# Patient Record
Sex: Female | Born: 1990 | Race: White | Hispanic: No | Marital: Married | Smoking: Former smoker
Health system: Southern US, Community
[De-identification: ages and names within clinical notes are randomized; demographics above are authoritative.]

## PROBLEM LIST (undated history)

## (undated) ENCOUNTER — Inpatient Hospital Stay (HOSPITAL_COMMUNITY): Payer: Self-pay

## (undated) ENCOUNTER — Inpatient Hospital Stay (HOSPITAL_COMMUNITY): Payer: BLUE CROSS/BLUE SHIELD

## (undated) DIAGNOSIS — E282 Polycystic ovarian syndrome: Secondary | ICD-10-CM

## (undated) DIAGNOSIS — Z9289 Personal history of other medical treatment: Secondary | ICD-10-CM

## (undated) DIAGNOSIS — L309 Dermatitis, unspecified: Secondary | ICD-10-CM

## (undated) DIAGNOSIS — D649 Anemia, unspecified: Secondary | ICD-10-CM

## (undated) DIAGNOSIS — K224 Dyskinesia of esophagus: Secondary | ICD-10-CM

## (undated) DIAGNOSIS — R140 Abdominal distension (gaseous): Secondary | ICD-10-CM

## (undated) DIAGNOSIS — K219 Gastro-esophageal reflux disease without esophagitis: Secondary | ICD-10-CM

## (undated) DIAGNOSIS — O1490 Unspecified pre-eclampsia, unspecified trimester: Secondary | ICD-10-CM

## (undated) DIAGNOSIS — J45909 Unspecified asthma, uncomplicated: Secondary | ICD-10-CM

## (undated) DIAGNOSIS — J302 Other seasonal allergic rhinitis: Secondary | ICD-10-CM

## (undated) HISTORY — DX: Gastro-esophageal reflux disease without esophagitis: K21.9

## (undated) HISTORY — PX: WISDOM TOOTH EXTRACTION: SHX21

## (undated) HISTORY — DX: Unspecified pre-eclampsia, unspecified trimester: O14.90

## (undated) HISTORY — DX: Other seasonal allergic rhinitis: J30.2

## (undated) HISTORY — DX: Personal history of other medical treatment: Z92.89

## (undated) HISTORY — PX: TYMPANOSTOMY TUBE PLACEMENT: SHX32

## (undated) HISTORY — DX: Dermatitis, unspecified: L30.9

## (undated) HISTORY — DX: Anemia, unspecified: D64.9

## (undated) HISTORY — DX: Dyskinesia of esophagus: K22.4

## (undated) HISTORY — DX: Abdominal distension (gaseous): R14.0

## (undated) HISTORY — DX: Unspecified asthma, uncomplicated: J45.909

---
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
## 2010-01-23 ENCOUNTER — Encounter: Admission: RE | Admit: 2010-01-23 | Discharge: 2010-01-23 | Payer: Self-pay | Admitting: General Surgery

## 2011-01-13 ENCOUNTER — Telehealth: Payer: Self-pay | Admitting: Family Medicine

## 2011-02-02 NOTE — Telephone Encounter (Signed)
error 

## 2011-02-11 ENCOUNTER — Encounter (INDEPENDENT_AMBULATORY_CARE_PROVIDER_SITE_OTHER): Payer: Self-pay

## 2011-04-13 ENCOUNTER — Ambulatory Visit (INDEPENDENT_AMBULATORY_CARE_PROVIDER_SITE_OTHER): Payer: Self-pay | Admitting: Internal Medicine

## 2011-07-01 ENCOUNTER — Encounter (INDEPENDENT_AMBULATORY_CARE_PROVIDER_SITE_OTHER): Payer: Self-pay | Admitting: Internal Medicine

## 2011-07-01 ENCOUNTER — Ambulatory Visit (INDEPENDENT_AMBULATORY_CARE_PROVIDER_SITE_OTHER): Payer: Medicaid Other | Admitting: Internal Medicine

## 2011-07-01 DIAGNOSIS — R1013 Epigastric pain: Secondary | ICD-10-CM

## 2011-07-01 DIAGNOSIS — J45909 Unspecified asthma, uncomplicated: Secondary | ICD-10-CM | POA: Insufficient documentation

## 2011-07-01 DIAGNOSIS — K219 Gastro-esophageal reflux disease without esophagitis: Secondary | ICD-10-CM | POA: Insufficient documentation

## 2011-07-01 DIAGNOSIS — J40 Bronchitis, not specified as acute or chronic: Secondary | ICD-10-CM

## 2011-07-01 NOTE — Progress Notes (Signed)
Subjective:     Patient ID: Mckenzie Cooley, female   DOB: December 18, 1990, 20 y.o.   MRN: [id number]  HPIShe c/o epigastric pain since December. She thinks it is her hernia. She tells me she hurt spontaneously.  Pain is not related to food. Appetite is good.  Weight loss of 6 pounds. No other pain. She has a BM about 1 a day. No melena or bright rectal bleeding. Her last EGD was in  11/2009 which reveal normal EGD.   Review of Systems see hpi     No current outpatient prescriptions on file.    Objective:   Physical Exam Filed Vitals:   07/01/11 1113  Height: 5' (1.524 m)  Weight: 221 lb 6.4 oz (100.426 kg)   Past Medical History  Diagnosis Date  . Acid reflux   . Bloating   . Weight loss, abnormal   . Asthma    Past Surgical History  Procedure Date  . Upper gastrointestinal endoscopy 12/05/2009  . Upper gastrointestinal endoscopy 02/07/2009  . Cholecystectomy    History   Social History  . Marital Status: Single    Spouse Name: N/A    Number of Children: N/A  . Years of Education: N/A   Occupational History  . Not on file.   Social History Main Topics  . Smoking status: Current Everyday Smoker  . Smokeless tobacco: Not on file   Comment: 1 pack a day  . Alcohol Use: No  . Drug Use: No  . Sexually Active: Not on file   Other Topics Concern  . Not on file   Social History Narrative  . No narrative on file   Family Status  Relation Status Death Age  . Mother Alive     good health  . Father Alive     good health  . Sister Alive     good health  . Brother Alive     good heatlh   Allergies  Allergen Reactions  . Percocet (Oxycodone-Acetaminophen)     Alert and oriented. Skin warm and dry. Oral mucosa is moist.   . Sclera anicteric, conjunctivae is pink. Thyroid not enlarged. No cervical lymphadenopathy. Lungs clear. Heart regular rate and rhythm.  Abdomen is soft. Bowel sounds are positive. No hepatomegaly. No abdominal masses felt. No tenderness.  No edema  to lower extremities. Patient is alert and oriented.      Assessment:    Epigastric pain not related to food intake.       Plan:   Weight loss. Prilosec OTC

## 2011-07-01 NOTE — Patient Instructions (Signed)
Weight loss. Prilosec OTC as needed. OV prn.

## 2012-10-26 ENCOUNTER — Emergency Department (HOSPITAL_COMMUNITY)
Admission: EM | Admit: 2012-10-26 | Discharge: 2012-10-26 | Disposition: A | Discharge status: Home / Self Care | Payer: Self-pay | Attending: Emergency Medicine | Admitting: Emergency Medicine

## 2012-10-26 ENCOUNTER — Emergency Department (HOSPITAL_COMMUNITY): Payer: Self-pay

## 2012-10-26 ENCOUNTER — Encounter (HOSPITAL_COMMUNITY): Payer: Self-pay | Admitting: *Deleted

## 2012-10-26 DIAGNOSIS — J45909 Unspecified asthma, uncomplicated: Secondary | ICD-10-CM | POA: Insufficient documentation

## 2012-10-26 DIAGNOSIS — F17200 Nicotine dependence, unspecified, uncomplicated: Secondary | ICD-10-CM | POA: Insufficient documentation

## 2012-10-26 DIAGNOSIS — M545 Low back pain, unspecified: Secondary | ICD-10-CM | POA: Insufficient documentation

## 2012-10-26 DIAGNOSIS — N12 Tubulo-interstitial nephritis, not specified as acute or chronic: Secondary | ICD-10-CM | POA: Insufficient documentation

## 2012-10-26 DIAGNOSIS — Z8742 Personal history of other diseases of the female genital tract: Secondary | ICD-10-CM | POA: Insufficient documentation

## 2012-10-26 DIAGNOSIS — N949 Unspecified condition associated with female genital organs and menstrual cycle: Secondary | ICD-10-CM | POA: Insufficient documentation

## 2012-10-26 DIAGNOSIS — Z8719 Personal history of other diseases of the digestive system: Secondary | ICD-10-CM | POA: Insufficient documentation

## 2012-10-26 DIAGNOSIS — Z3202 Encounter for pregnancy test, result negative: Secondary | ICD-10-CM | POA: Insufficient documentation

## 2012-10-26 DIAGNOSIS — R300 Dysuria: Secondary | ICD-10-CM | POA: Insufficient documentation

## 2012-10-26 DIAGNOSIS — R110 Nausea: Secondary | ICD-10-CM | POA: Insufficient documentation

## 2012-10-26 HISTORY — DX: Polycystic ovarian syndrome: E28.2

## 2012-10-26 LAB — URINALYSIS, ROUTINE W REFLEX MICROSCOPIC
Bilirubin Urine: NEGATIVE
Glucose, UA: NEGATIVE mg/dL
Ketones, ur: NEGATIVE mg/dL
pH: 7 (ref 5.0–8.0)

## 2012-10-26 LAB — URINE MICROSCOPIC-ADD ON

## 2012-10-26 LAB — PREGNANCY, URINE: Preg Test, Ur: NEGATIVE

## 2012-10-26 LAB — WET PREP, GENITAL: Trich, Wet Prep: NONE SEEN

## 2012-10-26 MED ORDER — ONDANSETRON 4 MG PO TBDP
4.0000 mg | ORAL_TABLET | Freq: Once | ORAL | Status: AC
  Administered 2012-10-26: 4 mg via ORAL
  Filled 2012-10-26: qty 1

## 2012-10-26 MED ORDER — HYDROCODONE-ACETAMINOPHEN 5-325 MG PO TABS: 2.0000 | ORAL_TABLET | ORAL | Status: DC | PRN

## 2012-10-26 MED ORDER — HYDROCODONE-ACETAMINOPHEN 5-325 MG PO TABS
2.0000 | ORAL_TABLET | Freq: Once | ORAL | Status: AC
  Administered 2012-10-26: 2 via ORAL
  Filled 2012-10-26: qty 2

## 2012-10-26 MED ORDER — CEFTRIAXONE SODIUM 1 G IJ SOLR
1.0000 g | Freq: Once | INTRAMUSCULAR | Status: AC
  Administered 2012-10-26: 1 g via INTRAMUSCULAR
  Filled 2012-10-26: qty 10

## 2012-10-26 MED ORDER — CEPHALEXIN 500 MG PO CAPS: 500.0000 mg | ORAL_CAPSULE | Freq: Four times a day (QID) | ORAL | Status: DC

## 2012-10-26 MED ORDER — ONDANSETRON HCL 4 MG PO TABS: 4.0000 mg | ORAL_TABLET | Freq: Four times a day (QID) | ORAL | Status: DC

## 2012-10-26 NOTE — ED Provider Notes (Signed)
History     CSN: [id number]  Arrival date & time 10/26/12  0100   First MD Initiated Contact with Patient 10/26/12 0126      Chief Complaint  Patient presents with  . Flank Pain    (Consider location/radiation/quality/duration/timing/severity/associated sxs/prior treatment) HPI Comments: Patient presents with left flank pain has been constant since yesterday. It radiates to the left side of her abdomen she is diffusely. Denies any nausea or vomiting. Denies any fever. Admits to some vaginal discharge but no bleeding. States last normal period was in March. Denies any history of kidney stones. No change in bowel habits. Good by mouth intake and urine output. Pain is worse with standing and better with lying down.  The history is provided by the patient.    Past Medical History  Diagnosis Date  . Acid reflux   . Bloating   . Weight loss, abnormal   . Asthma   . PCOS (polycystic ovarian syndrome)     Past Surgical History  Procedure Laterality Date  . Upper gastrointestinal endoscopy  12/05/2009  . Upper gastrointestinal endoscopy  02/07/2009  . Cholecystectomy      History reviewed. No pertinent family history.  History  Substance Use Topics  . Smoking status: Current Every Day Smoker -- 1.00 packs/day    Types: Cigarettes  . Smokeless tobacco: Not on file     Comment: 1 pack a day  . Alcohol Use: No    OB History   Grav Para Term Preterm Abortions TAB SAB Ect Mult Living                  Review of Systems  Constitutional: Negative for fever, activity change and appetite change.  HENT: Negative for congestion and rhinorrhea.   Respiratory: Negative for cough, chest tightness and shortness of breath.   Cardiovascular: Negative for chest pain.  Gastrointestinal: Positive for nausea and abdominal pain. Negative for vomiting, diarrhea and constipation.  Genitourinary: Positive for dysuria, flank pain and vaginal discharge. Negative for hematuria and vaginal  bleeding.  Musculoskeletal: Positive for back pain.  Skin: Negative for wound.  Neurological: Negative for dizziness, weakness and headaches.  A complete 10 system review of systems was obtained and all systems are negative except as noted in the HPI and PMH.    Allergies  Percocet  Home Medications   Current Outpatient Rx  Name  Route  Sig  Dispense  Refill  . cephALEXin (KEFLEX) 500 MG capsule   Oral   Take 1 capsule (500 mg total) by mouth 4 (four) times daily.   40 capsule   0   . HYDROcodone-acetaminophen (NORCO/VICODIN) 5-325 MG per tablet   Oral   Take 2 tablets by mouth every 4 (four) hours as needed for pain.   10 tablet   0   . ondansetron (ZOFRAN) 4 MG tablet   Oral   Take 1 tablet (4 mg total) by mouth every 6 (six) hours.   12 tablet   0     BP 132/75  Pulse 83  Temp(Src) 98.1 F (36.7 C) (Oral)  Resp 20  Ht 5' (1.524 m)  Wt 210 lb (95.255 kg)  BMI 41.01 kg/m2  SpO2 99%  LMP 09/19/2012  Physical Exam  Constitutional: She is oriented to person, place, and time. She appears well-developed and well-nourished. No distress.  HENT:  Head: Normocephalic and atraumatic.  Mouth/Throat: Oropharynx is clear and moist. No oropharyngeal exudate.  Eyes: Conjunctivae and EOM are normal. Pupils are  equal, round, and reactive to light.  Neck: Normal range of motion. Neck supple.  Cardiovascular: Normal rate, regular rhythm and normal heart sounds.   No murmur heard. Pulmonary/Chest: Effort normal and breath sounds normal. No respiratory distress.  Abdominal: Soft. There is tenderness.  Mild left lower quadrant pain without guarding or rebound  Genitourinary: Vaginal discharge found.  Scant white vaginal discharge. No cervical motion tenderness, no lateralizing adnexal tenderness  Musculoskeletal: Normal range of motion. She exhibits tenderness. She exhibits no edema.  Left CVA tenderness  Neurological: She is alert and oriented to person, place, and time. No  cranial nerve deficit. Coordination normal.  Skin: Skin is warm.    ED Course  Procedures (including critical care time)  Labs Reviewed  URINALYSIS, ROUTINE W REFLEX MICROSCOPIC - Abnormal; Notable for the following:    Hgb urine dipstick MODERATE (*)    Protein, ur TRACE (*)    Nitrite POSITIVE (*)    Leukocytes, UA SMALL (*)    All other components within normal limits  URINE MICROSCOPIC-ADD ON - Abnormal; Notable for the following:    Squamous Epithelial / LPF MANY (*)    Bacteria, UA MANY (*)    All other components within normal limits  WET PREP, GENITAL  GC/CHLAMYDIA PROBE AMP  URINE CULTURE  PREGNANCY, URINE   Ct Abdomen Pelvis Wo Contrast  10/26/2012  *RADIOLOGY REPORT*  Clinical Data: 22 year old female with flank pain on the left. Negative urine pregnancy test.  CT ABDOMEN AND PELVIS WITHOUT CONTRAST  Technique:  Multidetector CT imaging of the abdomen and pelvis was performed following the standard protocol without intravenous contrast.  Comparison: [facility] CT abdomen and pelvis  04/24/2012.  Findings: Negative lung bases. No acute osseous abnormality identified.  In the left posterior flank soft tissues there is abnormal stranding and gas (series 2 image 51).  This is in the subcutaneous fat.  Query recent penetrating trauma or injection at this site. Other visualized superficial soft tissues appear within normal limits.  No pelvic free fluid.  Negative noncontrast uterus and adnexa. Decompressed distal colon.  Negative left colon.  Negative transverse and right colon.  Normal appendix.  No dilated small bowel.  Negative noncontrast stomach, duodenum, liver, spleen, pancreas and adrenal glands. Gallbladder is surgically absent.  Negative right kidney.  Negative course of the right ureter.  No left hydronephrosis, perinephric stranding or nephrolithiasis. Negative course of the left ureter.  The bladder is decompressed. Small left perimetrial phleboliths are stable.  No  abdominal free fluid.  IMPRESSION: 1.  Stranding and gas in the left flank subcutaneous soft tissues. Query recent penetrating trauma or injection. 2.  No urologic calculus or obstructive uropathy.  No acute or inflammatory findings identified within the abdomen or pelvis.   Original Report Authenticated By: Erskine Speed, M.D.      1. Pyelonephritis       MDM  Left flank pain for the past one day with urinary symptoms. Vitals stable. Abdomen soft without peritoneal signs.  Pelvic exam benign. Urinalysis positive for infection. Patient treated for pyelonephritis with IM Rocephin.  No evidence of kidney stone. Appendix normal.  Tolerating by mouth in ED without vomiting. We'll treat pyelonephritis with antibiotics, pain medications. Culture sent. Return precautions discussed.    Glynn Octave, MD 10/26/12 [phone]

## 2012-10-26 NOTE — ED Notes (Signed)
Pt reporting pain in left flank.  States that she thinks it may be a kidney stone.  Pt has no history of prior stones.  Reporting difficulty with urination, no relief from taking AZO for 2 days.

## 2012-10-27 LAB — GC/CHLAMYDIA PROBE AMP
CT Probe RNA: NEGATIVE
GC Probe RNA: NEGATIVE

## 2012-10-28 LAB — URINE CULTURE

## 2012-10-29 ENCOUNTER — Telehealth (HOSPITAL_COMMUNITY): Payer: Self-pay | Admitting: Emergency Medicine

## 2012-10-29 NOTE — ED Notes (Signed)
Post ED Visit - Positive Culture Follow-up  Culture report reviewed by antimicrobial stewardship pharmacist: []  Wes Dulaney, Pharm.D., BCPS []  [name], Pharm.D., BCPS [x]  [name], Pharm.D., BCPS []  [place], [place].D., BCPS, AAHIVP []  [name], Pharm.D., BCPS, AAHIV  Positive urine culture Treated with Keflex, organism sensitive to the same and no further patient follow-up is required at this time.  Mckenzie Cooley 10/29/2012, 5:23 PM

## 2013-03-15 ENCOUNTER — Emergency Department (HOSPITAL_COMMUNITY): Payer: Medicaid Other

## 2013-03-15 ENCOUNTER — Encounter (HOSPITAL_COMMUNITY): Payer: Self-pay | Admitting: Emergency Medicine

## 2013-03-15 ENCOUNTER — Emergency Department (HOSPITAL_COMMUNITY)
Admission: EM | Admit: 2013-03-15 | Discharge: 2013-03-15 | Disposition: A | Discharge status: Home / Self Care | Payer: Medicaid Other | Attending: Emergency Medicine | Admitting: Emergency Medicine

## 2013-03-15 DIAGNOSIS — J209 Acute bronchitis, unspecified: Secondary | ICD-10-CM

## 2013-03-15 DIAGNOSIS — J3489 Other specified disorders of nose and nasal sinuses: Secondary | ICD-10-CM | POA: Insufficient documentation

## 2013-03-15 DIAGNOSIS — Z79899 Other long term (current) drug therapy: Secondary | ICD-10-CM | POA: Insufficient documentation

## 2013-03-15 DIAGNOSIS — Z8639 Personal history of other endocrine, nutritional and metabolic disease: Secondary | ICD-10-CM | POA: Insufficient documentation

## 2013-03-15 DIAGNOSIS — F17200 Nicotine dependence, unspecified, uncomplicated: Secondary | ICD-10-CM | POA: Insufficient documentation

## 2013-03-15 DIAGNOSIS — IMO0002 Reserved for concepts with insufficient information to code with codable children: Secondary | ICD-10-CM | POA: Insufficient documentation

## 2013-03-15 DIAGNOSIS — Z862 Personal history of diseases of the blood and blood-forming organs and certain disorders involving the immune mechanism: Secondary | ICD-10-CM | POA: Insufficient documentation

## 2013-03-15 DIAGNOSIS — Z3202 Encounter for pregnancy test, result negative: Secondary | ICD-10-CM | POA: Insufficient documentation

## 2013-03-15 DIAGNOSIS — Z8719 Personal history of other diseases of the digestive system: Secondary | ICD-10-CM | POA: Insufficient documentation

## 2013-03-15 DIAGNOSIS — J45901 Unspecified asthma with (acute) exacerbation: Secondary | ICD-10-CM | POA: Insufficient documentation

## 2013-03-15 MED ORDER — ALBUTEROL SULFATE HFA 108 (90 BASE) MCG/ACT IN AERS: 1.0000 | INHALATION_SPRAY | Freq: Four times a day (QID) | RESPIRATORY_TRACT | Status: DC | PRN

## 2013-03-15 MED ORDER — LORATADINE-PSEUDOEPHEDRINE ER 10-240 MG PO TB24: 1.0000 | ORAL_TABLET | Freq: Every day | ORAL | Status: DC

## 2013-03-15 MED ORDER — ALBUTEROL SULFATE (5 MG/ML) 0.5% IN NEBU
2.5000 mg | INHALATION_SOLUTION | Freq: Once | RESPIRATORY_TRACT | Status: AC
  Administered 2013-03-15: 2.5 mg via RESPIRATORY_TRACT
  Filled 2013-03-15: qty 0.5

## 2013-03-15 MED ORDER — PREDNISONE 10 MG PO TABS: ORAL_TABLET | ORAL | Status: DC

## 2013-03-15 NOTE — ED Notes (Signed)
Pt c/o cough, onset 3 days ago. Productive cough with greenish, thick sputum. Pt reports onset of wheezing 2 days ago.

## 2013-03-18 NOTE — ED Provider Notes (Signed)
CSN: [id number]     Arrival date & time 03/15/13  1127 History   First MD Initiated Contact with Patient 03/15/13 1146     Chief Complaint  Patient presents with  . Cough   (Consider location/radiation/quality/duration/timing/severity/associated sxs/prior Treatment) HPI Comments: Mckenzie Cooley is a 22 y.o. Female presenting with a 3 day history of uri type symptoms which includes nasal congestion with clear rhinorrhea, cough with productive green sputum, with the development of wheezing 2 days ago.  Symptoms due to not include chest pain, weakness, nausea, vomiting or diarrhea.  The patient has run out of her albuterol mdi which she uses prn for her chronic intermittent asthma.  She denies fever but has taken ibuprofen for achiness, the last dose taken this morning.  The history is provided by the patient.    Past Medical History  Diagnosis Date  . Acid reflux   . Bloating   . Weight loss, abnormal   . Asthma   . PCOS (polycystic ovarian syndrome)    Past Surgical History  Procedure Laterality Date  . Upper gastrointestinal endoscopy  12/05/2009  . Upper gastrointestinal endoscopy  02/07/2009  . Cholecystectomy    . Wisdom tooth extraction     Family History  Problem Relation Age of Onset  . Renal Disease Other    History  Substance Use Topics  . Smoking status: Current Every Day Smoker -- 1.00 packs/day    Types: Cigarettes  . Smokeless tobacco: Not on file     Comment: 1 pack a day  . Alcohol Use: No   OB History   Grav Para Term Preterm Abortions TAB SAB Ect Mult Living                 Review of Systems  Constitutional: Negative for fever and chills.  HENT: Positive for congestion and rhinorrhea. Negative for sore throat, neck pain and sinus pressure.   Eyes: Negative.   Respiratory: Positive for cough, shortness of breath and wheezing. Negative for chest tightness.   Cardiovascular: Negative for chest pain.  Gastrointestinal: Negative for nausea and abdominal  pain.  Genitourinary: Negative.   Musculoskeletal: Negative for joint swelling and arthralgias.  Skin: Negative.  Negative for rash and wound.  Neurological: Negative for dizziness, weakness, light-headedness, numbness and headaches.  Psychiatric/Behavioral: Negative.     Allergies  Percocet  Home Medications   Current Outpatient Rx  Name  Route  Sig  Dispense  Refill  . ibuprofen (ADVIL,MOTRIN) 200 MG tablet   Oral   Take 600 mg by mouth every 6 (six) hours as needed for pain.         Marland Kitchen albuterol (PROVENTIL HFA;VENTOLIN HFA) 108 (90 BASE) MCG/ACT inhaler   Inhalation   Inhale 2 puffs into the lungs every 6 (six) hours as needed for wheezing.         Marland Kitchen albuterol (PROVENTIL HFA;VENTOLIN HFA) 108 (90 BASE) MCG/ACT inhaler   Inhalation   Inhale 1-2 puffs into the lungs every 6 (six) hours as needed for wheezing.   1 Inhaler   0   . loratadine-pseudoephedrine (CLARITIN-D 24 HOUR) 10-240 MG per 24 hr tablet   Oral   Take 1 tablet by mouth daily.   20 tablet   0   . predniSONE (DELTASONE) 10 MG tablet      6, 5, 4, 3, 2 then 1 tablet by mouth daily for 6 days total.   21 tablet   0    BP 143/79  Pulse  58  Temp(Src) 98 F (36.7 C) (Oral)  Resp 20  Ht 5' (1.524 m)  Wt 218 lb (98.884 kg)  BMI 42.58 kg/m2  SpO2 98%  LMP 03/10/2013 Physical Exam  Constitutional: She is oriented to person, place, and time. She appears well-developed and well-nourished.  HENT:  Head: Normocephalic and atraumatic.  Right Ear: Tympanic membrane and ear canal normal.  Left Ear: Tympanic membrane and ear canal normal.  Nose: Mucosal edema and rhinorrhea present.  Mouth/Throat: Uvula is midline, oropharynx is clear and moist and mucous membranes are normal. No oropharyngeal exudate, posterior oropharyngeal edema, posterior oropharyngeal erythema or tonsillar abscesses.  Eyes: Conjunctivae are normal.  Cardiovascular: Normal rate and normal heart sounds.   Pulmonary/Chest: Effort  normal. No respiratory distress. She has no decreased breath sounds. She has no wheezes. She has no rhonchi. She has no rales. She exhibits no tenderness.  Coarse breath sounds, no wheeze at present.  Abdominal: Soft. There is no tenderness.  Musculoskeletal: Normal range of motion.  Neurological: She is alert and oriented to person, place, and time.  Skin: Skin is warm and dry. No rash noted.  Psychiatric: She has a normal mood and affect.    ED Course  Procedures (including critical care time) Labs Review Labs Reviewed  POCT PREGNANCY, URINE   Imaging Review No results found.  MDM   1. Bronchitis with bronchospasm    Pt prescribed prednisone taper, claritin d for upper resp sx,  Given albuterol mdi for q 4 hours use prn wheeze or cough.  F/u with pcp or return if sx worsen.  The patient appears reasonably screened and/or stabilized for discharge and I doubt any other medical condition or other [facility] requiring further screening, evaluation, or treatment in the ED at this time prior to discharge.  Patients labs and/or radiological studies were viewed and considered during the medical decision making and disposition process.     Burgess Amor, PA-C 03/18/13 1217

## 2013-03-20 NOTE — ED Provider Notes (Signed)
Medical screening examination/treatment/procedure(s) were performed by non-physician practitioner and as supervising physician I was immediately available for consultation/collaboration.  Donnetta Hutching, MD 03/20/13 1650

## 2013-09-09 ENCOUNTER — Emergency Department (HOSPITAL_COMMUNITY)
Admission: EM | Admit: 2013-09-09 | Discharge: 2013-09-09 | Disposition: A | Discharge status: Home / Self Care | Payer: BC Managed Care – PPO | Attending: Emergency Medicine | Admitting: Emergency Medicine

## 2013-09-09 ENCOUNTER — Encounter (HOSPITAL_COMMUNITY): Payer: Self-pay | Admitting: Emergency Medicine

## 2013-09-09 DIAGNOSIS — F17200 Nicotine dependence, unspecified, uncomplicated: Secondary | ICD-10-CM | POA: Insufficient documentation

## 2013-09-09 DIAGNOSIS — Z8742 Personal history of other diseases of the female genital tract: Secondary | ICD-10-CM | POA: Insufficient documentation

## 2013-09-09 DIAGNOSIS — Z79899 Other long term (current) drug therapy: Secondary | ICD-10-CM | POA: Insufficient documentation

## 2013-09-09 DIAGNOSIS — R634 Abnormal weight loss: Secondary | ICD-10-CM | POA: Insufficient documentation

## 2013-09-09 DIAGNOSIS — K1120 Sialoadenitis, unspecified: Secondary | ICD-10-CM | POA: Insufficient documentation

## 2013-09-09 DIAGNOSIS — J45909 Unspecified asthma, uncomplicated: Secondary | ICD-10-CM | POA: Insufficient documentation

## 2013-09-09 DIAGNOSIS — IMO0002 Reserved for concepts with insufficient information to code with codable children: Secondary | ICD-10-CM | POA: Insufficient documentation

## 2013-09-09 MED ORDER — CEPHALEXIN 500 MG PO CAPS: 500.0000 mg | ORAL_CAPSULE | Freq: Four times a day (QID) | ORAL | Status: DC

## 2013-09-09 MED ORDER — HYDROCODONE-ACETAMINOPHEN 5-325 MG PO TABS: 1.0000 | ORAL_TABLET | ORAL | Status: DC | PRN

## 2013-09-09 MED ORDER — HYDROCODONE-ACETAMINOPHEN 5-325 MG PO TABS
1.0000 | ORAL_TABLET | Freq: Once | ORAL | Status: AC
Start: 2013-09-09 — End: 2013-09-09
  Administered 2013-09-09: 1 via ORAL
  Filled 2013-09-09: qty 1

## 2013-09-09 MED ORDER — CEPHALEXIN 500 MG PO CAPS
500.0000 mg | ORAL_CAPSULE | Freq: Once | ORAL | Status: AC
  Administered 2013-09-09: 500 mg via ORAL
  Filled 2013-09-09: qty 1

## 2013-09-09 NOTE — ED Provider Notes (Signed)
CSN: [id number]     Arrival date & time 09/09/13  0421 History   First MD Initiated Contact with Patient 09/09/13 [phone]     Chief Complaint  Patient presents with  . Facial Swelling      The history is provided by the patient.  pt presents with facial swelling.  The left side of her face began to swell about 30 minutes prior to arrival.  It is worsening.  Nothing improves the swelling.  It is worse with palpation.  She has never had this before  She had otherwise felt well until this started 30 minutes ago  No fever/vomiting.  No HA.  She does report pain with swallowing She denies dental pain   Past Medical History  Diagnosis Date  . Acid reflux   . Bloating   . Weight loss, abnormal   . Asthma   . PCOS (polycystic ovarian syndrome)    Past Surgical History  Procedure Laterality Date  . Upper gastrointestinal endoscopy  12/05/2009  . Upper gastrointestinal endoscopy  02/07/2009  . Cholecystectomy    . Wisdom tooth extraction     Family History  Problem Relation Age of Onset  . Renal Disease Other    History  Substance Use Topics  . Smoking status: Current Every Day Smoker -- 1.00 packs/day    Types: Cigarettes  . Smokeless tobacco: Not on file     Comment: 1 pack a day  . Alcohol Use: No   OB History   Grav Para Term Preterm Abortions TAB SAB Ect Mult Living                 Review of Systems  Constitutional: Negative for fever.  HENT: Positive for facial swelling. Negative for drooling.       Allergies  Percocet  Home Medications   Current Outpatient Rx  Name  Route  Sig  Dispense  Refill  . albuterol (PROVENTIL HFA;VENTOLIN HFA) 108 (90 BASE) MCG/ACT inhaler   Inhalation   Inhale 2 puffs into the lungs every 6 (six) hours as needed for wheezing.         Marland Kitchen albuterol (PROVENTIL HFA;VENTOLIN HFA) 108 (90 BASE) MCG/ACT inhaler   Inhalation   Inhale 1-2 puffs into the lungs every 6 (six) hours as needed for wheezing.   1 Inhaler   0   .  cephALEXin (KEFLEX) 500 MG capsule   Oral   Take 1 capsule (500 mg total) by mouth 4 (four) times daily.   40 capsule   0   . HYDROcodone-acetaminophen (NORCO/VICODIN) 5-325 MG per tablet   Oral   Take 1 tablet by mouth every 4 (four) hours as needed.   10 tablet   0   . ibuprofen (ADVIL,MOTRIN) 200 MG tablet   Oral   Take 600 mg by mouth every 6 (six) hours as needed for pain.         Marland Kitchen loratadine-pseudoephedrine (CLARITIN-D 24 HOUR) 10-240 MG per 24 hr tablet   Oral   Take 1 tablet by mouth daily.   20 tablet   0   . predniSONE (DELTASONE) 10 MG tablet      6, 5, 4, 3, 2 then 1 tablet by mouth daily for 6 days total.   21 tablet   0    BP 140/75  Pulse 86  Temp(Src) 98.2 F (36.8 C) (Oral)  Resp 17  Ht 5' (1.524 m)  Wt 235 lb (106.595 kg)  BMI 45.90 kg/m2  SpO2  98%  LMP 08/30/2013 Physical Exam CONSTITUTIONAL: Well developed/well nourished HEAD: Normocephalic/atraumatic EYES: EOMI/PERRL ENMT: Mucous membranes moist. Mild swelling noted in front of left ear.  There is no overlying erythema or fluctuance No trismus.  Uvula midline without edema/erythema/exudates.  Normal phonation. No stridor or drooling. Left TM intact.  There is no mastoid tenderness.  Ears symmetric bilaterally NECK: supple no meningeal signs. No anterior neck swelling noted CV: S1/S2 noted, no murmurs/rubs/gallops noted LUNGS: Lungs are clear to auscultation bilaterally, no apparent distress ABDOMEN: soft, nontender, no rebound or guarding NEURO: Pt is awake/alert, moves all extremitiesx4 No facial droop Tongue is midline No ptosis is noted She is ambulatory without ataxia EXTREMITIES: pulses normal, full ROM SKIN: warm, color normal PSYCH: no abnormalities of mood noted   ED Course  Procedures   Suspect uncomplicated parotitis She is well appearing Pain control, antibiotics We discussed strict return precautions Stable for d/c home  MDM   Final diagnoses:  Parotitis     Nursing notes including past medical history and social history reviewed and considered in documentation     Sharyon Cable, MD 09/09/13 [phone]

## 2013-09-09 NOTE — Discharge Instructions (Signed)
Parotitis °Parotitis is soreness and swelling (inflammation) of one or both parotid glands. The parotid glands produce saliva. They are located on each side of the face, below and in front of the earlobes. The saliva produced comes out of tiny openings (ducts) inside the cheeks. In most cases, parotitis goes away over time or with treatment. If your parotitis is caused by certain long-term (chronic) diseases, it may come back again.  °CAUSES  °Parotitis can be caused by: °· Viral infections. Mumps is one viral infection that can cause parotitis. °· Bacterial infections. °· Blockage of the salivary ducts due to a salivary stone. °· Narrowing of the salivary ducts. °· Swelling of the salivary ducts. °· Dehydration. °· Autoimmune conditions, such as sarcoidosis or Sjogren's syndrome. °· Air from activities such as scuba diving, glass blowing, or playing an instrument (rare). °· Human immunodeficiency virus (HIV) or acquired immunodeficiency syndrome (AIDS). °· Tuberculosis. °SYMPTOMS  °· The ears may appear to be pushed up and out from their normal position. °· Redness (erythema) of the skin over the parotid glands. °· Pain and tenderness over the parotid glands. °· Swelling in the parotid gland area. °· Yellowish-white fluid (pus) coming from the ducts inside the cheeks. °· Dry mouth. °· Bad taste in the mouth. °DIAGNOSIS  °Your caregiver may determine that you have parotitis based on your symptoms and a physical exam. A sample of fluid may also be taken from the parotid gland and tested to find the cause of your infection. X-rays or computed tomography (CT) scans may be taken if your caregiver thinks you might have a salivary stone blocking your salivary duct. °TREATMENT  °Treatment varies depending upon the cause of your parotitis. If your parotitis is caused by mumps, no treatment is needed. The condition will go away on its own after 7 to 10 days. In other cases, treatment may include: °· Antibiotics if your  infection was caused by bacteria. °· Pain medicines. °· Gland massage. °· Eating sour candy to increase your saliva production. °· Removal of salivary stones. Your caregiver may flush stones out with fluids or remove them with tweezers. °· Surgery to remove the parotid glands. °HOME CARE INSTRUCTIONS  °· If you were given antibiotics, take them as directed. Finish them even if you start to feel better. °· Put warm compresses on the sore area. °· Only take over-the-counter or prescription medicines for pain, discomfort, or fever as directed by your caregiver. °· Drink enough fluids to keep your urine clear or pale yellow. °SEEK IMMEDIATE MEDICAL CARE IF:  °· You have increasing pain or swelling that is not controlled with medicine. °· You have a fever. °MAKE SURE YOU: °· Understand these instructions. °· Will watch your condition. °· Will get help right away if you are not doing well or get worse. °Document Released: 11/28/2001 Document Revised: 08/31/2011 Document Reviewed: 05/04/2011 °[employer]® Patient Information ©2014 [employer]. ° °

## 2013-09-09 NOTE — ED Notes (Signed)
Stated that she noticed her face swelling on L.. Side. Stated it became "real noticeable" after about 20 mins. States it is painful and she can't eat or drink because of it.

## 2014-01-11 ENCOUNTER — Emergency Department (HOSPITAL_COMMUNITY)
Admission: EM | Admit: 2014-01-11 | Discharge: 2014-01-11 | Disposition: A | Discharge status: Home / Self Care | Payer: BC Managed Care – PPO | Attending: Emergency Medicine | Admitting: Emergency Medicine

## 2014-01-11 ENCOUNTER — Encounter (HOSPITAL_COMMUNITY): Payer: Self-pay | Admitting: Emergency Medicine

## 2014-01-11 DIAGNOSIS — Z87891 Personal history of nicotine dependence: Secondary | ICD-10-CM | POA: Insufficient documentation

## 2014-01-11 DIAGNOSIS — J45909 Unspecified asthma, uncomplicated: Secondary | ICD-10-CM | POA: Insufficient documentation

## 2014-01-11 DIAGNOSIS — Z792 Long term (current) use of antibiotics: Secondary | ICD-10-CM | POA: Insufficient documentation

## 2014-01-11 DIAGNOSIS — H60399 Other infective otitis externa, unspecified ear: Secondary | ICD-10-CM | POA: Insufficient documentation

## 2014-01-11 DIAGNOSIS — Z8639 Personal history of other endocrine, nutritional and metabolic disease: Secondary | ICD-10-CM | POA: Insufficient documentation

## 2014-01-11 DIAGNOSIS — H6091 Unspecified otitis externa, right ear: Secondary | ICD-10-CM

## 2014-01-11 DIAGNOSIS — IMO0002 Reserved for concepts with insufficient information to code with codable children: Secondary | ICD-10-CM | POA: Insufficient documentation

## 2014-01-11 DIAGNOSIS — Z862 Personal history of diseases of the blood and blood-forming organs and certain disorders involving the immune mechanism: Secondary | ICD-10-CM | POA: Insufficient documentation

## 2014-01-11 DIAGNOSIS — Z79899 Other long term (current) drug therapy: Secondary | ICD-10-CM | POA: Insufficient documentation

## 2014-01-11 MED ORDER — NEOMYCIN-POLYMYXIN-HC 1 % OT SOLN
4.0000 [drp] | Freq: Once | OTIC | Status: AC
  Administered 2014-01-11: 4 [drp] via OTIC
  Filled 2014-01-11: qty 10

## 2014-01-11 MED ORDER — HYDROCODONE-ACETAMINOPHEN 5-325 MG PO TABS: ORAL_TABLET | ORAL | Status: DC

## 2014-01-11 MED ORDER — AMOXICILLIN 500 MG PO CAPS: 500.0000 mg | ORAL_CAPSULE | Freq: Three times a day (TID) | ORAL | Status: DC

## 2014-01-11 MED ORDER — HYDROCODONE-ACETAMINOPHEN 5-325 MG PO TABS
1.0000 | ORAL_TABLET | Freq: Once | ORAL | Status: AC
  Administered 2014-01-11: 1 via ORAL
  Filled 2014-01-11: qty 1

## 2014-01-11 NOTE — ED Provider Notes (Signed)
CSN: [id number]     Arrival date & time 01/11/14  2238 History   First MD Initiated Contact with Patient 01/11/14 2259     Chief Complaint  Patient presents with  . Otalgia     (Consider location/radiation/quality/duration/timing/severity/associated sxs/prior Treatment)   Mckenzie Cooley is a 23 y.o. female who presents to the Emergency Department complaining of right ear pain for one day.  Pain began after swimming several days ago.  She describes the pain as sharp and stabbing.  Patient is a 23 y.o. female presenting with ear pain. The history is provided by the patient.  Otalgia Location:  Right Behind ear:  No abnormality Quality:  Sharp and shooting Severity:  Moderate Onset quality:  Gradual Duration:  1 day Timing:  Constant Progression:  Unchanged Chronicity:  New Context: water   Relieved by:  Nothing Worsened by:  Nothing tried Ineffective treatments: heat and ibuprofen. Associated symptoms: ear discharge and hearing loss   Associated symptoms: no congestion, no cough, no diarrhea, no fever, no headaches, no neck pain, no rash, no rhinorrhea, no sore throat, no tinnitus and no vomiting     Past Medical History  Diagnosis Date  . Acid reflux   . Bloating   . Weight loss, abnormal   . Asthma   . PCOS (polycystic ovarian syndrome)    Past Surgical History  Procedure Laterality Date  . Upper gastrointestinal endoscopy  12/05/2009  . Upper gastrointestinal endoscopy  02/07/2009  . Cholecystectomy    . Wisdom tooth extraction     Family History  Problem Relation Age of Onset  . Renal Disease Other    History  Substance Use Topics  . Smoking status: Former Smoker -- 1.00 packs/day    Types: Cigarettes  . Smokeless tobacco: Not on file     Comment: 1 pack a day  . Alcohol Use: No   OB History   Grav Para Term Preterm Abortions TAB SAB Ect Mult Living                 Review of Systems  Constitutional: Negative for fever, activity change and appetite  change.  HENT: Positive for ear discharge, ear pain and hearing loss. Negative for congestion, facial swelling, rhinorrhea, sore throat, tinnitus, trouble swallowing and voice change.   Eyes: Negative for visual disturbance.  Respiratory: Negative for cough.   Gastrointestinal: Negative for vomiting and diarrhea.  Musculoskeletal: Negative for neck pain.  Skin: Negative for rash.  Neurological: Negative for dizziness, syncope, facial asymmetry and headaches.  Hematological: Negative for adenopathy.  All other systems reviewed and are negative.     Allergies  Percocet  Home Medications   Prior to Admission medications   Medication Sig Start Date End Date Taking? Authorizing Provider  albuterol (PROVENTIL HFA;VENTOLIN HFA) 108 (90 BASE) MCG/ACT inhaler Inhale 2 puffs into the lungs every 6 (six) hours as needed for wheezing.    Historical Provider, MD  albuterol (PROVENTIL HFA;VENTOLIN HFA) 108 (90 BASE) MCG/ACT inhaler Inhale 1-2 puffs into the lungs every 6 (six) hours as needed for wheezing. 03/15/13   Evalee Jefferson, PA-C  amoxicillin (AMOXIL) 500 MG capsule Take 1 capsule (500 mg total) by mouth 3 (three) times daily. For 10 days 01/11/14   Legend Tumminello L. Elmyra Banwart, PA-C  cephALEXin (KEFLEX) 500 MG capsule Take 1 capsule (500 mg total) by mouth 4 (four) times daily. 09/09/13   Sharyon Cable, MD  HYDROcodone-acetaminophen (NORCO/VICODIN) 5-325 MG per tablet Take 1 tablet by mouth every  4 (four) hours as needed. 09/09/13   Sharyon Cable, MD  HYDROcodone-acetaminophen (NORCO/VICODIN) 5-325 MG per tablet Take one-two tabs po q 4-6 hrs prn pain 01/11/14   Markan Cazarez L. Cayenne Breault, PA-C  ibuprofen (ADVIL,MOTRIN) 200 MG tablet Take 600 mg by mouth every 6 (six) hours as needed for pain.    Historical Provider, MD  loratadine-pseudoephedrine (CLARITIN-D 24 HOUR) 10-240 MG per 24 hr tablet Take 1 tablet by mouth daily. 03/15/13   Evalee Jefferson, PA-C  predniSONE (DELTASONE) 10 MG tablet 6, 5, 4, 3, 2 then 1  tablet by mouth daily for 6 days total. 03/15/13   Evalee Jefferson, PA-C   BP 124/80  Pulse 73  Temp(Src) 98.3 F (36.8 C) (Oral)  Resp 18  Ht 5' (1.524 m)  Wt 236 lb (107.049 kg)  BMI 46.09 kg/m2  SpO2 100%  LMP 01/11/2014 Physical Exam  Nursing note and vitals reviewed. Constitutional: She is oriented to person, place, and time. She appears well-developed and well-nourished. No distress.  Patient appears uncomfortable.    HENT:  Head: Normocephalic and atraumatic.  Right Ear: There is drainage and tenderness. No swelling. No foreign bodies. No mastoid tenderness. No hemotympanum.  Left Ear: Tympanic membrane and ear canal normal.  Mouth/Throat: Uvula is midline, oropharynx is clear and moist and mucous membranes are normal.  Right TM is not well visualized, but appears slightly erythematous.  No obvious perforation  Neck: Normal range of motion. Neck supple.  Cardiovascular: Normal rate, regular rhythm, normal heart sounds and intact distal pulses.   No murmur heard. Pulmonary/Chest: Effort normal and breath sounds normal. No respiratory distress.  Musculoskeletal: Normal range of motion.  Lymphadenopathy:    She has no cervical adenopathy.  Neurological: She is alert and oriented to person, place, and time. She exhibits normal muscle tone. Coordination normal.  Skin: Skin is warm and dry.    ED Course  Procedures (including critical care time) Labs Review Labs Reviewed - No data to display  Imaging Review No results found.   EKG Interpretation None      MDM   Final diagnoses:  Otitis externa, right   Patient is well appearing.  VSS.  Cortisporin otic drops dispensed.  Pt agrees to close f/u with her PMD for recheck.  Rx for amoxil and #10 vicodin .  She appears stable for discharge.       Ashlynn Gunnels L. Vanessa Hanover, PA-C 01/11/14 2356

## 2014-01-11 NOTE — ED Notes (Signed)
Patient complaining of right ear pain since yesterday.

## 2014-01-11 NOTE — Discharge Instructions (Signed)

## 2014-01-11 NOTE — ED Notes (Signed)
Patient states right ear pain X2days. Patient states she has tried "everything" with no relief. Patient states pain going into her right jaw, neck, and teeth. Patient describes pain as a "throbbing" pain and sounds like "bubbles" in her ear. Patient states slight loss of hearing to right ear due to current ear pain.

## 2014-01-11 NOTE — ED Notes (Signed)
Patient verbalizes understanding of discharge instructions, medications, home care, and follow up care. Patient ambulatory out of department at this time escorted by family member.

## 2014-01-13 NOTE — ED Provider Notes (Signed)
Medical screening examination/treatment/procedure(s) were performed by non-physician practitioner and as supervising physician I was immediately available for consultation/collaboration.   EKG Interpretation None       Virgel Manifold, MD 01/13/14 [phone]

## 2014-01-17 ENCOUNTER — Encounter (HOSPITAL_COMMUNITY): Payer: Self-pay | Admitting: Emergency Medicine

## 2014-01-17 ENCOUNTER — Emergency Department (HOSPITAL_COMMUNITY)
Admission: EM | Admit: 2014-01-17 | Discharge: 2014-01-17 | Disposition: A | Discharge status: Home / Self Care | Payer: BC Managed Care – PPO | Attending: Emergency Medicine | Admitting: Emergency Medicine

## 2014-01-17 DIAGNOSIS — Z792 Long term (current) use of antibiotics: Secondary | ICD-10-CM | POA: Insufficient documentation

## 2014-01-17 DIAGNOSIS — L270 Generalized skin eruption due to drugs and medicaments taken internally: Secondary | ICD-10-CM

## 2014-01-17 DIAGNOSIS — R21 Rash and other nonspecific skin eruption: Secondary | ICD-10-CM | POA: Insufficient documentation

## 2014-01-17 DIAGNOSIS — Z8719 Personal history of other diseases of the digestive system: Secondary | ICD-10-CM | POA: Insufficient documentation

## 2014-01-17 DIAGNOSIS — H60399 Other infective otitis externa, unspecified ear: Secondary | ICD-10-CM | POA: Insufficient documentation

## 2014-01-17 DIAGNOSIS — Z8639 Personal history of other endocrine, nutritional and metabolic disease: Secondary | ICD-10-CM | POA: Insufficient documentation

## 2014-01-17 DIAGNOSIS — Z87891 Personal history of nicotine dependence: Secondary | ICD-10-CM | POA: Insufficient documentation

## 2014-01-17 DIAGNOSIS — IMO0002 Reserved for concepts with insufficient information to code with codable children: Secondary | ICD-10-CM | POA: Insufficient documentation

## 2014-01-17 DIAGNOSIS — T360X5A Adverse effect of penicillins, initial encounter: Secondary | ICD-10-CM | POA: Insufficient documentation

## 2014-01-17 DIAGNOSIS — H6091 Unspecified otitis externa, right ear: Secondary | ICD-10-CM

## 2014-01-17 DIAGNOSIS — Z862 Personal history of diseases of the blood and blood-forming organs and certain disorders involving the immune mechanism: Secondary | ICD-10-CM | POA: Insufficient documentation

## 2014-01-17 DIAGNOSIS — Z79899 Other long term (current) drug therapy: Secondary | ICD-10-CM | POA: Insufficient documentation

## 2014-01-17 MED ORDER — PREDNISONE 50 MG PO TABS: 50.0000 mg | ORAL_TABLET | Freq: Every day | ORAL | Status: DC

## 2014-01-17 MED ORDER — PREDNISONE 50 MG PO TABS
60.0000 mg | ORAL_TABLET | Freq: Once | ORAL | Status: AC
  Administered 2014-01-17: 60 mg via ORAL
  Filled 2014-01-17 (×2): qty 1

## 2014-01-17 MED ORDER — DIPHENHYDRAMINE HCL 25 MG PO CAPS
25.0000 mg | ORAL_CAPSULE | Freq: Once | ORAL | Status: AC
  Administered 2014-01-17: 25 mg via ORAL
  Filled 2014-01-17: qty 1

## 2014-01-17 NOTE — ED Provider Notes (Signed)
CSN: [id number]     Arrival date & time 01/17/14  0106 History   First MD Initiated Contact with Patient 01/17/14 0116     Chief Complaint  Patient presents with  . Allergic Reaction     (Consider location/radiation/quality/duration/timing/severity/associated sxs/prior Treatment) Patient is a 23 y.o. female presenting with allergic reaction. The history is provided by the patient.  Allergic Reaction She has been on amoxicillin for 3 days to treat an ear infection. She states her ear has been feeling better, but she started breaking out in red spots this morning and they are spreading over her body. This is associated with significant itching. She denies difficulty breathing or swallowing. She had also received a prescription for Cortisporin which she states that she has been using.  Past Medical History  Diagnosis Date  . Acid reflux   . Bloating   . Weight loss, abnormal   . Asthma   . PCOS (polycystic ovarian syndrome)    Past Surgical History  Procedure Laterality Date  . Upper gastrointestinal endoscopy  12/05/2009  . Upper gastrointestinal endoscopy  02/07/2009  . Cholecystectomy    . Wisdom tooth extraction     Family History  Problem Relation Age of Onset  . Renal Disease Other    History  Substance Use Topics  . Smoking status: Former Smoker -- 1.00 packs/day    Types: Cigarettes  . Smokeless tobacco: Not on file     Comment: 1 pack a day  . Alcohol Use: No   OB History   Grav Para Term Preterm Abortions TAB SAB Ect Mult Living                 Review of Systems  All other systems reviewed and are negative.     Allergies  Percocet  Home Medications   Prior to Admission medications   Medication Sig Start Date End Date Taking? Authorizing Provider  albuterol (PROVENTIL HFA;VENTOLIN HFA) 108 (90 BASE) MCG/ACT inhaler Inhale 2 puffs into the lungs every 6 (six) hours as needed for wheezing.    Historical Provider, MD  albuterol (PROVENTIL HFA;VENTOLIN  HFA) 108 (90 BASE) MCG/ACT inhaler Inhale 1-2 puffs into the lungs every 6 (six) hours as needed for wheezing. 03/15/13   Evalee Jefferson, PA-C  amoxicillin (AMOXIL) 500 MG capsule Take 1 capsule (500 mg total) by mouth 3 (three) times daily. For 10 days 01/11/14   Tammy L. Triplett, PA-C  cephALEXin (KEFLEX) 500 MG capsule Take 1 capsule (500 mg total) by mouth 4 (four) times daily. 09/09/13   Sharyon Cable, MD  HYDROcodone-acetaminophen (NORCO/VICODIN) 5-325 MG per tablet Take 1 tablet by mouth every 4 (four) hours as needed. 09/09/13   Sharyon Cable, MD  HYDROcodone-acetaminophen (NORCO/VICODIN) 5-325 MG per tablet Take one-two tabs po q 4-6 hrs prn pain 01/11/14   Tammy L. Triplett, PA-C  ibuprofen (ADVIL,MOTRIN) 200 MG tablet Take 600 mg by mouth every 6 (six) hours as needed for pain.    Historical Provider, MD  loratadine-pseudoephedrine (CLARITIN-D 24 HOUR) 10-240 MG per 24 hr tablet Take 1 tablet by mouth daily. 03/15/13   Evalee Jefferson, PA-C  predniSONE (DELTASONE) 10 MG tablet 6, 5, 4, 3, 2 then 1 tablet by mouth daily for 6 days total. 03/15/13   Evalee Jefferson, PA-C   BP 106/70  Pulse 71  Temp(Src) 98.5 F (36.9 C) (Oral)  Resp 16  Ht 5' (1.524 m)  Wt 238 lb (107.956 kg)  BMI 46.48 kg/m2  SpO2 100%  LMP 01/11/2014 Physical Exam  Nursing note and vitals reviewed.  23 year old female, resting comfortably and in no acute distress. Vital signs are normal. Oxygen saturation is 100%, which is normal. Head is normocephalic and atraumatic. PERRLA, EOMI. Oropharynx is clear. Left tympanic membrane is clear. There is edema of the right external auditory canal and pain is elicited when tension is applied .to the helix. The right tympanic membrane appears normal Neck is nontender and supple without adenopathy or JVD. Back is nontender and there is no CVA tenderness. Lungs are clear without rales, wheezes, or rhonchi. Chest is nontender. Heart has regular rate and rhythm without murmur. Abdomen is  soft, flat, nontender without masses or hepatosplenomegaly and peristalsis is normoactive. Extremities have no cyanosis or edema, full range of motion is present. Skin is warm and dry. There are scattered erythematous, raised lesions approximately 3-4 mm in diameter. There is no urticaria and no maculopapular rash. Neurologic: Mental status is normal, cranial nerves are intact, there are no motor or sensory deficits.  ED Course  Procedures (including critical care time)  MDM   Final diagnoses:  Penicillin-induced allergic rash  Otitis externa, right    Rash which most likely represents a penicillin allergy. She will be given a diphenhydramine and prednisone in the ED and advised to use Claritin or Zyrtec at home. Prescription will be given for prednisone and she is referred to ENT for followup. Old records are reviewed and she did have an ED visit 3 days ago for otitis externa with prescription given for amoxicillin and Cortisporin.     Delora Fuel, MD 52/77/82 [phone]

## 2014-01-17 NOTE — ED Notes (Signed)
Dr. Glick at bedside.  

## 2014-01-17 NOTE — ED Notes (Addendum)
Pt. Reports starting new medication, Amoxicillin on Friday. Pt. Reports itching starting this morning. Pt. Has generalized rash. Pt. Denies taking any medications to relieve symptoms. Pt. Denies difficulty breathing. Pt. Reports bleeding to right ear. Pt. Reports right ear infection. No acute distress noted.

## 2014-01-17 NOTE — Discharge Instructions (Signed)
Stop taking amoxicillin. Continue using the ear drops Cortisporin).  Take Claritin or Zyrtec once a day until the rash has cleared.   Allergies Allergies may happen from anything your body is sensitive to. This may be food, medicines, pollens, chemicals, and nearly anything around you in everyday life that produces allergens. An allergen is anything that causes an allergy producing substance. Heredity is often a factor in causing these problems. This means you may have some of the same allergies as your parents. Food allergies happen in all age groups. Food allergies are some of the most severe and life threatening. Some common food allergies are cow's milk, seafood, eggs, nuts, wheat, and soybeans. SYMPTOMS   Swelling around the mouth.  An itchy red rash or hives.  Vomiting or diarrhea.  Difficulty breathing. SEVERE ALLERGIC REACTIONS ARE LIFE-THREATENING. This reaction is called anaphylaxis. It can cause the mouth and throat to swell and cause difficulty with breathing and swallowing. In severe reactions only a trace amount of food (for example, peanut oil in a salad) may cause death within seconds. Seasonal allergies occur in all age groups. These are seasonal because they usually occur during the same season every year. They may be a reaction to molds, grass pollens, or tree pollens. Other causes of problems are house dust mite allergens, pet dander, and mold spores. The symptoms often consist of nasal congestion, a runny itchy nose associated with sneezing, and tearing itchy eyes. There is often an associated itching of the mouth and ears. The problems happen when you come in contact with pollens and other allergens. Allergens are the particles in the air that the body reacts to with an allergic reaction. This causes you to release allergic antibodies. Through a chain of events, these eventually cause you to release histamine into the blood stream. Although it is meant to be protective to the  body, it is this release that causes your discomfort. This is why you were given anti-histamines to feel better. If you are unable to pinpoint the offending allergen, it may be determined by skin or blood testing. Allergies cannot be cured but can be controlled with medicine. Hay fever is a collection of all or some of the seasonal allergy problems. It may often be treated with simple over-the-counter medicine such as diphenhydramine. Take medicine as directed. Do not drink alcohol or drive while taking this medicine. Check with your caregiver or package insert for child dosages. If these medicines are not effective, there are many new medicines your caregiver can prescribe. Stronger medicine such as nasal spray, eye drops, and corticosteroids may be used if the first things you try do not work well. Other treatments such as immunotherapy or desensitizing injections can be used if all else fails. Follow up with your caregiver if problems continue. These seasonal allergies are usually not life threatening. They are generally more of a nuisance that can often be handled using medicine. HOME CARE INSTRUCTIONS   If unsure what causes a reaction, keep a diary of foods eaten and symptoms that follow. Avoid foods that cause reactions.  If hives or rash are present:  Take medicine as directed.  You may use an over-the-counter antihistamine (diphenhydramine) for hives and itching as needed.  Apply cold compresses (cloths) to the skin or take baths in cool water. Avoid hot baths or showers. Heat will make a rash and itching worse.  If you are severely allergic:  Following a treatment for a severe reaction, hospitalization is often required for closer  follow-up.  Wear a medic-alert bracelet or necklace stating the allergy.  You and your family must learn how to give adrenaline or use an anaphylaxis kit.  If you have had a severe reaction, always carry your anaphylaxis kit or EpiPen with you. Use this  medicine as directed by your caregiver if a severe reaction is occurring. Failure to do so could have a fatal outcome. SEEK MEDICAL CARE IF:  You suspect a food allergy. Symptoms generally happen within 30 minutes of eating a food.  Your symptoms have not gone away within 2 days or are getting worse.  You develop new symptoms.  You want to retest yourself or your child with a food or drink you think causes an allergic reaction. Never do this if an anaphylactic reaction to that food or drink has happened before. Only do this under the care of a caregiver. SEEK IMMEDIATE MEDICAL CARE IF:   You have difficulty breathing, are wheezing, or have a tight feeling in your chest or throat.  You have a swollen mouth, or you have hives, swelling, or itching all over your body.  You have had a severe reaction that has responded to your anaphylaxis kit or an EpiPen. These reactions may return when the medicine has worn off. These reactions should be considered life threatening. MAKE SURE YOU:   Understand these instructions.  Will watch your condition.  Will get help right away if you are not doing well or get worse. Document Released: 09/01/2002 Document Revised: 10/03/2012 Document Reviewed: 02/06/2008 [facility] Patient Information 2015 [place], [place]. This information is not intended to replace advice given to you by your health care provider. Make sure you discuss any questions you have with your health care provider.  Prednisone tablets What is this medicine? PREDNISONE (PRED ni sone) is a corticosteroid. It is commonly used to treat inflammation of the skin, joints, lungs, and other organs. Common conditions treated include asthma, allergies, and arthritis. It is also used for other conditions, such as blood disorders and diseases of the adrenal glands. This medicine may be used for other purposes; ask your health care provider or pharmacist if you have questions. COMMON BRAND NAME(S):  Deltasone, Predone, Sterapred, Sterapred DS What should I tell my health care provider before I take this medicine? They need to know if you have any of these conditions: -Cushing's syndrome -diabetes -glaucoma -heart disease -high blood pressure -infection (especially a virus infection such as chickenpox, cold sores, or herpes) -kidney disease -liver disease -mental illness -myasthenia gravis -osteoporosis -seizures -stomach or intestine problems -thyroid disease -an unusual or allergic reaction to lactose, prednisone, other medicines, foods, dyes, or preservatives -pregnant or trying to get pregnant -breast-feeding How should I use this medicine? Take this medicine by mouth with a glass of water. Follow the directions on the prescription label. Take this medicine with food. If you are taking this medicine once a day, take it in the morning. Do not take more medicine than you are told to take. Do not suddenly stop taking your medicine because you may develop a severe reaction. Your doctor will tell you how much medicine to take. If your doctor wants you to stop the medicine, the dose may be slowly lowered over time to avoid any side effects. Talk to your pediatrician regarding the use of this medicine in children. Special care may be needed. Overdosage: If you think you have taken too much of this medicine contact a poison control center or emergency room at once. NOTE:  This medicine is only for you. Do not share this medicine with others. What if I miss a dose? If you miss a dose, take it as soon as you can. If it is almost time for your next dose, talk to your doctor or health care professional. You may need to miss a dose or take an extra dose. Do not take double or extra doses without advice. What may interact with this medicine? Do not take this medicine with any of the following medications: -metyrapone -mifepristone This medicine may also interact with the following  medications: -aminoglutethimide -amphotericin B -aspirin and aspirin-like medicines -barbiturates -certain medicines for diabetes, like glipizide or glyburide -cholestyramine -cholinesterase inhibitors -cyclosporine -digoxin -diuretics -ephedrine -female hormones, like estrogens and birth control pills -isoniazid -ketoconazole -NSAIDS, medicines for pain and inflammation, like ibuprofen or naproxen -phenytoin -rifampin -toxoids -vaccines -warfarin This list may not describe all possible interactions. Give your health care provider a list of all the medicines, herbs, non-prescription drugs, or dietary supplements you use. Also tell them if you smoke, drink alcohol, or use illegal drugs. Some items may interact with your medicine. What should I watch for while using this medicine? Visit your doctor or health care professional for regular checks on your progress. If you are taking this medicine over a prolonged period, carry an identification card with your name and address, the type and dose of your medicine, and your doctor's name and address. This medicine may increase your risk of getting an infection. Tell your doctor or health care professional if you are around anyone with measles or chickenpox, or if you develop sores or blisters that do not heal properly. If you are going to have surgery, tell your doctor or health care professional that you have taken this medicine within the last twelve months. Ask your doctor or health care professional about your diet. You may need to lower the amount of salt you eat. This medicine may affect blood sugar levels. If you have diabetes, check with your doctor or health care professional before you change your diet or the dose of your diabetic medicine. What side effects may I notice from receiving this medicine? Side effects that you should report to your doctor or health care professional as soon as possible: -allergic reactions like skin rash,  itching or hives, swelling of the face, lips, or tongue -changes in emotions or moods -changes in vision -depressed mood -eye pain -fever or chills, cough, sore throat, pain or difficulty passing urine -increased thirst -swelling of ankles, feet Side effects that usually do not require medical attention (report to your doctor or health care professional if they continue or are bothersome): -confusion, excitement, restlessness -headache -nausea, vomiting -skin problems, acne, thin and shiny skin -trouble sleeping -weight gain This list may not describe all possible side effects. Call your doctor for medical advice about side effects. You may report side effects to FDA at 1-800-FDA-1088. Where should I keep my medicine? Keep out of the reach of children. Store at room temperature between 15 and 30 degrees C (59 and 86 degrees F). Protect from light. Keep container tightly closed. Throw away any unused medicine after the expiration date. NOTE: This sheet is a summary. It may not cover all possible information. If you have questions about this medicine, talk to your doctor, pharmacist, or health care provider.  2015, [employer]. (2011-01-22 10:57:14)

## 2014-08-04 ENCOUNTER — Encounter (HOSPITAL_COMMUNITY): Payer: Self-pay | Admitting: *Deleted

## 2014-08-04 ENCOUNTER — Emergency Department (HOSPITAL_COMMUNITY)
Admission: EM | Admit: 2014-08-04 | Discharge: 2014-08-04 | Disposition: A | Discharge status: Home / Self Care | Payer: Medicaid Other | Attending: Emergency Medicine | Admitting: Emergency Medicine

## 2014-08-04 DIAGNOSIS — K5900 Constipation, unspecified: Secondary | ICD-10-CM | POA: Insufficient documentation

## 2014-08-04 DIAGNOSIS — Z720 Tobacco use: Secondary | ICD-10-CM | POA: Insufficient documentation

## 2014-08-04 DIAGNOSIS — Z880 Allergy status to penicillin: Secondary | ICD-10-CM | POA: Insufficient documentation

## 2014-08-04 DIAGNOSIS — R079 Chest pain, unspecified: Secondary | ICD-10-CM | POA: Insufficient documentation

## 2014-08-04 DIAGNOSIS — J45901 Unspecified asthma with (acute) exacerbation: Secondary | ICD-10-CM | POA: Insufficient documentation

## 2014-08-04 DIAGNOSIS — Z7952 Long term (current) use of systemic steroids: Secondary | ICD-10-CM | POA: Insufficient documentation

## 2014-08-04 DIAGNOSIS — R110 Nausea: Secondary | ICD-10-CM | POA: Insufficient documentation

## 2014-08-04 DIAGNOSIS — Z792 Long term (current) use of antibiotics: Secondary | ICD-10-CM | POA: Insufficient documentation

## 2014-08-04 DIAGNOSIS — R1013 Epigastric pain: Secondary | ICD-10-CM | POA: Insufficient documentation

## 2014-08-04 DIAGNOSIS — Z79899 Other long term (current) drug therapy: Secondary | ICD-10-CM | POA: Insufficient documentation

## 2014-08-04 DIAGNOSIS — Z8639 Personal history of other endocrine, nutritional and metabolic disease: Secondary | ICD-10-CM | POA: Insufficient documentation

## 2014-08-04 DIAGNOSIS — Z3202 Encounter for pregnancy test, result negative: Secondary | ICD-10-CM | POA: Insufficient documentation

## 2014-08-04 LAB — COMPREHENSIVE METABOLIC PANEL
ALT: 39 U/L — AB (ref 0–35)
ANION GAP: 5 (ref 5–15)
AST: 25 U/L (ref 0–37)
Albumin: 3.8 g/dL (ref 3.5–5.2)
Alkaline Phosphatase: 67 U/L (ref 39–117)
BUN: 14 mg/dL (ref 6–23)
CALCIUM: 9 mg/dL (ref 8.4–10.5)
CO2: 28 mmol/L (ref 19–32)
Chloride: 107 mmol/L (ref 96–112)
Creatinine, Ser: 0.71 mg/dL (ref 0.50–1.10)
GFR calc Af Amer: >90 mL/min (ref >90)
Glucose, Bld: 98 mg/dL (ref 70–99)
Potassium: 4.3 mmol/L (ref 3.5–5.1)
Sodium: 140 mmol/L (ref 135–145)
TOTAL PROTEIN: 7.3 g/dL (ref 6.0–8.3)
Total Bilirubin: 0.6 mg/dL (ref 0.3–1.2)

## 2014-08-04 LAB — CBC WITH DIFFERENTIAL/PLATELET
Basophils Absolute: 0 10*3/uL (ref 0.0–0.1)
Basophils Relative: 0 % (ref 0–1)
Eosinophils Absolute: 0.2 10*3/uL (ref 0.0–0.7)
Eosinophils Relative: 2 % (ref 0–5)
HCT: 41.9 % (ref 36.0–46.0)
HEMOGLOBIN: 13.9 g/dL (ref 12.0–15.0)
LYMPHS PCT: 21 % (ref 12–46)
Lymphs Abs: 2.6 10*3/uL (ref 0.7–4.0)
MCH: 28.8 pg (ref 26.0–34.0)
MCHC: 33.2 g/dL (ref 30.0–36.0)
MCV: 86.9 fL (ref 78.0–100.0)
Monocytes Absolute: 1.3 10*3/uL — ABNORMAL HIGH (ref 0.1–1.0)
Monocytes Relative: 10 % (ref 3–12)
Neutro Abs: 8.5 10*3/uL — ABNORMAL HIGH (ref 1.7–7.7)
Neutrophils Relative %: 67 % (ref 43–77)
Platelets: 301 10*3/uL (ref 150–400)
RBC: 4.82 MIL/uL (ref 3.87–5.11)
RDW: 12.7 % (ref 11.5–15.5)
WBC: 12.7 10*3/uL — AB (ref 4.0–10.5)

## 2014-08-04 LAB — URINALYSIS, ROUTINE W REFLEX MICROSCOPIC
BILIRUBIN URINE: NEGATIVE
GLUCOSE, UA: NEGATIVE mg/dL
Hgb urine dipstick: NEGATIVE
Ketones, ur: NEGATIVE mg/dL
Leukocytes, UA: NEGATIVE
Nitrite: NEGATIVE
PROTEIN: NEGATIVE mg/dL
Specific Gravity, Urine: 1.02 (ref 1.005–1.030)
Urobilinogen, UA: 0.2 mg/dL (ref 0.0–1.0)
pH: 6 (ref 5.0–8.0)

## 2014-08-04 LAB — LIPASE, BLOOD: Lipase: 35 U/L (ref 11–59)

## 2014-08-04 MED ORDER — SUCRALFATE 1 GM/10ML PO SUSP: 1.0000 g | Freq: Three times a day (TID) | ORAL | Status: DC

## 2014-08-04 MED ORDER — GI COCKTAIL ~~LOC~~
30.0000 mL | Freq: Once | ORAL | Status: AC
  Administered 2014-08-04: 30 mL via ORAL
  Filled 2014-08-04: qty 30

## 2014-08-04 MED ORDER — SODIUM CHLORIDE 0.9 % IV SOLN
Freq: Once | INTRAVENOUS | Status: AC
  Administered 2014-08-04: 13:00:00 via INTRAVENOUS

## 2014-08-04 MED ORDER — OMEPRAZOLE 20 MG PO CPDR: 20.0000 mg | DELAYED_RELEASE_CAPSULE | Freq: Every day | ORAL | Status: DC

## 2014-08-04 NOTE — ED Notes (Signed)
Pt states abdominal pain and nausea x 4 days. Denies vomiting or diarrhea. Last BM was yesterday "not a whole lot though", per pt. Pt states, "I t feels like my food isn't digesting." Pain to upper abdomen, worse when lying down.

## 2014-08-04 NOTE — ED Provider Notes (Signed)
CSN: [id number]     Arrival date & time 08/04/14  1157 History  This chart was scribed for Carmin Muskrat, MD by Peyton Bottoms, ED Scribe. This patient was seen in room APA08/APA08 and the patient's care was started at 12:40 PM.   Chief Complaint  Patient presents with  . Abdominal Pain   Patient is a 24 y.o. female presenting with abdominal pain. The history is provided by the patient. No language interpreter was used.  Abdominal Pain Associated symptoms: chest pain, constipation, nausea and shortness of breath   Associated symptoms: no dysuria, no fever and no vomiting    HPI Comments: Mckenzie Cooley is a 24 y.o. female with a PMHx of PCOS, asthma, cholecystectomy, and acid reflux, who presents to the Emergency Department complaining of intermittent upper abdominal pain that initially began 2 weeks ago with constant persistent pain for the past 4 days. She states that it feels like her food is not digesting. She reports associated nausea, shortness of breath, chest pain, decreased bowel movements and back pain. She states that she is not able to sleep at night due to nausea and increased pain. Patient also reports supine orthopnea. She states that she has lost 10 pounds due to decreased appetite. She states she does not feel like eating due to increased pain caused by food in her stomach. She denies associated fever. She states she has taken gas relief tablets, and a generic acid reflux medication similar to Zantac. Pt is a smoker.  Past Medical History  Diagnosis Date  . Acid reflux   . Bloating   . Weight loss, abnormal   . Asthma   . PCOS (polycystic ovarian syndrome)    Past Surgical History  Procedure Laterality Date  . Upper gastrointestinal endoscopy  12/05/2009  . Upper gastrointestinal endoscopy  02/07/2009  . Cholecystectomy    . Wisdom tooth extraction     Family History  Problem Relation Age of Onset  . Renal Disease Other    History  Substance Use Topics  .  Smoking status: Current Some Day Smoker -- 1.00 packs/day    Types: Cigarettes  . Smokeless tobacco: Not on file     Comment: 1 pack a day  . Alcohol Use: No   OB History    No data available     Review of Systems  Constitutional: Negative for fever.       Per HPI, otherwise negative  HENT:       Per HPI, otherwise negative  Respiratory: Positive for shortness of breath.        Per HPI, otherwise negative  Cardiovascular: Positive for chest pain.       Per HPI, otherwise negative  Gastrointestinal: Positive for nausea, abdominal pain and constipation. Negative for vomiting.  Endocrine:       Negative aside from HPI  Genitourinary: Negative for dysuria.       Neg aside from HPI   Musculoskeletal: Negative for back pain and neck pain.       Per HPI, otherwise negative  Skin: Negative.  Negative for wound.  Neurological: Negative for syncope, weakness, numbness and headaches.   Allergies  Percocet and Amoxicillin  Home Medications   Prior to Admission medications   Medication Sig Start Date End Date Taking? Authorizing Provider  albuterol (PROVENTIL HFA;VENTOLIN HFA) 108 (90 BASE) MCG/ACT inhaler Inhale 2 puffs into the lungs every 6 (six) hours as needed for wheezing.    Historical Provider, MD  albuterol (PROVENTIL HFA;VENTOLIN  HFA) 108 (90 BASE) MCG/ACT inhaler Inhale 1-2 puffs into the lungs every 6 (six) hours as needed for wheezing. 03/15/13   Evalee Jefferson, PA-C  amoxicillin (AMOXIL) 500 MG capsule Take 1 capsule (500 mg total) by mouth 3 (three) times daily. For 10 days 01/11/14   Tammy L. Triplett, PA-C  cephALEXin (KEFLEX) 500 MG capsule Take 1 capsule (500 mg total) by mouth 4 (four) times daily. 09/09/13   Sharyon Cable, MD  HYDROcodone-acetaminophen (NORCO/VICODIN) 5-325 MG per tablet Take 1 tablet by mouth every 4 (four) hours as needed. 09/09/13   Sharyon Cable, MD  HYDROcodone-acetaminophen (NORCO/VICODIN) 5-325 MG per tablet Take one-two tabs po q 4-6 hrs prn  pain 01/11/14   Tammy L. Triplett, PA-C  ibuprofen (ADVIL,MOTRIN) 200 MG tablet Take 600 mg by mouth every 6 (six) hours as needed for pain.    Historical Provider, MD  loratadine-pseudoephedrine (CLARITIN-D 24 HOUR) 10-240 MG per 24 hr tablet Take 1 tablet by mouth daily. 03/15/13   Evalee Jefferson, PA-C  predniSONE (DELTASONE) 10 MG tablet 6, 5, 4, 3, 2 then 1 tablet by mouth daily for 6 days total. 03/15/13   Evalee Jefferson, PA-C  predniSONE (DELTASONE) 50 MG tablet Take 1 tablet (50 mg total) by mouth daily. 2/83/66   Delora Fuel, MD   Triage Vitals: BP 122/84 mmHg  Pulse 73  Temp(Src) 98.4 F (36.9 C) (Oral)  Resp 16  Ht 5' (1.524 m)  Wt 222 lb (100.699 kg)  BMI 43.36 kg/m2  SpO2 100%  LMP 08/04/2014  Physical Exam  Constitutional: She is oriented to person, place, and time. She appears well-developed and well-nourished. No distress.  HENT:  Head: Normocephalic and atraumatic.  Eyes: Conjunctivae and EOM are normal.  Cardiovascular: Normal rate and regular rhythm.   Pulmonary/Chest: Effort normal and breath sounds normal. No stridor. No respiratory distress.  Abdominal: She exhibits no distension.  Musculoskeletal: She exhibits no edema.  Neurological: She is alert and oriented to person, place, and time. No cranial nerve deficit.  Skin: Skin is warm and dry.  Psychiatric: She has a normal mood and affect.  Nursing note and vitals reviewed.  ED Course  Procedures (including critical care time)  DIAGNOSTIC STUDIES: Oxygen Saturation is 100% on RA, normal by my interpretation.    COORDINATION OF CARE: 12:45 PM- Discussed plans to order diagnostic lab work. Will give patient IV fluids and GI cocktail. Pt advised of plan for treatment and pt agrees.  Labs Review Labs Reviewed  COMPREHENSIVE METABOLIC PANEL - Abnormal; Notable for the following:    ALT 39 (*)    All other components within normal limits  CBC WITH DIFFERENTIAL/PLATELET - Abnormal; Notable for the following:    WBC  12.7 (*)    Neutro Abs 8.5 (*)    Monocytes Absolute 1.3 (*)    All other components within normal limits  LIPASE, BLOOD  URINALYSIS, ROUTINE W REFLEX MICROSCOPIC  POC URINE PREG, ED  3:28 PM Patient states that she feels better, this is the best she has felt in some days.  MDM   I personally performed the services described in this documentation, which was scribed in my presence. The recorded information has been reviewed and is accurate.   Patient presents with epigastric pain, nausea. Patient has a soft, non-peritoneal abdomen, obstruction is unlikely as she is passing stool. With epigastric discomfort, nausea, by mouth intolerance, gastritis and pancreatitis were considerations. Patient improved substantially here with fluids, GI cocktail.  Labs were  reassuring.  Patient was discharged in stable condition after she improved, to follow-up with GI.   Carmin Muskrat, MD 08/04/14 [phone]

## 2014-08-04 NOTE — Discharge Instructions (Signed)
As discussed, it is important that you follow up with our gastroenterology physician for continued management of your condition.  If you develop any new, or concerning changes in your condition, please return to the emergency department immediately.

## 2014-08-06 LAB — POC URINE PREG, ED: Preg Test, Ur: NEGATIVE

## 2014-08-19 ENCOUNTER — Emergency Department (HOSPITAL_COMMUNITY): Payer: Medicaid Other

## 2014-08-19 ENCOUNTER — Emergency Department (HOSPITAL_COMMUNITY)
Admission: EM | Admit: 2014-08-19 | Discharge: 2014-08-19 | Disposition: A | Discharge status: Home / Self Care | Payer: Medicaid Other | Attending: Emergency Medicine | Admitting: Emergency Medicine

## 2014-08-19 ENCOUNTER — Encounter (HOSPITAL_COMMUNITY): Payer: Self-pay | Admitting: Emergency Medicine

## 2014-08-19 DIAGNOSIS — Z3202 Encounter for pregnancy test, result negative: Secondary | ICD-10-CM | POA: Insufficient documentation

## 2014-08-19 DIAGNOSIS — Z9089 Acquired absence of other organs: Secondary | ICD-10-CM | POA: Insufficient documentation

## 2014-08-19 DIAGNOSIS — Z9889 Other specified postprocedural states: Secondary | ICD-10-CM | POA: Insufficient documentation

## 2014-08-19 DIAGNOSIS — Z7952 Long term (current) use of systemic steroids: Secondary | ICD-10-CM | POA: Insufficient documentation

## 2014-08-19 DIAGNOSIS — Z79899 Other long term (current) drug therapy: Secondary | ICD-10-CM | POA: Insufficient documentation

## 2014-08-19 DIAGNOSIS — Z792 Long term (current) use of antibiotics: Secondary | ICD-10-CM | POA: Insufficient documentation

## 2014-08-19 DIAGNOSIS — R110 Nausea: Secondary | ICD-10-CM | POA: Insufficient documentation

## 2014-08-19 DIAGNOSIS — K219 Gastro-esophageal reflux disease without esophagitis: Secondary | ICD-10-CM | POA: Insufficient documentation

## 2014-08-19 DIAGNOSIS — R42 Dizziness and giddiness: Secondary | ICD-10-CM | POA: Insufficient documentation

## 2014-08-19 DIAGNOSIS — Z880 Allergy status to penicillin: Secondary | ICD-10-CM | POA: Insufficient documentation

## 2014-08-19 DIAGNOSIS — R1013 Epigastric pain: Secondary | ICD-10-CM | POA: Insufficient documentation

## 2014-08-19 DIAGNOSIS — Z720 Tobacco use: Secondary | ICD-10-CM | POA: Insufficient documentation

## 2014-08-19 DIAGNOSIS — J45909 Unspecified asthma, uncomplicated: Secondary | ICD-10-CM | POA: Insufficient documentation

## 2014-08-19 DIAGNOSIS — Z8639 Personal history of other endocrine, nutritional and metabolic disease: Secondary | ICD-10-CM | POA: Insufficient documentation

## 2014-08-19 DIAGNOSIS — M549 Dorsalgia, unspecified: Secondary | ICD-10-CM | POA: Insufficient documentation

## 2014-08-19 LAB — COMPREHENSIVE METABOLIC PANEL
ALK PHOS: 75 U/L (ref 39–117)
ALT: 51 U/L — ABNORMAL HIGH (ref 0–35)
ANION GAP: 5 (ref 5–15)
AST: 39 U/L — ABNORMAL HIGH (ref 0–37)
Albumin: 4.1 g/dL (ref 3.5–5.2)
BUN: 11 mg/dL (ref 6–23)
CHLORIDE: 107 mmol/L (ref 96–112)
CO2: 27 mmol/L (ref 19–32)
Calcium: 9 mg/dL (ref 8.4–10.5)
Creatinine, Ser: 0.75 mg/dL (ref 0.50–1.10)
GFR calc Af Amer: >90 mL/min (ref >90)
GFR calc non Af Amer: >90 mL/min (ref >90)
Glucose, Bld: 84 mg/dL (ref 70–99)
Potassium: 3.7 mmol/L (ref 3.5–5.1)
Sodium: 139 mmol/L (ref 135–145)
TOTAL PROTEIN: 7.6 g/dL (ref 6.0–8.3)
Total Bilirubin: 0.6 mg/dL (ref 0.3–1.2)

## 2014-08-19 LAB — CBC WITH DIFFERENTIAL/PLATELET
Basophils Absolute: 0 10*3/uL (ref 0.0–0.1)
Basophils Relative: 0 % (ref 0–1)
EOS ABS: 0.2 10*3/uL (ref 0.0–0.7)
Eosinophils Relative: 2 % (ref 0–5)
HCT: 45 % (ref 36.0–46.0)
Hemoglobin: 15.7 g/dL — ABNORMAL HIGH (ref 12.0–15.0)
LYMPHS ABS: 2.5 10*3/uL (ref 0.7–4.0)
Lymphocytes Relative: 23 % (ref 12–46)
MCH: 29.6 pg (ref 26.0–34.0)
MCHC: 34.9 g/dL (ref 30.0–36.0)
MCV: 84.7 fL (ref 78.0–100.0)
MONOS PCT: 9 % (ref 3–12)
Monocytes Absolute: 1 10*3/uL (ref 0.1–1.0)
NEUTROS ABS: 7.1 10*3/uL (ref 1.7–7.7)
NEUTROS PCT: 66 % (ref 43–77)
Platelets: 319 10*3/uL (ref 150–400)
RBC: 5.31 MIL/uL — ABNORMAL HIGH (ref 3.87–5.11)
RDW: 12.7 % (ref 11.5–15.5)
WBC: 10.8 10*3/uL — ABNORMAL HIGH (ref 4.0–10.5)

## 2014-08-19 LAB — URINALYSIS, ROUTINE W REFLEX MICROSCOPIC
GLUCOSE, UA: NEGATIVE mg/dL
Leukocytes, UA: NEGATIVE
Nitrite: NEGATIVE
Protein, ur: NEGATIVE mg/dL
Urobilinogen, UA: 0.2 mg/dL (ref 0.0–1.0)
pH: 5.5 (ref 5.0–8.0)

## 2014-08-19 LAB — URINE MICROSCOPIC-ADD ON

## 2014-08-19 LAB — PREGNANCY, URINE: Preg Test, Ur: NEGATIVE

## 2014-08-19 LAB — LIPASE, BLOOD: Lipase: 22 U/L (ref 11–59)

## 2014-08-19 MED ORDER — IOHEXOL 300 MG/ML  SOLN
25.0000 mL | Freq: Once | INTRAMUSCULAR | Status: AC | PRN
  Administered 2014-08-19: 25 mL via ORAL

## 2014-08-19 MED ORDER — PROMETHAZINE HCL 25 MG PO TABS: 25.0000 mg | ORAL_TABLET | Freq: Four times a day (QID) | ORAL | Status: DC | PRN

## 2014-08-19 MED ORDER — SODIUM CHLORIDE 0.9 % IV BOLUS (SEPSIS)
1000.0000 mL | Freq: Once | INTRAVENOUS | Status: AC
  Administered 2014-08-19: 1000 mL via INTRAVENOUS

## 2014-08-19 MED ORDER — ONDANSETRON HCL 4 MG/2ML IJ SOLN
4.0000 mg | Freq: Once | INTRAMUSCULAR | Status: AC
  Administered 2014-08-19: 4 mg via INTRAVENOUS
  Filled 2014-08-19: qty 2

## 2014-08-19 MED ORDER — FAMOTIDINE 20 MG PO TABS: 20.0000 mg | ORAL_TABLET | Freq: Two times a day (BID) | ORAL | Status: DC

## 2014-08-19 MED ORDER — HYDROMORPHONE HCL 1 MG/ML IJ SOLN
1.0000 mg | Freq: Once | INTRAMUSCULAR | Status: AC
  Administered 2014-08-19: 1 mg via INTRAVENOUS
  Filled 2014-08-19: qty 1

## 2014-08-19 MED ORDER — HYDROCODONE-ACETAMINOPHEN 5-325 MG PO TABS: 1.0000 | ORAL_TABLET | Freq: Four times a day (QID) | ORAL | Status: DC | PRN

## 2014-08-19 MED ORDER — PANTOPRAZOLE SODIUM 40 MG IV SOLR
40.0000 mg | Freq: Once | INTRAVENOUS | Status: AC
  Administered 2014-08-19: 40 mg via INTRAVENOUS
  Filled 2014-08-19: qty 40

## 2014-08-19 MED ORDER — SODIUM CHLORIDE 0.9 % IV SOLN: INTRAVENOUS | Status: DC

## 2014-08-19 MED ORDER — ONDANSETRON 4 MG PO TBDP: 4.0000 mg | ORAL_TABLET | Freq: Three times a day (TID) | ORAL | Status: DC | PRN

## 2014-08-19 MED ORDER — IOHEXOL 300 MG/ML  SOLN
100.0000 mL | Freq: Once | INTRAMUSCULAR | Status: AC | PRN
  Administered 2014-08-19: 100 mL via INTRAVENOUS

## 2014-08-19 MED ORDER — SODIUM CHLORIDE 0.9 % IJ SOLN
INTRAMUSCULAR | Status: AC
  Filled 2014-08-19: qty 45

## 2014-08-19 NOTE — ED Notes (Signed)
Pt seen here previously for abdominal pain. Pt now reports increased pain and abdominal bloating.

## 2014-08-19 NOTE — ED Notes (Signed)
MD at bedside. 

## 2014-08-19 NOTE — Discharge Instructions (Signed)
As we discussed the epigastric abdominal pain could represent a gastritis or ulcer disease. Take medicines as directed. Make an appointment to follow-up with GI medicine. Return for any new or worse symptoms.

## 2014-08-19 NOTE — ED Notes (Signed)
Discharge instructions given, pt demonstrated teach back and verbal understanding. No concerns voiced.  

## 2014-08-19 NOTE — ED Provider Notes (Signed)
CSN: [id number]     Arrival date & time 08/19/14  1523 History  This chart was scribed for Fredia Sorrow, MD by Hilda Lias, ED Scribe. This patient was seen in room APA08/APA08 and the patient's care was started at 5:04 PM.    Chief Complaint  Patient presents with  . Abdominal Pain     Patient is a 24 y.o. female presenting with abdominal pain. The history is provided by the patient. No language interpreter was used.  Abdominal Pain Pain location:  Epigastric Pain quality: burning   Pain radiates to:  Back Pain severity:  Moderate Timing:  Intermittent Progression:  Worsening Chronicity:  Recurrent Worsened by:  Eating Associated symptoms: nausea   Associated symptoms: no chest pain, no chills, no cough, no diarrhea, no dysuria, no fever, no shortness of breath and no vomiting      HPI Comments: Mckenzie Cooley is a 24 y.o. female who presents to the Emergency Department complaining of intermittent, worsening, burning epigastric abdominal pain with associated nausea that has been present for around one month. Pt was seen for same abdominal pain on 08/04/14 and did not receive a CT on abdomen, but was given Carafate. Pt states that Carafate helped with her symptoms for a few days, but towards the latter end of her prescription the same symptoms came back. Pt states that at the time of her last visit only certain foods made her have abdominal pain, whereas she now has the same pain with everything that she eats. Pt states that her appetite has changed because of this, and notes that she has not been hungry lately. Pt states that her pain is a burning sensation and comes on immediately after she eats anything. Pt states that her pain is a 7/10 in severity and radiates from the epigastric region to the periumbilical region of the abdomen and to the back. Pt denies vomiting and diarrhea.     Past Medical History  Diagnosis Date  . Acid reflux   . Bloating   . Weight loss, abnormal    . Asthma   . PCOS (polycystic ovarian syndrome)    Past Surgical History  Procedure Laterality Date  . Upper gastrointestinal endoscopy  12/05/2009  . Upper gastrointestinal endoscopy  02/07/2009  . Cholecystectomy    . Wisdom tooth extraction     Family History  Problem Relation Age of Onset  . Renal Disease Other    History  Substance Use Topics  . Smoking status: Current Some Day Smoker -- 1.00 packs/day    Types: Cigarettes  . Smokeless tobacco: Not on file     Comment: 1 pack a day  . Alcohol Use: No   OB History    No data available     Review of Systems  Constitutional: Negative for fever and chills.  HENT: Negative for congestion.   Eyes: Negative for visual disturbance.  Respiratory: Negative for cough and shortness of breath.   Cardiovascular: Negative for chest pain and leg swelling.  Gastrointestinal: Positive for nausea and abdominal pain. Negative for vomiting and diarrhea.  Genitourinary: Negative for dysuria.  Musculoskeletal: Positive for back pain.  Skin: Negative for rash.  Neurological: Positive for dizziness and light-headedness.  Hematological: Does not bruise/bleed easily.  Psychiatric/Behavioral: Negative for confusion.  All other systems reviewed and are negative.     Allergies  Percocet and Amoxicillin  Home Medications   Prior to Admission medications   Medication Sig Start Date End Date Taking? Authorizing Provider  ranitidine (ZANTAC) 150 MG tablet Take 150 mg by mouth daily as needed for heartburn.   Yes Historical Provider, MD  sucralfate (CARAFATE) 1 GM/10ML suspension Take 10 mLs (1 g total) by mouth 4 (four) times daily -  with meals and at bedtime. 08/04/14  Yes Carmin Muskrat, MD  albuterol (PROVENTIL HFA;VENTOLIN HFA) 108 (90 BASE) MCG/ACT inhaler Inhale 1-2 puffs into the lungs every 6 (six) hours as needed for wheezing. 03/15/13   Evalee Jefferson, PA-C  amoxicillin (AMOXIL) 500 MG capsule Take 1 capsule (500 mg total) by mouth 3  (three) times daily. For 10 days Patient not taking: Reported on 08/04/2014 01/11/14   Tammy L. Triplett, PA-C  cephALEXin (KEFLEX) 500 MG capsule Take 1 capsule (500 mg total) by mouth 4 (four) times daily. Patient not taking: Reported on 08/04/2014 09/09/13   Sharyon Cable, MD  famotidine (PEPCID) 20 MG tablet Take 1 tablet (20 mg total) by mouth 2 (two) times daily. 08/19/14   Fredia Sorrow, MD  HYDROcodone-acetaminophen (NORCO/VICODIN) 5-325 MG per tablet Take 1 tablet by mouth every 4 (four) hours as needed. Patient not taking: Reported on 08/04/2014 09/09/13   Sharyon Cable, MD  HYDROcodone-acetaminophen (NORCO/VICODIN) 5-325 MG per tablet Take one-two tabs po q 4-6 hrs prn pain Patient not taking: Reported on 08/04/2014 01/11/14   Tammy L. Triplett, PA-C  HYDROcodone-acetaminophen (NORCO/VICODIN) 5-325 MG per tablet Take 1-2 tablets by mouth every 6 (six) hours as needed. 08/19/14   Fredia Sorrow, MD  loratadine-pseudoephedrine (CLARITIN-D 24 HOUR) 10-240 MG per 24 hr tablet Take 1 tablet by mouth daily. Patient not taking: Reported on 08/04/2014 03/15/13   Evalee Jefferson, PA-C  omeprazole (PRILOSEC) 20 MG capsule Take 1 capsule (20 mg total) by mouth daily. Patient not taking: Reported on 08/19/2014 08/04/14   Carmin Muskrat, MD  ondansetron (ZOFRAN ODT) 4 MG disintegrating tablet Take 1 tablet (4 mg total) by mouth every 8 (eight) hours as needed. 08/19/14   Fredia Sorrow, MD  predniSONE (DELTASONE) 10 MG tablet 6, 5, 4, 3, 2 then 1 tablet by mouth daily for 6 days total. Patient not taking: Reported on 08/04/2014 03/15/13   Evalee Jefferson, PA-C  predniSONE (DELTASONE) 50 MG tablet Take 1 tablet (50 mg total) by mouth daily. Patient not taking: Reported on 08/04/2014 6/37/85   Delora Fuel, MD  promethazine (PHENERGAN) 25 MG tablet Take 1 tablet (25 mg total) by mouth every 6 (six) hours as needed. 08/19/14   Fredia Sorrow, MD   BP 108/55 mmHg  Pulse 62  Temp(Src) 98 F (36.7 C) (Oral)  Resp  17  Ht 5' (1.524 m)  Wt 207 lb (93.895 kg)  BMI 40.43 kg/m2  SpO2 100%  LMP 08/04/2014 Physical Exam  Constitutional: She is oriented to person, place, and time. She appears well-developed and well-nourished.  HENT:  Head: Normocephalic.  Mouth/Throat: Oropharynx is clear and moist.  Eyes: Conjunctivae and EOM are normal. Pupils are equal, round, and reactive to light.  Sclera clear  Neck: Normal range of motion. Neck supple.  Cardiovascular: Normal rate and regular rhythm.   No murmur heard. Pulmonary/Chest: Effort normal and breath sounds normal. She has no wheezes. She has no rales.  Abdominal: Soft. Bowel sounds are normal. There is tenderness. There is no guarding.  Tender epigastric LUQ no guarding  Musculoskeletal: She exhibits no edema.  No swelling in ankles  Neurological: She is alert and oriented to person, place, and time. No cranial nerve deficit. She exhibits normal muscle tone.  Coordination normal.  Skin: Skin is warm. No rash noted.    ED Course  Procedures (including critical care time)  DIAGNOSTIC STUDIES: Oxygen Saturation is 100% on RA, normal by my interpretation.    COORDINATION OF CARE: 5:13 PM Discussed treatment plan with pt at bedside and pt agreed to plan.   Labs Review Labs Reviewed  URINALYSIS, ROUTINE W REFLEX MICROSCOPIC - Abnormal; Notable for the following:    Color, Urine AMBER (*)    APPearance CLOUDY (*)    Specific Gravity, Urine >1.030 (*)    Hgb urine dipstick TRACE (*)    Bilirubin Urine SMALL (*)    Ketones, ur TRACE (*)    All other components within normal limits  CBC WITH DIFFERENTIAL/PLATELET - Abnormal; Notable for the following:    WBC 10.8 (*)    RBC 5.31 (*)    Hemoglobin 15.7 (*)    All other components within normal limits  COMPREHENSIVE METABOLIC PANEL - Abnormal; Notable for the following:    AST 39 (*)    ALT 51 (*)    All other components within normal limits  PREGNANCY, URINE  URINE MICROSCOPIC-ADD ON   LIPASE, BLOOD   Results for orders placed or performed during the hospital encounter of 08/19/14  Urinalysis, Routine w reflex microscopic  Result Value Ref Range   Color, Urine AMBER (A) YELLOW   APPearance CLOUDY (A) CLEAR   Specific Gravity, Urine >1.030 (H) 1.005 - 1.030   pH 5.5 5.0 - 8.0   Glucose, UA NEGATIVE NEGATIVE mg/dL   Hgb urine dipstick TRACE (A) NEGATIVE   Bilirubin Urine SMALL (A) NEGATIVE   Ketones, ur TRACE (A) NEGATIVE mg/dL   Protein, ur NEGATIVE NEGATIVE mg/dL   Urobilinogen, UA 0.2 0.0 - 1.0 mg/dL   Nitrite NEGATIVE NEGATIVE   Leukocytes, UA NEGATIVE NEGATIVE  Pregnancy, urine  Result Value Ref Range   Preg Test, Ur NEGATIVE NEGATIVE  Urine microscopic-add on  Result Value Ref Range   RBC / HPF 0-2 <3 RBC/hpf  CBC with Differential/Platelet  Result Value Ref Range   WBC 10.8 (H) 4.0 - 10.5 K/uL   RBC 5.31 (H) 3.87 - 5.11 MIL/uL   Hemoglobin 15.7 (H) 12.0 - 15.0 g/dL   HCT 45.0 36.0 - 46.0 %   MCV 84.7 78.0 - 100.0 fL   MCH 29.6 26.0 - 34.0 pg   MCHC 34.9 30.0 - 36.0 g/dL   RDW 12.7 11.5 - 15.5 %   Platelets 319 150 - 400 K/uL   Neutrophils Relative % 66 43 - 77 %   Neutro Abs 7.1 1.7 - 7.7 K/uL   Lymphocytes Relative 23 12 - 46 %   Lymphs Abs 2.5 0.7 - 4.0 K/uL   Monocytes Relative 9 3 - 12 %   Monocytes Absolute 1.0 0.1 - 1.0 K/uL   Eosinophils Relative 2 0 - 5 %   Eosinophils Absolute 0.2 0.0 - 0.7 K/uL   Basophils Relative 0 0 - 1 %   Basophils Absolute 0.0 0.0 - 0.1 K/uL  Comprehensive metabolic panel  Result Value Ref Range   Sodium 139 135 - 145 mmol/L   Potassium 3.7 3.5 - 5.1 mmol/L   Chloride 107 96 - 112 mmol/L   CO2 27 19 - 32 mmol/L   Glucose, Bld 84 70 - 99 mg/dL   BUN 11 6 - 23 mg/dL   Creatinine, Ser 0.75 0.50 - 1.10 mg/dL   Calcium 9.0 8.4 - 10.5 mg/dL   Total  Protein 7.6 6.0 - 8.3 g/dL   Albumin 4.1 3.5 - 5.2 g/dL   AST 39 (H) 0 - 37 U/L   ALT 51 (H) 0 - 35 U/L   Alkaline Phosphatase 75 39 - 117 U/L   Total Bilirubin  0.6 0.3 - 1.2 mg/dL   GFR calc non Af Amer >90 >90 mL/min   GFR calc Af Amer >90 >90 mL/min   Anion gap 5 5 - 15  Lipase, blood  Result Value Ref Range   Lipase 22 11 - 59 U/L     Imaging Review Ct Abdomen Pelvis W Contrast  08/19/2014   CLINICAL DATA:  Epigastric abdominal pain  EXAM: CT ABDOMEN AND PELVIS WITH CONTRAST  TECHNIQUE: Multidetector CT imaging of the abdomen and pelvis was performed using the standard protocol following bolus administration of intravenous contrast.  CONTRAST:  164mL OMNIPAQUE IOHEXOL 300 MG/ML SOLN, 59mL OMNIPAQUE IOHEXOL 300 MG/ML SOLN  COMPARISON:  10/26/2012  FINDINGS: Lung bases are free of acute infiltrate or sizable effusion.  The gallbladder has been surgically removed. The liver, spleen, adrenal glands and pancreas are within normal limits. The kidneys are well visualized bilaterally and reveal no obstructive change or renal calculi. Stomach is well distended. No reflux or reflux esophagitis is seen. The appendix is within normal limits. The bladder is well distended. Mild ovarian cystic changes noted stable from the previous exam. No free fluid is seen. No bony abnormality is noted.  IMPRESSION: No acute abnormality noted.   Electronically Signed   By: Inez Catalina M.D.   On: 08/19/2014 19:40     EKG Interpretation None      MDM   Final diagnoses:  Epigastric abdominal pain    Patient with a persistent epigastric abdominal pain. Made worse with eating food. Associated persistent nausea. Patient had her gallbladder removed. Symptoms concerning for possible peptic ulcer disease or gastritis. CT scan of the abdomen without any acute findings. This does not rule out peptic ulcer disease though. Patient's lipase not elevated no evidence of pancreatitis. No significant liver function test abnormalities. No significant leukocytosis.  I personally performed the services described in this documentation, which was scribed in my presence. The recorded  information has been reviewed and is accurate.     Fredia Sorrow, MD 08/19/14 2101

## 2014-08-20 ENCOUNTER — Emergency Department (HOSPITAL_COMMUNITY)
Admission: EM | Admit: 2014-08-20 | Discharge: 2014-08-20 | Disposition: A | Discharge status: Home / Self Care | Payer: Medicaid Other | Attending: Emergency Medicine | Admitting: Emergency Medicine

## 2014-08-20 ENCOUNTER — Encounter (HOSPITAL_COMMUNITY): Payer: Self-pay | Admitting: *Deleted

## 2014-08-20 DIAGNOSIS — Z9889 Other specified postprocedural states: Secondary | ICD-10-CM | POA: Insufficient documentation

## 2014-08-20 DIAGNOSIS — Z880 Allergy status to penicillin: Secondary | ICD-10-CM | POA: Insufficient documentation

## 2014-08-20 DIAGNOSIS — Z792 Long term (current) use of antibiotics: Secondary | ICD-10-CM | POA: Insufficient documentation

## 2014-08-20 DIAGNOSIS — J45909 Unspecified asthma, uncomplicated: Secondary | ICD-10-CM | POA: Insufficient documentation

## 2014-08-20 DIAGNOSIS — K219 Gastro-esophageal reflux disease without esophagitis: Secondary | ICD-10-CM | POA: Insufficient documentation

## 2014-08-20 DIAGNOSIS — Z7952 Long term (current) use of systemic steroids: Secondary | ICD-10-CM | POA: Insufficient documentation

## 2014-08-20 DIAGNOSIS — Z720 Tobacco use: Secondary | ICD-10-CM | POA: Insufficient documentation

## 2014-08-20 DIAGNOSIS — Z79899 Other long term (current) drug therapy: Secondary | ICD-10-CM | POA: Insufficient documentation

## 2014-08-20 DIAGNOSIS — K625 Hemorrhage of anus and rectum: Secondary | ICD-10-CM

## 2014-08-20 LAB — CBC WITH DIFFERENTIAL/PLATELET
BASOS ABS: 0 10*3/uL (ref 0.0–0.1)
Basophils Relative: 0 % (ref 0–1)
EOS PCT: 2 % (ref 0–5)
Eosinophils Absolute: 0.2 10*3/uL (ref 0.0–0.7)
HEMATOCRIT: 43 % (ref 36.0–46.0)
HEMOGLOBIN: 14.7 g/dL (ref 12.0–15.0)
LYMPHS PCT: 22 % (ref 12–46)
Lymphs Abs: 1.8 10*3/uL (ref 0.7–4.0)
MCH: 29.3 pg (ref 26.0–34.0)
MCHC: 34.2 g/dL (ref 30.0–36.0)
MCV: 85.7 fL (ref 78.0–100.0)
MONOS PCT: 8 % (ref 3–12)
Monocytes Absolute: 0.7 10*3/uL (ref 0.1–1.0)
NEUTROS ABS: 5.6 10*3/uL (ref 1.7–7.7)
Neutrophils Relative %: 68 % (ref 43–77)
Platelets: 321 10*3/uL (ref 150–400)
RBC: 5.02 MIL/uL (ref 3.87–5.11)
RDW: 12.7 % (ref 11.5–15.5)
WBC: 8.2 10*3/uL (ref 4.0–10.5)

## 2014-08-20 LAB — BASIC METABOLIC PANEL
Anion gap: 2 — ABNORMAL LOW (ref 5–15)
BUN: 8 mg/dL (ref 6–23)
CALCIUM: 8.9 mg/dL (ref 8.4–10.5)
CO2: 28 mmol/L (ref 19–32)
Chloride: 108 mmol/L (ref 96–112)
Creatinine, Ser: 0.76 mg/dL (ref 0.50–1.10)
GFR calc Af Amer: >90 mL/min (ref >90)
GLUCOSE: 97 mg/dL (ref 70–99)
Potassium: 4.2 mmol/L (ref 3.5–5.1)
Sodium: 138 mmol/L (ref 135–145)

## 2014-08-20 LAB — POC OCCULT BLOOD, ED: Fecal Occult Bld: POSITIVE — AB

## 2014-08-20 NOTE — ED Notes (Signed)
Seen here yesterday for abd pain, Today has blood in stool. When trying to remove stool from rectum with gloved hand.  Abd pain and low back pain

## 2014-08-20 NOTE — Discharge Instructions (Signed)
Labs were normal today. Return for bloody bowel movements that stain the water in the toilet all red 3 times in a day. Return for any new or worse symptoms.

## 2014-08-20 NOTE — ED Provider Notes (Signed)
CSN: [id number]     Arrival date & time 08/20/14  1439 History  This chart was scribed for Mckenzie Sorrow, MD by Dellis Filbert, ED Scribe. The patient was seen in [place] and the patient's care was started at 4:25 PM.  Chief Complaint  Patient presents with  . Blood In Stools   Patient is a 24 y.o. female presenting with hematochezia. The history is provided by the patient. No language interpreter was used.  Rectal Bleeding Quality:  Bright red Amount:  Scant Duration:  1 day Timing:  Sporadic Progression:  Unchanged Context: constipation   Relieved by:  Nothing Ineffective treatments:  None tried Associated symptoms: abdominal pain   Associated symptoms: no fever and no vomiting     HPI Comments: Mckenzie Cooley is a 24 y.o. female who presents to the Emergency Department complaining of blood in stool onset today. Pt was constipated and while attempted to use the bathroom she noticed blood in her stool. Blood was red, some was spotted in the toilet but most was on the toilet paper. Pt was here yesterday for epigastric abdominal pain had a CT-scan with no significant constipation. No history of hemorrhoids.   Past Medical History  Diagnosis Date  . Acid reflux   . Bloating   . Weight loss, abnormal   . Asthma   . PCOS (polycystic ovarian syndrome)    Past Surgical History  Procedure Laterality Date  . Upper gastrointestinal endoscopy  12/05/2009  . Upper gastrointestinal endoscopy  02/07/2009  . Cholecystectomy    . Wisdom tooth extraction     Family History  Problem Relation Age of Onset  . Renal Disease Other    History  Substance Use Topics  . Smoking status: Current Some Day Smoker -- 1.00 packs/day    Types: Cigarettes  . Smokeless tobacco: Not on file     Comment: 1 pack a day  . Alcohol Use: No   OB History    No data available     Review of Systems  Constitutional: Negative for fever and chills.  HENT: Negative for congestion.   Eyes: Negative  for visual disturbance.  Respiratory: Negative for cough and shortness of breath.   Cardiovascular: Negative for chest pain and leg swelling.  Gastrointestinal: Positive for nausea, abdominal pain, constipation, hematochezia and anal bleeding. Negative for vomiting and diarrhea.  Genitourinary: Negative for dysuria.  Musculoskeletal: Positive for back pain.  Skin: Negative for rash.  Neurological: Negative for headaches.  Hematological: Does not bruise/bleed easily.  Psychiatric/Behavioral: Negative for confusion.      Allergies  Percocet and Amoxicillin  Home Medications   Prior to Admission medications   Medication Sig Start Date End Date Taking? Authorizing Provider  ranitidine (ZANTAC) 150 MG tablet Take 150 mg by mouth daily as needed for heartburn.   Yes Historical Provider, MD  sucralfate (CARAFATE) 1 GM/10ML suspension Take 10 mLs (1 g total) by mouth 4 (four) times daily -  with meals and at bedtime. 08/04/14  Yes Carmin Muskrat, MD  albuterol (PROVENTIL HFA;VENTOLIN HFA) 108 (90 BASE) MCG/ACT inhaler Inhale 1-2 puffs into the lungs every 6 (six) hours as needed for wheezing. 03/15/13   Evalee Jefferson, PA-C  amoxicillin (AMOXIL) 500 MG capsule Take 1 capsule (500 mg total) by mouth 3 (three) times daily. For 10 days Patient not taking: Reported on 08/04/2014 01/11/14   Tammy L. Triplett, PA-C  cephALEXin (KEFLEX) 500 MG capsule Take 1 capsule (500 mg total) by mouth 4 (four)  times daily. Patient not taking: Reported on 08/04/2014 09/09/13   Sharyon Cable, MD  famotidine (PEPCID) 20 MG tablet Take 1 tablet (20 mg total) by mouth 2 (two) times daily. 08/19/14   Mckenzie Sorrow, MD  HYDROcodone-acetaminophen (NORCO/VICODIN) 5-325 MG per tablet Take 1 tablet by mouth every 4 (four) hours as needed. Patient not taking: Reported on 08/04/2014 09/09/13   Sharyon Cable, MD  HYDROcodone-acetaminophen (NORCO/VICODIN) 5-325 MG per tablet Take one-two tabs po q 4-6 hrs prn pain Patient not  taking: Reported on 08/04/2014 01/11/14   Tammy L. Triplett, PA-C  HYDROcodone-acetaminophen (NORCO/VICODIN) 5-325 MG per tablet Take 1-2 tablets by mouth every 6 (six) hours as needed. 08/19/14   Mckenzie Sorrow, MD  loratadine-pseudoephedrine (CLARITIN-D 24 HOUR) 10-240 MG per 24 hr tablet Take 1 tablet by mouth daily. Patient not taking: Reported on 08/04/2014 03/15/13   Evalee Jefferson, PA-C  omeprazole (PRILOSEC) 20 MG capsule Take 1 capsule (20 mg total) by mouth daily. Patient not taking: Reported on 08/19/2014 08/04/14   Carmin Muskrat, MD  ondansetron (ZOFRAN ODT) 4 MG disintegrating tablet Take 1 tablet (4 mg total) by mouth every 8 (eight) hours as needed. 08/19/14   Mckenzie Sorrow, MD  predniSONE (DELTASONE) 10 MG tablet 6, 5, 4, 3, 2 then 1 tablet by mouth daily for 6 days total. Patient not taking: Reported on 08/04/2014 03/15/13   Evalee Jefferson, PA-C  predniSONE (DELTASONE) 50 MG tablet Take 1 tablet (50 mg total) by mouth daily. Patient not taking: Reported on 08/04/2014 6/50/35   Delora Fuel, MD  promethazine (PHENERGAN) 25 MG tablet Take 1 tablet (25 mg total) by mouth every 6 (six) hours as needed. 08/19/14   Mckenzie Sorrow, MD   BP 136/73 mmHg  Pulse 73  Temp(Src) 98 F (36.7 C) (Oral)  Resp 14  Ht 5' (1.524 m)  Wt 207 lb (93.895 kg)  BMI 40.43 kg/m2  SpO2 100%  LMP 08/04/2014 Physical Exam  Constitutional: She is oriented to person, place, and time. She appears well-developed and well-nourished.  HENT:  Head: Normocephalic.  Mouth/Throat: Oropharynx is clear and moist.  Eyes: Conjunctivae and EOM are normal. Pupils are equal, round, and reactive to light. No scleral icterus.  Cardiovascular: Normal rate and regular rhythm.   Pulmonary/Chest: Effort normal and breath sounds normal.  Lungs are clear bilaterally.  Abdominal: Bowel sounds are normal. There is no tenderness.  Genitourinary: Guaiac positive stool.  No external hemorrhoids. Healed skin tag. No fissure. No stool in  vault. Trace stool was heme positive. No gross blood. No prolapsed internal hemorrhoids. No palpable internal hemorrhoids. No rectal mass.  Musculoskeletal: She exhibits no edema.  Neurological: She is alert and oriented to person, place, and time. She has normal reflexes. No cranial nerve deficit. She exhibits normal muscle tone. Coordination normal.  Skin: Skin is warm. No rash noted.  Nursing note and vitals reviewed.   ED Course  Procedures  DIAGNOSTIC STUDIES: Oxygen Saturation is 100% on room air, normal by my interpretation.    COORDINATION OF CARE: 4:33 PM Discussed treatment plan with pt at bedside and pt agreed to plan.  Labs Review Labs Reviewed  BASIC METABOLIC PANEL - Abnormal; Notable for the following:    Anion gap 2 (*)    All other components within normal limits  POC OCCULT BLOOD, ED - Abnormal; Notable for the following:    Fecal Occult Bld POSITIVE (*)    All other components within normal limits  CBC WITH DIFFERENTIAL/PLATELET  Results for orders placed or performed during the hospital encounter of 53/61/44  Basic metabolic panel  Result Value Ref Range   Sodium 138 135 - 145 mmol/L   Potassium 4.2 3.5 - 5.1 mmol/L   Chloride 108 96 - 112 mmol/L   CO2 28 19 - 32 mmol/L   Glucose, Bld 97 70 - 99 mg/dL   BUN 8 6 - 23 mg/dL   Creatinine, Ser 0.76 0.50 - 1.10 mg/dL   Calcium 8.9 8.4 - 10.5 mg/dL   GFR calc non Af Amer >90 >90 mL/min   GFR calc Af Amer >90 >90 mL/min   Anion gap 2 (L) 5 - 15  CBC with Differential  Result Value Ref Range   WBC 8.2 4.0 - 10.5 K/uL   RBC 5.02 3.87 - 5.11 MIL/uL   Hemoglobin 14.7 12.0 - 15.0 g/dL   HCT 43.0 36.0 - 46.0 %   MCV 85.7 78.0 - 100.0 fL   MCH 29.3 26.0 - 34.0 pg   MCHC 34.2 30.0 - 36.0 g/dL   RDW 12.7 11.5 - 15.5 %   Platelets 321 150 - 400 K/uL   Neutrophils Relative % 68 43 - 77 %   Neutro Abs 5.6 1.7 - 7.7 K/uL   Lymphocytes Relative 22 12 - 46 %   Lymphs Abs 1.8 0.7 - 4.0 K/uL   Monocytes Relative 8 3  - 12 %   Monocytes Absolute 0.7 0.1 - 1.0 K/uL   Eosinophils Relative 2 0 - 5 %   Eosinophils Absolute 0.2 0.0 - 0.7 K/uL   Basophils Relative 0 0 - 1 %   Basophils Absolute 0.0 0.0 - 0.1 K/uL  POC occult blood, ED  Result Value Ref Range   Fecal Occult Bld POSITIVE (A) NEGATIVE    Imaging Review Ct Abdomen Pelvis W Contrast  08/19/2014   CLINICAL DATA:  Epigastric abdominal pain  EXAM: CT ABDOMEN AND PELVIS WITH CONTRAST  TECHNIQUE: Multidetector CT imaging of the abdomen and pelvis was performed using the standard protocol following bolus administration of intravenous contrast.  CONTRAST:  132mL OMNIPAQUE IOHEXOL 300 MG/ML SOLN, 46mL OMNIPAQUE IOHEXOL 300 MG/ML SOLN  COMPARISON:  10/26/2012  FINDINGS: Lung bases are free of acute infiltrate or sizable effusion.  The gallbladder has been surgically removed. The liver, spleen, adrenal glands and pancreas are within normal limits. The kidneys are well visualized bilaterally and reveal no obstructive change or renal calculi. Stomach is well distended. No reflux or reflux esophagitis is seen. The appendix is within normal limits. The bladder is well distended. Mild ovarian cystic changes noted stable from the previous exam. No free fluid is seen. No bony abnormality is noted.  IMPRESSION: No acute abnormality noted.   Electronically Signed   By: Inez Catalina M.D.   On: 08/19/2014 19:40     EKG Interpretation None      MDM   Final diagnoses:  Rectal bleeding    Patient with episode of rectal bleeding following hard bowel movement mostly blood was told paper. The commode water was not stained completely red. Patient had a bowel movement here has more relief. Patient's CT scan done yesterday showed no evidence of any constipation. Patient with no significant bleeding. Rectal exam without gross blood. Patient to be discharged home with precautions. Patient already referred to GI medicine for follow-up of epigastric abdominal pain that occurred  yesterday.   I personally performed the services described in this documentation, which was scribed in my presence. The  recorded information has been reviewed and is accurate.       Mckenzie Sorrow, MD 08/20/14 [phone]

## 2014-09-18 ENCOUNTER — Ambulatory Visit: Payer: Medicaid Other | Attending: Internal Medicine

## 2014-09-27 ENCOUNTER — Encounter: Payer: Self-pay | Admitting: Family Medicine

## 2014-09-27 ENCOUNTER — Ambulatory Visit: Payer: Medicaid Other | Attending: Family Medicine | Admitting: Family Medicine

## 2014-09-27 VITALS — BP 123/75 | HR 58 | Temp 97.8°F | Resp 16 | Ht 60.0 in | Wt 220.0 lb

## 2014-09-27 DIAGNOSIS — R1013 Epigastric pain: Secondary | ICD-10-CM | POA: Insufficient documentation

## 2014-09-27 DIAGNOSIS — Z114 Encounter for screening for human immunodeficiency virus [HIV]: Secondary | ICD-10-CM

## 2014-09-27 DIAGNOSIS — K5909 Other constipation: Secondary | ICD-10-CM | POA: Insufficient documentation

## 2014-09-27 DIAGNOSIS — Z6841 Body Mass Index (BMI) 40.0 and over, adult: Secondary | ICD-10-CM | POA: Insufficient documentation

## 2014-09-27 DIAGNOSIS — Z720 Tobacco use: Secondary | ICD-10-CM

## 2014-09-27 DIAGNOSIS — K219 Gastro-esophageal reflux disease without esophagitis: Secondary | ICD-10-CM

## 2014-09-27 DIAGNOSIS — K5900 Constipation, unspecified: Secondary | ICD-10-CM

## 2014-09-27 DIAGNOSIS — F17200 Nicotine dependence, unspecified, uncomplicated: Secondary | ICD-10-CM | POA: Insufficient documentation

## 2014-09-27 DIAGNOSIS — E282 Polycystic ovarian syndrome: Secondary | ICD-10-CM | POA: Insufficient documentation

## 2014-09-27 DIAGNOSIS — G8929 Other chronic pain: Secondary | ICD-10-CM

## 2014-09-27 HISTORY — DX: Polycystic ovarian syndrome: E28.2

## 2014-09-27 LAB — HEMOCCULT GUIAC POC 1CARD (OFFICE): FECAL OCCULT BLD: NEGATIVE

## 2014-09-27 NOTE — Assessment & Plan Note (Addendum)
Smoking: you must quit now while you are young.  Smoking cessation support: smoking cessation hotline: 1-800-QUIT-NOW.  Smoking cessation classes are available through [facility]. Call [phone] or visit our website at [email or web address] Start the nicotine patch" 21 mg for 6 weeks 14 mg for 2 weeks 7 mg for 2 weeks

## 2014-09-27 NOTE — Assessment & Plan Note (Signed)
Screening HIV today  

## 2014-09-27 NOTE — Assessment & Plan Note (Addendum)
A: intermittent constipation with hx of hemorrhoids. Positive blood test in stool on 08/20/14, negative today. P:  Avoid constipation my taking a stool softener as needed. Eat a high fiber diet Drink plenty of water

## 2014-09-27 NOTE — Progress Notes (Signed)
   Subjective:    Patient ID: Mckenzie Cooley, female    DOB: 22-Sep-1990, 24 y.o.   MRN: [id number] CC: epigastric pain, requesting GI referral  HPI  1. Epigastric pain: x 3 months. Improving. Associated with heartburn and constipation. Taking zantac. Carafate helped a bit. Exacerbated by large meals. Has a normal CT scan of abdomen in 07/2014. Has hx of GERD since 2011. S/p lap chole. S/p EGD x 2. Denies ETOH.   Soc Hx: current smoker 1 PPD x 10 years Med Hx: GERD, PCOS Surg Hx: s/p lap chole  Review of Systems  Constitutional: Negative for fever, chills and unexpected weight change.  Cardiovascular: Positive for chest pain. Negative for palpitations and leg swelling.       L sided associated with GERD  Gastrointestinal: Positive for nausea, abdominal pain and constipation. Negative for vomiting, diarrhea, blood in stool, abdominal distention, anal bleeding and rectal pain.      Objective:   Physical Exam BP 123/75 mmHg  Pulse 58  Temp(Src) 97.8 F (36.6 C) (Oral)  Resp 16  Ht 5' (1.524 m)  Wt 220 lb (99.791 kg)  BMI 42.97 kg/m2  SpO2 100%  LMP 09/21/2014 General appearance: alert, cooperative, no distress and morbidly obese Throat: lips, mucosa, and tongue normal; teeth and gums normal Lungs: clear to auscultation bilaterally Heart: regular rate and rhythm, S1, S2 normal, no murmur, click, rub or gallop Abdomen: normal findings: bowel sounds normal, no masses palpable, no organomegaly, no scars, striae, dilated veins, rashes, or lesions, symmetric and umbilicus normal and abnormal findings:  mild tenderness in the epigastrium  Rectal: hypertrophied skin consistent with previous hemorrhoids, no thrombosis, mass, lesions, bleeding point. Normal rectal tone. FOBT: negative        Assessment & Plan:

## 2014-09-27 NOTE — Patient Instructions (Addendum)
Mrs. Lemmons,  Thank you for coming in today. It was a pleasure meeting you. I look forward to being your primary doctor.  1. Epigastric pain since January Continue zantac Quit smoking  Referral to GI placed.  you will be called with appt details   2. Positive blood test in stool on 08/20/14, negative today. Avoid constipation my taking a stool softener as needed. Eat a high fiber diet Drink plenty of water.  3. Smoking: you must quit now while you are young.  Smoking cessation support: smoking cessation hotline: 1-800-QUIT-NOW.  Smoking cessation classes are available through [facility]. Call [phone] or visit our website at [email or web address] Start the nicotine patch" 21 mg for 6 weeks 14 mg for 2 weeks 7 mg for 2 weeks   4. Healthcare maintenance: Pap due Screening HIV done today Tdap done in 2012 per your report  F/u in 6-8 weeks for pap smear Dr. Adrian Blackwater

## 2014-09-27 NOTE — Assessment & Plan Note (Signed)
1. Epigastric pain since January Continue zantac Quit smoking  Referral to GI placed.  you will be called with appt details

## 2014-09-27 NOTE — Progress Notes (Signed)
Establish Care Referral to GI

## 2014-09-28 LAB — HIV ANTIBODY (ROUTINE TESTING W REFLEX): HIV 1&2 Ab, 4th Generation: NONREACTIVE

## 2014-10-04 ENCOUNTER — Telehealth: Payer: Self-pay | Admitting: *Deleted

## 2014-10-04 NOTE — Telephone Encounter (Signed)
Pt aware of results 

## 2014-10-04 NOTE — Telephone Encounter (Signed)
-----   Message from Boykin Nearing, MD sent at 09/28/2014  9:01 AM EDT ----- Screening HIV negative

## 2014-10-17 ENCOUNTER — Encounter (INDEPENDENT_AMBULATORY_CARE_PROVIDER_SITE_OTHER): Payer: Self-pay | Admitting: *Deleted

## 2014-10-26 ENCOUNTER — Observation Stay (HOSPITAL_COMMUNITY)
Admission: EM | Admit: 2014-10-26 | Discharge: 2014-10-27 | DRG: 392 | Disposition: A | Discharge status: Home / Self Care | Payer: Medicaid Other | Attending: Internal Medicine | Admitting: Internal Medicine

## 2014-10-26 ENCOUNTER — Encounter (HOSPITAL_COMMUNITY)
Admission: EM | Disposition: A | Discharge status: Home / Self Care | Payer: Self-pay | Source: Home / Self Care | Attending: Internal Medicine

## 2014-10-26 ENCOUNTER — Encounter (HOSPITAL_COMMUNITY): Payer: Self-pay

## 2014-10-26 DIAGNOSIS — Z6841 Body Mass Index (BMI) 40.0 and over, adult: Secondary | ICD-10-CM

## 2014-10-26 DIAGNOSIS — R634 Abnormal weight loss: Secondary | ICD-10-CM

## 2014-10-26 DIAGNOSIS — R109 Unspecified abdominal pain: Secondary | ICD-10-CM | POA: Diagnosis present

## 2014-10-26 DIAGNOSIS — R10811 Right upper quadrant abdominal tenderness: Secondary | ICD-10-CM

## 2014-10-26 DIAGNOSIS — K5900 Constipation, unspecified: Secondary | ICD-10-CM

## 2014-10-26 DIAGNOSIS — R6881 Early satiety: Secondary | ICD-10-CM

## 2014-10-26 DIAGNOSIS — E669 Obesity, unspecified: Secondary | ICD-10-CM | POA: Diagnosis present

## 2014-10-26 DIAGNOSIS — F17210 Nicotine dependence, cigarettes, uncomplicated: Secondary | ICD-10-CM | POA: Diagnosis present

## 2014-10-26 DIAGNOSIS — K219 Gastro-esophageal reflux disease without esophagitis: Secondary | ICD-10-CM

## 2014-10-26 DIAGNOSIS — Z9049 Acquired absence of other specified parts of digestive tract: Secondary | ICD-10-CM | POA: Diagnosis present

## 2014-10-26 DIAGNOSIS — R10816 Epigastric abdominal tenderness: Secondary | ICD-10-CM

## 2014-10-26 DIAGNOSIS — Z3202 Encounter for pregnancy test, result negative: Secondary | ICD-10-CM | POA: Diagnosis present

## 2014-10-26 DIAGNOSIS — R112 Nausea with vomiting, unspecified: Secondary | ICD-10-CM

## 2014-10-26 DIAGNOSIS — J45909 Unspecified asthma, uncomplicated: Secondary | ICD-10-CM | POA: Diagnosis present

## 2014-10-26 DIAGNOSIS — Z791 Long term (current) use of non-steroidal anti-inflammatories (NSAID): Secondary | ICD-10-CM

## 2014-10-26 DIAGNOSIS — K3184 Gastroparesis: Secondary | ICD-10-CM

## 2014-10-26 DIAGNOSIS — R1013 Epigastric pain: Secondary | ICD-10-CM | POA: Diagnosis present

## 2014-10-26 DIAGNOSIS — R10815 Periumbilic abdominal tenderness: Secondary | ICD-10-CM

## 2014-10-26 HISTORY — PX: ESOPHAGOGASTRODUODENOSCOPY: SHX5428

## 2014-10-26 LAB — CBC WITH DIFFERENTIAL/PLATELET
Basophils Absolute: 0 10*3/uL (ref 0.0–0.1)
Basophils Relative: 0 % (ref 0–1)
Eosinophils Absolute: 0.2 10*3/uL (ref 0.0–0.7)
Eosinophils Relative: 2 % (ref 0–5)
HCT: 45.4 % (ref 36.0–46.0)
HEMOGLOBIN: 15.5 g/dL — AB (ref 12.0–15.0)
Lymphocytes Relative: 30 % (ref 12–46)
Lymphs Abs: 3.3 10*3/uL (ref 0.7–4.0)
MCH: 29.2 pg (ref 26.0–34.0)
MCHC: 34.1 g/dL (ref 30.0–36.0)
MCV: 85.7 fL (ref 78.0–100.0)
Monocytes Absolute: 1 10*3/uL (ref 0.1–1.0)
Monocytes Relative: 9 % (ref 3–12)
NEUTROS ABS: 6.7 10*3/uL (ref 1.7–7.7)
Neutrophils Relative %: 59 % (ref 43–77)
PLATELETS: 355 10*3/uL (ref 150–400)
RBC: 5.3 MIL/uL — AB (ref 3.87–5.11)
RDW: 12.7 % (ref 11.5–15.5)
WBC: 11.2 10*3/uL — AB (ref 4.0–10.5)

## 2014-10-26 LAB — URINALYSIS, ROUTINE W REFLEX MICROSCOPIC
BILIRUBIN URINE: NEGATIVE
Glucose, UA: NEGATIVE mg/dL
Ketones, ur: NEGATIVE mg/dL
LEUKOCYTES UA: NEGATIVE
Nitrite: NEGATIVE
Protein, ur: NEGATIVE mg/dL
SPECIFIC GRAVITY, URINE: 1.02 (ref 1.005–1.030)
UROBILINOGEN UA: 0.2 mg/dL (ref 0.0–1.0)
pH: 7.5 (ref 5.0–8.0)

## 2014-10-26 LAB — COMPREHENSIVE METABOLIC PANEL
ALBUMIN: 4.2 g/dL (ref 3.5–5.0)
ALT: 24 U/L (ref 14–54)
ANION GAP: 7 (ref 5–15)
AST: 19 U/L (ref 15–41)
Alkaline Phosphatase: 74 U/L (ref 38–126)
BUN: 10 mg/dL (ref 6–20)
CALCIUM: 9.3 mg/dL (ref 8.9–10.3)
CO2: 30 mmol/L (ref 22–32)
CREATININE: 0.84 mg/dL (ref 0.44–1.00)
Chloride: 101 mmol/L (ref 101–111)
GFR calc non Af Amer: >60 mL/min (ref >60)
Glucose, Bld: 108 mg/dL — ABNORMAL HIGH (ref 70–99)
Potassium: 4.1 mmol/L (ref 3.5–5.1)
Sodium: 138 mmol/L (ref 135–145)
TOTAL PROTEIN: 8 g/dL (ref 6.5–8.1)
Total Bilirubin: 0.4 mg/dL (ref 0.3–1.2)

## 2014-10-26 LAB — URINE MICROSCOPIC-ADD ON

## 2014-10-26 LAB — TSH: TSH: 3.687 u[IU]/mL (ref 0.350–4.500)

## 2014-10-26 LAB — PREGNANCY, URINE: PREG TEST UR: NEGATIVE

## 2014-10-26 LAB — LIPASE, BLOOD: Lipase: 23 U/L (ref 22–51)

## 2014-10-26 SURGERY — EGD (ESOPHAGOGASTRODUODENOSCOPY)
Anesthesia: Moderate Sedation

## 2014-10-26 MED ORDER — METOCLOPRAMIDE HCL 5 MG/ML IJ SOLN
10.0000 mg | Freq: Four times a day (QID) | INTRAMUSCULAR | Status: DC
  Administered 2014-10-26 – 2014-10-27 (×3): 10 mg via INTRAVENOUS
  Filled 2014-10-26 (×3): qty 2

## 2014-10-26 MED ORDER — SODIUM CHLORIDE 0.9 % IV SOLN
INTRAVENOUS | Status: DC
  Administered 2014-10-26: 06:00:00 via INTRAVENOUS

## 2014-10-26 MED ORDER — ENOXAPARIN SODIUM 40 MG/0.4ML ~~LOC~~ SOLN
40.0000 mg | SUBCUTANEOUS | Status: DC
  Administered 2014-10-26 – 2014-10-27 (×2): 40 mg via SUBCUTANEOUS
  Filled 2014-10-26 (×2): qty 0.4

## 2014-10-26 MED ORDER — MIDAZOLAM HCL 5 MG/5ML IJ SOLN
INTRAMUSCULAR | Status: DC | PRN
  Administered 2014-10-26 (×2): 2 mg via INTRAVENOUS

## 2014-10-26 MED ORDER — STERILE WATER FOR IRRIGATION IR SOLN
Status: DC | PRN
  Administered 2014-10-26: 16:00:00

## 2014-10-26 MED ORDER — SODIUM CHLORIDE 0.9 % IV SOLN: INTRAVENOUS | Status: DC

## 2014-10-26 MED ORDER — HYDROMORPHONE HCL 2 MG/ML IJ SOLN
2.0000 mg | Freq: Once | INTRAMUSCULAR | Status: AC
  Administered 2014-10-26: 2 mg via INTRAMUSCULAR
  Filled 2014-10-26: qty 1

## 2014-10-26 MED ORDER — HYDROMORPHONE HCL 1 MG/ML IJ SOLN
1.0000 mg | INTRAMUSCULAR | Status: DC | PRN
  Administered 2014-10-26: 1 mg via INTRAVENOUS
  Filled 2014-10-26: qty 1

## 2014-10-26 MED ORDER — MEPERIDINE HCL 50 MG/ML IJ SOLN
INTRAMUSCULAR | Status: AC
  Filled 2014-10-26: qty 1

## 2014-10-26 MED ORDER — ONDANSETRON HCL 4 MG/2ML IJ SOLN: 4.0000 mg | Freq: Four times a day (QID) | INTRAMUSCULAR | Status: DC | PRN

## 2014-10-26 MED ORDER — PANTOPRAZOLE SODIUM 40 MG IV SOLR
40.0000 mg | Freq: Two times a day (BID) | INTRAVENOUS | Status: DC
  Administered 2014-10-26 (×2): 40 mg via INTRAVENOUS
  Filled 2014-10-26 (×2): qty 40

## 2014-10-26 MED ORDER — ONDANSETRON HCL 4 MG PO TABS: 4.0000 mg | ORAL_TABLET | Freq: Four times a day (QID) | ORAL | Status: DC | PRN

## 2014-10-26 MED ORDER — KCL IN DEXTROSE-NACL 20-5-0.45 MEQ/L-%-% IV SOLN
INTRAVENOUS | Status: DC
  Administered 2014-10-27: 100 mL/h via INTRAVENOUS

## 2014-10-26 MED ORDER — BUTAMBEN-TETRACAINE-BENZOCAINE 2-2-14 % EX AERO
INHALATION_SPRAY | CUTANEOUS | Status: DC | PRN
  Administered 2014-10-26: 2 via TOPICAL

## 2014-10-26 MED ORDER — ONDANSETRON HCL 4 MG/2ML IJ SOLN
4.0000 mg | Freq: Once | INTRAMUSCULAR | Status: AC
  Administered 2014-10-26: 4 mg via INTRAVENOUS

## 2014-10-26 MED ORDER — HYDROMORPHONE HCL 1 MG/ML IJ SOLN
1.0000 mg | Freq: Once | INTRAMUSCULAR | Status: DC
  Filled 2014-10-26: qty 1

## 2014-10-26 MED ORDER — POLYETHYLENE GLYCOL 3350 17 GM/SCOOP PO POWD: 17.0000 g | Freq: Every day | ORAL | Status: DC

## 2014-10-26 MED ORDER — ONDANSETRON HCL 4 MG/2ML IJ SOLN
INTRAMUSCULAR | Status: AC
  Administered 2014-10-26: 4 mg via INTRAVENOUS
  Filled 2014-10-26: qty 2

## 2014-10-26 MED ORDER — HYDROMORPHONE HCL 2 MG PO TABS: 2.0000 mg | ORAL_TABLET | Freq: Four times a day (QID) | ORAL | Status: DC | PRN

## 2014-10-26 MED ORDER — PSYLLIUM 28 % PO PACK: 1.0000 | PACK | Freq: Two times a day (BID) | ORAL | Status: DC

## 2014-10-26 MED ORDER — IBUPROFEN 800 MG PO TABS: 400.0000 mg | ORAL_TABLET | ORAL | Status: DC | PRN

## 2014-10-26 MED ORDER — MEPERIDINE HCL 50 MG/ML IJ SOLN
INTRAMUSCULAR | Status: DC | PRN
  Administered 2014-10-26 (×2): 25 mg via INTRAVENOUS

## 2014-10-26 MED ORDER — PROMETHAZINE HCL 25 MG/ML IJ SOLN
25.0000 mg | Freq: Once | INTRAMUSCULAR | Status: AC
  Administered 2014-10-26: 25 mg via INTRAMUSCULAR
  Filled 2014-10-26: qty 1

## 2014-10-26 MED ORDER — ALBUTEROL SULFATE (2.5 MG/3ML) 0.083% IN NEBU: 3.0000 mL | INHALATION_SOLUTION | Freq: Four times a day (QID) | RESPIRATORY_TRACT | Status: DC | PRN

## 2014-10-26 MED ORDER — MIDAZOLAM HCL 5 MG/5ML IJ SOLN
INTRAMUSCULAR | Status: AC
  Filled 2014-10-26: qty 10

## 2014-10-26 MED ORDER — BISACODYL 5 MG PO TBEC: 5.0000 mg | DELAYED_RELEASE_TABLET | Freq: Every day | ORAL | Status: DC | PRN

## 2014-10-26 MED ORDER — GI COCKTAIL ~~LOC~~
30.0000 mL | Freq: Once | ORAL | Status: AC
  Administered 2014-10-26: 30 mL via ORAL
  Filled 2014-10-26: qty 30

## 2014-10-26 NOTE — Discharge Instructions (Signed)
Abdominal Pain, Women °Abdominal (stomach, pelvic, or belly) pain can be caused by many things. It is important to tell your doctor: °· The location of the pain. °· Does it come and go or is it present all the time? °· Are there things that start the pain (eating certain foods, exercise)? °· Are there other symptoms associated with the pain (fever, nausea, vomiting, diarrhea)? °All of this is helpful to know when trying to find the cause of the pain. °CAUSES  °· Stomach: virus or bacteria infection, or ulcer. °· Intestine: appendicitis (inflamed appendix), regional ileitis (Crohn's disease), ulcerative colitis (inflamed colon), irritable bowel syndrome, diverticulitis (inflamed diverticulum of the colon), or cancer of the stomach or intestine. °· Gallbladder disease or stones in the gallbladder. °· Kidney disease, kidney stones, or infection. °· Pancreas infection or cancer. °· Fibromyalgia (pain disorder). °· Diseases of the female organs: °¨ Uterus: fibroid (non-cancerous) tumors or infection. °¨ Fallopian tubes: infection or tubal pregnancy. °¨ Ovary: cysts or tumors. °¨ Pelvic adhesions (scar tissue). °¨ Endometriosis (uterus lining tissue growing in the pelvis and on the pelvic organs). °¨ Pelvic congestion syndrome (female organs filling up with blood just before the menstrual period). °¨ Pain with the menstrual period. °¨ Pain with ovulation (producing an egg). °¨ Pain with an IUD (intrauterine device, birth control) in the uterus. °¨ Cancer of the female organs. °· Functional pain (pain not caused by a disease, may improve without treatment). °· Psychological pain. °· Depression. °DIAGNOSIS  °Your doctor will decide the seriousness of your pain by doing an examination. °· Blood tests. °· X-rays. °· Ultrasound. °· CT scan (computed tomography, special type of X-ray). °· MRI (magnetic resonance imaging). °· Cultures, for infection. °· Barium enema (dye inserted in the large intestine, to better view it with  X-rays). °· Colonoscopy (looking in intestine with a lighted tube). °· Laparoscopy (minor surgery, looking in abdomen with a lighted tube). °· Major abdominal exploratory surgery (looking in abdomen with a large incision). °TREATMENT  °The treatment will depend on the cause of the pain.  °· Many cases can be observed and treated at home. °· Over-the-counter medicines recommended by your caregiver. °· Prescription medicine. °· Antibiotics, for infection. °· Birth control pills, for painful periods or for ovulation pain. °· Hormone treatment, for endometriosis. °· Nerve blocking injections. °· Physical therapy. °· Antidepressants. °· Counseling with a psychologist or psychiatrist. °· Minor or major surgery. °HOME CARE INSTRUCTIONS  °· Do not take laxatives, unless directed by your caregiver. °· Take over-the-counter pain medicine only if ordered by your caregiver. Do not take aspirin because it can cause an upset stomach or bleeding. °· Try a clear liquid diet (broth or water) as ordered by your caregiver. Slowly move to a bland diet, as tolerated, if the pain is related to the stomach or intestine. °· Have a thermometer and take your temperature several times a day, and record it. °· Bed rest and sleep, if it helps the pain. °· Avoid sexual intercourse, if it causes pain. °· Avoid stressful situations. °· Keep your follow-up appointments and tests, as your caregiver orders. °· If the pain does not go away with medicine or surgery, you may try: °¨ Acupuncture. °¨ Relaxation exercises (yoga, meditation). °¨ Group therapy. °¨ Counseling. °SEEK MEDICAL CARE IF:  °· You notice certain foods cause stomach pain. °· Your home care treatment is not helping your pain. °· You need stronger pain medicine. °· You want your IUD removed. °· You feel faint or   lightheaded. °· You develop nausea and vomiting. °· You develop a rash. °· You are having side effects or an allergy to your medicine. °SEEK IMMEDIATE MEDICAL CARE IF:  °· Your  pain does not go away or gets worse. °· You have a fever. °· Your pain is felt only in portions of the abdomen. The right side could possibly be appendicitis. The left lower portion of the abdomen could be colitis or diverticulitis. °· You are passing blood in your stools (bright red or black tarry stools, with or without vomiting). °· You have blood in your urine. °· You develop chills, with or without a fever. °· You pass out. °MAKE SURE YOU:  °· Understand these instructions. °· Will watch your condition. °· Will get help right away if you are not doing well or get worse. °Document Released: 04/05/2007 Document Revised: 10/23/2013 Document Reviewed: 04/25/2009 °Exit[employer]® Patient Information ©2015 [employer]. This information is not intended to replace advice given to you by your health care provider. Make sure you discuss any questions you have with your health care provider. ° °

## 2014-10-26 NOTE — Op Note (Signed)
EGD PROCEDURE REPORT  PATIENT:  Mckenzie Cooley  MR#:  [id number] Birthdate:  [birth year], 24 y.o., female Endoscopist:  Dr. Rogene Houston, MD Referred By:  Dr. Monica Martinez, MD  Procedure Date: 10/26/2014  Procedure:   EGD  Indications:  Patient is 24 year old Caucasian female who presents with three-month history of epigastric pain nausea early satiety and 10 pound weight loss. She takes Advil 6 and milligrams 3 times a week for headache.            Informed Consent:  The risks, benefits, alternatives & imponderables which include, but are not limited to, bleeding, infection, perforation, drug reaction and potential missed lesion have been reviewed.  The potential for biopsy, lesion removal, esophageal dilation, etc. have also been discussed.  Questions have been answered.  All parties agreeable.  Please see history & physical in medical record for more information.  Medications:  Demerol 50 mg IV Versed 4 mg IV Cetacaine spray topically for oropharyngeal anesthesia  Description of procedure:  The endoscope was introduced through the mouth and advanced to the second portion of the duodenum without difficulty or limitations. The mucosal surfaces were surveyed very carefully during advancement of the scope and upon withdrawal.  Findings:  Esophagus:  Mucosa of the esophagus is normal. GE junction unremarkable. GEJ:  38 cm Stomach:  Large amount of food debris noted in the stomach. Therefore examination was incomplete. Part of the mucosa that was seen at gastric body and antrum as well as annularis fundus and cardia was normal. Pyloric channel was patent. Duodenum:  Normal bulbar and post bulbar mucosa.  Therapeutic/Diagnostic Maneuvers Performed:  None  Complications:  None  Impression: Large amount of food debris with patent pylorus suggestive of gastroparesis. No evidence of erosive esophagitis or peptic ulcer disease.  Recommendations:  Clear liquids. Metoclopramide 10 mg IV  every 6 hours. Await results of TSH. Solid-phase gastric emptying study off promotility agent on an outpatient basis.  [name] U  10/26/2014  4:17 PM  CC: Dr. Minerva Ends, MD & Dr. Rayne Du ref. provider found

## 2014-10-26 NOTE — Consult Note (Signed)
Mckenzie Cooley is an 24 y.o. female.  HPI: Admitted thru the ED this am.  She c/o epigastric pain. She has had pain since 9-10pm last night.  She says she has actually been hurting since January. The pain is not related to food.  She describes the pain as a tightness. The pain comes and goes.  The pain will last about 2 minutes and then reoccur every few minutes. She says she has acid reflux but not every day. She takes OTC Zantac for acid reflux which helps.  Appetite is good for the most part.  Sh snacks during the day. If she eats a whole meal, it feels like it sits in her stomach.  She will become constipated if she eats a regular meal. She has nausea but no vomiting every day. She is not a diabetic.  She usually has a BM once a week. She takes a liquid laxative as needed. She takes a stool softener every day. Her last EGD was in 2011 by Dr. Laural Golden as was normal.   Past Medical History  Diagnosis Date  . Acid reflux Dx 2011  . Bloating 2011  . Weight loss, abnormal   . Asthma     As a Child  . PCOS (polycystic ovarian syndrome) Dx 2011    Past Surgical History  Procedure Laterality Date  . Upper gastrointestinal endoscopy  12/05/2009  . Upper gastrointestinal endoscopy  02/07/2009  . Cholecystectomy    . Wisdom tooth extraction      Family History  Problem Relation Age of Onset  . Renal Disease Other     Social History:  reports that she has been smoking Cigarettes.  She has been smoking about 1.00 pack per day. She does not have any smokeless tobacco history on file. She reports that she does not drink alcohol or use illicit drugs.  Allergies:  Allergies  Allergen Reactions  . Percocet [Oxycodone-Acetaminophen] Hives and Itching  . Amoxicillin Rash    Medications: I have reviewed the patient's current medications.  Results for orders placed or performed during the hospital encounter of 10/26/14 (from the past 48 hour(s))  Urinalysis,  Routine w reflex microscopic     Status: Abnormal   Collection Time: 10/26/14  4:10 AM  Result Value Ref Range   Color, Urine YELLOW YELLOW   APPearance HAZY (A) CLEAR   Specific Gravity, Urine 1.020 1.005 - 1.030   pH 7.5 5.0 - 8.0   Glucose, UA NEGATIVE NEGATIVE mg/dL   Hgb urine dipstick TRACE (A) NEGATIVE   Bilirubin Urine NEGATIVE NEGATIVE   Ketones, ur NEGATIVE NEGATIVE mg/dL   Protein, ur NEGATIVE NEGATIVE mg/dL   Urobilinogen, UA 0.2 0.0 - 1.0 mg/dL   Nitrite NEGATIVE NEGATIVE   Leukocytes, UA NEGATIVE NEGATIVE  Pregnancy, urine     Status: None   Collection Time: 10/26/14  4:10 AM  Result Value Ref Range   Preg Test, Ur NEGATIVE NEGATIVE    Comment:        THE SENSITIVITY OF THIS METHODOLOGY IS >20 mIU/mL.   Urine microscopic-add on     Status: Abnormal   Collection Time: 10/26/14  4:10 AM  Result Value Ref Range   Squamous Epithelial / LPF MANY (A) RARE   RBC / HPF 0-2 <3 RBC/hpf   Bacteria, UA MANY (A) RARE   Urine-Other AMORPHOUS URATES/PHOSPHATES   Comprehensive metabolic panel     Status: Abnormal   Collection Time: 10/26/14  4:12 AM  Result Value Ref Range   Sodium 138 135 - 145 mmol/L   Potassium 4.1 3.5 - 5.1 mmol/L   Chloride 101 101 - 111 mmol/L   CO2 30 22 - 32 mmol/L   Glucose, Bld 108 (H) 70 - 99 mg/dL   BUN 10 6 - 20 mg/dL   Creatinine, Ser 0.84 0.44 - 1.00 mg/dL   Calcium 9.3 8.9 - 10.3 mg/dL   Total Protein 8.0 6.5 - 8.1 g/dL   Albumin 4.2 3.5 - 5.0 g/dL   AST 19 15 - 41 U/L   ALT 24 14 - 54 U/L   Alkaline Phosphatase 74 38 - 126 U/L   Total Bilirubin 0.4 0.3 - 1.2 mg/dL   GFR calc non Af Amer >60 >60 mL/min   GFR calc Af Amer >60 >60 mL/min    Comment: (NOTE) The eGFR has been calculated using the CKD EPI equation. This calculation has not been validated in all clinical situations. eGFR's persistently <60 mL/min signify possible Chronic Kidney Disease.    Anion gap 7 5 - 15  Lipase, blood     Status: None   Collection Time: 10/26/14   4:12 AM  Result Value Ref Range   Lipase 23 22 - 51 U/L  CBC with Differential     Status: Abnormal   Collection Time: 10/26/14  4:12 AM  Result Value Ref Range   WBC 11.2 (H) 4.0 - 10.5 K/uL   RBC 5.30 (H) 3.87 - 5.11 MIL/uL   Hemoglobin 15.5 (H) 12.0 - 15.0 g/dL   HCT 45.4 36.0 - 46.0 %   MCV 85.7 78.0 - 100.0 fL   MCH 29.2 26.0 - 34.0 pg   MCHC 34.1 30.0 - 36.0 g/dL   RDW 12.7 11.5 - 15.5 %   Platelets 355 150 - 400 K/uL   Neutrophils Relative % 59 43 - 77 %   Neutro Abs 6.7 1.7 - 7.7 K/uL   Lymphocytes Relative 30 12 - 46 %   Lymphs Abs 3.3 0.7 - 4.0 K/uL   Monocytes Relative 9 3 - 12 %   Monocytes Absolute 1.0 0.1 - 1.0 K/uL   Eosinophils Relative 2 0 - 5 %   Eosinophils Absolute 0.2 0.0 - 0.7 K/uL   Basophils Relative 0 0 - 1 %   Basophils Absolute 0.0 0.0 - 0.1 K/uL    No results found.  ROS Blood pressure 111/66, pulse 67, temperature 97.8 F (36.6 C), temperature source Oral, resp. rate 17, height 5' (1.524 m), weight 217 lb (98.431 kg), last menstrual period 09/30/2014, SpO2 98 %. Physical Exam Alert and oriented. Skin warm and dry. Oral mucosa is moist.   . Sclera anicteric, conjunctivae is pink. Thyroid not enlarged. No cervical lymphadenopathy. Lungs clear. Heart regular rate and rhythm.  Abdomen is soft. Bowel sounds are positive. No hepatomegaly. No abdominal masses felt. Epigastric tenderness.  No edema to lower extremities. Assessment/Plan: GERD uncontrolled at this time. EGD this pm. Agree with Protonix 65m IV. Will discuss with Dr. RLaural Golden   SETZER,TERRI W 10/26/2014, 10:46 AM    GI attending note; Patient interviewed and examined. Lab studies reviewed. Patient is 24year old Caucasian female who presents with three-month history of epigastric pain made worse with foods associated with intractable nausea early satiety and 10 pound weight loss. Lab studies are unremarkable. Patient takes Advil 6 and milligrams 2-3 times a week for migraine. Exam  pertinent fo mild tenderness in epigastrium and right upper quadrant.  Symptoms are suggestive of  peptic ulcer disease but she could also have GERD.  Patient is agreeable to proceed with EGD.

## 2014-10-26 NOTE — Progress Notes (Signed)
[facility] PROGRESS NOTE  Mckenzie Cooley YHC:[id number] DOB: 21-Sep-1990 DOA: 10/26/2014 PCP: Minerva Ends, MD  Brief summary  24 year old female who has a past medical history of Acid reflux (Dx 2011); Bloating (2011); Weight loss, abnormal; Asthma; and PCOS (polycystic ovarian syndrome) (Dx 2011). today came to the ED with chief complaint of abdominal pain in the epigastric region. The pain occurs episodically which started from January of this year. Patient has a history of polycystic ovarian disease, status post cholecystectomy in 2011. Patient says that she was not having any pain since 2011 and started out of the blue. She also complains of heartburn with nausea but no vomiting or diarrhea.  She also complains of chest pain when the pain becomes worse. She had an appointment to see gastroenterologist on 26th but the pain was so severe that she could not read that long. Patient had multiple CT scans in the past last CT scan abdomen in February showed no acute abnormality. Patient had endoscopy in the past which did did not reveal any significant abnormality.  Assessment/Plan  Abdominal pain, sounds to be related to new onset constipation which started in January -  Pregnancy test negative -  LFTs and lipase wnl -  She reports she has had to perform rectal disimpaction and she has had minimal relief with stool softeners -  Recommend miralax BID to soften stools -  TSH  -  Check urine GC/Ch to eval for PID -  Appreciate GI assistance  Chest pain sounds partly musculoskeletal and she has some point tenderness over her xyphoid and along right sternocostal joints suggestive of costochondritis -  Avoid NSAIDS for now  Heartburn/GERD  -  Continue PPI  Early and lasting satiety suggestive of gastroparesis, but she does not have diabetes -  EGD demonstrated a large volume of food even though she has not eaten in many hours which also suggests gastroparesis -  Reglan started by  GI -  She will need follow up with GI in about 2 weeks for solid gastric emptying study, reglan to stop 24 hours before study -  Minimize narcotics  Diet:  Per GI Access:  PIV IVF:  yes Proph:  lovenox  Code Status: full Family Communication: patient alone  Disposition Plan: likely home tomorrow once    Consultants:  GI  Procedures:  EGD on 5/6  Antibiotics:  none   HPI/Subjective:  Has nausea without vomiting.  Constipation with hard stools, still having some abdominal discomfort     Objective: Filed Vitals:   10/26/14 1605 10/26/14 1610 10/26/14 1615 10/26/14 1630  BP: 119/51 114/56 116/60 126/64  Pulse: 65 62 61 57  Temp:    98.3 F (36.8 C)  TempSrc:    Oral  Resp: 23 24 23 22   Height:      Weight:      SpO2: 100% 99% 99% 97%    Intake/Output Summary (Last 24 hours) at 10/26/14 1642 Last data filed at 10/26/14 1613  Gross per 24 hour  Intake    950 ml  Output      0 ml  Net    950 ml   Filed Weights   10/26/14 0354 10/26/14 0700 10/26/14 1514  Weight: 98.431 kg (217 lb) 98.431 kg (217 lb) 98.431 kg (217 lb)    Exam:   General:  Obese female, No acute distress  HEENT:  NCAT, MMM  Cardiovascular:  RRR, nl S1, S2 no mrg, 2+ pulses, warm extremities  Respiratory:  CTAB, no increased WOB  Abdomen:   NABS, soft, mildly distended, mild diffuse TTP without rebound or guarding  MSK:   Normal tone and bulk, no LEE. TTP over xyphoid and right sternal border  Neuro:  Grossly intact  Data Reviewed: Basic Metabolic Panel:  Recent Labs Lab 10/26/14 0412  NA 138  K 4.1  CL 101  CO2 30  GLUCOSE 108*  BUN 10  CREATININE 0.84  CALCIUM 9.3   Liver Function Tests:  Recent Labs Lab 10/26/14 0412  AST 19  ALT 24  ALKPHOS 74  BILITOT 0.4  PROT 8.0  ALBUMIN 4.2    Recent Labs Lab 10/26/14 0412  LIPASE 23   No results for input(s): AMMONIA in the last 168 hours. CBC:  Recent Labs Lab 10/26/14 0412  WBC 11.2*  NEUTROABS 6.7   HGB 15.5*  HCT 45.4  MCV 85.7  PLT 355   Cardiac Enzymes: No results for input(s): CKTOTAL, CKMB, CKMBINDEX, TROPONINI in the last 168 hours. BNP (last 3 results) No results for input(s): BNP in the last 8760 hours.  ProBNP (last 3 results) No results for input(s): PROBNP in the last 8760 hours.  CBG: No results for input(s): GLUCAP in the last 168 hours.  No results found for this or any previous visit (from the past 240 hour(s)).   Studies: No results found.  Scheduled Meds: . enoxaparin (LOVENOX) injection  40 mg Subcutaneous Q24H  . meperidine      . metoCLOPramide (REGLAN) injection  10 mg Intravenous 4 times per day  . midazolam      . pantoprazole (PROTONIX) IV  40 mg Intravenous Q12H   Continuous Infusions: . dextrose 5 % and 0.45 % NaCl with KCl 20 mEq/L      Active Problems:   GERD (gastroesophageal reflux disease)   Epigastric pain   Abdominal pain    Time spent: 30 min    Erinne Gillentine, [place]  [facility] Pager [phone]. If 7PM-7AM, please contact night-coverage at [email or web address], password [facility] 10/26/2014, 4:42 PM  LOS: 0 days

## 2014-10-26 NOTE — ED Provider Notes (Signed)
CSN: [id number]     Arrival date & time 10/26/14  0344 History   First MD Initiated Contact with Patient 10/26/14 0354     Chief Complaint  Patient presents with  . Abdominal Pain     (Consider location/radiation/quality/duration/timing/severity/associated sxs/prior Treatment) HPI Comments: Patient is a 24 year old female with history of obesity, polycystic ovaries, cholecystectomy. She presents for evaluation of epigastric abdominal pain. This is been occurring episodically for the past several months. She has been seen in the ER multiple times, however workup has always been negative. She has undergone CT scan, multiple laboratory studies, and is awaiting a GI referral to discuss an endoscopy. She tells me this is scheduled for the 26th, however she cannot wait that long. She denies any fevers or chills. She denies any bloody stool, but does report feeling constipated. She denies any urinary complaints.  Patient is a 24 y.o. female presenting with abdominal pain. The history is provided by the patient.  Abdominal Pain Pain location:  Epigastric Pain quality: cramping   Pain radiates to:  Does not radiate Pain severity:  Severe Onset quality:  Gradual Duration: 3 months. Timing:  Intermittent Progression:  Worsening Chronicity:  Recurrent Relieved by:  Nothing Worsened by:  Nothing tried Ineffective treatments:  None tried Associated symptoms: constipation and nausea   Associated symptoms: no chills, no fatigue, no fever and no vomiting     Past Medical History  Diagnosis Date  . Acid reflux Dx 2011  . Bloating 2011  . Weight loss, abnormal   . Asthma     As a Child  . PCOS (polycystic ovarian syndrome) Dx 2011   Past Surgical History  Procedure Laterality Date  . Upper gastrointestinal endoscopy  12/05/2009  . Upper gastrointestinal endoscopy  02/07/2009  . Cholecystectomy    . Wisdom tooth extraction     Family History  Problem Relation Age of Onset  . Renal Disease  Other    History  Substance Use Topics  . Smoking status: Current Some Day Smoker -- 1.00 packs/day    Types: Cigarettes  . Smokeless tobacco: Not on file     Comment: 1 pack a day  . Alcohol Use: No   OB History    No data available     Review of Systems  Constitutional: Negative for fever, chills and fatigue.  Gastrointestinal: Positive for nausea, abdominal pain and constipation. Negative for vomiting.  All other systems reviewed and are negative.     Allergies  Percocet and Amoxicillin  Home Medications   Prior to Admission medications   Medication Sig Start Date End Date Taking? Authorizing Provider  albuterol (PROVENTIL HFA;VENTOLIN HFA) 108 (90 BASE) MCG/ACT inhaler Inhale 1-2 puffs into the lungs every 6 (six) hours as needed for wheezing. 03/15/13  Yes Evalee Jefferson, PA-C  ranitidine (ZANTAC) 150 MG tablet Take 150 mg by mouth daily as needed for heartburn.   Yes Historical Provider, MD   BP 137/82 mmHg  Pulse 75  Temp(Src) 97.7 F (36.5 C) (Oral)  Resp 16  Ht 5' (1.524 m)  Wt 217 lb (98.431 kg)  BMI 42.38 kg/m2  SpO2 100%  LMP 09/30/2014 Physical Exam  Constitutional: She is oriented to person, place, and time. She appears well-developed and well-nourished. No distress.  HENT:  Head: Normocephalic and atraumatic.  Neck: Normal range of motion. Neck supple.  Cardiovascular: Normal rate and regular rhythm.  Exam reveals no gallop and no friction rub.   No murmur heard. Pulmonary/Chest: Effort normal and  breath sounds normal. No respiratory distress. She has no wheezes.  Abdominal: Soft. Bowel sounds are normal. She exhibits no distension. There is tenderness. There is no rebound and no guarding.  There is tenderness to palpation in the epigastric region.  Musculoskeletal: Normal range of motion.  Neurological: She is alert and oriented to person, place, and time.  Skin: Skin is warm and dry. She is not diaphoretic.  Nursing note and vitals reviewed.   ED  Course  Procedures (including critical care time) Labs Review Labs Reviewed  COMPREHENSIVE METABOLIC PANEL  LIPASE, BLOOD  CBC WITH DIFFERENTIAL/PLATELET  URINALYSIS, ROUTINE W REFLEX MICROSCOPIC  PREGNANCY, URINE    Imaging Review No results found.    EKG Interpretation None      MDM   Final diagnoses:  None    Patient with history of episodic epigastric pain for the past several months. She has had a CT scan performed which was unremarkable. She has an appointment with GI scheduled in approximately 3 weeks. She presents tonight complaining that she is having a severe episode which is causing her excruciating pain. This is located in her epigastrium. She is not vomiting and is afebrile. She has a slight white count, however lipase and LFTs are unremarkable. She tells me she is in way too much discomfort for this to go on any longer and has not had any relief with 2 mg of Dilaudid. I've spoken with Dr. Darrick Meigs and the patient will be admitted and undergo GI consultation.    Veryl Speak, MD 10/26/14 [phone]

## 2014-10-26 NOTE — ED Notes (Signed)
Pt reports some chronic abd pain and has an appointment with GI doctor on the 26th, states she was told it was "gatritis"  And has been having pain with this episode since 10 pm

## 2014-10-26 NOTE — ED Notes (Signed)
Dr. Lama at bedside. 

## 2014-10-26 NOTE — H&P (Signed)
PCP:   Minerva Ends, MD   Chief Complaint:  Abdominal pain  HPI: 24 year old female who   has a past medical history of Acid reflux (Dx 2011); Bloating (2011); Weight loss, abnormal; Asthma; and PCOS (polycystic ovarian syndrome) (Dx 2011). today came to the ED with chief complaint of abdominal pain in the epigastric region. The pain occurs episodically which started from January of this year. Patient has a history of polycystic ovarian disease, status post cholecystectomy in 2011. Patient says that she was not having any pain since 2011 and started out of the blue. She also complains of heartburn with nausea but no vomiting or diarrhea. Patient examined at 4 heartburn which does give her diarrhea. She denies any dysuria urgency or frequency of urination. No fever. She also complains of chest pain when the pain becomes worse. She had an appointment to see gastroenterologist on 26th but the pain was so severe that she could not read that long. Patient had multiple CT scans in the past  last CT scan abdomen in February showed no acute abnormality. Patient had endoscopy in the past which did did not reveal any significant abnormality.  Allergies:   Allergies  Allergen Reactions  . Percocet [Oxycodone-Acetaminophen] Hives and Itching  . Amoxicillin Rash      Past Medical History  Diagnosis Date  . Acid reflux Dx 2011  . Bloating 2011  . Weight loss, abnormal   . Asthma     As a Child  . PCOS (polycystic ovarian syndrome) Dx 2011    Past Surgical History  Procedure Laterality Date  . Upper gastrointestinal endoscopy  12/05/2009  . Upper gastrointestinal endoscopy  02/07/2009  . Cholecystectomy    . Wisdom tooth extraction      Prior to Admission medications   Medication Sig Start Date End Date Taking? Authorizing Provider  albuterol (PROVENTIL HFA;VENTOLIN HFA) 108 (90 BASE) MCG/ACT inhaler Inhale 1-2 puffs into the lungs every 6 (six) hours as needed for wheezing. 03/15/13   Yes Evalee Jefferson, PA-C  ranitidine (ZANTAC) 150 MG tablet Take 150 mg by mouth daily as needed for heartburn.   Yes Historical Provider, MD    Social History:  reports that she has been smoking Cigarettes.  She has been smoking about 1.00 pack per day. She does not have any smokeless tobacco history on file. She reports that she does not drink alcohol or use illicit drugs.  Family History  Problem Relation Age of Onset  . Renal Disease Other    Review of Systems:   All the positives are listed in history of present illness rest are  negative      Physical Exam: Blood pressure 132/80, pulse 59, temperature 97.8 F (36.6 C), temperature source Oral, resp. rate 16, height 5' (1.524 m), weight 98.431 kg (217 lb), last menstrual period 09/30/2014, SpO2 98 %. Constitutional:   Patient is a morbidly obese female in no acute distress and cooperative with exam. Head: Normocephalic and atraumatic Mouth: Mucus membranes moist Eyes: PERRL, EOMI, conjunctivae normal Neck: Supple, No Thyromegaly Cardiovascular: RRR, S1 normal, S2 normal Pulmonary/Chest: CTAB, no wheezes, rales, or rhonchi Abdominal: Soft. Tender to palpation, non-distended, bowel sounds are normal, no masses, organomegaly, or guarding present.  Neurological: A&O x3, Strength is normal and symmetric bilaterally, cranial nerve II-XII are grossly intact, no focal motor deficit, sensory intact to light touch bilaterally.  Extremities : No Cyanosis, Clubbing or Edema  Labs on Admission:  Basic Metabolic Panel:  Recent Labs Lab  10/26/14 0412  NA 138  K 4.1  CL 101  CO2 30  GLUCOSE 108*  BUN 10  CREATININE 0.84  CALCIUM 9.3   Liver Function Tests:  Recent Labs Lab 10/26/14 0412  AST 19  ALT 24  ALKPHOS 74  BILITOT 0.4  PROT 8.0  ALBUMIN 4.2    Recent Labs Lab 10/26/14 0412  LIPASE 23   No results for input(s): AMMONIA in the last 168 hours. CBC:  Recent Labs Lab 10/26/14 0412  WBC 11.2*  NEUTROABS  6.7  HGB 15.5*  HCT 45.4  MCV 85.7  PLT 355    Radiological Exams on Admission: No results found.    Assessment/Plan Active Problems:   GERD (gastroesophageal reflux disease)   Epigastric pain   Abdominal pain  Epigastric pain ? Cause, will start IV Protonix 40 mg every 12 hours, Zofran when necessary for nausea and vomiting. Will keep the patient nothing by mouth, Dilaudid when necessary for pain. Will consult gastroenterology in a.m.   Code status: Full code  Family discussion: Admission, patients condition and plan of care including tests being ordered have been discussed with the patient and *her husband at bedside who indicate understanding and agree with the plan and Code Status.   Time Spent on Admission: 55 min  [facility] Pager: [phone] 10/26/2014, 6:12 AM  If 7PM-7AM, please contact night-coverage  [email or web address]  Password [email or web address]

## 2014-10-26 NOTE — ED Notes (Signed)
Pt. Refusing pain medication at this time. Pt. Requesting medication for nausea. EDP made aware.

## 2014-10-27 DIAGNOSIS — K3184 Gastroparesis: Principal | ICD-10-CM

## 2014-10-27 DIAGNOSIS — K5900 Constipation, unspecified: Secondary | ICD-10-CM

## 2014-10-27 LAB — CBC
HCT: 41.1 % (ref 36.0–46.0)
HEMOGLOBIN: 13.6 g/dL (ref 12.0–15.0)
MCH: 28.8 pg (ref 26.0–34.0)
MCHC: 33.1 g/dL (ref 30.0–36.0)
MCV: 87.1 fL (ref 78.0–100.0)
Platelets: 279 10*3/uL (ref 150–400)
RBC: 4.72 MIL/uL (ref 3.87–5.11)
RDW: 12.6 % (ref 11.5–15.5)
WBC: 7.4 10*3/uL (ref 4.0–10.5)

## 2014-10-27 LAB — BASIC METABOLIC PANEL
Anion gap: 3 — ABNORMAL LOW (ref 5–15)
BUN: 8 mg/dL (ref 6–20)
CHLORIDE: 109 mmol/L (ref 101–111)
CO2: 27 mmol/L (ref 22–32)
CREATININE: 0.7 mg/dL (ref 0.44–1.00)
Calcium: 8.2 mg/dL — ABNORMAL LOW (ref 8.9–10.3)
GFR calc non Af Amer: >60 mL/min (ref >60)
GLUCOSE: 92 mg/dL (ref 70–99)
POTASSIUM: 4.2 mmol/L (ref 3.5–5.1)
SODIUM: 139 mmol/L (ref 135–145)

## 2014-10-27 MED ORDER — OMEPRAZOLE 20 MG PO CPDR: 20.0000 mg | DELAYED_RELEASE_CAPSULE | Freq: Every day | ORAL | Status: DC

## 2014-10-27 MED ORDER — METOCLOPRAMIDE HCL 10 MG PO TABS
10.0000 mg | ORAL_TABLET | Freq: Three times a day (TID) | ORAL | Status: DC
  Administered 2014-10-27: 10 mg via ORAL
  Filled 2014-10-27: qty 1

## 2014-10-27 MED ORDER — IBUPROFEN 800 MG PO TABS: 800.0000 mg | ORAL_TABLET | Freq: Three times a day (TID) | ORAL | Status: DC

## 2014-10-27 MED ORDER — METOCLOPRAMIDE HCL 10 MG PO TABS: 10.0000 mg | ORAL_TABLET | Freq: Three times a day (TID) | ORAL | Status: DC

## 2014-10-27 MED ORDER — ONDANSETRON HCL 4 MG PO TABS: 4.0000 mg | ORAL_TABLET | Freq: Three times a day (TID) | ORAL | Status: DC | PRN

## 2014-10-27 NOTE — Discharge Planning (Signed)
Client discharge papers given and explained.  Told of scripts sent to pharm. VSS. RN assessment shows stability. Client tolerated food and was given suggested gastroporesis diet. Client being taken home by family.

## 2014-10-27 NOTE — Progress Notes (Signed)
  Subjective:  Patient has no complaints. She denies nausea or abdominal pain. She is hungry. She has not experienced any side effects with metoclopramide.  Objective: Blood pressure 115/57, pulse 50, temperature 98.7 F (37.1 C), temperature source Oral, resp. rate 20, height 5' (1.524 m), weight 217 lb (98.431 kg), last menstrual period 10/26/2014, SpO2 98 %. Patient is alert and in no acute distress. Abdomen is obese but soft and nontender without organomegaly or masses.   Labs/studies Results:   Recent Labs  10/26/14 0412 10/27/14 0644  WBC 11.2* 7.4  HGB 15.5* 13.6  HCT 45.4 41.1  PLT 355 279    BMET   Recent Labs  10/26/14 0412 10/27/14 0644  NA 138 139  K 4.1 4.2  CL 101 109  CO2 30 27  GLUCOSE 108* 92  BUN 10 8  CREATININE 0.84 0.70  CALCIUM 9.3 8.2*     TSH is 3.687  Assessment:  #1. 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 Significant symptomatic improvement with IV metoclopramide. TSH is normal. She does not appear to be diabetic. Diagnosis will be confirmed with solid-phase gastric emptying study off metoclopramide. #2. Obesity. Patient needs to exercise regularly and decrease calorie intake.    Recommendations;   Advance diet to low-fat diet. Will provide gastroparesis diet instructions. Unless she develops vomiting postprandially she should be able to go home today on metoclopramide 10 mg before each meal. Continue pantoprazole at 40 mg by mouth every morning Patient advised to call office if she has any side effects with metoclopramide. Patient has office visit with me in 2 weeks.

## 2014-10-27 NOTE — Discharge Summary (Addendum)
Physician Discharge Summary  Mckenzie Cooley INO:[id number] DOB: 1991/04/28 DOA: 10/26/2014  PCP: Minerva Ends, MD  Admit date: 10/26/2014 Discharge date: 10/27/2014  Recommendations for Outpatient Follow-up:  1. Follow up with gastroenterology in 2 weeks for gastric emptying study 2. Started on reglan but advised to hold 24 hours prior to her procedure  Discharge Diagnoses:  Principal Problem:   Gastroparesis Active Problems:   GERD (gastroesophageal reflux disease)   Epigastric pain   Abdominal pain   Constipation   Discharge Condition: Stable, improved  Diet recommendation: Low fat, small frequent meals  Wt Readings from Last 3 Encounters:  10/26/14 98.431 kg (217 lb)  09/27/14 99.791 kg (220 lb)  08/20/14 93.895 kg (207 lb)    History of present illness:  The patient is a 24 year old female with history of acid reflux, asthma, polycystic ovarian syndrome who presented to the emergency department with abdominal pain which had been occurring episodically over the last 5 months prior to admission. She is status post cholecystectomy and stated that her pain was different than her previous gallbladder pain. She had had problems with severe constipation and heartburn over the same timeframe. She had been taking laxatives and stool softeners without considerable relief. She was scheduled to see a gastroenterologist, but due to the severity of her symptoms she presented to the emergency department.  She had taken some laxatives and stool softeners at home and her constipation was already improving at the time of admission. A gonorrhea and chlamydia test was sent to evaluate for PID which is pending at the time of discharge. Her TSH was within normal limits. She was seen by gastroenterology and underwent EGD which demonstrated a substantial amount of solid food retained in the stomach despite her being on a clear liquid diet and then nothing by mouth for more than 12 hours prior to  procedure. This was suggestive of gastroparesis. She had no evidence of esophagitis or gastritis on exam. She was started on Reglan and felt much better. She was advised to follow-up with gastroenterology in 2 weeks for a gastric emptying study. She should hold her Reglan 24 hours prior to her procedure.  Hospital Course:   Gastroparesis and constipation, likely both contributing to her abdominal pain. Her pregnancy test was negative. Liver function tests, lipase, and urinalysis were normal. She was advised to use MiraLAX for constipation. She was seen by gastroenterology and underwent upper endoscopy which demonstrated retained food concerning for gastroparesis. Her TSH was normal and the etiology of her gastroparesis is unclear. She does not have diabetes. If she is confirmed to have gastroparesis she may need further neurologic testing to evaluate for other causes of neuropathy and screening for bulimia. GC/chlamydia test is pending at the time of discharge and she will be notified if this is abnormal. Given the findings on upper endoscopy is felt that PID is less likely.  Possible costochondritis, with severe pain on palpation of the xiphoid process in the right sternocostal joints. Recommend that she use ibuprofen for pain and inflammation.  Heartburn/GERD, started on omeprazole at discharge.  Consultants:  GI, Dr. Joya Gaskins  Procedures:  EGD on 5/6  Antibiotics:  none  Discharge Exam: Filed Vitals:   10/27/14 0703  BP: 115/57  Pulse: 50  Temp: 98.7 F (37.1 C)  Resp: 20   Filed Vitals:   10/26/14 1615 10/26/14 1630 10/26/14 2222 10/27/14 0703  BP: 116/60 126/64 88/51 115/57  Pulse: 61 57 53 50  Temp:  98.3 F (36.8 C)  98.6 F (37 C) 98.7 F (37.1 C)  TempSrc:  Oral Oral Oral  Resp: 23 22 20 20   Height:      Weight:      SpO2: 99% 97% 99% 98%     General: Obese female, No acute distress  HEENT: NCAT, MMM  Cardiovascular: RRR, nl S1, S2 no mrg, 2+ pulses, warm  extremities  Respiratory: CTAB, no increased WOB  Abdomen: NABS, soft, nondistended, nontender  MSK: Normal tone and bulk, no LEE. TTP over xyphoid and right sternal border  Neuro: Grossly intact  Discharge Instructions      Discharge Instructions    Call MD for:  difficulty breathing, headache or visual disturbances    Complete by:  As directed      Call MD for:  extreme fatigue    Complete by:  As directed      Call MD for:  hives    Complete by:  As directed      Call MD for:  persistant dizziness or light-headedness    Complete by:  As directed      Call MD for:  persistant nausea and vomiting    Complete by:  As directed      Call MD for:  severe uncontrolled pain    Complete by:  As directed      Call MD for:  temperature >100.4    Complete by:  As directed      Diet - low sodium heart healthy    Complete by:  As directed      Discharge instructions    Complete by:  As directed   You were found to have constipation and gastroparesis.  For your delayed stomach emptying, please take reglan four times a day before meals and bedtime.  This will help your stomach empty faster.  Please read the side effects of this medication carefully.  Please also take omeprazole once every day for acid reflux and stop your ranitidine.  Omeprazole is more powerful.  For your chest pain, take ibuprofen 800mg  three times a day for the next 7 days.  Use miralax and bisacodyl as needed for constipation, but reglan can sometimes cause diarrhea too so try out the reglan for a day or two and then start miralax if you are not having bowel movements.     Increase activity slowly    Complete by:  As directed             Medication List    STOP taking these medications        albuterol 108 (90 BASE) MCG/ACT inhaler  Commonly known as:  PROVENTIL HFA;VENTOLIN HFA     ranitidine 150 MG tablet  Commonly known as:  ZANTAC      TAKE these medications        bisacodyl 5 MG EC tablet   Commonly known as:  DULCOLAX  Take 1 tablet (5 mg total) by mouth daily as needed for moderate constipation.     ibuprofen 800 MG tablet  Commonly known as:  ADVIL,MOTRIN  Take 1 tablet (800 mg total) by mouth 3 (three) times daily.     metoCLOPramide 10 MG tablet  Commonly known as:  REGLAN  Take 1 tablet (10 mg total) by mouth 4 (four) times daily -  before meals and at bedtime.     omeprazole 20 MG capsule  Commonly known as:  PRILOSEC  Take 1 capsule (20 mg total) by mouth daily.  ondansetron 4 MG tablet  Commonly known as:  ZOFRAN  Take 1 tablet (4 mg total) by mouth every 8 (eight) hours as needed for nausea or vomiting.     polyethylene glycol powder powder  Commonly known as:  MIRALAX  Take 17 g by mouth daily.       Follow-up Information    Follow up with Minerva Ends, MD. Schedule an appointment as soon as possible for a visit in 1 week.   Specialty:  Family Medicine   Contact information:   [place] [address] [phone]       Follow up with REHMAN,NAJEEB U, MD. Schedule an appointment as soon as possible for a visit in 2 weeks.   Specialty:  Gastroenterology   Contact information:   [place], [address] [place] [address] [phone]        The results of significant diagnostics from this hospitalization (including imaging, microbiology, ancillary and laboratory) are listed below for reference.    Significant Diagnostic Studies: No results found.  Microbiology: No results found for this or any previous visit (from the past 240 hour(s)).   Labs: Basic Metabolic Panel:  Recent Labs Lab 10/26/14 0412 10/27/14 0644  NA 138 139  K 4.1 4.2  CL 101 109  CO2 30 27  GLUCOSE 108* 92  BUN 10 8  CREATININE 0.84 0.70  CALCIUM 9.3 8.2*   Liver Function Tests:  Recent Labs Lab 10/26/14 0412  AST 19  ALT 24  ALKPHOS 74  BILITOT 0.4  PROT 8.0  ALBUMIN 4.2    Recent Labs Lab 10/26/14 0412  LIPASE 23    No results for input(s): AMMONIA in the last 168 hours. CBC:  Recent Labs Lab 10/26/14 0412 10/27/14 0644  WBC 11.2* 7.4  NEUTROABS 6.7  --   HGB 15.5* 13.6  HCT 45.4 41.1  MCV 85.7 87.1  PLT 355 279   Cardiac Enzymes: No results for input(s): CKTOTAL, CKMB, CKMBINDEX, TROPONINI in the last 168 hours. BNP: BNP (last 3 results) No results for input(s): BNP in the last 8760 hours.  ProBNP (last 3 results) No results for input(s): PROBNP in the last 8760 hours.  CBG: No results for input(s): GLUCAP in the last 168 hours.  Time coordinating discharge: 35 minutes  Signed:  Rashee Marschall  [facility] 10/27/2014, 10:06 AM

## 2014-10-29 ENCOUNTER — Encounter (HOSPITAL_COMMUNITY): Payer: Self-pay | Admitting: Internal Medicine

## 2014-11-15 ENCOUNTER — Ambulatory Visit (INDEPENDENT_AMBULATORY_CARE_PROVIDER_SITE_OTHER): Payer: Self-pay | Admitting: Internal Medicine

## 2014-11-15 ENCOUNTER — Encounter (INDEPENDENT_AMBULATORY_CARE_PROVIDER_SITE_OTHER): Payer: Self-pay | Admitting: *Deleted

## 2014-11-15 ENCOUNTER — Encounter (INDEPENDENT_AMBULATORY_CARE_PROVIDER_SITE_OTHER): Payer: Self-pay | Admitting: Internal Medicine

## 2014-11-15 VITALS — BP 98/50 | HR 60 | Temp 97.0°F | Ht 60.0 in | Wt 218.7 lb

## 2014-11-15 DIAGNOSIS — K3184 Gastroparesis: Secondary | ICD-10-CM

## 2014-11-15 DIAGNOSIS — R1013 Epigastric pain: Secondary | ICD-10-CM

## 2014-11-15 DIAGNOSIS — G8929 Other chronic pain: Secondary | ICD-10-CM

## 2014-11-15 DIAGNOSIS — R110 Nausea: Secondary | ICD-10-CM

## 2014-11-15 MED ORDER — PROMETHAZINE HCL 12.5 MG PO TABS: 12.5000 mg | ORAL_TABLET | Freq: Four times a day (QID) | ORAL | Status: DC | PRN

## 2014-11-15 NOTE — Progress Notes (Addendum)
Subjective:    Patient ID: Mckenzie Cooley, female    DOB: November 28, 1990, 24 y.o.   MRN: [id number]  HPI Here today for f/u after recent admission to [facility] for epigastric pain. Underwent an EGD while an inpatient.  There was a large amt of food in the stomach which was suggestive of gastroparesis. She was started on Reglan by Dr. Laural Golden. She tells me the Reglan helped at first but really is not working now.  Her appetite is okay. She wants to eat everything she sees. She has not lost any weight.  She continues to have nausea. She says the Zofran is not working. She continues to have epigastric pain. She usually has a BM about every 3-4 days. Uses Miralax and a laxative prn.  She is not exercising.  No side effect from the Reglan.  10/26/2014 EGD: Indications: Patient is 24 year old Caucasian female who presents with three-month history of epigastric pain nausea early satiety and 10 pound weight loss. She takes Advil 6 and milligrams 3 times a week for headache. Impression: Large amount of food debris with patent pylorus suggestive of gastroparesis. No evidence of erosive esophagitis or peptic ulcer disease  EGD in 2011 was normal.    10/27/2014 TSH normal. CBC    Component Value Date/Time   WBC 7.4 10/27/2014 0644   RBC 4.72 10/27/2014 0644   HGB 13.6 10/27/2014 0644   HCT 41.1 10/27/2014 0644   PLT 279 10/27/2014 0644   MCV 87.1 10/27/2014 0644   MCH 28.8 10/27/2014 0644   MCHC 33.1 10/27/2014 0644   RDW 12.6 10/27/2014 0644   LYMPHSABS 3.3 10/26/2014 0412   MONOABS 1.0 10/26/2014 0412   EOSABS 0.2 10/26/2014 0412   BASOSABS 0.0 10/26/2014 0412   CMP Latest Ref Rng 10/27/2014 10/26/2014 08/20/2014  Glucose 70 - 99 mg/dL 92 108(H) 97  BUN 6 - 20 mg/dL 8 10 8   Creatinine 0.44 - 1.00 mg/dL 0.70 0.84 0.76  Sodium 135 - 145 mmol/L 139 138 138  Potassium 3.5 - 5.1 mmol/L 4.2 4.1 4.2  Chloride 101 - 111 mmol/L 109 101  108  CO2 22 - 32 mmol/L 27 30 28   Calcium 8.9 - 10.3 mg/dL 8.2(L) 9.3 8.9  Total Protein 6.5 - 8.1 g/dL - 8.0 -  Total Bilirubin 0.3 - 1.2 mg/dL - 0.4 -  Alkaline Phos 38 - 126 U/L - 74 -  AST 15 - 41 U/L - 19 -  ALT 14 - 54 U/L - 24 -        Review of Systems Past Medical History  Diagnosis Date  . Acid reflux Dx 2011  . Bloating 2011  . Weight loss, abnormal   . Asthma     As a Child  . PCOS (polycystic ovarian syndrome) Dx 2011    Past Surgical History  Procedure Laterality Date  . Upper gastrointestinal endoscopy  12/05/2009  . Upper gastrointestinal endoscopy  02/07/2009  . Cholecystectomy    . Wisdom tooth extraction    . Esophagogastroduodenoscopy N/A 10/26/2014    Procedure: ESOPHAGOGASTRODUODENOSCOPY (EGD);  Surgeon: Rogene Houston, MD;  Location: [facility];  Service: Endoscopy;  Laterality: N/A;    Allergies  Allergen Reactions  . Percocet [Oxycodone-Acetaminophen] Hives and Itching  . Amoxicillin Rash    Current Outpatient Prescriptions on File Prior to Visit  Medication Sig Dispense Refill  . bisacodyl (DULCOLAX) 5 MG EC tablet Take 1 tablet (5 mg total) by mouth daily as needed for moderate constipation. [place]  tablet 0  . ibuprofen (ADVIL,MOTRIN) 800 MG tablet Take 1 tablet (800 mg total) by mouth 3 (three) times daily. (Patient taking differently: Take 800 mg by mouth as needed. ) 21 tablet 0  . metoCLOPramide (REGLAN) 10 MG tablet Take 1 tablet (10 mg total) by mouth 4 (four) times daily -  before meals and at bedtime. 60 tablet 0  . ondansetron (ZOFRAN) 4 MG tablet Take 1 tablet (4 mg total) by mouth every 8 (eight) hours as needed for nausea or vomiting. 20 tablet 0  . polyethylene glycol powder (MIRALAX) powder Take 17 g by mouth daily. 850 g 0  . [DISCONTINUED] simethicone (MYLICON) 80 MG chewable tablet Chew 160 mg by mouth daily as needed for flatulence.     No current facility-administered medications on file prior to visit.          Objective:   Physical Exam Blood pressure 98/50, pulse 60, temperature 97 F (36.1 C), height 5' (1.524 m), weight 218 lb 11.2 oz (99.202 kg), last menstrual period 10/26/2014.  Alert and oriented. Skin warm and dry. Oral mucosa is moist.   . Sclera anicteric, conjunctivae is pink. Thyroid not enlarged. No cervical lymphadenopathy. Lungs clear. Heart regular rate and rhythm.  Abdomen is soft. Bowel sounds are positive. No hepatomegaly. No abdominal masses felt. No tenderness.  No edema to lower extremities.        Assessment & Plan:  Nausea, epigastric pain. Gastroparesis needs to be ruled out. NM gastric emptying study.

## 2014-11-15 NOTE — Patient Instructions (Signed)
Gastric emptying study. After study resume Reglan

## 2014-11-22 ENCOUNTER — Ambulatory Visit (HOSPITAL_COMMUNITY)
Admission: RE | Admit: 2014-11-22 | Discharge: 2014-11-22 | Disposition: A | Discharge status: Home / Self Care | Payer: MEDICAID | Source: Ambulatory Visit | Attending: Internal Medicine | Admitting: Internal Medicine

## 2014-11-22 DIAGNOSIS — K219 Gastro-esophageal reflux disease without esophagitis: Secondary | ICD-10-CM | POA: Insufficient documentation

## 2014-11-22 DIAGNOSIS — K3184 Gastroparesis: Secondary | ICD-10-CM

## 2014-11-22 DIAGNOSIS — R110 Nausea: Secondary | ICD-10-CM | POA: Insufficient documentation

## 2014-11-22 DIAGNOSIS — R140 Abdominal distension (gaseous): Secondary | ICD-10-CM | POA: Insufficient documentation

## 2014-11-22 MED ORDER — TECHNETIUM TC 99M SULFUR COLLOID
2.0000 | Freq: Once | INTRAVENOUS | Status: AC | PRN
  Administered 2014-11-22: 2 via ORAL

## 2014-11-27 ENCOUNTER — Other Ambulatory Visit (INDEPENDENT_AMBULATORY_CARE_PROVIDER_SITE_OTHER): Payer: Self-pay | Admitting: Internal Medicine

## 2014-11-27 ENCOUNTER — Telehealth (INDEPENDENT_AMBULATORY_CARE_PROVIDER_SITE_OTHER): Payer: Self-pay | Admitting: *Deleted

## 2014-11-27 MED ORDER — ONDANSETRON HCL 4 MG PO TABS: 4.0000 mg | ORAL_TABLET | Freq: Three times a day (TID) | ORAL | Status: DC | PRN

## 2014-11-27 NOTE — Telephone Encounter (Signed)
Patient returned your call in regards to results of GES, please call @ [phone]

## 2014-11-27 NOTE — Telephone Encounter (Signed)
Refill on Zofran 

## 2014-11-28 NOTE — Telephone Encounter (Signed)
I have talked with patient  

## 2015-01-01 ENCOUNTER — Encounter (INDEPENDENT_AMBULATORY_CARE_PROVIDER_SITE_OTHER): Payer: Self-pay | Admitting: Internal Medicine

## 2015-01-01 ENCOUNTER — Ambulatory Visit (INDEPENDENT_AMBULATORY_CARE_PROVIDER_SITE_OTHER): Payer: Self-pay | Admitting: Internal Medicine

## 2015-01-01 VITALS — BP 100/50 | HR 60 | Temp 97.6°F | Ht 60.0 in | Wt 218.0 lb

## 2015-01-01 DIAGNOSIS — R110 Nausea: Secondary | ICD-10-CM

## 2015-01-01 NOTE — Progress Notes (Signed)
Subjective:    Patient ID: Mckenzie Cooley, female    DOB: 03-Apr-1991, 24 y.o.   MRN: [id number]  HPI Here today for f/u of her epigastric pain, nausea. Last seen in May. She underwent a Gastric emptying study in June which was normal. She says she is doing better. Her last weight in May was 218. She is trying to lose weight. She stays busy at work.  Appetite is good. Does have some nausea after eating and takes Zofran. She only takes the Zofran at night.  She is taking the Reglan before meals. She feels 100% better. She does occasionally has bloating. She feels better since staying active at work. She has less symptoms when she is busy.  Zantac controls her acid reflux.     10/26/2014 EGD: Indications: Patient is 24 year old Caucasian female who presents with three-month history of epigastric pain nausea early satiety and 10 pound weight loss. She takes Advil 6 and milligrams 3 times a week for headache. Impression: Large amount of food debris with patent pylorus suggestive of gastroparesis. No evidence of erosive esophagitis or peptic ulcer disease  EGD in 2011 was normal.    Review of Systems Past Medical History  Diagnosis Date  . Acid reflux Dx 2011  . Bloating 2011  . Weight loss, abnormal   . Asthma     As a Child  . PCOS (polycystic ovarian syndrome) Dx 2011    Past Surgical History  Procedure Laterality Date  . Upper gastrointestinal endoscopy  12/05/2009  . Upper gastrointestinal endoscopy  02/07/2009  . Wisdom tooth extraction    . Esophagogastroduodenoscopy N/A 10/26/2014    Procedure: ESOPHAGOGASTRODUODENOSCOPY (EGD);  Surgeon: Rogene Houston, MD;  Location: [facility];  Service: Endoscopy;  Laterality: N/A;  . Cholecystectomy  2011    Dr.Byerly    Allergies  Allergen Reactions  . Percocet [Oxycodone-Acetaminophen] Hives and Itching  . Amoxicillin Rash    Current Outpatient  Prescriptions on File Prior to Visit  Medication Sig Dispense Refill  . bisacodyl (DULCOLAX) 5 MG EC tablet Take 1 tablet (5 mg total) by mouth daily as needed for moderate constipation. 30 tablet 0  . ibuprofen (ADVIL,MOTRIN) 800 MG tablet Take 1 tablet (800 mg total) by mouth 3 (three) times daily. (Patient taking differently: Take 800 mg by mouth as needed. ) 21 tablet 0  . metoCLOPramide (REGLAN) 10 MG tablet Take 1 tablet (10 mg total) by mouth 4 (four) times daily -  before meals and at bedtime. 60 tablet 0  . ondansetron (ZOFRAN) 4 MG tablet Take 1 tablet (4 mg total) by mouth every 8 (eight) hours as needed for nausea or vomiting. 30 tablet 1  . polyethylene glycol powder (MIRALAX) powder Take 17 g by mouth daily. 850 g 0  . ranitidine (ZANTAC) 150 MG tablet Take 150 mg by mouth daily.     . [DISCONTINUED] simethicone (MYLICON) 80 MG chewable tablet Chew 160 mg by mouth daily as needed for flatulence.     No current facility-administered medications on file prior to visit.        Objective:   Physical Exam Blood pressure 100/50, pulse 60, temperature 97.6 F (36.4 C), height 5' (1.524 m), weight 218 lb (98.884 kg). Alert and oriented. Skin warm and dry. Oral mucosa is moist.   . Sclera anicteric, conjunctivae is pink. Thyroid not enlarged. No cervical lymphadenopathy. Lungs clear. Heart regular rate and rhythm.  Abdomen is soft. Bowel sounds are positive. No hepatomegaly. No  abdominal masses felt. No tenderness.  No edema to lower extremities. Patient is alert and oriented.       Assessment & Plan:  Epigastric pain/nausea. She feels 100% better. No side effects from the Reglan.  Continue Reglan and Zantac. OV in 1 year. If any problems call Dr. Laural Golden.

## 2015-02-15 ENCOUNTER — Encounter: Payer: Self-pay | Admitting: Family Medicine

## 2015-02-15 ENCOUNTER — Ambulatory Visit: Payer: Self-pay | Attending: Family Medicine | Admitting: Family Medicine

## 2015-02-15 VITALS — BP 113/78 | HR 57 | Temp 98.4°F | Resp 16 | Ht 60.0 in | Wt 213.0 lb

## 2015-02-15 DIAGNOSIS — K3184 Gastroparesis: Secondary | ICD-10-CM | POA: Insufficient documentation

## 2015-02-15 DIAGNOSIS — E282 Polycystic ovarian syndrome: Secondary | ICD-10-CM | POA: Insufficient documentation

## 2015-02-15 LAB — POCT GLYCOSYLATED HEMOGLOBIN (HGB A1C): Hemoglobin A1C: 5.2

## 2015-02-15 LAB — GLUCOSE, POCT (MANUAL RESULT ENTRY): POC Glucose: 80 mg/dl (ref 70–99)

## 2015-02-15 NOTE — Progress Notes (Signed)
Pt requesting GYN referral  Last pap smear 3-4 years ago, results normal   problem with PCOS

## 2015-02-15 NOTE — Assessment & Plan Note (Signed)
Normal gastric emptying study

## 2015-02-15 NOTE — Assessment & Plan Note (Signed)
Thank you for coming in to see me today to discuss your PCOS  Here is the plan: Repeat ultrasound, ordered Check blood sugar/A1c to assess for insulin resistance. Done today  Return in 2-4 weeks for pap smear Gyn referral after pap smear

## 2015-02-15 NOTE — Patient Instructions (Addendum)
Mckenzie Cooley,  Thank you for coming in to see me today to discuss your PCOS  Here is the plan: Repeat ultrasound, ordered Check blood sugar/A1c to assess for insulin resistance. Done today and normal.   Return in 2-4 weeks for pap smear Gyn referral after pap smear   Focus on low carb eating. Check out ketogenic diet. You have lost weight, keep up the good work.   Dr. Adrian Blackwater   Polycystic Ovarian Syndrome Polycystic ovarian syndrome (PCOS) is a common hormonal disorder among women of reproductive age. Most women with PCOS grow many small cysts on their ovaries. PCOS can cause problems with your periods and make it difficult to get pregnant. It can also cause an increased risk of miscarriage with pregnancy. If left untreated, PCOS can lead to serious health problems, such as diabetes and heart disease. CAUSES The cause of PCOS is not fully understood, but genetics may be a factor. SIGNS AND SYMPTOMS   Infrequent or no menstrual periods.   Inability to get pregnant (infertility) because of not ovulating.   Increased growth of hair on the face, chest, stomach, back, thumbs, thighs, or toes.   Acne, oily skin, or dandruff.   Pelvic pain.   Weight gain or obesity, usually carrying extra weight around the waist.   Type 2 diabetes.   High cholesterol.   High blood pressure.   Female-pattern baldness or thinning hair.   Patches of thickened and dark brown or black skin on the neck, arms, breasts, or thighs.   Tiny excess flaps of skin (skin tags) in the armpits or neck area.   Excessive snoring and having breathing stop at times while asleep (sleep apnea).   Deepening of the voice.   Gestational diabetes when pregnant.  DIAGNOSIS  There is no single test to diagnose PCOS.   Your health care provider will:   Take a medical history.   Perform a pelvic exam.   Have ultrasonography done.   Check your female and female hormone levels.   Measure  glucose or sugar levels in the blood.   Do other blood tests.   If you are producing too many female hormones, your health care provider will make sure it is from PCOS. At the physical exam, your health care provider will want to evaluate the areas of increased hair growth. Try to allow natural hair growth for a few days before the visit.   During a pelvic exam, the ovaries may be enlarged or swollen because of the increased number of small cysts. This can be seen more easily by using vaginal ultrasonography or screening to examine the ovaries and lining of the uterus (endometrium) for cysts. The uterine lining may become thicker if you have not been having a regular period.  TREATMENT  Because there is no cure for PCOS, it needs to be managed to prevent problems. Treatments are based on your symptoms. Treatment is also based on whether you want to have a baby or whether you need contraception.  Treatment may include:   Progesterone hormone to start a menstrual period.   Birth control pills to make you have regular menstrual periods.   Medicines to make you ovulate, if you want to get pregnant.   Medicines to control your insulin.   Medicine to control your blood pressure.   Medicine and diet to control your high cholesterol and triglycerides in your blood.  Medicine to reduce excessive hair growth.  Surgery, making small holes in the ovary, to decrease  the amount of female hormone production. This is done through a long, lighted tube (laparoscope) placed into the pelvis through a tiny incision in the lower abdomen.  HOME CARE INSTRUCTIONS  Only take over-the-counter or prescription medicine as directed by your health care provider.  Pay attention to the foods you eat and your activity levels. This can help reduce the effects of PCOS.  Keep your weight under control.  Eat foods that are low in carbohydrate and high in fiber.  Exercise regularly. SEEK MEDICAL CARE  IF:  Your symptoms do not get better with medicine.  You have new symptoms. Document Released: 10/02/2004 Document Revised: 03/29/2013 Document Reviewed: 11/24/2012 [facility] Patient Information 2015 [place], [place]. This information is not intended to replace advice given to you by your health care provider. Make sure you discuss any questions you have with your health care provider.

## 2015-02-15 NOTE — Progress Notes (Signed)
   Subjective:    Patient ID: Mckenzie Cooley, female    DOB: 1990-09-15, 24 y.o.   MRN: [id number] CC: OB/Gyn referral  HPI 24 yo F with PCOS presents for f/u visit  1. PCOS: Menarche at age 33. Iregular menses all of her life. Bleeding can last up to 90 days. Currently bleeding x 1 day. Her menstrual bleeding is very irregular with pelvic cramping. She would like to conceive. 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 The patient and her husband would like to conceive.   Past Medical History  Diagnosis Date  . Acid reflux Dx 2011  . Bloating 2011  . Weight loss, abnormal   . Asthma     As a Child  . PCOS (polycystic ovarian syndrome) Dx 2011   Social History  Substance Use Topics  . Smoking status: Current Some Day Smoker -- 1.00 packs/day    Types: Cigarettes  . Smokeless tobacco: Not on file     Comment: 1 pack a day  . Alcohol Use: No   Review of Systems  Constitutional: Negative for fever and chills.  Gastrointestinal: Positive for abdominal pain. Negative for blood in stool.  Genitourinary: Positive for vaginal bleeding, menstrual problem and pelvic pain. Negative for vaginal discharge and vaginal pain.  Musculoskeletal: Negative for back pain and arthralgias.  Skin: Negative for rash.  Allergic/Immunologic: Negative for immunocompromised state.  Psychiatric/Behavioral: Negative for suicidal ideas and dysphoric mood.       Objective:   Physical Exam  Constitutional: She is oriented to person, place, and time. She appears well-developed and well-nourished. No distress.  Obese   Pulmonary/Chest: Effort normal.  Genitourinary: Pelvic exam was performed with patient prone. Cervix exhibits no motion tenderness, no discharge and no friability.  Neurological: She is alert and oriented to person, place, and time.  Skin: Skin is dry.  Psychiatric: She has a normal mood and affect.  BP 113/78 mmHg  Pulse 57  Temp(Src) 98.4 F (36.9 C) (Oral)  Resp 16   Ht 5' (1.524 m)  Wt 213 lb (96.616 kg)  BMI 41.60 kg/m2  SpO2 97%  LMP 02/14/2015  Wt Readings from Last 3 Encounters:  02/15/15 213 lb (96.616 kg)  01/01/15 218 lb (98.884 kg)  11/15/14 218 lb 11.2 oz (99.202 kg)    No results found for: HGBA1C CBG 80       Assessment & Plan:

## 2015-03-01 ENCOUNTER — Ambulatory Visit (HOSPITAL_COMMUNITY): Admission: RE | Admit: 2015-03-01 | Payer: MEDICAID | Source: Ambulatory Visit

## 2015-03-01 ENCOUNTER — Other Ambulatory Visit (HOSPITAL_COMMUNITY): Payer: Self-pay

## 2015-04-29 ENCOUNTER — Emergency Department (HOSPITAL_COMMUNITY)
Admission: EM | Admit: 2015-04-29 | Discharge: 2015-04-29 | Disposition: A | Discharge status: Home / Self Care | Payer: Medicaid Other | Attending: Emergency Medicine | Admitting: Emergency Medicine

## 2015-04-29 ENCOUNTER — Encounter (HOSPITAL_COMMUNITY): Payer: Self-pay | Admitting: *Deleted

## 2015-04-29 DIAGNOSIS — Z8639 Personal history of other endocrine, nutritional and metabolic disease: Secondary | ICD-10-CM | POA: Insufficient documentation

## 2015-04-29 DIAGNOSIS — Z720 Tobacco use: Secondary | ICD-10-CM | POA: Insufficient documentation

## 2015-04-29 DIAGNOSIS — Z3202 Encounter for pregnancy test, result negative: Secondary | ICD-10-CM | POA: Insufficient documentation

## 2015-04-29 DIAGNOSIS — K219 Gastro-esophageal reflux disease without esophagitis: Secondary | ICD-10-CM | POA: Insufficient documentation

## 2015-04-29 DIAGNOSIS — N939 Abnormal uterine and vaginal bleeding, unspecified: Secondary | ICD-10-CM

## 2015-04-29 DIAGNOSIS — Z79899 Other long term (current) drug therapy: Secondary | ICD-10-CM | POA: Insufficient documentation

## 2015-04-29 DIAGNOSIS — N390 Urinary tract infection, site not specified: Secondary | ICD-10-CM | POA: Insufficient documentation

## 2015-04-29 DIAGNOSIS — Z880 Allergy status to penicillin: Secondary | ICD-10-CM | POA: Insufficient documentation

## 2015-04-29 DIAGNOSIS — J45909 Unspecified asthma, uncomplicated: Secondary | ICD-10-CM | POA: Insufficient documentation

## 2015-04-29 LAB — COMPREHENSIVE METABOLIC PANEL
ALT: 20 U/L (ref 14–54)
AST: 18 U/L (ref 15–41)
Albumin: 4.2 g/dL (ref 3.5–5.0)
Alkaline Phosphatase: 76 U/L (ref 38–126)
Anion gap: 10 (ref 5–15)
BILIRUBIN TOTAL: 0.7 mg/dL (ref 0.3–1.2)
BUN: 14 mg/dL (ref 6–20)
CO2: 26 mmol/L (ref 22–32)
Calcium: 9.3 mg/dL (ref 8.9–10.3)
Chloride: 103 mmol/L (ref 101–111)
Creatinine, Ser: 0.69 mg/dL (ref 0.44–1.00)
GFR calc Af Amer: >60 mL/min (ref >60)
Glucose, Bld: 93 mg/dL (ref 65–99)
POTASSIUM: 3.7 mmol/L (ref 3.5–5.1)
Sodium: 139 mmol/L (ref 135–145)
Total Protein: 8 g/dL (ref 6.5–8.1)

## 2015-04-29 LAB — URINALYSIS, ROUTINE W REFLEX MICROSCOPIC
Bilirubin Urine: NEGATIVE
GLUCOSE, UA: NEGATIVE mg/dL
NITRITE: POSITIVE — AB
PROTEIN: 100 mg/dL — AB
Specific Gravity, Urine: 1.015 (ref 1.005–1.030)
Urobilinogen, UA: 1 mg/dL (ref 0.0–1.0)
pH: 6.5 (ref 5.0–8.0)

## 2015-04-29 LAB — CBC WITH DIFFERENTIAL/PLATELET
BASOS ABS: 0 10*3/uL (ref 0.0–0.1)
Basophils Relative: 0 %
Eosinophils Absolute: 0.2 10*3/uL (ref 0.0–0.7)
Eosinophils Relative: 2 %
HEMATOCRIT: 44 % (ref 36.0–46.0)
Hemoglobin: 14.9 g/dL (ref 12.0–15.0)
LYMPHS PCT: 26 %
Lymphs Abs: 3.3 10*3/uL (ref 0.7–4.0)
MCH: 29 pg (ref 26.0–34.0)
MCHC: 33.9 g/dL (ref 30.0–36.0)
MCV: 85.8 fL (ref 78.0–100.0)
MONO ABS: 1 10*3/uL (ref 0.1–1.0)
MONOS PCT: 8 %
NEUTROS PCT: 64 %
Neutro Abs: 8.2 10*3/uL — ABNORMAL HIGH (ref 1.7–7.7)
Platelets: 343 10*3/uL (ref 150–400)
RBC: 5.13 MIL/uL — ABNORMAL HIGH (ref 3.87–5.11)
RDW: 12.8 % (ref 11.5–15.5)
WBC: 12.8 10*3/uL — ABNORMAL HIGH (ref 4.0–10.5)

## 2015-04-29 LAB — WET PREP, GENITAL
CLUE CELLS WET PREP: NONE SEEN
Trich, Wet Prep: NONE SEEN
Yeast Wet Prep HPF POC: NONE SEEN

## 2015-04-29 LAB — POC URINE PREG, ED: Preg Test, Ur: NEGATIVE

## 2015-04-29 LAB — URINE MICROSCOPIC-ADD ON

## 2015-04-29 MED ORDER — DEXTROSE 5 % IV SOLN
1.0000 g | Freq: Once | INTRAVENOUS | Status: AC
  Administered 2015-04-29: 1 g via INTRAVENOUS
  Filled 2015-04-29: qty 10

## 2015-04-29 MED ORDER — SODIUM CHLORIDE 0.9 % IV BOLUS (SEPSIS)
1000.0000 mL | Freq: Once | INTRAVENOUS | Status: AC
  Administered 2015-04-29: 1000 mL via INTRAVENOUS

## 2015-04-29 MED ORDER — TRAMADOL HCL 50 MG PO TABS: 50.0000 mg | ORAL_TABLET | Freq: Four times a day (QID) | ORAL | Status: DC | PRN

## 2015-04-29 MED ORDER — MORPHINE SULFATE (PF) 4 MG/ML IV SOLN
4.0000 mg | Freq: Once | INTRAVENOUS | Status: AC
  Administered 2015-04-29: 4 mg via INTRAVENOUS
  Filled 2015-04-29: qty 1

## 2015-04-29 MED ORDER — ONDANSETRON HCL 4 MG/2ML IJ SOLN
4.0000 mg | Freq: Once | INTRAMUSCULAR | Status: AC
  Administered 2015-04-29: 4 mg via INTRAVENOUS
  Filled 2015-04-29: qty 2

## 2015-04-29 MED ORDER — METOCLOPRAMIDE HCL 5 MG/ML IJ SOLN
10.0000 mg | Freq: Once | INTRAMUSCULAR | Status: AC
  Administered 2015-04-29: 10 mg via INTRAVENOUS
  Filled 2015-04-29: qty 2

## 2015-04-29 MED ORDER — CEPHALEXIN 500 MG PO CAPS: 500.0000 mg | ORAL_CAPSULE | Freq: Four times a day (QID) | ORAL | Status: DC

## 2015-04-29 MED ORDER — IBUPROFEN 800 MG PO TABS: 800.0000 mg | ORAL_TABLET | Freq: Three times a day (TID) | ORAL | Status: DC

## 2015-04-29 MED ORDER — HYDROMORPHONE HCL 1 MG/ML IJ SOLN
1.0000 mg | Freq: Once | INTRAMUSCULAR | Status: AC
  Administered 2015-04-29: 1 mg via INTRAVENOUS
  Filled 2015-04-29: qty 1

## 2015-04-29 MED ORDER — DIPHENHYDRAMINE HCL 50 MG/ML IJ SOLN
25.0000 mg | Freq: Once | INTRAMUSCULAR | Status: AC
  Administered 2015-04-29: 25 mg via INTRAVENOUS
  Filled 2015-04-29: qty 1

## 2015-04-29 NOTE — ED Notes (Signed)
Pt c/o lower abd pain with chills, lower back pain that started last night, pt also c/o heavy vaginal bleeding,

## 2015-04-29 NOTE — Discharge Instructions (Signed)
Abnormal Uterine Bleeding °Abnormal uterine bleeding can affect women at various stages in life, including teenagers, women in their reproductive years, pregnant women, and women who have reached menopause. Several kinds of uterine bleeding are considered abnormal, including: °· Bleeding or spotting between periods.   °· Bleeding after sexual intercourse.   °· Bleeding that is heavier or more than normal.   °· Periods that last longer than usual. °· Bleeding after menopause.   °Many cases of abnormal uterine bleeding are minor and simple to treat, while others are more serious. Any type of abnormal bleeding should be evaluated by your health care provider. Treatment will depend on the cause of the bleeding. °HOME CARE INSTRUCTIONS °Monitor your condition for any changes. The following actions may help to alleviate any discomfort you are experiencing: °· Avoid the use of tampons and douches as directed by your health care provider. °· Change your pads frequently. °You should get regular pelvic exams and Pap tests. Keep all follow-up appointments for diagnostic tests as directed by your health care provider.  °SEEK MEDICAL CARE IF:  °· Your bleeding lasts more than 1 week.   °· You feel dizzy at times.   °SEEK IMMEDIATE MEDICAL CARE IF:  °· You pass out.   °· You are changing pads every 15 to 30 minutes.   °· You have abdominal pain. °· You have a fever.   °· You become sweaty or weak.   °· You are passing large blood clots from the vagina.   °· You start to feel nauseous and vomit. °MAKE SURE YOU:  °· Understand these instructions. °· Will watch your condition. °· Will get help right away if you are not doing well or get worse. °  °This information is not intended to replace advice given to you by your health care provider. Make sure you discuss any questions you have with your health care provider. °  °Document Released: 06/08/2005 Document Revised: 06/13/2013 Document Reviewed: 01/05/2013 °Elsevier Interactive  Patient Education ©2016 [employer] ° °Urinary Tract Infection °Urinary tract infections (UTIs) can develop anywhere along your urinary tract. Your urinary tract is your body's drainage system for removing wastes and extra water. Your urinary tract includes two kidneys, two ureters, a bladder, and a urethra. Your kidneys are a pair of bean-shaped organs. Each kidney is about the size of your fist. They are located below your ribs, one on each side of your spine. °CAUSES °Infections are caused by microbes, which are microscopic organisms, including fungi, viruses, and bacteria. These organisms are so small that they can only be seen through a microscope. Bacteria are the microbes that most commonly cause UTIs. °SYMPTOMS  °Symptoms of UTIs may vary by age and gender of the patient and by the location of the infection. Symptoms in young women typically include a frequent and intense urge to urinate and a painful, burning feeling in the bladder or urethra during urination. Older women and men are more likely to be tired, shaky, and weak and have muscle aches and abdominal pain. A fever may mean the infection is in your kidneys. Other symptoms of a kidney infection include pain in your back or sides below the ribs, nausea, and vomiting. °DIAGNOSIS °To diagnose a UTI, your caregiver will ask you about your symptoms. Your caregiver will also ask you to provide a urine sample. The urine sample will be tested for bacteria and white blood cells. White blood cells are made by your body to help fight infection. °TREATMENT  °Typically, UTIs can be treated with medication. Because most UTIs   are caused by a bacterial infection, they usually can be treated with the use of antibiotics. The choice of antibiotic and length of treatment depend on your symptoms and the type of bacteria causing your infection. °HOME CARE INSTRUCTIONS °· If you were prescribed antibiotics, take them exactly as your caregiver instructs you. Finish the  medication even if you feel better after you have only taken some of the medication. °· Drink enough water and fluids to keep your urine clear or pale yellow. °· Avoid caffeine, tea, and carbonated beverages. They tend to irritate your bladder. °· Empty your bladder often. Avoid holding urine for long periods of time. °· Empty your bladder before and after sexual intercourse. °· After a bowel movement, women should cleanse from front to back. Use each tissue only once. °SEEK MEDICAL CARE IF:  °· You have back pain. °· You develop a fever. °· Your symptoms do not begin to resolve within 3 days. °SEEK IMMEDIATE MEDICAL CARE IF:  °· You have severe back pain or lower abdominal pain. °· You develop chills. °· You have nausea or vomiting. °· You have continued burning or discomfort with urination. °MAKE SURE YOU:  °· Understand these instructions. °· Will watch your condition. °· Will get help right away if you are not doing well or get worse. °  °This information is not intended to replace advice given to you by your health care provider. Make sure you discuss any questions you have with your health care provider. °  °Document Released: 03/18/2005 Document Revised: 02/27/2015 Document Reviewed: 07/17/2011 °Elsevier Interactive Patient Education ©2016 [employer] Inc. ° °

## 2015-04-29 NOTE — ED Provider Notes (Signed)
CSN: [id number]     Arrival date & time 04/29/15  0205 History   First MD Initiated Contact with Patient 04/29/15 0207     Chief Complaint  Patient presents with  . Pelvic Pain     (Consider location/radiation/quality/duration/timing/severity/associated sxs/prior Treatment) HPI Comments: Presents to the emergency department for evaluation of pelvic pain and vaginal bleeding. Patient reports that pain has been present for 1 day. She reports diffuse lower abdominal and pelvic pain and cramping that goes into her back. She denies urinary symptoms. Tonight she started having heavy vaginal bleeding. Patient reports that she is very irregular with her menstrual cycle because of polycystic ovarian syndrome. She reports that the pain that she is experiencing is worse than anything she has had with ovarian cysts in the past.  Patient is a 24 y.o. female presenting with pelvic pain.  Pelvic Pain    Past Medical History  Diagnosis Date  . Acid reflux Dx 2011  . Bloating 2011  . Weight loss, abnormal   . Asthma     As a Child  . PCOS (polycystic ovarian syndrome) Dx 2011   Past Surgical History  Procedure Laterality Date  . Upper gastrointestinal endoscopy  12/05/2009  . Upper gastrointestinal endoscopy  02/07/2009  . Wisdom tooth extraction    . Esophagogastroduodenoscopy N/A 10/26/2014    Procedure: ESOPHAGOGASTRODUODENOSCOPY (EGD);  Surgeon: Rogene Houston, MD;  Location: [facility];  Service: Endoscopy;  Laterality: N/A;  . Cholecystectomy  2011    Dr.Byerly   Family History  Problem Relation Age of Onset  . Renal Disease Other    Social History  Substance Use Topics  . Smoking status: Current Some Day Smoker -- 1.00 packs/day    Types: Cigarettes  . Smokeless tobacco: None     Comment: 1 pack a day  . Alcohol Use: No   OB History    No data available     Review of Systems  Genitourinary: Positive for vaginal bleeding and pelvic pain.  All other systems reviewed and  are negative.     Allergies  Percocet and Amoxicillin  Home Medications   Prior to Admission medications   Medication Sig Start Date End Date Taking? Authorizing Provider  ranitidine (ZANTAC) 150 MG tablet Take 150 mg by mouth daily.    Yes Historical Provider, MD  bisacodyl (DULCOLAX) 5 MG EC tablet Take 1 tablet (5 mg total) by mouth daily as needed for moderate constipation. 10/26/14   Janece Canterbury, MD  metoCLOPramide (REGLAN) 10 MG tablet Take 1 tablet (10 mg total) by mouth 4 (four) times daily -  before meals and at bedtime. 10/27/14   Janece Canterbury, MD  ondansetron (ZOFRAN) 4 MG tablet Take 1 tablet (4 mg total) by mouth every 8 (eight) hours as needed for nausea or vomiting. 11/27/14   Butch Penny, NP   LMP 04/29/2015 Physical Exam  Constitutional: She is oriented to person, place, and time. She appears well-developed and well-nourished. No distress.  HENT:  Head: Normocephalic and atraumatic.  Right Ear: Hearing normal.  Left Ear: Hearing normal.  Nose: Nose normal.  Mouth/Throat: Oropharynx is clear and moist and mucous membranes are normal.  Eyes: Conjunctivae and EOM are normal. Pupils are equal, round, and reactive to light.  Neck: Normal range of motion. Neck supple.  Cardiovascular: Regular rhythm, S1 normal and S2 normal.  Exam reveals no gallop and no friction rub.   No murmur heard. Pulmonary/Chest: Effort normal and breath sounds normal. No  respiratory distress. She exhibits no tenderness.  Abdominal: Soft. Normal appearance and bowel sounds are normal. There is no hepatosplenomegaly. There is no tenderness. There is no rebound, no guarding, no tenderness at McBurney's point and negative Murphy's sign. No hernia. Hernia confirmed negative in the right inguinal area and confirmed negative in the left inguinal area.  Genitourinary: Vagina normal. There is no rash, tenderness or lesion on the right labia. There is no rash, tenderness or lesion on the left labia.  Uterus is tender. Cervix exhibits no motion tenderness and no discharge. Right adnexum displays no mass and no tenderness. Left adnexum displays no mass and no tenderness.  Musculoskeletal: Normal range of motion.  Neurological: She is alert and oriented to person, place, and time. She has normal strength. No cranial nerve deficit or sensory deficit. Coordination normal. GCS eye subscore is 4. GCS verbal subscore is 5. GCS motor subscore is 6.  Skin: Skin is warm, dry and intact. No rash noted. No cyanosis.  Psychiatric: She has a normal mood and affect. Her speech is normal and behavior is normal. Thought content normal.  Nursing note and vitals reviewed.   ED Course  Procedures (including critical care time) Labs Review Labs Reviewed  WET PREP, GENITAL  CBC WITH DIFFERENTIAL/PLATELET  COMPREHENSIVE METABOLIC PANEL  URINALYSIS, ROUTINE W REFLEX MICROSCOPIC (NOT AT [facility])  RPR  HIV ANTIBODY (ROUTINE TESTING)  GC/CHLAMYDIA PROBE AMP ([place]) NOT AT [facility]    Imaging Review No results found. I have personally reviewed and evaluated these images and lab results as part of my medical decision-making.   EKG Interpretation None      MDM   Final diagnoses:  None   abnormal uterine bleeding  Presents to the emergency department for evaluation of pelvic pain and vaginal bleeding. Patient has history of polycystic ovarian syndrome. She reports that she has irregular menstrual bleeding. She has not had a period in months. She started having pain and cramping in the low pelvic region as well as back yesterday and then tonight started having heavy bleeding. Examination reveals uterine tenderness. No adnexal masses or tenderness. Patient's vital signs are stable. Hemoglobin is normal. She will require follow-up with OB/GYN, provided analgesia.    Orpah Greek, MD 04/29/15 0300

## 2015-04-29 NOTE — ED Notes (Signed)
Dr Pollina at bedside,  

## 2015-04-29 NOTE — ED Notes (Signed)
Pelvic exam completed by Dr Betsey Holiday, assisted by Luis Abed, pt tolerated well,

## 2015-04-29 NOTE — ED Notes (Signed)
Pt requesting additional nausea medication, sitting up on edge of bed, c/o epigastric pain that started when she layed back in bed,

## 2015-04-29 NOTE — ED Notes (Signed)
Pt came out to nursing desk, requesting RN to go back to room with her, pt lays on her stomach in bed, c/o epigastric pain, requesting additional pain medication, when asked about pain level, pt states "I just can't she is hurting" Dr Betsey Holiday notified,

## 2015-04-30 LAB — GC/CHLAMYDIA PROBE AMP (~~LOC~~) NOT AT ARMC
Chlamydia: NEGATIVE
Neisseria Gonorrhea: NEGATIVE

## 2015-05-01 LAB — HIV ANTIBODY (ROUTINE TESTING W REFLEX): HIV Screen 4th Generation wRfx: NONREACTIVE

## 2015-05-01 LAB — RPR: RPR Ser Ql: NONREACTIVE

## 2015-05-03 ENCOUNTER — Ambulatory Visit (INDEPENDENT_AMBULATORY_CARE_PROVIDER_SITE_OTHER): Payer: Self-pay | Admitting: Obstetrics & Gynecology

## 2015-05-03 ENCOUNTER — Encounter: Payer: Self-pay | Admitting: Obstetrics & Gynecology

## 2015-05-03 VITALS — BP 100/60 | HR 62 | Ht 60.0 in | Wt 216.0 lb

## 2015-05-03 DIAGNOSIS — N939 Abnormal uterine and vaginal bleeding, unspecified: Secondary | ICD-10-CM

## 2015-05-03 DIAGNOSIS — N946 Dysmenorrhea, unspecified: Secondary | ICD-10-CM

## 2015-05-03 MED ORDER — MEGESTROL ACETATE 40 MG PO TABS: ORAL_TABLET | ORAL | Status: DC

## 2015-05-03 NOTE — Progress Notes (Signed)
Patient ID: Mckenzie Cooley, female   DOB: 1991-01-15, 24 y.o.   MRN: [id number] Chief Complaint  Patient presents with  . Follow-up    vaginal bleeding/ uti.c/c still bleeding.    Blood pressure 100/60, pulse 62, height 5' (1.524 m), weight 216 lb (97.977 kg), last menstrual period 04/29/2015.  24 y.o. No obstetric history on file. Patient's last menstrual period was 04/29/2015. The current method of family planning is none.  Subjective Pt with history of irregular menses and PCOS Presented to ED with heavy prolonged vaginal bleeding and cramping Work up was negative  Objective   Pertinent ROS No burning with urination, frequency or urgency No nausea, vomiting or diarrhea Nor fever chills or other constitutional symptoms   Labs or studies All labs and previous scans from ED were reviewed    Impression Diagnoses this Encounter::   ICD-9-CM ICD-10-CM   1. Abnormal uterine bleeding (AUB) 626.9 N93.9 US Pelvis Complete     US Transvaginal Non-OB  2. Dysmenorrhea 625.3 N94.6 US Pelvis Complete     US Transvaginal Non-OB    Established relevant diagnosis(es): PCOS  Plan/Recommendations: Meds ordered this encounter  Medications  . megestrol (MEGACE) 40 MG tablet    Sig: 3 tablets a day for 5 days, 2 tablets a day for 5 days then 1 tablet daily    Dispense:  45 tablet    Refill:  3    Labs or Scans Ordered: Orders Placed This Encounter  Procedures  . US Pelvis Complete  . US Transvaginal Non-OB    Stabilize with megestrol and do sonogram in 5 weeks Then decide on chronic management  Follow up  5 weeks sonogram     Face to face time:  20 minutes  Greater than 50% of the visit time was spent in counseling and coordination of care with the patient.  The summary and outline of the counseling and care coordination is summarized in the note above.   All questions were answered.  Past Medical History  Diagnosis Date  . Acid reflux Dx 2011  . Bloating 2011   . Weight loss, abnormal   . Asthma     As a Child  . PCOS (polycystic ovarian syndrome) Dx 2011    Past Surgical History  Procedure Laterality Date  . Upper gastrointestinal endoscopy  12/05/2009  . Upper gastrointestinal endoscopy  02/07/2009  . Wisdom tooth extraction    . Esophagogastroduodenoscopy N/A 10/26/2014    Procedure: ESOPHAGOGASTRODUODENOSCOPY (EGD);  Surgeon: Rogene Houston, MD;  Location: [facility];  Service: Endoscopy;  Laterality: N/A;  . Cholecystectomy  2011    Dr.Byerly    OB History    No data available      Allergies  Allergen Reactions  . Percocet [Oxycodone-Acetaminophen] Hives and Itching  . Amoxicillin Rash    Social History   Social History  . Marital Status: Married    Spouse Name: N/A  . Number of Children: N/A  . Years of Education: N/A   Social History Main Topics  . Smoking status: Current Some Day Smoker -- 1.00 packs/day    Types: Cigarettes  . Smokeless tobacco: None     Comment: 1 pack a day  . Alcohol Use: No  . Drug Use: No  . Sexual Activity: Yes    Birth Control/ Protection: None   Other Topics Concern  . None   Social History Narrative    Family History  Problem Relation Age of Onset  . Renal Disease  Other   . Cancer - Cervical Maternal Aunt

## 2015-06-07 ENCOUNTER — Ambulatory Visit: Payer: Self-pay | Admitting: Obstetrics & Gynecology

## 2015-06-07 ENCOUNTER — Other Ambulatory Visit: Payer: Self-pay

## 2015-06-26 ENCOUNTER — Encounter: Payer: Self-pay | Admitting: Obstetrics & Gynecology

## 2015-06-26 ENCOUNTER — Ambulatory Visit (INDEPENDENT_AMBULATORY_CARE_PROVIDER_SITE_OTHER): Payer: BLUE CROSS/BLUE SHIELD | Admitting: Obstetrics & Gynecology

## 2015-06-26 ENCOUNTER — Ambulatory Visit (INDEPENDENT_AMBULATORY_CARE_PROVIDER_SITE_OTHER): Payer: BLUE CROSS/BLUE SHIELD

## 2015-06-26 VITALS — BP 130/90 | HR 72 | Wt 216.0 lb

## 2015-06-26 DIAGNOSIS — N939 Abnormal uterine and vaginal bleeding, unspecified: Secondary | ICD-10-CM

## 2015-06-26 DIAGNOSIS — N946 Dysmenorrhea, unspecified: Secondary | ICD-10-CM

## 2015-06-26 MED ORDER — NORGESTREL-ETHINYL ESTRADIOL 0.3-30 MG-MCG PO TABS: 1.0000 | ORAL_TABLET | Freq: Every day | ORAL | Status: DC

## 2015-06-26 NOTE — Progress Notes (Signed)
PELVIC US TA/TV:  anteverted uterus w/ a post. intramural fibroid 1.6 x 1.1 x 1.4cm,EEC 8.25mm,normal ov's bilat,no free fluid seen,no pain during ultrasound

## 2015-06-26 NOTE — Progress Notes (Signed)
Patient ID: Mckenzie Cooley, female   DOB: Jul 21, 1990, 25 y.o.   MRN: [id number] Follow up appointment for results  Chief Complaint  Patient presents with  . Follow-up    ultrasound today.    Blood pressure 130/90, pulse 72, weight 216 lb (97.977 kg), last menstrual period 05/03/2015.  US Transvaginal Non-ob  06/26/2015  GYNECOLOGIC SONOGRAM Mckenzie Cooley is a 25 y.o.with unknown LMP,she is here for a pelvic sonogram for abnormal uterine bleeding and PCOS. Uterus                      10.3 x 4.3 x 5.9 cm, anteverted uterus w/ a posterior intramural fibroid 1.6 x 1.1 x 1.4 cm Endometrium          8.6 mm, symmetrical, WNL Right ovary             3.2 x 2 x 2 cm, wnl Left ovary                2.5 x 2.5 x 1.7 cm, wnl No free fluid seen Technician Comments: PELVIC US TA/TV:  anteverted uterus w/ a post. intramural fibroid 1.6 x 1.1 x 1.4cm,EEC 8.49mm,normal ov's bilat,no free fluid seen,no pain during ultrasound [employer] 06/26/2015 2:19 PM Clinical Impression and recommendations: I have reviewed the sonogram results above, combined with the patient's current clinical course, below are my impressions and any appropriate recommendations for management based on the sonographic findings. Normal uterus with clinically insignificant fibroid Ovaries appear normal, do not have appearance of PCOS Mckenzie Cooley 06/26/2015 2:49 PM   US Pelvis Complete  06/26/2015  GYNECOLOGIC SONOGRAM Mckenzie Cooley is a 25 y.o.with unknown LMP,she is here for a pelvic sonogram for abnormal uterine bleeding and PCOS. Uterus                      10.3 x 4.3 x 5.9 cm, anteverted uterus w/ a posterior intramural fibroid 1.6 x 1.1 x 1.4 cm Endometrium          8.6 mm, symmetrical, WNL Right ovary             3.2 x 2 x 2 cm, wnl Left ovary                2.5 x 2.5 x 1.7 cm, wnl No free fluid seen Technician Comments: PELVIC US TA/TV:  anteverted uterus w/ a post. intramural fibroid 1.6 x 1.1 x 1.4cm,EEC 8.84mm,normal ov's bilat,no free fluid  seen,no pain during ultrasound [employer] 06/26/2015 2:19 PM Clinical Impression and recommendations: I have reviewed the sonogram results above, combined with the patient's current clinical course, below are my impressions and any appropriate recommendations for management based on the sonographic findings. Normal uterus with clinically insignificant fibroid Ovaries appear normal, do not have appearance of PCOS Mckenzie Cooley 06/26/2015 2:49 PM    Pt with lifelong history of irregular bleeding see previous note  Sonogram is normal today not evidence of PCOS  MEDS ordered this encounter: No orders of the defined types were placed in this encounter.    Orders for this encounter: No orders of the defined types were placed in this encounter.    Plan: Off megace x 2 weeks then start OCP x 6 months Begin clomid after month 6 Follow Up: 5 months     Face to face time:  15 minutes  Greater than 50% of the visit time was spent in counseling and coordination  of care with the patient.  The summary and outline of the counseling and care coordination is summarized in the note above.   All questions were answered.  Past Medical History  Diagnosis Date  . Acid reflux Dx 2011  . Bloating 2011  . Weight loss, abnormal   . Asthma     As a Child  . PCOS (polycystic ovarian syndrome) Dx 2011    Past Surgical History  Procedure Laterality Date  . Upper gastrointestinal endoscopy  12/05/2009  . Upper gastrointestinal endoscopy  02/07/2009  . Wisdom tooth extraction    . Esophagogastroduodenoscopy N/A 10/26/2014    Procedure: ESOPHAGOGASTRODUODENOSCOPY (EGD);  Surgeon: Rogene Houston, MD;  Location: [facility];  Service: Endoscopy;  Laterality: N/A;  . Cholecystectomy  2011    Dr.Byerly    OB History    No data available      Allergies  Allergen Reactions  . Percocet [Oxycodone-Acetaminophen] Hives and Itching  . Amoxicillin Rash    Social History   Social History  . Marital  Status: Married    Spouse Name: N/A  . Number of Children: N/A  . Years of Education: N/A   Social History Main Topics  . Smoking status: Current Some Day Smoker -- 1.00 packs/day    Types: Cigarettes  . Smokeless tobacco: None     Comment: 1 pack a day  . Alcohol Use: No  . Drug Use: No  . Sexual Activity: Yes    Birth Control/ Protection: None   Other Topics Concern  . None   Social History Narrative    Family History  Problem Relation Age of Onset  . Renal Disease Other   . Cancer - Cervical Maternal Aunt

## 2015-07-17 ENCOUNTER — Telehealth: Payer: Self-pay | Admitting: Obstetrics & Gynecology

## 2015-07-17 MED ORDER — KETOROLAC TROMETHAMINE 10 MG PO TABS: 10.0000 mg | ORAL_TABLET | Freq: Three times a day (TID) | ORAL | Status: DC | PRN

## 2015-07-17 NOTE — Telephone Encounter (Signed)
Pt states that since she has been off of the megace she has had pain and cramping with her period. Pt states that she is taking 9 200mg  Ibuprofen every day and not getting any relief. Pt wants to know what she should do. Pt aware that Dr. Elonda Husky is gone for the day and that I would send the message to Dr. Elonda Husky. Pt verbalized understanding.

## 2015-10-18 ENCOUNTER — Encounter (INDEPENDENT_AMBULATORY_CARE_PROVIDER_SITE_OTHER): Payer: Self-pay | Admitting: *Deleted

## 2015-11-07 ENCOUNTER — Encounter (HOSPITAL_COMMUNITY): Payer: Self-pay

## 2015-11-07 ENCOUNTER — Emergency Department (HOSPITAL_COMMUNITY)
Admission: EM | Admit: 2015-11-07 | Discharge: 2015-11-07 | Disposition: A | Discharge status: Home / Self Care | Payer: BLUE CROSS/BLUE SHIELD | Attending: Emergency Medicine | Admitting: Emergency Medicine

## 2015-11-07 DIAGNOSIS — F17210 Nicotine dependence, cigarettes, uncomplicated: Secondary | ICD-10-CM | POA: Insufficient documentation

## 2015-11-07 DIAGNOSIS — N12 Tubulo-interstitial nephritis, not specified as acute or chronic: Secondary | ICD-10-CM | POA: Diagnosis not present

## 2015-11-07 DIAGNOSIS — Z791 Long term (current) use of non-steroidal anti-inflammatories (NSAID): Secondary | ICD-10-CM | POA: Diagnosis not present

## 2015-11-07 DIAGNOSIS — Z79899 Other long term (current) drug therapy: Secondary | ICD-10-CM | POA: Insufficient documentation

## 2015-11-07 DIAGNOSIS — R112 Nausea with vomiting, unspecified: Secondary | ICD-10-CM | POA: Diagnosis not present

## 2015-11-07 DIAGNOSIS — R1030 Lower abdominal pain, unspecified: Secondary | ICD-10-CM | POA: Diagnosis present

## 2015-11-07 LAB — BASIC METABOLIC PANEL
ANION GAP: 6 (ref 5–15)
BUN: 9 mg/dL (ref 6–20)
CALCIUM: 8.8 mg/dL — AB (ref 8.9–10.3)
CO2: 25 mmol/L (ref 22–32)
CREATININE: 0.71 mg/dL (ref 0.44–1.00)
Chloride: 105 mmol/L (ref 101–111)
GFR calc Af Amer: >60 mL/min (ref >60)
GLUCOSE: 104 mg/dL — AB (ref 65–99)
Potassium: 3.7 mmol/L (ref 3.5–5.1)
Sodium: 136 mmol/L (ref 135–145)

## 2015-11-07 LAB — CBC WITH DIFFERENTIAL/PLATELET
BASOS ABS: 0 10*3/uL (ref 0.0–0.1)
Basophils Relative: 0 %
EOS PCT: 1 %
Eosinophils Absolute: 0.2 10*3/uL (ref 0.0–0.7)
HCT: 43.3 % (ref 36.0–46.0)
Hemoglobin: 14.5 g/dL (ref 12.0–15.0)
LYMPHS PCT: 16 %
Lymphs Abs: 2 10*3/uL (ref 0.7–4.0)
MCH: 27.3 pg (ref 26.0–34.0)
MCHC: 33.5 g/dL (ref 30.0–36.0)
MCV: 81.4 fL (ref 78.0–100.0)
MONO ABS: 1.2 10*3/uL — AB (ref 0.1–1.0)
Monocytes Relative: 10 %
Neutro Abs: 9.4 10*3/uL — ABNORMAL HIGH (ref 1.7–7.7)
Neutrophils Relative %: 73 %
PLATELETS: 329 10*3/uL (ref 150–400)
RBC: 5.32 MIL/uL — ABNORMAL HIGH (ref 3.87–5.11)
RDW: 13.4 % (ref 11.5–15.5)
WBC: 12.8 10*3/uL — ABNORMAL HIGH (ref 4.0–10.5)

## 2015-11-07 LAB — URINALYSIS, ROUTINE W REFLEX MICROSCOPIC
Bilirubin Urine: NEGATIVE
GLUCOSE, UA: NEGATIVE mg/dL
Ketones, ur: 15 mg/dL — AB
Nitrite: POSITIVE — AB
PROTEIN: 100 mg/dL — AB
SPECIFIC GRAVITY, URINE: 1.025 (ref 1.005–1.030)
pH: 6 (ref 5.0–8.0)

## 2015-11-07 LAB — URINE MICROSCOPIC-ADD ON

## 2015-11-07 LAB — PREGNANCY, URINE: PREG TEST UR: NEGATIVE

## 2015-11-07 MED ORDER — MORPHINE SULFATE (PF) 4 MG/ML IV SOLN
4.0000 mg | Freq: Once | INTRAVENOUS | Status: AC
  Administered 2015-11-07: 4 mg via INTRAVENOUS
  Filled 2015-11-07: qty 1

## 2015-11-07 MED ORDER — ONDANSETRON 4 MG PO TBDP: 4.0000 mg | ORAL_TABLET | Freq: Three times a day (TID) | ORAL | Status: DC | PRN

## 2015-11-07 MED ORDER — DEXTROSE 5 % IV SOLN
1.0000 g | Freq: Once | INTRAVENOUS | Status: AC
  Administered 2015-11-07: 1 g via INTRAVENOUS
  Filled 2015-11-07: qty 10

## 2015-11-07 MED ORDER — SODIUM CHLORIDE 0.9 % IV BOLUS (SEPSIS)
1000.0000 mL | Freq: Once | INTRAVENOUS | Status: AC
  Administered 2015-11-07: 1000 mL via INTRAVENOUS

## 2015-11-07 MED ORDER — CIPROFLOXACIN HCL 500 MG PO TABS: 500.0000 mg | ORAL_TABLET | Freq: Two times a day (BID) | ORAL | Status: DC

## 2015-11-07 MED ORDER — ONDANSETRON HCL 4 MG/2ML IJ SOLN
4.0000 mg | Freq: Once | INTRAMUSCULAR | Status: AC
  Administered 2015-11-07: 4 mg via INTRAVENOUS
  Filled 2015-11-07: qty 2

## 2015-11-07 NOTE — Discharge Instructions (Signed)

## 2015-11-07 NOTE — ED Provider Notes (Signed)
CSN: [id number]     Arrival date & time 11/07/15  0608 History   First MD Initiated Contact with Patient 11/07/15 [phone]     Chief Complaint  Patient presents with  . Flank Pain     (Consider location/radiation/quality/duration/timing/severity/associated sxs/prior Treatment) HPI  This is a 25 year old female with history of PCO S2 presents with left flank pain. Reports onset of symptoms yesterday. Reports that it is sharp. She also has pain in her lower abdomen.  Current pain is 8 out of 10. She's not taken anything at home for pain. She reports associated pressure with urination and frequent urination. History of UTIs. But, "never this bad." Reports nausea without vomiting. Denies diarrhea.  Past Medical History  Diagnosis Date  . Acid reflux Dx 2011  . Bloating 2011  . Weight loss, abnormal   . Asthma     As a Child  . PCOS (polycystic ovarian syndrome) Dx 2011   Past Surgical History  Procedure Laterality Date  . Upper gastrointestinal endoscopy  12/05/2009  . Upper gastrointestinal endoscopy  02/07/2009  . Wisdom tooth extraction    . Esophagogastroduodenoscopy N/A 10/26/2014    Procedure: ESOPHAGOGASTRODUODENOSCOPY (EGD);  Surgeon: Rogene Houston, MD;  [facility]: [facility];  Service: Endoscopy;  Laterality: N/A;  . Cholecystectomy  2011    Dr.Byerly   Family History  Problem Relation Age of Onset  . Renal Disease Other   . Cancer - Cervical Maternal Aunt    Social History  Substance Use Topics  . Smoking status: Current Some Day Smoker -- 1.00 packs/day    Types: Cigarettes  . Smokeless tobacco: None     Comment: 1 pack a day  . Alcohol Use: No   OB History    No data available     Review of Systems  Constitutional: Positive for chills. Negative for fever.  Respiratory: Negative for shortness of breath.   Cardiovascular: Negative for chest pain.  Gastrointestinal: Positive for nausea. Negative for vomiting.  Genitourinary: Positive for dysuria, frequency  and flank pain. Negative for vaginal bleeding and vaginal pain.  All other systems reviewed and are negative.     Allergies  Percocet and Amoxicillin  Home Medications   Prior to Admission medications   Medication Sig Start Date End Date Taking? Authorizing Provider  ibuprofen (ADVIL,MOTRIN) 800 MG tablet Take 1 tablet (800 mg total) by mouth 3 (three) times daily. 04/29/15  Yes Orpah Greek, MD  norgestrel-ethinyl estradiol (LO/OVRAL,CRYSELLE) 0.3-30 MG-MCG tablet Take 1 tablet by mouth daily. 06/26/15  Yes Florian Buff, MD  ranitidine (ZANTAC) 150 MG tablet Take 150 mg by mouth daily.    Yes Historical Provider, MD  ketorolac (TORADOL) 10 MG tablet Take 1 tablet (10 mg total) by mouth every 8 (eight) hours as needed. 07/17/15   Florian Buff, MD  megestrol (MEGACE) 40 MG tablet 3 tablets a day for 5 days, 2 tablets a day for 5 days then 1 tablet daily 05/03/15   Florian Buff, MD  traMADol (ULTRAM) 50 MG tablet Take 1 tablet (50 mg total) by mouth every 6 (six) hours as needed. Patient not taking: Reported on 06/26/2015 04/29/15   Orpah Greek, MD   BP 131/72 mmHg  Pulse 72  Temp(Src) 97.9 F (36.6 C) (Oral)  Resp 20  Ht 5' (1.524 m)  Wt 215 lb (97.523 kg)  BMI 41.99 kg/m2  SpO2 100%  LMP 11/03/2015 Physical Exam  Constitutional: She is oriented to person, place, and  time. She appears well-developed and well-nourished. No distress.  HENT:  Head: Normocephalic and atraumatic.  Cardiovascular: Normal rate, regular rhythm and normal heart sounds.   Pulmonary/Chest: Effort normal and breath sounds normal. No respiratory distress. She has no wheezes.  Abdominal: Soft. Bowel sounds are normal. There is tenderness. There is no rebound and no guarding.  Mild tenderness to palpation suprapubic region without rebound or guarding, positive left CVA tenderness  Neurological: She is alert and oriented to person, place, and time.  Skin: Skin is warm and dry.  Psychiatric: She  has a normal mood and affect.  Nursing note and vitals reviewed.   ED Course  Procedures (including critical care time) Labs Review Labs Reviewed  URINALYSIS, ROUTINE W REFLEX MICROSCOPIC (NOT AT [facility]) - Abnormal; Notable for the following:    APPearance HAZY (*)    Hgb urine dipstick LARGE (*)    Ketones, ur 15 (*)    Protein, ur 100 (*)    Nitrite POSITIVE (*)    Leukocytes, UA MODERATE (*)    All other components within normal limits  URINE MICROSCOPIC-ADD ON - Abnormal; Notable for the following:    Squamous Epithelial / LPF TOO NUMEROUS TO COUNT (*)    Bacteria, UA MANY (*)    All other components within normal limits  URINE CULTURE  PREGNANCY, URINE  CBC WITH DIFFERENTIAL/PLATELET  BASIC METABOLIC PANEL    Imaging Review No results found. I have personally reviewed and evaluated these images and lab results as part of my medical decision-making.   EKG Interpretation None      MDM   Final diagnoses:  Pyelonephritis    Patient presents with left flank pain and urinary symptoms. Likely pyelonephritis. She is nontoxic-appearing. Afebrile. She is given fluids and pain medication. Urinalysis with nitrite positive too numerous to count white cells.  Patient given IV Rocephin. Basic labwork was also obtained to evaluate for extent of leukocytosis. If patient remains non-ill-appearing, she can be discharged with an outpatient course of treatment.    Merryl Hacker, MD 11/11/15 [phone]

## 2015-11-07 NOTE — ED Notes (Signed)
Pt c/o left flank pain since last night, worse this am, nausea, no vomiting.

## 2015-11-10 LAB — URINE CULTURE: Culture: >=100000 — AB

## 2015-11-11 ENCOUNTER — Telehealth (HOSPITAL_BASED_OUTPATIENT_CLINIC_OR_DEPARTMENT_OTHER): Payer: Self-pay | Admitting: Emergency Medicine

## 2015-11-11 NOTE — Telephone Encounter (Signed)
Post ED Visit - Positive Culture Follow-up  Culture report reviewed by antimicrobial stewardship pharmacist:  []  Elenor Quinones, Pharm.D. []  Heide Guile, Pharm.D., BCPS []  Parks Neptune, Pharm.D. []  Alycia Rossetti, Pharm.D., BCPS []  [place], [place].D., BCPS, AAHIVP []  Legrand Como, Pharm.D., BCPS, AAHIVP [x]  Milus Glazier, Pharm.D. []  Stephens November, [place].D.  Positive urine culture Treated with cipro, organism sensitive to the same and no further patient follow-up is required at this time.  Hazle Nordmann 11/11/2015, 11:06 AM

## 2015-11-19 DIAGNOSIS — Z0000 Encounter for general adult medical examination without abnormal findings: Secondary | ICD-10-CM | POA: Diagnosis not present

## 2015-11-19 DIAGNOSIS — Z716 Tobacco abuse counseling: Secondary | ICD-10-CM | POA: Diagnosis not present

## 2015-11-19 DIAGNOSIS — Z23 Encounter for immunization: Secondary | ICD-10-CM | POA: Diagnosis not present

## 2015-11-19 DIAGNOSIS — Z720 Tobacco use: Secondary | ICD-10-CM | POA: Diagnosis not present

## 2015-11-25 ENCOUNTER — Encounter: Payer: Self-pay | Admitting: Obstetrics & Gynecology

## 2015-11-25 ENCOUNTER — Ambulatory Visit (INDEPENDENT_AMBULATORY_CARE_PROVIDER_SITE_OTHER): Payer: BLUE CROSS/BLUE SHIELD | Admitting: Obstetrics & Gynecology

## 2015-11-25 VITALS — BP 112/78 | HR 60 | Ht 60.0 in | Wt 212.5 lb

## 2015-11-25 DIAGNOSIS — Z87440 Personal history of urinary (tract) infections: Secondary | ICD-10-CM

## 2015-11-25 DIAGNOSIS — E282 Polycystic ovarian syndrome: Secondary | ICD-10-CM

## 2015-11-25 LAB — POCT URINALYSIS DIPSTICK
Blood, UA: NEGATIVE
GLUCOSE UA: NEGATIVE
Ketones, UA: NEGATIVE
Leukocytes, UA: NEGATIVE
NITRITE UA: NEGATIVE
Protein, UA: NEGATIVE

## 2015-11-25 MED ORDER — CLOMIPHENE CITRATE 50 MG PO TABS: 50.0000 mg | ORAL_TABLET | Freq: Every day | ORAL | Status: DC

## 2015-11-25 NOTE — Progress Notes (Signed)
Patient ID: Mckenzie Cooley, female   DOB: April 21, 1991, 25 y.o.   MRN: [id number]      Chief Complaint  Patient presents with  . Follow-up    discuss starting Clomid, pt seen at [place] and treated for UTI    Blood pressure 112/78, pulse 60, height 5' (1.524 m), weight 212 lb 8 oz (96.389 kg), last menstrual period 11/03/2015.  25 y.o. No obstetric history on file. Patient's last menstrual period was 11/03/2015. The current method of family planning is OCP (estrogen/progesterone).  Subjective Ready to begin ovulation induction in a PCOS patient(not androgen excess, not hyperglycemis)  Objective   Pertinent ROS Recent UTI resolved  Labs or studies reviewed    Impression Diagnoses this Encounter::   ICD-9-CM ICD-10-CM   1. PCOS (polycystic ovarian syndrome) 256.4 E28.2   2. History of UTI V13.02 Z87.440 POCT urinalysis dipstick    Established relevant diagnosis(es):   Plan/Recommendations: Meds ordered this encounter  Medications  . varenicline (CHANTIX) 1 MG tablet    Sig: Take 1 mg by mouth.  . clomiPHENE (CLOMID) 50 MG tablet    Sig: Take 1 tablet (50 mg total) by mouth daily.    Dispense:  5 tablet    Refill:  11    Labs or Scans Ordered: Orders Placed This Encounter  Procedures  . POCT urinalysis dipstick    Management:: Ovulation induction on clomid  Follow up Return if symptoms worsen or fail to improve.        Face to face time:  15 minutes  Greater than 50% of the visit time was spent in counseling and coordination of care with the patient.  The summary and outline of the counseling and care coordination is summarized in the note above.   All questions were answered.

## 2015-11-27 ENCOUNTER — Telehealth: Payer: Self-pay | Admitting: Obstetrics & Gynecology

## 2015-11-27 NOTE — Telephone Encounter (Signed)
Dr. Elonda Husky, pt states stated her period today, is going to take Clomid and was told needs an U/S.  Please advise.

## 2015-11-29 ENCOUNTER — Telehealth: Payer: Self-pay | Admitting: *Deleted

## 2015-11-29 NOTE — Telephone Encounter (Signed)
Schedule a pelvic sonogram for 12/09/2015 then see me after the sonogram Sonogram to evaluate follicular size

## 2015-11-29 NOTE — Telephone Encounter (Signed)
Pt states started her period, still have 2 birth control pills left, does she need to start clomid. Pt informed per Dr.Eure needs to finish birth control then start clomid, schedule ultrasound and  appt with Dr. Elonda Husky on 12/11/2015.  Appt scheduled and pt verbalized understanding.

## 2015-12-09 ENCOUNTER — Other Ambulatory Visit: Payer: Self-pay | Admitting: Obstetrics & Gynecology

## 2015-12-09 DIAGNOSIS — N970 Female infertility associated with anovulation: Secondary | ICD-10-CM

## 2015-12-11 ENCOUNTER — Ambulatory Visit (INDEPENDENT_AMBULATORY_CARE_PROVIDER_SITE_OTHER): Payer: BLUE CROSS/BLUE SHIELD

## 2015-12-11 ENCOUNTER — Encounter: Payer: Self-pay | Admitting: Obstetrics & Gynecology

## 2015-12-11 ENCOUNTER — Ambulatory Visit (INDEPENDENT_AMBULATORY_CARE_PROVIDER_SITE_OTHER): Payer: BLUE CROSS/BLUE SHIELD | Admitting: Obstetrics & Gynecology

## 2015-12-11 DIAGNOSIS — D251 Intramural leiomyoma of uterus: Secondary | ICD-10-CM | POA: Diagnosis not present

## 2015-12-11 DIAGNOSIS — E282 Polycystic ovarian syndrome: Secondary | ICD-10-CM | POA: Diagnosis not present

## 2015-12-11 DIAGNOSIS — N854 Malposition of uterus: Secondary | ICD-10-CM | POA: Diagnosis not present

## 2015-12-11 DIAGNOSIS — N970 Female infertility associated with anovulation: Secondary | ICD-10-CM

## 2015-12-11 NOTE — Progress Notes (Signed)
PELVIC US TA/TV:anteverted uterus w/a post intramural fibroid 1.9 x 1.5 x 1.1 cm,EEC 8.89mm,normal lt ov,dominate follicle rt 2.2 x 2.1 x 2.1cm,no free fluid

## 2015-12-30 ENCOUNTER — Telehealth: Payer: Self-pay | Admitting: Obstetrics & Gynecology

## 2015-12-30 NOTE — Telephone Encounter (Signed)
Pt states took Clomid as prescribed, does she need to take prenatal vitamins with the Clomid to increase probability of pregnancy?

## 2015-12-31 NOTE — Telephone Encounter (Signed)
Not necessary, will not improve pregnancy rate but a good idea in general

## 2015-12-31 NOTE — Telephone Encounter (Signed)
Pt informed of Dr.Eure recommendation and verbalized understanding

## 2016-01-10 ENCOUNTER — Encounter (INDEPENDENT_AMBULATORY_CARE_PROVIDER_SITE_OTHER): Payer: Self-pay | Admitting: Internal Medicine

## 2016-01-10 ENCOUNTER — Ambulatory Visit (INDEPENDENT_AMBULATORY_CARE_PROVIDER_SITE_OTHER): Payer: Self-pay | Admitting: Internal Medicine

## 2016-02-28 ENCOUNTER — Telehealth: Payer: Self-pay | Admitting: Obstetrics & Gynecology

## 2016-02-28 ENCOUNTER — Other Ambulatory Visit: Payer: Self-pay | Admitting: *Deleted

## 2016-02-28 DIAGNOSIS — Z319 Encounter for procreative management, unspecified: Secondary | ICD-10-CM

## 2016-02-28 NOTE — Telephone Encounter (Signed)
Pt states first day of last period was 0805-01/27/2016, taking clomid on the last day of period and takes x 5 day. No period this month.  Per Dr. Glo Herring do [facility] and Progesterone. Pt states she is going to go to [place] today.

## 2016-02-28 NOTE — Telephone Encounter (Signed)
Pt called stating that she has taken her medication and Pt is 6 days late of her period and she has taken 3 pregnancy test and they were all neg. Pt wants to know what she has to do. Please contact pt

## 2016-02-29 LAB — BETA HCG QUANT (REF LAB): hCG Quant: <1 m[IU]/mL

## 2016-02-29 LAB — PROGESTERONE: PROGESTERONE: 0.2 ng/mL

## 2016-03-02 ENCOUNTER — Telehealth: Payer: Self-pay | Admitting: *Deleted

## 2016-03-02 NOTE — Telephone Encounter (Signed)
Pt informed of QHCG < 1, and Progesterone of 0.2. Pt states first day of last period was 08/05 - 01/27/2016, taking clomid on the last day of period and takes x 5 days, No period this month.  Please advise of next step?

## 2016-03-03 ENCOUNTER — Other Ambulatory Visit: Payer: Self-pay | Admitting: Obstetrics & Gynecology

## 2016-03-03 MED ORDER — CLOMIPHENE CITRATE 50 MG PO TABS: 100.0000 mg | ORAL_TABLET | Freq: Every day | ORAL | 11 refills | Status: DC

## 2016-03-03 MED ORDER — MEDROXYPROGESTERONE ACETATE 10 MG PO TABS: 10.0000 mg | ORAL_TABLET | Freq: Every day | ORAL | 6 refills | Status: DC

## 2016-03-03 NOTE — Telephone Encounter (Signed)
Take provera x 10 days then start clomid 100 mg on day her period begins

## 2016-03-03 NOTE — Telephone Encounter (Signed)
Pt informed per Dr. Elonda Husky start Provera x 10 days, when period begins start the Clomid 100 mg. Pt verbalized understanding.

## 2016-03-04 NOTE — Progress Notes (Signed)
Follow up appointment for results  Chief Complaint  Patient presents with  . Follow-up    ultrasound today    Last menstrual period 11/29/2015.    GYNECOLOGIC SONOGRAM   Mckenzie Cooley is a 25 y.o. LMP 11/29/2015 for a pelvic sonogram to check follicle size.  Uterus                      8.5 x 4.1 x 5.1 cm, anteverted uterus w/a post intramural fibroid 1.9 x 1.5 x 1.1 cm  Endometrium          8.4 mm, symmetrical, wnl  Right ovary             3.4 x 2.7 x 3.3 cm, dominate follicle rt 2.2 x 2.1 x 2.1cm  Left ovary                3.0 x 1.8 x 3.1 cm, wnl  No free fluid   Technician Comments:  PELVIC US TA/TV: anteverted uterus w/a post intramural fibroid 1.9 x 1.5 x 1.1 cm ,EEC 8.40mm,normal lt ov,dominate follicle rt 2.2 x 2.1 x 2.1cm,no free fluid      Silver Huguenin 12/11/2015 3:19 PM  Clinical Impression and recommendations:  I have reviewed the sonogram results above, combined with the patient's current clinical course, below are my impressions and any appropriate recommendations for management based on the sonographic findings.  Normal uterus small myoma, clinically insignificant Good endometrial stripe development dominant follicle right ovary, 2.2 cm   Dois Juarbe H 12/11/2015 3:23 PM  MEDS ordered this encounter: No orders of the defined types were placed in this encounter.   Orders for this encounter: No orders of the defined types were placed in this encounter.   Plan: Anovulation and PCOS Provera followed by clomid  Follow Up:     Face to face time:  10 minutes  Greater than 50% of the visit time was spent in counseling and coordination of care with the patient.  The summary and outline of the counseling and care coordination is summarized in the note above.   All questions were answered.  Past Medical History:  Diagnosis Date  . Acid reflux Dx 2011  . Asthma    As a Child  . Bloating 2011  . PCOS (polycystic ovarian  syndrome) Dx 2011  . Weight loss, abnormal     Past Surgical History:  Procedure Laterality Date  . CHOLECYSTECTOMY  2011   Dr.Byerly  . ESOPHAGOGASTRODUODENOSCOPY N/A 10/26/2014   Procedure: ESOPHAGOGASTRODUODENOSCOPY (EGD);  Surgeon: Rogene Houston, MD;  Location: [facility];  Service: Endoscopy;  Laterality: N/A;  . UPPER GASTROINTESTINAL ENDOSCOPY  12/05/2009  . UPPER GASTROINTESTINAL ENDOSCOPY  02/07/2009  . WISDOM TOOTH EXTRACTION      OB History    No data available      Allergies  Allergen Reactions  . Percocet [Oxycodone-Acetaminophen] Hives and Itching  . Amoxicillin Rash    Social History   Social History  . Marital status: Married    Spouse name: N/A  . Number of children: N/A  . Years of education: N/A   Social History Main Topics  . Smoking status: Current Some Day Smoker    Packs/day: 0.50    Types: Cigarettes  . Smokeless tobacco: Never Used     Comment: 1 pack a day  . Alcohol use No  . Drug use: No  . Sexual activity: Yes    Birth control/ protection: None  Other Topics Concern  . None   Social History Narrative  . None    Family History  Problem Relation Age of Onset  . Renal Disease Other   . Cancer - Cervical Maternal Aunt

## 2016-03-18 ENCOUNTER — Encounter (HOSPITAL_COMMUNITY): Payer: Self-pay

## 2016-03-18 ENCOUNTER — Emergency Department (HOSPITAL_COMMUNITY)
Admission: EM | Admit: 2016-03-18 | Discharge: 2016-03-18 | Disposition: A | Discharge status: Home / Self Care | Payer: BLUE CROSS/BLUE SHIELD | Attending: Emergency Medicine | Admitting: Emergency Medicine

## 2016-03-18 DIAGNOSIS — Z79899 Other long term (current) drug therapy: Secondary | ICD-10-CM | POA: Diagnosis not present

## 2016-03-18 DIAGNOSIS — J45909 Unspecified asthma, uncomplicated: Secondary | ICD-10-CM | POA: Diagnosis not present

## 2016-03-18 DIAGNOSIS — Z791 Long term (current) use of non-steroidal anti-inflammatories (NSAID): Secondary | ICD-10-CM | POA: Insufficient documentation

## 2016-03-18 DIAGNOSIS — R51 Headache: Secondary | ICD-10-CM | POA: Diagnosis not present

## 2016-03-18 DIAGNOSIS — J039 Acute tonsillitis, unspecified: Secondary | ICD-10-CM | POA: Diagnosis not present

## 2016-03-18 DIAGNOSIS — F17210 Nicotine dependence, cigarettes, uncomplicated: Secondary | ICD-10-CM | POA: Diagnosis not present

## 2016-03-18 DIAGNOSIS — J029 Acute pharyngitis, unspecified: Secondary | ICD-10-CM | POA: Diagnosis present

## 2016-03-18 LAB — RAPID STREP SCREEN (MED CTR MEBANE ONLY): Streptococcus, Group A Screen (Direct): NEGATIVE

## 2016-03-18 MED ORDER — ACETAMINOPHEN 325 MG PO TABS
650.0000 mg | ORAL_TABLET | Freq: Once | ORAL | Status: AC
  Administered 2016-03-18: 650 mg via ORAL
  Filled 2016-03-18: qty 2

## 2016-03-18 MED ORDER — IBUPROFEN 800 MG PO TABS
800.0000 mg | ORAL_TABLET | Freq: Once | ORAL | Status: AC
  Administered 2016-03-18: 800 mg via ORAL
  Filled 2016-03-18: qty 1

## 2016-03-18 MED ORDER — LIDOCAINE VISCOUS 2 % MT SOLN
15.0000 mL | Freq: Once | OROMUCOSAL | Status: AC
  Administered 2016-03-18: 15 mL via OROMUCOSAL
  Filled 2016-03-18: qty 15

## 2016-03-18 NOTE — ED Triage Notes (Signed)
Sore throat x2 days. History to tonsillitis per patient.

## 2016-03-18 NOTE — Discharge Instructions (Signed)
Please use salt water gargles 3 or 4 times daily. Use Chloraseptic spray or gargle before meals to assist with pain. Use 600 mg of ibuprofen with each meal and at bedtime over the next 3 days, and then as needed. Please see your primary physician or return to the emergency department if any high fever, or worsening of your condition.

## 2016-03-18 NOTE — ED Provider Notes (Signed)
[facility] DEPT Provider Note   CSN: [id number] Arrival date & time: 03/18/16  1924     History   Chief Complaint Chief Complaint  Patient presents with  . Sore Throat    HPI Mckenzie Cooley is a 25 y.o. female.  The history is provided by the patient.  Sore Throat  This is a new problem. The current episode started 2 days ago. The problem occurs constantly. The problem has been gradually worsening. Associated symptoms include headaches. Pertinent negatives include no chest pain, no abdominal pain and no shortness of breath. Nothing aggravates the symptoms. Nothing relieves the symptoms. She has tried acetaminophen for the symptoms. The treatment provided no relief.    Past Medical History:  Diagnosis Date  . Acid reflux Dx 2011  . Asthma    As a Child  . Bloating 2011  . PCOS (polycystic ovarian syndrome) Dx 2011  . Weight loss, abnormal     Patient Active Problem List   Diagnosis Date Noted  . Constipation 10/27/2014  . Epigastric pain 10/26/2014  . Abdominal pain 10/26/2014  . PCOS (polycystic ovarian syndrome) 09/27/2014  . Abdominal pain, epigastric 09/27/2014  . Smoker 09/27/2014  . Intermittent constipation 09/27/2014  . GERD (gastroesophageal reflux disease) 07/01/2011  . Asthma 07/01/2011    Past Surgical History:  Procedure Laterality Date  . CHOLECYSTECTOMY  2011   Dr.Byerly  . ESOPHAGOGASTRODUODENOSCOPY N/A 10/26/2014   Procedure: ESOPHAGOGASTRODUODENOSCOPY (EGD);  Surgeon: Rogene Houston, MD;  Location: [facility];  Service: Endoscopy;  Laterality: N/A;  . UPPER GASTROINTESTINAL ENDOSCOPY  12/05/2009  . UPPER GASTROINTESTINAL ENDOSCOPY  02/07/2009  . WISDOM TOOTH EXTRACTION      OB History    No data available       Home Medications    Prior to Admission medications   Medication Sig Start Date End Date Taking? Authorizing Provider  clomiPHENE (CLOMID) 50 MG tablet Take 2 tablets (100 mg total) by mouth daily. 03/03/16   Florian Buff, MD  ibuprofen (ADVIL,MOTRIN) 800 MG tablet Take 1 tablet (800 mg total) by mouth 3 (three) times daily. 04/29/15   Orpah Greek, MD  medroxyPROGESTERone (PROVERA) 10 MG tablet Take 1 tablet (10 mg total) by mouth daily. 03/03/16   Florian Buff, MD  norgestrel-ethinyl estradiol (LO/OVRAL,CRYSELLE) 0.3-30 MG-MCG tablet Take 1 tablet by mouth daily. Patient not taking: Reported on 12/11/2015 06/26/15   Florian Buff, MD  ondansetron (ZOFRAN ODT) 4 MG disintegrating tablet Take 1 tablet (4 mg total) by mouth every 8 (eight) hours as needed for nausea or vomiting. 11/07/15   Merryl Hacker, MD  ranitidine (ZANTAC) 150 MG tablet Take 150 mg by mouth daily.     Historical Provider, MD    Family History Family History  Problem Relation Age of Onset  . Cancer - Cervical Maternal Aunt   . Renal Disease Other     Social History Social History  Substance Use Topics  . Smoking status: Current Some Day Smoker    Packs/day: 0.50    Types: Cigarettes  . Smokeless tobacco: Never Used     Comment: 1 pack a day  . Alcohol use No     Allergies   Percocet [oxycodone-acetaminophen] and Amoxicillin   Review of Systems Review of Systems  HENT: Positive for sore throat.   Respiratory: Negative for shortness of breath.   Cardiovascular: Negative for chest pain.  Gastrointestinal: Negative for abdominal pain.  Neurological: Positive for headaches.  Physical Exam Updated Vital Signs BP 140/84 (BP Location: Left Arm)   Pulse 70   Temp 99.1 F (37.3 C) (Oral)   Resp 24   Ht 5' (1.524 m)   Wt 98 kg   LMP 01/25/2016   SpO2 100%   BMI 42.18 kg/m   Physical Exam  Constitutional: She is oriented to person, place, and time. She appears well-developed and well-nourished.  Non-toxic appearance.  HENT:  Head: Normocephalic.  Right Ear: Tympanic membrane and external ear normal.  Left Ear: Tympanic membrane and external ear normal.  Mouth/Throat: Uvula is midline and mucous  membranes are normal. Uvula swelling present. Oropharyngeal exudate and posterior oropharyngeal erythema present. No tonsillar abscesses.  Eyes: EOM and lids are normal. Pupils are equal, round, and reactive to light.  Neck: Normal range of motion. Neck supple. Carotid bruit is not present.  Cardiovascular: Normal rate, regular rhythm, normal heart sounds, intact distal pulses and normal pulses.   Pulmonary/Chest: Breath sounds normal. No respiratory distress.  Abdominal: Soft. Bowel sounds are normal. There is no tenderness. There is no guarding.  Musculoskeletal: Normal range of motion.  Lymphadenopathy:       Head (right side): No submandibular adenopathy present.       Head (left side): No submandibular adenopathy present.    She has no cervical adenopathy.  Neurological: She is alert and oriented to person, place, and time. She has normal strength. No cranial nerve deficit or sensory deficit.  Skin: Skin is warm and dry.  Psychiatric: She has a normal mood and affect. Her speech is normal.  Nursing note and vitals reviewed.    ED Treatments / Results  Labs (all labs ordered are listed, but only abnormal results are displayed) Labs Reviewed  RAPID STREP SCREEN (NOT AT [facility])  CULTURE, GROUP A STREP [facility])    EKG  EKG Interpretation None       Radiology No results found.  Procedures Procedures (including critical care time)  Medications Ordered in ED Medications  ibuprofen (ADVIL,MOTRIN) tablet 800 mg (not administered)  acetaminophen (TYLENOL) tablet 650 mg (not administered)  lidocaine (XYLOCAINE) 2 % viscous mouth solution 15 mL (not administered)     Initial Impression / Assessment and Plan / ED Course  I have reviewed the triage vital signs and the nursing notes.  Pertinent labs & imaging results that were available during my care of the patient were reviewed by me and considered in my medical decision making (see chart for details).  Clinical Course     *I have reviewed nursing notes, vital signs, and all appropriate lab and imaging results for this patient.**  Final Clinical Impressions(s) / ED Diagnoses Strep test negative The exam favors tonsillitis and upper respiratory infection. The patient is asked to use salt water gargles, Chloraseptic gargles, ibuprofen every 6 hours. We also discussed the need for increasing fluids, and washing hands frequently. Patient is in agreement with this discharge plan.    Final diagnoses:  Tonsillitis    New Prescriptions New Prescriptions   No medications on file     Lily Kocher, PA-C 03/19/16 [place]ale Liu, MD 03/21/16 [phone]

## 2016-03-21 LAB — CULTURE, GROUP A STREP (THRC)

## 2016-04-13 ENCOUNTER — Telehealth: Payer: Self-pay | Admitting: Obstetrics & Gynecology

## 2016-04-13 ENCOUNTER — Other Ambulatory Visit: Payer: Self-pay | Admitting: Obstetrics & Gynecology

## 2016-04-13 MED ORDER — CIPROFLOXACIN HCL 500 MG PO TABS: 500.0000 mg | ORAL_TABLET | Freq: Two times a day (BID) | ORAL | 0 refills | Status: DC

## 2016-04-13 NOTE — Telephone Encounter (Signed)
Pt c/o "kidneys hurting, sediment in urine." pt states she has a history of kidney infections, requesting abx. AZO not helping.

## 2016-05-18 ENCOUNTER — Telehealth: Payer: Self-pay | Admitting: Obstetrics & Gynecology

## 2016-05-19 NOTE — Telephone Encounter (Signed)
Pt states she has had 3 positive pregnancy test, been taking Clomid for infertility, what should she do now? Pt given an appt for pregnancy test.

## 2016-05-20 ENCOUNTER — Encounter: Payer: Self-pay | Admitting: Adult Health

## 2016-05-20 ENCOUNTER — Ambulatory Visit (INDEPENDENT_AMBULATORY_CARE_PROVIDER_SITE_OTHER): Payer: BLUE CROSS/BLUE SHIELD | Admitting: Adult Health

## 2016-05-20 VITALS — BP 122/60 | HR 70 | Ht 60.0 in | Wt 213.0 lb

## 2016-05-20 DIAGNOSIS — O3680X0 Pregnancy with inconclusive fetal viability, not applicable or unspecified: Secondary | ICD-10-CM | POA: Insufficient documentation

## 2016-05-20 DIAGNOSIS — Z3A08 8 weeks gestation of pregnancy: Secondary | ICD-10-CM

## 2016-05-20 DIAGNOSIS — Z3201 Encounter for pregnancy test, result positive: Secondary | ICD-10-CM | POA: Diagnosis not present

## 2016-05-20 DIAGNOSIS — K5900 Constipation, unspecified: Secondary | ICD-10-CM

## 2016-05-20 DIAGNOSIS — R110 Nausea: Secondary | ICD-10-CM | POA: Diagnosis not present

## 2016-05-20 DIAGNOSIS — N926 Irregular menstruation, unspecified: Secondary | ICD-10-CM | POA: Diagnosis not present

## 2016-05-20 LAB — POCT URINE PREGNANCY: Preg Test, Ur: POSITIVE — AB

## 2016-05-20 MED ORDER — CITRANATAL B-CALM 20-1 MG & 2 X 25 MG PO MISC: ORAL | 11 refills | Status: DC

## 2016-05-20 NOTE — Progress Notes (Signed)
Subjective:     Patient ID: Mckenzie Cooley, female   DOB: 08-13-1990, 25 y.o.   MRN: [id number]  HPI Mckenzie Cooley is a 25 year old white female married in for UPT, has been trying to get pregnant for 6 years, has missed several periods, has nausea and constipation.   Review of Systems  +missed periods +nausea +constipation Reviewed past medical,surgical, social and family history. Reviewed medications and allergies.     Objective:   Physical Exam BP 122/60 (BP Location: Left Arm, Patient Position: Sitting, Cuff Size: Large)   Pulse 70   Ht 5' (1.524 m)   Wt 213 lb (96.6 kg)   LMP 01/25/2016   BMI 41.60 kg/m UPT +, LMP was 01/25/16 but US shows IUP at 8+1 with EDD 12/30/16 and FHR of 161, and +YS. Skin warm and dry. Neck: mid line trachea, normal thyroid, good ROM, no lymphadenopathy noted. Lungs: clear to ausculation bilaterally. Cardiovascular: regular rate and rhythm.Abdomen is soft and non tender. PHQ 2 score 0.    Assessment:        1. Pregnancy examination or test, positive result   2. [redacted] weeks gestation of pregnancy   3. Encounter to determine fetal viability of pregnancy, single or unspecified fetus    Plan:   Eat often   Try prunes Rx citranatal B calm #30 take daily with 12 refills Return in 1 week for dating [place] and intake visit Review handout on first trimester

## 2016-05-20 NOTE — Patient Instructions (Signed)
First Trimester of Pregnancy The first trimester of pregnancy is from week 1 until the end of week 12 (months 1 through 3). A week after a sperm fertilizes an egg, the egg will implant on the wall of the uterus. This embryo will begin to develop into a baby. Genes from you and your partner are forming the baby. The female genes determine whether the baby is a boy or a girl. At 6-8 weeks, the eyes and face are formed, and the heartbeat can be seen on ultrasound. At the end of 12 weeks, all the baby's organs are formed.  Now that you are pregnant, you will want to do everything you can to have a healthy baby. Two of the most important things are to get good prenatal care and to follow your health care provider's instructions. Prenatal care is all the medical care you receive before the baby's birth. This care will help prevent, find, and treat any problems during the pregnancy and childbirth. BODY CHANGES Your body goes through many changes during pregnancy. The changes vary from woman to woman.   You may gain or lose a couple of pounds at first.  You may feel sick to your stomach (nauseous) and throw up (vomit). If the vomiting is uncontrollable, call your health care provider.  You may tire easily.  You may develop headaches that can be relieved by medicines approved by your health care provider.  You may urinate more often. Painful urination may mean you have a bladder infection.  You may develop heartburn as a result of your pregnancy.  You may develop constipation because certain hormones are causing the muscles that push waste through your intestines to slow down.  You may develop hemorrhoids or swollen, bulging veins (varicose veins).  Your breasts may begin to grow larger and become tender. Your nipples may stick out more, and the tissue that surrounds them (areola) may become darker.  Your gums may bleed and may be sensitive to brushing and flossing.  Dark spots or blotches (chloasma,  mask of pregnancy) may develop on your face. This will likely fade after the baby is born.  Your menstrual periods will stop.  You may have a loss of appetite.  You may develop cravings for certain kinds of food.  You may have changes in your emotions from day to day, such as being excited to be pregnant or being concerned that something may go wrong with the pregnancy and baby.  You may have more vivid and strange dreams.  You may have changes in your hair. These can include thickening of your hair, rapid growth, and changes in texture. Some women also have hair loss during or after pregnancy, or hair that feels dry or thin. Your hair will most likely return to normal after your baby is born. WHAT TO EXPECT AT YOUR PRENATAL VISITS During a routine prenatal visit:  You will be weighed to make sure you and the baby are growing normally.  Your blood pressure will be taken.  Your abdomen will be measured to track your baby's growth.  The fetal heartbeat will be listened to starting around week 10 or 12 of your pregnancy.  Test results from any previous visits will be discussed. Your health care provider may ask you:  How you are feeling.  If you are feeling the baby move.  If you have had any abnormal symptoms, such as leaking fluid, bleeding, severe headaches, or abdominal cramping.  If you are using any tobacco products,   including cigarettes, chewing tobacco, and electronic cigarettes.  If you have any questions. Other tests that may be performed during your first trimester include:  Blood tests to find your blood type and to check for the presence of any previous infections. They will also be used to check for low iron levels (anemia) and Rh antibodies. Later in the pregnancy, blood tests for diabetes will be done along with other tests if problems develop.  Urine tests to check for infections, diabetes, or protein in the urine.  An ultrasound to confirm the proper growth  and development of the baby.  An amniocentesis to check for possible genetic problems.  Fetal screens for spina bifida and Down syndrome.  You may need other tests to make sure you and the baby are doing well.  HIV (human immunodeficiency virus) testing. Routine prenatal testing includes screening for HIV, unless you choose not to have this test. HOME CARE INSTRUCTIONS  Medicines   Follow your health care provider's instructions regarding medicine use. Specific medicines may be either safe or unsafe to take during pregnancy.  Take your prenatal vitamins as directed.  If you develop constipation, try taking a stool softener if your health care provider approves. Diet   Eat regular, well-balanced meals. Choose a variety of foods, such as meat or vegetable-based protein, fish, milk and low-fat dairy products, vegetables, fruits, and whole grain breads and cereals. Your health care provider will help you determine the amount of weight gain that is right for you.  Avoid raw meat and uncooked cheese. These carry germs that can cause birth defects in the baby.  Eating four or five small meals rather than three large meals a day may help relieve nausea and vomiting. If you start to feel nauseous, eating a few soda crackers can be helpful. Drinking liquids between meals instead of during meals also seems to help nausea and vomiting.  If you develop constipation, eat more high-fiber foods, such as fresh vegetables or fruit and whole grains. Drink enough fluids to keep your urine clear or pale yellow. Activity and Exercise   Exercise only as directed by your health care provider. Exercising will help you:  Control your weight.  Stay in shape.  Be prepared for labor and delivery.  Experiencing pain or cramping in the lower abdomen or low back is a good sign that you should stop exercising. Check with your health care provider before continuing normal exercises.  Try to avoid standing for  long periods of time. Move your legs often if you must stand in one place for a long time.  Avoid heavy lifting.  Wear low-heeled shoes, and practice good posture.  You may continue to have sex unless your health care provider directs you otherwise. Relief of Pain or Discomfort   Wear a good support bra for breast tenderness.   Take warm sitz baths to soothe any pain or discomfort caused by hemorrhoids. Use hemorrhoid cream if your health care provider approves.   Rest with your legs elevated if you have leg cramps or low back pain.  If you develop varicose veins in your legs, wear support hose. Elevate your feet for 15 minutes, 3-4 times a day. Limit salt in your diet. Prenatal Care   Schedule your prenatal visits by the twelfth week of pregnancy. They are usually scheduled monthly at first, then more often in the last 2 months before delivery.  Write down your questions. Take them to your prenatal visits.  Keep all your prenatal  legs, wear support hose. Elevate your feet for 15 minutes, 3-4 times a day. Limit salt in your diet.  Prenatal Care  · Schedule your prenatal visits by the twelfth week of pregnancy. They are usually scheduled monthly at first, then more often in the last 2 months before delivery.  · Write down your questions. Take them to your prenatal visits.  · Keep all your prenatal visits as directed by your health care provider.  Safety  · Wear your seat belt at all times when driving.  · Make a list of emergency phone numbers, including numbers for family, friends, the hospital, and police and fire departments.  General Tips  · Ask your health care provider for a referral to a local prenatal education class. Begin classes no later than at the beginning of month 6 of your pregnancy.  · Ask for help if you have counseling or nutritional needs during pregnancy. Your health care provider can offer advice or refer you to specialists for help with various needs.  · Do not use hot tubs, steam rooms, or saunas.  · Do not douche or use tampons or scented sanitary pads.  · Do not cross your legs for long periods of time.  · Avoid cat litter boxes and soil used by cats. These carry germs that can cause birth defects in the baby and possibly loss of the fetus by miscarriage or stillbirth.   · Avoid all smoking, herbs, alcohol, and medicines not prescribed by your health care provider. Chemicals in these affect the formation and growth of the baby.  · Do not use any tobacco products, including cigarettes, chewing tobacco, and electronic cigarettes. If you need help quitting, ask your health care provider. You may receive counseling support and other resources to help you quit.  · Schedule a dentist appointment. At home, brush your teeth with a soft toothbrush and be gentle when you floss.  SEEK MEDICAL CARE IF:   · You have dizziness.  · You have mild pelvic cramps, pelvic pressure, or nagging pain in the abdominal area.  · You have persistent nausea, vomiting, or diarrhea.  · You have a bad smelling vaginal discharge.  · You have pain with urination.  · You notice increased swelling in your face, hands, legs, or ankles.  SEEK IMMEDIATE MEDICAL CARE IF:   · You have a fever.  · You are leaking fluid from your vagina.  · You have spotting or bleeding from your vagina.  · You have severe abdominal cramping or pain.  · You have rapid weight gain or loss.  · You vomit blood or material that looks like coffee grounds.  · You are exposed to German measles and have never had them.  · You are exposed to fifth disease or chickenpox.  · You develop a severe headache.  · You have shortness of breath.  · You have any kind of trauma, such as from a fall or a car accident.     This information is not intended to replace advice given to you by your health care provider. Make sure you discuss any questions you have with your health care provider.     Document Released: 06/02/2001 Document Revised: 06/29/2014 Document Reviewed: 04/18/2013  Elsevier Interactive Patient Education ©2017 [employer]

## 2016-05-27 ENCOUNTER — Ambulatory Visit (INDEPENDENT_AMBULATORY_CARE_PROVIDER_SITE_OTHER): Payer: BLUE CROSS/BLUE SHIELD | Admitting: *Deleted

## 2016-05-27 ENCOUNTER — Encounter: Payer: Self-pay | Admitting: *Deleted

## 2016-05-27 ENCOUNTER — Ambulatory Visit (INDEPENDENT_AMBULATORY_CARE_PROVIDER_SITE_OTHER): Payer: BLUE CROSS/BLUE SHIELD

## 2016-05-27 VITALS — BP 120/68 | HR 60 | Wt 213.0 lb

## 2016-05-27 DIAGNOSIS — Z3481 Encounter for supervision of other normal pregnancy, first trimester: Secondary | ICD-10-CM | POA: Diagnosis not present

## 2016-05-27 DIAGNOSIS — Z3A10 10 weeks gestation of pregnancy: Secondary | ICD-10-CM

## 2016-05-27 DIAGNOSIS — Z331 Pregnant state, incidental: Secondary | ICD-10-CM

## 2016-05-27 DIAGNOSIS — O3680X0 Pregnancy with inconclusive fetal viability, not applicable or unspecified: Secondary | ICD-10-CM | POA: Diagnosis not present

## 2016-05-27 DIAGNOSIS — Z98891 History of uterine scar from previous surgery: Secondary | ICD-10-CM | POA: Insufficient documentation

## 2016-05-27 DIAGNOSIS — Z1389 Encounter for screening for other disorder: Secondary | ICD-10-CM

## 2016-05-27 DIAGNOSIS — O3411 Maternal care for benign tumor of corpus uteri, first trimester: Secondary | ICD-10-CM

## 2016-05-27 DIAGNOSIS — Z3401 Encounter for supervision of normal first pregnancy, first trimester: Secondary | ICD-10-CM | POA: Diagnosis not present

## 2016-05-27 LAB — POCT URINALYSIS DIPSTICK
Blood, UA: NEGATIVE
Glucose, UA: NEGATIVE
KETONES UA: NEGATIVE
LEUKOCYTES UA: NEGATIVE
Nitrite, UA: NEGATIVE
Protein, UA: NEGATIVE

## 2016-05-27 NOTE — Progress Notes (Signed)
Patient's health and obstetrical history obtained and reviewed. CCNC form completed. PN1 labs drawn. Baby scripts and mychart activated. Patient to return in 3 weeks for NT/IT and new ob visit.   Patient Active Problem List   Diagnosis Date Noted  . Encounter for supervision of other normal pregnancy 05/27/2016  . [redacted] weeks gestation of pregnancy 05/20/2016  . Pregnancy examination or test, positive result 05/20/2016  . Encounter to determine fetal viability of pregnancy 05/20/2016  . Constipation 10/27/2014  . Epigastric pain 10/26/2014  . Abdominal pain 10/26/2014  . PCOS (polycystic ovarian syndrome) 09/27/2014  . Abdominal pain, epigastric 09/27/2014  . Smoker 09/27/2014  . Intermittent constipation 09/27/2014  . GERD (gastroesophageal reflux disease) 07/01/2011  . Asthma 07/01/2011  . Gastroesophageal reflux disease 07/01/2011   Past Medical History:  Diagnosis Date  . Acid reflux Dx 2011  . Asthma    As a Child  . Bloating 2011  . PCOS (polycystic ovarian syndrome) Dx 2011  . Weight loss, abnormal    Past Surgical History:  Procedure Laterality Date  . CHOLECYSTECTOMY  2011   Dr.Byerly  . ESOPHAGOGASTRODUODENOSCOPY N/A 10/26/2014   Procedure: ESOPHAGOGASTRODUODENOSCOPY (EGD);  Surgeon: Rogene Houston, MD;  Location: [facility];  Service: Endoscopy;  Laterality: N/A;  . UPPER GASTROINTESTINAL ENDOSCOPY  12/05/2009  . UPPER GASTROINTESTINAL ENDOSCOPY  02/07/2009  . WISDOM TOOTH EXTRACTION    . WISDOM TOOTH EXTRACTION     OB History    Gravida Para Term Preterm AB Living   4 0 0   3 0   SAB TAB Ectopic Multiple Live Births   3 0 0 0 0      Current Outpatient Prescriptions:  .  docusate sodium (COLACE) 100 MG capsule, Take 100 mg by mouth 2 (two) times daily., Disp: , Rfl:  .  Prenat w/o A [id number]-FA &B6 (CITRANATAL B-CALM) 20-1 & 25 (2) MG MISC, Take daily, Disp: 30 each, Rfl: 11 .  Bisacodyl (DULCOLAX PO), Take by mouth daily., Disp: , Rfl:  Allergies   Allergen Reactions  . Percocet [Oxycodone-Acetaminophen] Hives and Itching  . Amoxicillin Rash

## 2016-05-27 NOTE — Progress Notes (Addendum)
[place] 9+1 wks,single IUP pos fht ,normal ov's bilat,crl 24.3 mm,EDD 12/29/2016 by ultrasound,post intramural fibroid

## 2016-05-27 NOTE — Assessment & Plan Note (Signed)
  [place]  Initiated Care at  9wks  FOB [place], [place]  Dating By U/S 9wk  Pap   GC/CT Initial:                36+wks:  Genetic Screen NT/IT:   CF screen declined  Anatomic [place]   Flu vaccine   Tdap Recommended ~ 28wks  Glucose Screen  2 hr  GBS   Feed Preference   Contraception   Circumcision   Childbirth Classes   Pediatrician

## 2016-05-28 LAB — VARICELLA ZOSTER ANTIBODY, IGG: VARICELLA: 1120 {index} (ref >165)

## 2016-05-28 LAB — RPR: RPR: NONREACTIVE

## 2016-05-28 LAB — PMP SCREEN PROFILE (10S), URINE
Amphetamine Screen, Ur: NEGATIVE ng/mL
BARBITURATE SCRN UR: NEGATIVE ng/mL
BENZODIAZEPINE SCREEN, URINE: NEGATIVE ng/mL
CREATININE(CRT), U: 40.4 mg/dL (ref 20.0–300.0)
Cannabinoids Ur Ql Scn: NEGATIVE ng/mL
Cocaine(Metab.)Screen, Urine: NEGATIVE ng/mL
Methadone Scn, Ur: NEGATIVE ng/mL
Opiate Scrn, Ur: NEGATIVE ng/mL
Oxycodone+Oxymorphone Ur Ql Scn: NEGATIVE ng/mL
PCP Scrn, Ur: NEGATIVE ng/mL
PH UR, DRUG SCRN: 8 (ref 4.5–8.9)
Propoxyphene, Screen: NEGATIVE ng/mL

## 2016-05-28 LAB — HIV ANTIBODY (ROUTINE TESTING W REFLEX): HIV Screen 4th Generation wRfx: NONREACTIVE

## 2016-05-28 LAB — CBC
Hematocrit: 39.4 % (ref 34.0–46.6)
Hemoglobin: 13.9 g/dL (ref 11.1–15.9)
MCH: 29.1 pg (ref 26.6–33.0)
MCHC: 35.3 g/dL (ref 31.5–35.7)
MCV: 82 fL (ref 79–97)
Platelets: 315 x10E3/uL (ref 150–379)
RBC: 4.78 x10E6/uL (ref 3.77–5.28)
RDW: 13.6 % (ref 12.3–15.4)
WBC: 10.6 x10E3/uL (ref 3.4–10.8)

## 2016-05-28 LAB — URINALYSIS, ROUTINE W REFLEX MICROSCOPIC
Bilirubin, UA: NEGATIVE
GLUCOSE, UA: NEGATIVE
Ketones, UA: NEGATIVE
LEUKOCYTES UA: NEGATIVE
Nitrite, UA: NEGATIVE
PH UA: 8.5 — AB (ref 5.0–7.5)
PROTEIN UA: NEGATIVE
RBC, UA: NEGATIVE
Specific Gravity, UA: 1.009 (ref 1.005–1.030)
UUROB: 0.2 mg/dL (ref 0.2–1.0)

## 2016-05-28 LAB — RUBELLA SCREEN: RUBELLA: 2.12 {index} (ref >0.99)

## 2016-05-28 LAB — ABO/RH: Rh Factor: POSITIVE

## 2016-05-28 LAB — ANTIBODY SCREEN: Antibody Screen: NEGATIVE

## 2016-05-28 LAB — HEPATITIS B SURFACE ANTIGEN: Hepatitis B Surface Ag: NEGATIVE

## 2016-05-29 LAB — URINE CULTURE

## 2016-05-29 LAB — GC/CHLAMYDIA PROBE AMP
CHLAMYDIA, DNA PROBE: NEGATIVE
NEISSERIA GONORRHOEAE BY PCR: NEGATIVE

## 2016-06-11 ENCOUNTER — Telehealth: Payer: Self-pay | Admitting: Obstetrics & Gynecology

## 2016-06-11 ENCOUNTER — Other Ambulatory Visit: Payer: Self-pay | Admitting: Obstetrics & Gynecology

## 2016-06-11 MED ORDER — SULFAMETHOXAZOLE-TRIMETHOPRIM 800-160 MG PO TABS: 1.0000 | ORAL_TABLET | Freq: Two times a day (BID) | ORAL | 0 refills | Status: DC

## 2016-06-11 NOTE — Telephone Encounter (Signed)
Pt called stating that she might have an infection, pt states if the doctor could call her in something for her infection or should she come in and drop off her urine. Pt states that she uses [facility] in [place]  Please contact pt

## 2016-06-11 NOTE — Telephone Encounter (Signed)
Patient called with complaints of possible UTI- frequency, cloudy, pain. She has had them before and it feels the same. She would like to be prescribed something if possible and can leave a urine sample today if we need her too. Please advise.

## 2016-06-11 NOTE — Telephone Encounter (Signed)
Informed patient prescription was sent

## 2016-06-17 ENCOUNTER — Other Ambulatory Visit: Payer: Self-pay | Admitting: Obstetrics & Gynecology

## 2016-06-17 DIAGNOSIS — Z3682 Encounter for antenatal screening for nuchal translucency: Secondary | ICD-10-CM

## 2016-06-18 ENCOUNTER — Ambulatory Visit (INDEPENDENT_AMBULATORY_CARE_PROVIDER_SITE_OTHER): Payer: Medicaid Other | Admitting: Advanced Practice Midwife

## 2016-06-18 ENCOUNTER — Encounter: Payer: Self-pay | Admitting: Advanced Practice Midwife

## 2016-06-18 ENCOUNTER — Ambulatory Visit (INDEPENDENT_AMBULATORY_CARE_PROVIDER_SITE_OTHER): Payer: BLUE CROSS/BLUE SHIELD

## 2016-06-18 VITALS — BP 101/57 | HR 65 | Wt 214.8 lb

## 2016-06-18 DIAGNOSIS — Z3682 Encounter for antenatal screening for nuchal translucency: Secondary | ICD-10-CM | POA: Diagnosis not present

## 2016-06-18 DIAGNOSIS — Z1389 Encounter for screening for other disorder: Secondary | ICD-10-CM

## 2016-06-18 DIAGNOSIS — Z3A13 13 weeks gestation of pregnancy: Secondary | ICD-10-CM | POA: Diagnosis not present

## 2016-06-18 DIAGNOSIS — Z3A12 12 weeks gestation of pregnancy: Secondary | ICD-10-CM | POA: Diagnosis not present

## 2016-06-18 DIAGNOSIS — Z3481 Encounter for supervision of other normal pregnancy, first trimester: Secondary | ICD-10-CM

## 2016-06-18 DIAGNOSIS — O210 Mild hyperemesis gravidarum: Secondary | ICD-10-CM

## 2016-06-18 DIAGNOSIS — O3411 Maternal care for benign tumor of corpus uteri, first trimester: Secondary | ICD-10-CM

## 2016-06-18 DIAGNOSIS — Z331 Pregnant state, incidental: Secondary | ICD-10-CM

## 2016-06-18 DIAGNOSIS — Z124 Encounter for screening for malignant neoplasm of cervix: Secondary | ICD-10-CM

## 2016-06-18 DIAGNOSIS — R51 Headache: Secondary | ICD-10-CM

## 2016-06-18 LAB — POCT URINALYSIS DIPSTICK
GLUCOSE UA: NEGATIVE
KETONES UA: NEGATIVE
Leukocytes, UA: NEGATIVE
Nitrite, UA: NEGATIVE
Protein, UA: NEGATIVE

## 2016-06-18 MED ORDER — DOXYLAMINE-PYRIDOXINE 10-10 MG PO TBEC: DELAYED_RELEASE_TABLET | ORAL | 3 refills | Status: DC

## 2016-06-18 NOTE — Progress Notes (Signed)
[place] 12+2 wks,measurements c/w dates,crl 65.3 mm,NB present,NT 1.4 mm,normal ov's bilat, ant pl gr 0,post fibroid 3 x 2 x 1.7,fhr 152 bpm

## 2016-06-18 NOTE — Addendum Note (Signed)
Addended by: Octaviano Glow on: 06/18/2016 12:04 PM   Modules accepted: Orders

## 2016-06-18 NOTE — Patient Instructions (Addendum)
 First Trimester of Pregnancy The first trimester of pregnancy is from week 1 until the end of week 12 (months 1 through 3). A week after a sperm fertilizes an egg, the egg will implant on the wall of the uterus. This embryo will begin to develop into a baby. Genes from you and your partner are forming the baby. The female genes determine whether the baby is a boy or a girl. At 6-8 weeks, the eyes and face are formed, and the heartbeat can be seen on ultrasound. At the end of 12 weeks, all the baby's organs are formed.  Now that you are pregnant, you will want to do everything you can to have a healthy baby. Two of the most important things are to get good prenatal care and to follow your health care provider's instructions. Prenatal care is all the medical care you receive before the baby's birth. This care will help prevent, find, and treat any problems during the pregnancy and childbirth. BODY CHANGES Your body goes through many changes during pregnancy. The changes vary from woman to woman.   You may gain or lose a couple of pounds at first.  You may feel sick to your stomach (nauseous) and throw up (vomit). If the vomiting is uncontrollable, call your health care provider.  You may tire easily.  You may develop headaches that can be relieved by medicines approved by your health care provider.  You may urinate more often. Painful urination may mean you have a bladder infection.  You may develop heartburn as a result of your pregnancy.  You may develop constipation because certain hormones are causing the muscles that push waste through your intestines to slow down.  You may develop hemorrhoids or swollen, bulging veins (varicose veins).  Your breasts may begin to grow larger and become tender. Your nipples may stick out more, and the tissue that surrounds them (areola) may become darker.  Your gums may bleed and may be sensitive to brushing and flossing.  Dark spots or blotches  (chloasma, mask of pregnancy) may develop on your face. This will likely fade after the baby is born.  Your menstrual periods will stop.  You may have a loss of appetite.  You may develop cravings for certain kinds of food.  You may have changes in your emotions from day to day, such as being excited to be pregnant or being concerned that something may go wrong with the pregnancy and baby.  You may have more vivid and strange dreams.  You may have changes in your hair. These can include thickening of your hair, rapid growth, and changes in texture. Some women also have hair loss during or after pregnancy, or hair that feels dry or thin. Your hair will most likely return to normal after your baby is born. WHAT TO EXPECT AT YOUR PRENATAL VISITS During a routine prenatal visit:  You will be weighed to make sure you and the baby are growing normally.  Your blood pressure will be taken.  Your abdomen will be measured to track your baby's growth.  The fetal heartbeat will be listened to starting around week 10 or 12 of your pregnancy.  Test results from any previous visits will be discussed. Your health care provider may ask you:  How you are feeling.  If you are feeling the baby move.  If you have had any abnormal symptoms, such as leaking fluid, bleeding, severe headaches, or abdominal cramping.  If you have any questions. Other   tests that may be performed during your first trimester include:  Blood tests to find your blood type and to check for the presence of any previous infections. They will also be used to check for low iron levels (anemia) and Rh antibodies. Later in the pregnancy, blood tests for diabetes will be done along with other tests if problems develop.  Urine tests to check for infections, diabetes, or protein in the urine.  An ultrasound to confirm the proper growth and development of the baby.  An amniocentesis to check for possible genetic problems.  Fetal  screens for spina bifida and Down syndrome.  You may need other tests to make sure you and the baby are doing well. HOME CARE INSTRUCTIONS  Medicines  Follow your health care provider's instructions regarding medicine use. Specific medicines may be either safe or unsafe to take during pregnancy.  Take your prenatal vitamins as directed.  If you develop constipation, try taking a stool softener if your health care provider approves. Diet  Eat regular, well-balanced meals. Choose a variety of foods, such as meat or vegetable-based protein, fish, milk and low-fat dairy products, vegetables, fruits, and whole grain breads and cereals. Your health care provider will help you determine the amount of weight gain that is right for you.  Avoid raw meat and uncooked cheese. These carry germs that can cause birth defects in the baby.  Eating four or five small meals rather than three large meals a day may help relieve nausea and vomiting. If you start to feel nauseous, eating a few soda crackers can be helpful. Drinking liquids between meals instead of during meals also seems to help nausea and vomiting.  If you develop constipation, eat more high-fiber foods, such as fresh vegetables or fruit and whole grains. Drink enough fluids to keep your urine clear or pale yellow. Activity and Exercise  Exercise only as directed by your health care provider. Exercising will help you:  Control your weight.  Stay in shape.  Be prepared for labor and delivery.  Experiencing pain or cramping in the lower abdomen or low back is a good sign that you should stop exercising. Check with your health care provider before continuing normal exercises.  Try to avoid standing for long periods of time. Move your legs often if you must stand in one place for a long time.  Avoid heavy lifting.  Wear low-heeled shoes, and practice good posture.  You may continue to have sex unless your health care provider directs you  otherwise. Relief of Pain or Discomfort  Wear a good support bra for breast tenderness.   Take warm sitz baths to soothe any pain or discomfort caused by hemorrhoids. Use hemorrhoid cream if your health care provider approves.   Rest with your legs elevated if you have leg cramps or low back pain.  If you develop varicose veins in your legs, wear support hose. Elevate your feet for 15 minutes, 3-4 times a day. Limit salt in your diet. Prenatal Care  Schedule your prenatal visits by the twelfth week of pregnancy. They are usually scheduled monthly at first, then more often in the last 2 months before delivery.  Write down your questions. Take them to your prenatal visits.  Keep all your prenatal visits as directed by your health care provider. Safety  Wear your seat belt at all times when driving.  Make a list of emergency phone numbers, including numbers for family, friends, the hospital, and police and fire departments. General   Tips  Ask your health care provider for a referral to a local prenatal education class. Begin classes no later than at the beginning of month 6 of your pregnancy.  Ask for help if you have counseling or nutritional needs during pregnancy. Your health care provider can offer advice or refer you to specialists for help with various needs.  Do not use hot tubs, steam rooms, or saunas.  Do not douche or use tampons or scented sanitary pads.  Do not cross your legs for long periods of time.  Avoid cat litter boxes and soil used by cats. These carry germs that can cause birth defects in the baby and possibly loss of the fetus by miscarriage or stillbirth.  Avoid all smoking, herbs, alcohol, and medicines not prescribed by your health care provider. Chemicals in these affect the formation and growth of the baby.  Schedule a dentist appointment. At home, brush your teeth with a soft toothbrush and be gentle when you floss. SEEK MEDICAL CARE IF:   You have  dizziness.  You have mild pelvic cramps, pelvic pressure, or nagging pain in the abdominal area.  You have persistent nausea, vomiting, or diarrhea.  You have a bad smelling vaginal discharge.  You have pain with urination.  You notice increased swelling in your face, hands, legs, or ankles. SEEK IMMEDIATE MEDICAL CARE IF:   You have a fever.  You are leaking fluid from your vagina.  You have spotting or bleeding from your vagina.  You have severe abdominal cramping or pain.  You have rapid weight gain or loss.  You vomit blood or material that looks like coffee grounds.  You are exposed to German measles and have never had them.  You are exposed to fifth disease or chickenpox.  You develop a severe headache.  You have shortness of breath.  You have any kind of trauma, such as from a fall or a car accident. Document Released: 06/02/2001 Document Revised: 10/23/2013 Document Reviewed: 04/18/2013 [employer] Patient Information 2015 [employer]. This information is not intended to replace advice given to you by your health care provider. Make sure you discuss any questions you have with your health care provider.   Nausea & Vomiting  Have saltine crackers or pretzels by your bed and eat a few bites before you raise your head out of bed in the morning  Eat small frequent meals throughout the day instead of large meals  Drink plenty of fluids throughout the day to stay hydrated, just don't drink a lot of fluids with your meals.  This can make your stomach fill up faster making you feel sick  Do not brush your teeth right after you eat  Products with real ginger are good for nausea, like ginger ale and ginger hard candy Make sure it says made with real ginger!  Sucking on sour candy like lemon heads is also good for nausea  If your prenatal vitamins make you nauseated, take them at night so you will sleep through the nausea  Sea Bands  If you feel like you need  medicine for the nausea & vomiting please let us know  If you are unable to keep any fluids or food down please let us know   Constipation  Drink plenty of fluid, preferably water, throughout the day  Eat foods high in fiber such as fruits, vegetables, and grains  Exercise, such as walking, is a good way to keep your bowels regular  Drink warm fluids, especially warm   prune juice, or decaf coffee  Eat a 1/2 cup of real oatmeal (not instant), 1/2 cup applesauce, and 1/2-1 cup warm prune juice every day  If needed, you may take Colace (docusate sodium) stool softener once or twice a day to help keep the stool soft. If you are pregnant, wait until you are out of your first trimester (12-14 weeks of pregnancy)  If you still are having problems with constipation, you may take Miralax once daily as needed to help keep your bowels regular.  If you are pregnant, wait until you are out of your first trimester (12-14 weeks of pregnancy)  Safe Medications in Pregnancy   Acne: Benzoyl Peroxide Salicylic Acid  Backache/Headache: Tylenol: 2 regular strength every 4 hours OR              2 Extra strength every 6 hours  Colds/Coughs/Allergies: Benadryl (alcohol free) 25 mg every 6 hours as needed Breath right strips Claritin Cepacol throat lozenges Chloraseptic throat spray Cold-Eeze- up to three times per day Cough drops, alcohol free Flonase (by prescription only) Guaifenesin Mucinex Robitussin DM (plain only, alcohol free) Saline nasal spray/drops Sudafed (pseudoephedrine) & Actifed ** use only after [redacted] weeks gestation and if you do not have high blood pressure Tylenol Vicks Vaporub Zinc lozenges Zyrtec   Constipation: Colace Ducolax suppositories Fleet enema Glycerin suppositories Metamucil Milk of magnesia Miralax Senokot Smooth move tea  Diarrhea: Kaopectate Imodium A-D  *NO pepto Bismol  Hemorrhoids: Anusol Anusol HC Preparation  H Tucks  Indigestion: Tums Maalox Mylanta Zantac  Pepcid  Insomnia: Benadryl (alcohol free) 25mg  every 6 hours as needed Tylenol PM Unisom, no Gelcaps  Leg Cramps: Tums MagGel  Nausea/Vomiting:  Bonine Dramamine Emetrol Ginger extract Sea bands Meclizine  Nausea medication to take during pregnancy:  Unisom (doxylamine succinate 25 mg tablets) Take one tablet daily at bedtime. If symptoms are not adequately controlled, the dose can be increased to a maximum recommended dose of two tablets daily (1/2 tablet in the morning, 1/2 tablet mid-afternoon and one at bedtime). Vitamin B6 100mg  tablets. Take one tablet twice a day (up to 200 mg per day).  Skin Rashes: Aveeno products Benadryl cream or 25mg  every 6 hours as needed Calamine Lotion 1% cortisone cream  Yeast infection: Gyne-lotrimin 7 Monistat 7   **If taking multiple medications, please check labels to avoid duplicating the same active ingredients **take medication as directed on the label ** Do not exceed 4000 mg of tylenol in 24 hours **Do not take medications that contain aspirin or ibuprofen     For Headaches:   Stay well hydrated, drink enough water so that your urine is clear, sometimes if you are dehydrated you can get headaches  Eat small frequent meals and snacks, sometimes if you are hungry you can get headaches  Sometimes you get headaches during pregnancy from the pregnancy hormones  You can try tylenol (1-2 regular strength 325mg  or 1-2 extra strength 500mg ) as directed on the box. The least amount of medication that works is best.   Cool compresses (cool wet washcloth or ice pack) to area of head that is hurting  You can also try drinking a caffeinated drink to see if this will help  If not helping, try below:  For Prevention of Headaches/Migraines:  CoQ10 100mg  three times daily  Vitamin B2 400mg  daily  Magnesium Oxide 400-600mg  daily  If You Get a Bad  Headache/Migraine:  Benadryl 25mg    Magnesium Oxide  1 large Gatorade  2 extra  strength Tylenol (1,000mg  total)  1 cup coffee or Coke  If this doesn't help please call us @ [phone]

## 2016-06-18 NOTE — Progress Notes (Signed)
Subjective:    Mckenzie Cooley is a [id number] [redacted]w[redacted]d being seen today for her first obstetrical visit.  Her obstetrical history is significant for SAB X3.  Pregnancy history fully reviewed.  Patient reports headache and nausea.  HA is occipital/neck/shoulders.  Requests nausea meds.  No pap since 2011.  Husband has never had another partner, pt's last partner was 2007.  Refusing it until after delivery. Declines flu shot. Risks (death) discussed  Vitals:   25-Jun-2016 1052  BP: (!) 101/57  Pulse: 65  Weight: 214 lb 12.8 oz (97.4 kg)    HISTORY: OB History  Gravida Para Term Preterm AB Living  4 0 0   3 0  SAB TAB Ectopic Multiple Live Births  3 0 0 0 0    # Outcome Date GA Lbr Len/2nd Weight Sex Delivery Anes PTL Lv  4 Current           3 SAB 2014          2 SAB 2012          1 SAB 2008             Past Medical History:  Diagnosis Date  . Acid reflux Dx 2011  . Asthma    As a Child  . Bloating 2011  . PCOS (polycystic ovarian syndrome) Dx 2011  . Weight loss, abnormal    Past Surgical History:  Procedure Laterality Date  . CHOLECYSTECTOMY  2011   Dr.Byerly  . ESOPHAGOGASTRODUODENOSCOPY N/A 10/26/2014   Procedure: ESOPHAGOGASTRODUODENOSCOPY (EGD);  Surgeon: Rogene Houston, MD;  Location: [facility];  Service: Endoscopy;  Laterality: N/A;  . UPPER GASTROINTESTINAL ENDOSCOPY  12/05/2009  . UPPER GASTROINTESTINAL ENDOSCOPY  02/07/2009  . WISDOM TOOTH EXTRACTION    . WISDOM TOOTH EXTRACTION     Family History  Problem Relation Age of Onset  . Hypertension Mother   . Heart disease Father   . Cancer - Cervical Maternal Aunt   . Renal Disease Other   . Kidney disease Maternal Grandmother      Exam                                      System:     Skin: normal coloration and turgor, no rashes    Neurologic: oriented, normal, normal mood   Extremities: normal strength, tone, and muscle mass   HEENT PERRLA   Mouth/Teeth mucous membranes moist, normal  dentition   Neck supple and no masses   Cardiovascular: regular rate and rhythm   Respiratory:  appears well, vitals normal, no respiratory distress, acyanotic   Abdomen: soft, non-tender;  FHR: 152          Assessment:    Pregnancy: [id number] Patient Active Problem List   Diagnosis Date Noted  . Encounter for supervision of other normal pregnancy 05/27/2016  . Constipation 10/27/2014  . Epigastric pain 10/26/2014  . Abdominal pain 10/26/2014  . PCOS (polycystic ovarian syndrome) 09/27/2014  . Abdominal pain, epigastric 09/27/2014  . Smoker 09/27/2014  . Intermittent constipation 09/27/2014  . GERD (gastroesophageal reflux disease) 07/01/2011  . Asthma 07/01/2011  . Gastroesophageal reflux disease 07/01/2011        Plan:     Initial labs drawn. Continue prenatal vitamins  Problem list reviewed and updated  Reviewed n/v relief measures and warning s/s to report  rx diclegis Reviewed recommended weight gain based on pre-gravid  BMI  Encouraged well-balanced diet Genetic Screening discussed Integrated Screen: results reviewed.  Ultrasound discussed; fetal survey: requested.  Return in about 4 weeks (around 07/16/2016) for 2nd IT, LROB.  CRESENZO-DISHMAN,Kacey Vicuna 06/18/2016

## 2016-06-20 LAB — MATERNAL SCREEN, INTEGRATED #1
Crown Rump Length: 65.3 mm
GEST. AGE ON COLLECTION DATE: 12.7 wk
MATERNAL AGE AT EDD: 26.5 a
NUMBER OF FETUSES: 1
Nuchal Translucency (NT): 1.4 mm
PAPP-A VALUE: 620.5 ng/mL
WEIGHT: 215 [lb_av]

## 2016-06-24 ENCOUNTER — Telehealth: Payer: Self-pay | Admitting: Advanced Practice Midwife

## 2016-06-24 DIAGNOSIS — J018 Other acute sinusitis: Secondary | ICD-10-CM | POA: Diagnosis not present

## 2016-06-24 NOTE — Telephone Encounter (Signed)
Pt states went to Urgent Care and was given Keflex for sinus infection and wanted to make sure it was safe to take during pregnancy.  Pt informed it is a category B and is OK for pregnancy.

## 2016-06-24 NOTE — Telephone Encounter (Signed)
Pt called stating that she went to an Urgent care because she has a sinus infection and they gave her some medication and would like to know if it is okay to take while preg. Please contact pt

## 2016-07-16 ENCOUNTER — Encounter: Payer: Self-pay | Admitting: Women's Health

## 2016-07-16 ENCOUNTER — Ambulatory Visit (INDEPENDENT_AMBULATORY_CARE_PROVIDER_SITE_OTHER): Payer: BLUE CROSS/BLUE SHIELD | Admitting: Women's Health

## 2016-07-16 VITALS — BP 138/58 | HR 88 | Wt 217.0 lb

## 2016-07-16 DIAGNOSIS — Z363 Encounter for antenatal screening for malformations: Secondary | ICD-10-CM

## 2016-07-16 DIAGNOSIS — Z331 Pregnant state, incidental: Secondary | ICD-10-CM

## 2016-07-16 DIAGNOSIS — Z1389 Encounter for screening for other disorder: Secondary | ICD-10-CM

## 2016-07-16 DIAGNOSIS — O99212 Obesity complicating pregnancy, second trimester: Secondary | ICD-10-CM

## 2016-07-16 DIAGNOSIS — Z3682 Encounter for antenatal screening for nuchal translucency: Secondary | ICD-10-CM

## 2016-07-16 DIAGNOSIS — Z3A16 16 weeks gestation of pregnancy: Secondary | ICD-10-CM

## 2016-07-16 DIAGNOSIS — Z3482 Encounter for supervision of other normal pregnancy, second trimester: Secondary | ICD-10-CM

## 2016-07-16 LAB — POCT URINALYSIS DIPSTICK
Blood, UA: NEGATIVE
GLUCOSE UA: NEGATIVE
Ketones, UA: NEGATIVE
NITRITE UA: NEGATIVE
PROTEIN UA: NEGATIVE

## 2016-07-16 NOTE — Patient Instructions (Signed)
Second Trimester of Pregnancy The second trimester is from week 13 through week 28 (months 4 through 6). The second trimester is often a time when you feel your best. Your body has also adjusted to being pregnant, and you begin to feel better physically. Usually, morning sickness has lessened or quit completely, you may have more energy, and you may have an increase in appetite. The second trimester is also a time when the fetus is growing rapidly. At the end of the sixth month, the fetus is about 9 inches long and weighs about 1 pounds. You will likely begin to feel the baby move (quickening) between 18 and 20 weeks of the pregnancy. Body changes during your second trimester Your body continues to go through many changes during your second trimester. The changes vary from woman to woman.  Your weight will continue to increase. You will notice your lower abdomen bulging out.  You may begin to get stretch marks on your hips, abdomen, and breasts.  You may develop headaches that can be relieved by medicines. The medicines should be approved by your health care provider.  You may urinate more often because the fetus is pressing on your bladder.  You may develop or continue to have heartburn as a result of your pregnancy.  You may develop constipation because certain hormones are causing the muscles that push waste through your intestines to slow down.  You may develop hemorrhoids or swollen, bulging veins (varicose veins).  You may have back pain. This is caused by:  Weight gain.  Pregnancy hormones that are relaxing the joints in your pelvis.  A shift in weight and the muscles that support your balance.  Your breasts will continue to grow and they will continue to become tender.  Your gums may bleed and may be sensitive to brushing and flossing.  Dark spots or blotches (chloasma, mask of pregnancy) may develop on your face. This will likely fade after the baby is born.  A dark line  from your belly button to the pubic area (linea nigra) may appear. This will likely fade after the baby is born.  You may have changes in your hair. These can include thickening of your hair, rapid growth, and changes in texture. Some women also have hair loss during or after pregnancy, or hair that feels dry or thin. Your hair will most likely return to normal after your baby is born. What to expect at prenatal visits During a routine prenatal visit:  You will be weighed to make sure you and the fetus are growing normally.  Your blood pressure will be taken.  Your abdomen will be measured to track your baby's growth.  The fetal heartbeat will be listened to.  Any test results from the previous visit will be discussed. Your health care provider may ask you:  How you are feeling.  If you are feeling the baby move.  If you have had any abnormal symptoms, such as leaking fluid, bleeding, severe headaches, or abdominal cramping.  If you are using any tobacco products, including cigarettes, chewing tobacco, and electronic cigarettes.  If you have any questions. Other tests that may be performed during your second trimester include:  Blood tests that check for:  Low iron levels (anemia).  Gestational diabetes (between 24 and 28 weeks).  Rh antibodies. This is to check for a protein on red blood cells (Rh factor).  Urine tests to check for infections, diabetes, or protein in the urine.  An ultrasound to  confirm the proper growth and development of the baby.  An amniocentesis to check for possible genetic problems.  Fetal screens for spina bifida and Down syndrome.  HIV (human immunodeficiency virus) testing. Routine prenatal testing includes screening for HIV, unless you choose not to have this test. Follow these instructions at home: Eating and drinking  Continue to eat regular, healthy meals.  Avoid raw meat, uncooked cheese, cat litter boxes, and soil used by cats. These  carry germs that can cause birth defects in the baby.  Take your prenatal vitamins.  Take 1500-2000 mg of calcium daily starting at the 20th week of pregnancy until you deliver your baby.  If you develop constipation:  Take over-the-counter or prescription medicines.  Drink enough fluid to keep your urine clear or pale yellow.  Eat foods that are high in fiber, such as fresh fruits and vegetables, whole grains, and beans.  Limit foods that are high in fat and processed sugars, such as fried and sweet foods. Activity  Exercise only as directed by your health care provider. Experiencing uterine cramps is a good sign to stop exercising.  Avoid heavy lifting, wear low heel shoes, and practice good posture.  Wear your seat belt at all times when driving.  Rest with your legs elevated if you have leg cramps or low back pain.  Wear a good support bra for breast tenderness.  Do not use hot tubs, steam rooms, or saunas. Lifestyle  Avoid all smoking, herbs, alcohol, and unprescribed drugs. These chemicals affect the formation and growth of the baby.  Do not use any products that contain nicotine or tobacco, such as cigarettes and e-cigarettes. If you need help quitting, ask your health care provider.  A sexual relationship may be continued unless your health care provider directs you otherwise. General instructions  Follow your health care provider's instructions regarding medicine use. There are medicines that are either safe or unsafe to take during pregnancy.  Take warm sitz baths to soothe any pain or discomfort caused by hemorrhoids. Use hemorrhoid cream if your health care provider approves.  If you develop varicose veins, wear support hose. Elevate your feet for 15 minutes, 3-4 times a day. Limit salt in your diet.  Visit your dentist if you have not gone yet during your pregnancy. Use a soft toothbrush to brush your teeth and be gentle when you floss.  Keep all follow-up  prenatal visits as told by your health care provider. This is important. Contact a health care provider if:  You have dizziness.  You have mild pelvic cramps, pelvic pressure, or nagging pain in the abdominal area.  You have persistent nausea, vomiting, or diarrhea.  You have a bad smelling vaginal discharge.  You have pain with urination. Get help right away if:  You have a fever.  You are leaking fluid from your vagina.  You have spotting or bleeding from your vagina.  You have severe abdominal cramping or pain.  You have rapid weight gain or weight loss.  You have shortness of breath with chest pain.  You notice sudden or extreme swelling of your face, hands, ankles, feet, or legs.  You have not felt your baby move in over an hour.  You have severe headaches that do not go away with medicine.  You have vision changes. Summary  The second trimester is from week 13 through week 28 (months 4 through 6). It is also a time when the fetus is growing rapidly.  Your body goes  through many changes during pregnancy. The changes vary from woman to woman.  Avoid all smoking, herbs, alcohol, and unprescribed drugs. These chemicals affect the formation and growth your baby.  Do not use any tobacco products, such as cigarettes, chewing tobacco, and e-cigarettes. If you need help quitting, ask your health care provider.  Contact your health care provider if you have any questions. Keep all prenatal visits as told by your health care provider. This is important. This information is not intended to replace advice given to you by your health care provider. Make sure you discuss any questions you have with your health care provider. Document Released: 06/02/2001 Document Revised: 11/14/2015 Document Reviewed: 08/09/2012 Elsevier Interactive Patient Education  2017 [employer].

## 2016-07-16 NOTE — Progress Notes (Signed)
Low-risk OB appointment [id number] [redacted]w[redacted]d Estimated Date of Delivery: 12/29/16 BP (!) 138/58   Pulse 88   Wt 217 lb (98.4 kg)   LMP 01/25/2016 (LMP Unknown)   BMI 42.38 kg/m   BP, weight, and urine reviewed.  Refer to obstetrical flow sheet for FH & FHR.  No fm yet. Denies cramping, lof, vb, or uti s/s. Pinching pain mid lower belly Reviewed warning s/s to report. Plan:  Continue routine obstetrical care  F/U in 4wks for OB appointment and anatomy u/s 2nd IT today

## 2016-07-22 LAB — MATERNAL SCREEN, INTEGRATED #2
ADSF: 1.35
AFP MoM: 1.75
Alpha-Fetoprotein: 45 ng/mL
Crown Rump Length: 65.3 mm
DIA MoM: 1.16
DIA Value: 162.7 pg/mL
Estriol, Unconjugated: 1.15 ng/mL
GEST. AGE ON COLLECTION DATE: 12.7 wk
Gestational Age: 16.7 weeks
HCG VALUE: 15.1 [IU]/mL
MATERNAL AGE AT EDD: 26.5 a
Nuchal Translucency (NT): 1.4 mm
Nuchal Translucency MoM: 0.86
Number of Fetuses: 1
PAPP-A MoM: 0.93
PAPP-A VALUE: 620.5 ng/mL
Test Results:: NEGATIVE
WEIGHT: 215 [lb_av]
Weight: 215 [lb_av]
hCG MoM: 0.65

## 2016-08-03 ENCOUNTER — Telehealth: Payer: Self-pay | Admitting: Advanced Practice Midwife

## 2016-08-03 ENCOUNTER — Encounter: Payer: Self-pay | Admitting: *Deleted

## 2016-08-03 ENCOUNTER — Telehealth: Payer: Self-pay | Admitting: *Deleted

## 2016-08-03 NOTE — Telephone Encounter (Signed)
Patient called stating she was having numbness and tingling in her hands, especially at night. I informed patient that carpel tunnel can be very common during pregnancy and can be exacerbated if already present before pregnancy. Encouraged patient to try wrist splints or braces to see if that would help at all. Pt verbalized understanding.

## 2016-08-06 ENCOUNTER — Telehealth: Payer: Self-pay | Admitting: *Deleted

## 2016-08-06 ENCOUNTER — Encounter: Payer: Self-pay | Admitting: *Deleted

## 2016-08-11 NOTE — Telephone Encounter (Signed)
Opened in error

## 2016-08-13 ENCOUNTER — Ambulatory Visit (INDEPENDENT_AMBULATORY_CARE_PROVIDER_SITE_OTHER): Payer: BLUE CROSS/BLUE SHIELD

## 2016-08-13 ENCOUNTER — Encounter: Payer: Self-pay | Admitting: Obstetrics & Gynecology

## 2016-08-13 ENCOUNTER — Ambulatory Visit (INDEPENDENT_AMBULATORY_CARE_PROVIDER_SITE_OTHER): Payer: BLUE CROSS/BLUE SHIELD | Admitting: Obstetrics & Gynecology

## 2016-08-13 VITALS — BP 100/50 | HR 78 | Wt 223.0 lb

## 2016-08-13 DIAGNOSIS — O3412 Maternal care for benign tumor of corpus uteri, second trimester: Secondary | ICD-10-CM | POA: Diagnosis not present

## 2016-08-13 DIAGNOSIS — Z3A20 20 weeks gestation of pregnancy: Secondary | ICD-10-CM

## 2016-08-13 DIAGNOSIS — Z3482 Encounter for supervision of other normal pregnancy, second trimester: Secondary | ICD-10-CM

## 2016-08-13 DIAGNOSIS — R350 Frequency of micturition: Secondary | ICD-10-CM

## 2016-08-13 DIAGNOSIS — Z363 Encounter for antenatal screening for malformations: Secondary | ICD-10-CM | POA: Diagnosis not present

## 2016-08-13 DIAGNOSIS — Z331 Pregnant state, incidental: Secondary | ICD-10-CM

## 2016-08-13 DIAGNOSIS — Z1389 Encounter for screening for other disorder: Secondary | ICD-10-CM

## 2016-08-13 LAB — POCT URINALYSIS DIPSTICK
Blood, UA: NEGATIVE
GLUCOSE UA: NEGATIVE
Ketones, UA: NEGATIVE
LEUKOCYTES UA: NEGATIVE
NITRITE UA: NEGATIVE
Protein, UA: NEGATIVE

## 2016-08-13 NOTE — Addendum Note (Signed)
Addended by: Diona Fanti A on: 08/13/2016 11:41 AM   Modules accepted: Orders

## 2016-08-13 NOTE — Progress Notes (Signed)
[id number] [redacted]w[redacted]d Estimated Date of Delivery: 12/29/16  Blood pressure (!) 100/50, pulse 78, weight 223 lb (101.2 kg), last menstrual period 01/25/2016.   BP weight and urine results all reviewed and noted.  Please refer to the obstetrical flow sheet for the fundal height and fetal heart rate documentation:  Patient reports good fetal movement, denies any bleeding and no rupture of membranes symptoms or regular contractions. Patient is without complaints. All questions were answered.  Orders Placed This Encounter  Procedures  . POCT urinalysis dipstick    Plan:  Continued routine obstetrical care, sonogram is normal, no abnormalities  Severe carpal tunnel at 20 weeks, will refer to Dr Aline Brochure for evaluation of steroid injections, she is wearing the carpal tunnel braces day and night with no relief  Return in about 4 weeks (around 09/10/2016) for LROB.

## 2016-08-13 NOTE — Progress Notes (Signed)
[place] [id number] wks,cephalic,ant pl gr 0,cx 4 cm,normal ov's bilat,svp of fluid 5.6 cm,fhr 154 bpm,post fibroid n/c,efw 372 g,anatomy complete,no obvious abnormalities seen

## 2016-08-15 LAB — URINE CULTURE

## 2016-08-26 ENCOUNTER — Encounter: Payer: Self-pay | Admitting: Obstetrics & Gynecology

## 2016-09-08 ENCOUNTER — Other Ambulatory Visit: Payer: Self-pay | Admitting: *Deleted

## 2016-09-08 DIAGNOSIS — G5600 Carpal tunnel syndrome, unspecified upper limb: Secondary | ICD-10-CM

## 2016-09-08 DIAGNOSIS — O26899 Other specified pregnancy related conditions, unspecified trimester: Principal | ICD-10-CM

## 2016-09-10 ENCOUNTER — Encounter: Payer: BLUE CROSS/BLUE SHIELD | Admitting: Women's Health

## 2016-09-14 ENCOUNTER — Encounter: Payer: Self-pay | Admitting: Orthopedic Surgery

## 2016-09-14 ENCOUNTER — Ambulatory Visit (INDEPENDENT_AMBULATORY_CARE_PROVIDER_SITE_OTHER): Payer: BLUE CROSS/BLUE SHIELD | Admitting: Women's Health

## 2016-09-14 ENCOUNTER — Encounter: Payer: Self-pay | Admitting: Women's Health

## 2016-09-14 ENCOUNTER — Ambulatory Visit (INDEPENDENT_AMBULATORY_CARE_PROVIDER_SITE_OTHER): Payer: BLUE CROSS/BLUE SHIELD | Admitting: Orthopedic Surgery

## 2016-09-14 VITALS — BP 111/51 | HR 78 | Ht 60.25 in | Wt 231.0 lb

## 2016-09-14 VITALS — BP 130/64 | HR 68 | Wt 230.0 lb

## 2016-09-14 DIAGNOSIS — Z1389 Encounter for screening for other disorder: Secondary | ICD-10-CM

## 2016-09-14 DIAGNOSIS — G5603 Carpal tunnel syndrome, bilateral upper limbs: Secondary | ICD-10-CM

## 2016-09-14 DIAGNOSIS — Z3482 Encounter for supervision of other normal pregnancy, second trimester: Secondary | ICD-10-CM

## 2016-09-14 DIAGNOSIS — Z331 Pregnant state, incidental: Secondary | ICD-10-CM

## 2016-09-14 LAB — POCT URINALYSIS DIPSTICK
GLUCOSE UA: NEGATIVE
Ketones, UA: NEGATIVE
Leukocytes, UA: NEGATIVE
NITRITE UA: NEGATIVE
Protein, UA: NEGATIVE
RBC UA: NEGATIVE

## 2016-09-14 NOTE — Progress Notes (Signed)
Patient ID: Mckenzie Cooley, female   DOB: 1990/12/12, 26 y.o.   MRN: [id number]  Chief Complaint  Patient presents with  . Carpal Tunnel    bilateral    HPI Mckenzie Cooley is a 26 y.o. female.  Dr Elonda Husky referral for carpal tunnel syndrome of pregnancy   She complains of numbness and tingling in both right and left hand now for several months. She tried full-time and part-time bracing did not improve. She is now dropping things especially in the right hand. She is 25 weeks into the pregnancy  Review of Systems Review of Systems  Constitutional: Negative for fever.  Skin: Negative for rash.     Past Medical History:  Diagnosis Date  . Acid reflux Dx 2011  . Asthma    As a Child  . Bloating 2011  . PCOS (polycystic ovarian syndrome) Dx 2011  . Weight loss, abnormal     Past Surgical History:  Procedure Laterality Date  . CHOLECYSTECTOMY  2011   Dr.Byerly  . ESOPHAGOGASTRODUODENOSCOPY N/A 10/26/2014   Procedure: ESOPHAGOGASTRODUODENOSCOPY (EGD);  Surgeon: Rogene Houston, MD;  Location: [facility];  Service: Endoscopy;  Laterality: N/A;  . UPPER GASTROINTESTINAL ENDOSCOPY  12/05/2009  . UPPER GASTROINTESTINAL ENDOSCOPY  02/07/2009  . WISDOM TOOTH EXTRACTION    . WISDOM TOOTH EXTRACTION      Family History  Problem Relation Age of Onset  . Hypertension Mother   . Heart disease Father   . Cancer - Cervical Maternal Aunt   . Renal Disease Other   . Kidney disease Maternal Grandmother     Social History Social History  Substance Use Topics  . Smoking status: Former Smoker    Packs/day: 0.50    Years: 12.00    Types: Cigarettes  . Smokeless tobacco: Never Used  . Alcohol use No    Allergies  Allergen Reactions  . Percocet [Oxycodone-Acetaminophen] Hives and Itching  . Amoxicillin Rash    Current Outpatient Prescriptions  Medication Sig Dispense Refill  . Bisacodyl (DULCOLAX PO) Take by mouth daily.    Marland Kitchen docusate sodium (COLACE) 100 MG capsule Take 100 mg by  mouth 2 (two) times daily.    . Doxylamine-Pyridoxine 10-10 MG TBEC 2 PO qhs; may take 1po in am and 1po in afternoon prn nausea 120 tablet 3  . magnesium hydroxide (MILK OF MAGNESIA) 400 MG/5ML suspension Take by mouth daily as needed for mild constipation.    Riley Nearing w/o A FeCbnFeGlu-FA &B6 (CITRANATAL B-CALM) 20-1 & 25 (2) MG MISC Take daily 30 each 11   No current facility-administered medications for this visit.      Physical Exam Blood pressure (!) 111/51, pulse 78, height 5' 0.25" (1.53 m), weight 231 lb (104.8 kg), last menstrual period 01/25/2016. Physical Exam The patient is well developed well nourished and well groomed.  Orientation to person place and time is normal  Mood is pleasant.  Ambulatory status normal   Right and Left Upper extremity examination reveals the following:  Inspection reveals mild bilateral  swelling. There is mild tenderness over the carpal tunnel of the right and left hand   Range of motion of the wrist and elbow are normal in both extremities   Motor exam shows mild weakness with grip strength right worse than left   Wrist joint is stable bilterally   Provocative tests for carpal tunnel Phalen's test  + each side  Carpal tunnel compression test + each side   Pulses are normal in  the radial and ulnar artery with a normal Allen's test in each hand and rest   Decreased sensation is noted in the median nerve distribution in the right and left hand Soft touch is normal in each hand    Data Reviewed  I called Dr. Elonda Husky the gynecologist and he assured [place] that carpal tunnel injection was safe Assessment    Bilateral CTS    Plan    Continue Wrist splint Plus injection right wrist, but the patient has declined at this time  She will follow-up with [place] as needed

## 2016-09-14 NOTE — Patient Instructions (Addendum)
You will have your sugar test next visit.  Please do not eat or drink anything after midnight the night before you come, not even water.  You will be here for at least two hours.     Call the office [phone]) or go to [facility] if:  You begin to have strong, frequent contractions  Your water breaks.  Sometimes it is a big gush of fluid, sometimes it is just a trickle that keeps getting your panties wet or running down your legs  You have vaginal bleeding.  It is normal to have a small amount of spotting if your cervix was checked.   You don't feel your baby moving like normal.  If you don't, get you something to eat and drink and lay down and focus on feeling your baby move.   If your baby is still not moving like normal, you should call the office or go to [place] Pediatricians/Family Doctors:  [facility] [place] [phone]                 [place] [phone] (usually not accepting new patients unless you have family there already, you are always welcome to call and ask)            [facility] Pediatricians/Family Doctors:   [place]: [phone]  [facility]: [phone]    Second Trimester of Pregnancy The second trimester is from week 13 through week 28, months 4 through 6. The second trimester is often a time when you feel your best. Your body has also adjusted to being pregnant, and you begin to feel better physically. Usually, morning sickness has lessened or quit completely, you may have more energy, and you may have an increase in appetite. The second trimester is also a time when the fetus is growing rapidly. At the end of the sixth month, the fetus is about 9 inches long and weighs about 1 pounds. You will likely begin to feel the baby move (quickening) between 18 and 20 weeks of the pregnancy. BODY CHANGES Your body goes through many  changes during pregnancy. The changes vary from woman to woman.   Your weight will continue to increase. You will notice your lower abdomen bulging out.  You may begin to get stretch marks on your hips, abdomen, and breasts.  You may develop headaches that can be relieved by medicines approved by your health care provider.  You may urinate more often because the fetus is pressing on your bladder.  You may develop or continue to have heartburn as a result of your pregnancy.  You may develop constipation because certain hormones are causing the muscles that push waste through your intestines to slow down.  You may develop hemorrhoids or swollen, bulging veins (varicose veins).  You may have back pain because of the weight gain and pregnancy hormones relaxing your joints between the bones in your pelvis and as a result of a shift in weight and the muscles that support your balance.  Your breasts will continue to grow and be tender.  Your gums may bleed and may be sensitive to brushing and flossing.  Dark spots or blotches (chloasma, mask of pregnancy) may develop on your face. This will likely fade after the baby is born.  A dark line from your belly button to the pubic area (linea nigra) may appear. This will likely fade after the baby  is born.  You may have changes in your hair. These can include thickening of your hair, rapid growth, and changes in texture. Some women also have hair loss during or after pregnancy, or hair that feels dry or thin. Your hair will most likely return to normal after your baby is born. WHAT TO EXPECT AT YOUR PRENATAL VISITS During a routine prenatal visit:  You will be weighed to make sure you and the fetus are growing normally.  Your blood pressure will be taken.  Your abdomen will be measured to track your baby's growth.  The fetal heartbeat will be listened to.  Any test results from the previous visit will be discussed. Your health care provider  may ask you:  How you are feeling.  If you are feeling the baby move.  If you have had any abnormal symptoms, such as leaking fluid, bleeding, severe headaches, or abdominal cramping.  If you have any questions. Other tests that may be performed during your second trimester include:  Blood tests that check for:  Low iron levels (anemia).  Gestational diabetes (between 24 and 28 weeks).  Rh antibodies.  Urine tests to check for infections, diabetes, or protein in the urine.  An ultrasound to confirm the proper growth and development of the baby.  An amniocentesis to check for possible genetic problems.  Fetal screens for spina bifida and Down syndrome. HOME CARE INSTRUCTIONS   Avoid all smoking, herbs, alcohol, and unprescribed drugs. These chemicals affect the formation and growth of the baby.  Follow your health care provider's instructions regarding medicine use. There are medicines that are either safe or unsafe to take during pregnancy.  Exercise only as directed by your health care provider. Experiencing uterine cramps is a good sign to stop exercising.  Continue to eat regular, healthy meals.  Wear a good support bra for breast tenderness.  Do not use hot tubs, steam rooms, or saunas.  Wear your seat belt at all times when driving.  Avoid raw meat, uncooked cheese, cat litter boxes, and soil used by cats. These carry germs that can cause birth defects in the baby.  Take your prenatal vitamins.  Try taking a stool softener (if your health care provider approves) if you develop constipation. Eat more high-fiber foods, such as fresh vegetables or fruit and whole grains. Drink plenty of fluids to keep your urine clear or pale yellow.  Take warm sitz baths to soothe any pain or discomfort caused by hemorrhoids. Use hemorrhoid cream if your health care provider approves.  If you develop varicose veins, wear support hose. Elevate your feet for 15 minutes, 3-4 times a  day. Limit salt in your diet.  Avoid heavy lifting, wear low heel shoes, and practice good posture.  Rest with your legs elevated if you have leg cramps or low back pain.  Visit your dentist if you have not gone yet during your pregnancy. Use a soft toothbrush to brush your teeth and be gentle when you floss.  A sexual relationship may be continued unless your health care provider directs you otherwise.  Continue to go to all your prenatal visits as directed by your health care provider. SEEK MEDICAL CARE IF:   You have dizziness.  You have mild pelvic cramps, pelvic pressure, or nagging pain in the abdominal area.  You have persistent nausea, vomiting, or diarrhea.  You have a bad smelling vaginal discharge.  You have pain with urination. SEEK IMMEDIATE MEDICAL CARE IF:   You have  a fever.  You are leaking fluid from your vagina.  You have spotting or bleeding from your vagina.  You have severe abdominal cramping or pain.  You have rapid weight gain or loss.  You have shortness of breath with chest pain.  You notice sudden or extreme swelling of your face, hands, ankles, feet, or legs.  You have not felt your baby move in over an hour.  You have severe headaches that do not go away with medicine.  You have vision changes. Document Released: 06/02/2001 Document Revised: 06/13/2013 Document Reviewed: 08/09/2012 [facility] Patient Information 2015 [place], [place]. This information is not intended to replace advice given to you by your health care provider. Make sure you discuss any questions you have with your health care provider.

## 2016-09-14 NOTE — Patient Instructions (Addendum)
Carpal Tunnel Release Carpal tunnel release is a surgical procedure to relieve numbness and pain in your hand that are caused by carpal tunnel syndrome. Your carpal tunnel is a narrow, hollow space in your wrist. It passes between your wrist bones and a band of connective tissue (transverse carpal ligament). The nerve that supplies most of your hand (median nerve) passes through this space, and so do the connections between your fingers and the muscles of your arm (tendons). Carpal tunnel syndrome makes this space swell and become narrow, and this causes pain and numbness. In carpal tunnel release surgery, a surgeon cuts through the transverse carpal ligament to make more room in the carpal tunnel space. You may have this surgery if other types of treatment have not worked. Tell a health care provider about:  Any allergies you have.  All medicines you are taking, including vitamins, herbs, eye drops, creams, and over-the-counter medicines.  Any problems you or family members have had with anesthetic medicines.  Any blood disorders you have.  Any surgeries you have had.  Any medical conditions you have. What are the risks? Generally, this is a safe procedure. However, problems may occur, including:  Bleeding.  Infection.  Injury to the median nerve.  Need for additional surgery.  What happens before the procedure?  Ask your health care provider about: ? Changing or stopping your regular medicines. This is especially important if you are taking diabetes medicines or blood thinners. ? Taking medicines such as aspirin and ibuprofen. These medicines can thin your blood. Do not take these medicines before your procedure if your health care provider instructs you not to.  Do not eat or drink anything after midnight on the night before the procedure or as directed by your health care provider.  Plan to have someone take you home after the procedure. What happens during the  procedure?  An IV tube may be inserted into a vein.  You will be given one of the following: ? A medicine that numbs the wrist area (local anesthetic). You may also be given a medicine to make you relax (sedative). ? A medicine that makes you go to sleep (general anesthetic).  Your arm, hand, and wrist will be cleaned with a germ-killing solution (antiseptic).  Your surgeon will make a surgical cut (incision) over the palm side of your wrist. The surgeon will pull aside the skin of your wrist to expose the carpal tunnel space.  The surgeon will cut the transverse carpal ligament.  The edges of the incision will be closed with stitches (sutures) or staples.  A bandage (dressing) will be placed over your wrist and wrapped around your hand and wrist. What happens after the procedure?  You may spend some time in a recovery area.  Your blood pressure, heart rate, breathing rate, and blood oxygen level will be monitored often until the medicines you were given have worn off.  You will likely have some pain. You will be given pain medicine.  You may need to wear a splint or a wrist brace over your dressing. This information is not intended to replace advice given to you by your health care provider. Make sure you discuss any questions you have with your health care provider. Document Released: 08/29/2003 Document Revised: 11/14/2015 Document Reviewed: 01/24/2014 Elsevier Interactive Patient Education  2017 [employer]  

## 2016-09-14 NOTE — Progress Notes (Signed)
Low-risk OB appointment [id number] [redacted]w[redacted]d Estimated Date of Delivery: 12/29/16 BP 130/64   Pulse 68   Wt 230 lb (104.3 kg)   LMP 01/25/2016 (LMP Unknown)   BMI 44.55 kg/m   BP, weight, and urine reviewed.  Refer to obstetrical flow sheet for FH & FHR.  Reports good fm.  Denies regular uc's, lof, vb, or uti s/s. Reflux, zantac helps. Cut back hours at work to 9-10hrs d/t contractions, back pain, etc. Discussed lbp relief measures.  Metallic taste in mouth, no recent change in meds/food, etc.  Reviewed ptl s/s, fm. Plan:  Continue routine obstetrical care  F/U in 3wks for OB appointment and pn2

## 2016-09-21 ENCOUNTER — Telehealth: Payer: Self-pay | Admitting: *Deleted

## 2016-09-21 ENCOUNTER — Encounter: Payer: Self-pay | Admitting: Obstetrics & Gynecology

## 2016-09-21 ENCOUNTER — Ambulatory Visit (INDEPENDENT_AMBULATORY_CARE_PROVIDER_SITE_OTHER): Payer: BLUE CROSS/BLUE SHIELD | Admitting: Obstetrics & Gynecology

## 2016-09-21 VITALS — BP 120/64 | HR 84 | Wt 232.0 lb

## 2016-09-21 DIAGNOSIS — Z331 Pregnant state, incidental: Secondary | ICD-10-CM

## 2016-09-21 DIAGNOSIS — K219 Gastro-esophageal reflux disease without esophagitis: Secondary | ICD-10-CM

## 2016-09-21 DIAGNOSIS — Z3482 Encounter for supervision of other normal pregnancy, second trimester: Secondary | ICD-10-CM

## 2016-09-21 DIAGNOSIS — R1013 Epigastric pain: Secondary | ICD-10-CM

## 2016-09-21 DIAGNOSIS — Z1389 Encounter for screening for other disorder: Secondary | ICD-10-CM

## 2016-09-21 LAB — POCT URINALYSIS DIPSTICK
Blood, UA: NEGATIVE
Glucose, UA: NEGATIVE
Ketones, UA: NEGATIVE
NITRITE UA: NEGATIVE
PROTEIN UA: NEGATIVE

## 2016-09-21 MED ORDER — PANTOPRAZOLE SODIUM 20 MG PO TBEC: 20.0000 mg | DELAYED_RELEASE_TABLET | Freq: Every day | ORAL | 11 refills | Status: DC

## 2016-09-21 NOTE — Progress Notes (Signed)
Work in Pharmacist, hospital Complaint  Patient presents with  . Routine Prenatal Visit    Blood pressure 120/64, pulse 84, weight 232 lb (105.2 kg), last menstrual period 01/25/2016.  26 y.o. G4P0030 Patient's last menstrual period was 01/25/2016 (lmp unknown). The current method of family planning is prgnant.  Outpatient Encounter Prescriptions as of 09/21/2016  Medication Sig  . docusate sodium (COLACE) 100 MG capsule Take 100 mg by mouth 2 (two) times daily.  . Doxylamine-Pyridoxine 10-10 MG TBEC 2 PO qhs; may take 1po in am and 1po in afternoon prn nausea  . ranitidine (ZANTAC) 150 MG tablet Take 150 mg by mouth 2 (two) times daily.  . Bisacodyl (DULCOLAX PO) Take by mouth daily.  . magnesium hydroxide (MILK OF MAGNESIA) 400 MG/5ML suspension Take by mouth daily as needed for mild constipation.  . pantoprazole (PROTONIX) 20 MG tablet Take 1 tablet (20 mg total) by mouth daily.  Riley Nearing w/o A FeCbnFeGlu-FA &B6 (CITRANATAL B-CALM) 20-1 & 25 (2) MG MISC Take daily (Patient not taking: Reported on 09/14/2016)   No facility-administered encounter medications on file as of 09/21/2016.     Subjective Pt presents reporting mid epigastric pain, no RUQ pain\ No emesis but nausea  Objective Abdomen soft non tender FH 28 cm FHR 155  Pertinent ROS No burning with urination, frequency or urgency No nausea, vomiting or diarrhea Nor fever chills or other constitutional symptoms   Labs or studies     Impression Diagnoses this Encounter::   ICD-9-CM ICD-10-CM   1. Gastroesophageal reflux disease without esophagitis 530.81 K21.9   2. Encounter for supervision of other normal pregnancy in second trimester V22.1 Z34.82   3. Pregnant state, incidental V22.2 Z33.1 POCT urinalysis dipstick  4. Screening for genitourinary condition V81.6 Z13.89 POCT urinalysis dipstick  5. Acute epigastric pain 789.06 R10.13    338.19      Established relevant  diagnosis(es):   Plan/Recommendations: Meds ordered this encounter  Medications  . pantoprazole (PROTONIX) 20 MG tablet    Sig: Take 1 tablet (20 mg total) by mouth daily.    Dispense:  30 tablet    Refill:  11    Labs or Scans Ordered: Orders Placed This Encounter  Procedures  . POCT urinalysis dipstick    Management:: Switch to protonix from zantac  Follow up Return for kkep appt as scheduled.         All questions were answered.  Past Medical History:  Diagnosis Date  . Acid reflux Dx 2011  . Asthma    As a Child  . Bloating 2011  . PCOS (polycystic ovarian syndrome) Dx 2011  . Weight loss, abnormal     Past Surgical History:  Procedure Laterality Date  . CHOLECYSTECTOMY  2011   Dr.Byerly  . ESOPHAGOGASTRODUODENOSCOPY N/A 10/26/2014   Procedure: ESOPHAGOGASTRODUODENOSCOPY (EGD);  Surgeon: Rogene Houston, MD;  Location: [facility];  Service: Endoscopy;  Laterality: N/A;  . UPPER GASTROINTESTINAL ENDOSCOPY  12/05/2009  . UPPER GASTROINTESTINAL ENDOSCOPY  02/07/2009  . WISDOM TOOTH EXTRACTION    . WISDOM TOOTH EXTRACTION      OB History    Gravida Para Term Preterm AB Living   4 0 0   3 0   SAB TAB Ectopic Multiple Live Births   3 0 0 0 0      Allergies  Allergen Reactions  . Percocet [Oxycodone-Acetaminophen] Hives and Itching  . Amoxicillin Rash    Social History  Social History  . Marital status: Married    Spouse name: N/A  . Number of children: N/A  . Years of education: N/A   Social History Main Topics  . Smoking status: Former Smoker    Packs/day: 0.50    Years: 12.00    Types: Cigarettes  . Smokeless tobacco: Never Used  . Alcohol use No  . Drug use: No  . Sexual activity: Yes    Birth control/ protection: None   Other Topics Concern  . None   Social History Narrative  . None    Family History  Problem Relation Age of Onset  . Hypertension Mother   . Heart disease Father   . Cancer - Cervical Maternal Aunt    . Renal Disease Other   . Kidney disease Maternal Grandmother

## 2016-09-21 NOTE — Telephone Encounter (Signed)
Spoke with patient who states she has had "lots of stomach tightening" and very heavy thin discharge with an odor. She is having to wear a panty liner while working and when she coughs, fluid leaks out. Advised patient to be worked in today for evaluation of discharge. Pt verbalized understanding. Will work-in with Dr Elonda Husky.

## 2016-10-07 ENCOUNTER — Other Ambulatory Visit: Payer: BLUE CROSS/BLUE SHIELD

## 2016-10-07 ENCOUNTER — Encounter: Payer: Self-pay | Admitting: Advanced Practice Midwife

## 2016-10-07 ENCOUNTER — Ambulatory Visit (INDEPENDENT_AMBULATORY_CARE_PROVIDER_SITE_OTHER): Payer: BLUE CROSS/BLUE SHIELD | Admitting: Advanced Practice Midwife

## 2016-10-07 VITALS — BP 110/60 | HR 60 | Wt 233.0 lb

## 2016-10-07 DIAGNOSIS — Z3483 Encounter for supervision of other normal pregnancy, third trimester: Secondary | ICD-10-CM | POA: Diagnosis not present

## 2016-10-07 DIAGNOSIS — Z1389 Encounter for screening for other disorder: Secondary | ICD-10-CM

## 2016-10-07 DIAGNOSIS — Z3A28 28 weeks gestation of pregnancy: Secondary | ICD-10-CM | POA: Diagnosis not present

## 2016-10-07 DIAGNOSIS — Z331 Pregnant state, incidental: Secondary | ICD-10-CM

## 2016-10-07 LAB — POCT URINALYSIS DIPSTICK
GLUCOSE UA: NEGATIVE
Ketones, UA: NEGATIVE
Leukocytes, UA: NEGATIVE
NITRITE UA: NEGATIVE
RBC UA: NEGATIVE

## 2016-10-07 NOTE — Patient Instructions (Signed)
Third Trimester of Pregnancy The third trimester is from week 28 through week 40 (months 7 through 9). The third trimester is a time when the unborn baby (fetus) is growing rapidly. At the end of the ninth month, the fetus is about 20 inches in length and weighs 6-10 pounds. Body changes during your third trimester Your body will continue to go through many changes during pregnancy. The changes vary from woman to woman. During the third trimester:  Your weight will continue to increase. You can expect to gain 25-35 pounds (11-16 kg) by the end of the pregnancy.  You may begin to get stretch marks on your hips, abdomen, and breasts.  You may urinate more often because the fetus is moving lower into your pelvis and pressing on your bladder.  You may develop or continue to have heartburn. This is caused by increased hormones that slow down muscles in the digestive tract.  You may develop or continue to have constipation because increased hormones slow digestion and cause the muscles that push waste through your intestines to relax.  You may develop hemorrhoids. These are swollen veins (varicose veins) in the rectum that can itch or be painful.  You may develop swollen, bulging veins (varicose veins) in your legs.  You may have increased body aches in the pelvis, back, or thighs. This is due to weight gain and increased hormones that are relaxing your joints.  You may have changes in your hair. These can include thickening of your hair, rapid growth, and changes in texture. Some women also have hair loss during or after pregnancy, or hair that feels dry or thin. Your hair will most likely return to normal after your baby is born.  Your breasts will continue to grow and they will continue to become tender. A yellow fluid (colostrum) may leak from your breasts. This is the first milk you are producing for your baby.  Your belly button may stick out.  You may notice more swelling in your hands,  face, or ankles.  You may have increased tingling or numbness in your hands, arms, and legs. The skin on your belly may also feel numb.  You may feel short of breath because of your expanding uterus.  You may have more problems sleeping. This can be caused by the size of your belly, increased need to urinate, and an increase in your body's metabolism.  You may notice the fetus "dropping," or moving lower in your abdomen (lightening).  You may have increased vaginal discharge.  You may notice your joints feel loose and you may have pain around your pelvic bone.  What to expect at prenatal visits You will have prenatal exams every 2 weeks until week 36. Then you will have weekly prenatal exams. During a routine prenatal visit:  You will be weighed to make sure you and the baby are growing normally.  Your blood pressure will be taken.  Your abdomen will be measured to track your baby's growth.  The fetal heartbeat will be listened to.  Any test results from the previous visit will be discussed.  You may have a cervical check near your due date to see if your cervix has softened or thinned (effaced).  You will be tested for Group B streptococcus. This happens between 35 and 37 weeks.  Your health care provider may ask you:  What your birth plan is.  How you are feeling.  If you are feeling the baby move.  If you have had   any abnormal symptoms, such as leaking fluid, bleeding, severe headaches, or abdominal cramping.  If you are using any tobacco products, including cigarettes, chewing tobacco, and electronic cigarettes.  If you have any questions.  Other tests or screenings that may be performed during your third trimester include:  Blood tests that check for low iron levels (anemia).  Fetal testing to check the health, activity level, and growth of the fetus. Testing is done if you have certain medical conditions or if there are problems during the  pregnancy.  Nonstress test (NST). This test checks the health of your baby to make sure there are no signs of problems, such as the baby not getting enough oxygen. During this test, a belt is placed around your belly. The baby is made to move, and its heart rate is monitored during movement.  What is false labor? False labor is a condition in which you feel small, irregular tightenings of the muscles in the womb (contractions) that usually go away with rest, changing position, or drinking water. These are called Braxton Hicks contractions. Contractions may last for hours, days, or even weeks before true labor sets in. If contractions come at regular intervals, become more frequent, increase in intensity, or become painful, you should see your health care provider. What are the signs of labor?  Abdominal cramps.  Regular contractions that start at 10 minutes apart and become stronger and more frequent with time.  Contractions that start on the top of the uterus and spread down to the lower abdomen and back.  Increased pelvic pressure and dull back pain.  A watery or bloody mucus discharge that comes from the vagina.  Leaking of amniotic fluid. This is also known as your "water breaking." It could be a slow trickle or a gush. Let your health care provider know if it has a color or strange odor. If you have any of these signs, call your health care provider right away, even if it is before your due date. Follow these instructions at home: Medicines  Follow your health care provider's instructions regarding medicine use. Specific medicines may be either safe or unsafe to take during pregnancy.  Take a prenatal vitamin that contains at least 600 micrograms (mcg) of folic acid.  If you develop constipation, try taking a stool softener if your health care provider approves. Eating and drinking  Eat a balanced diet that includes fresh fruits and vegetables, whole grains, good sources of protein  such as meat, eggs, or tofu, and low-fat dairy. Your health care provider will help you determine the amount of weight gain that is right for you.  Avoid raw meat and uncooked cheese. These carry germs that can cause birth defects in the baby.  If you have low calcium intake from food, talk to your health care provider about whether you should take a daily calcium supplement.  Eat four or five small meals rather than three large meals a day.  Limit foods that are high in fat and processed sugars, such as fried and sweet foods.  To prevent constipation: ? Drink enough fluid to keep your urine clear or pale yellow. ? Eat foods that are high in fiber, such as fresh fruits and vegetables, whole grains, and beans. Activity  Exercise only as directed by your health care provider. Most women can continue their usual exercise routine during pregnancy. Try to exercise for 30 minutes at least 5 days a week. Stop exercising if you experience uterine contractions.  Avoid heavy   lifting.  Do not exercise in extreme heat or humidity, or at high altitudes.  Wear low-heel, comfortable shoes.  Practice good posture.  You may continue to have sex unless your health care provider tells you otherwise. Relieving pain and discomfort  Take frequent breaks and rest with your legs elevated if you have leg cramps or low back pain.  Take warm sitz baths to soothe any pain or discomfort caused by hemorrhoids. Use hemorrhoid cream if your health care provider approves.  Wear a good support bra to prevent discomfort from breast tenderness.  If you develop varicose veins: ? Wear support pantyhose or compression stockings as told by your healthcare provider. ? Elevate your feet for 15 minutes, 3-4 times a day. Prenatal care  Write down your questions. Take them to your prenatal visits.  Keep all your prenatal visits as told by your health care provider. This is important. Safety  Wear your seat belt at  all times when driving.  Make a list of emergency phone numbers, including numbers for family, friends, the hospital, and police and fire departments. General instructions  Avoid cat litter boxes and soil used by cats. These carry germs that can cause birth defects in the baby. If you have a cat, ask someone to clean the litter box for you.  Do not travel far distances unless it is absolutely necessary and only with the approval of your health care provider.  Do not use hot tubs, steam rooms, or saunas.  Do not drink alcohol.  Do not use any products that contain nicotine or tobacco, such as cigarettes and e-cigarettes. If you need help quitting, ask your health care provider.  Do not use any medicinal herbs or unprescribed drugs. These chemicals affect the formation and growth of the baby.  Do not douche or use tampons or scented sanitary pads.  Do not cross your legs for long periods of time.  To prepare for the arrival of your baby: ? Take prenatal classes to understand, practice, and ask questions about labor and delivery. ? Make a trial run to the hospital. ? Visit the hospital and tour the maternity area. ? Arrange for maternity or paternity leave through employers. ? Arrange for family and friends to take care of pets while you are in the hospital. ? Purchase a rear-facing car seat and make sure you know how to install it in your car. ? Pack your hospital bag. ? Prepare the baby's nursery. Make sure to remove all pillows and stuffed animals from the baby's crib to prevent suffocation.  Visit your dentist if you have not gone during your pregnancy. Use a soft toothbrush to brush your teeth and be gentle when you floss. Contact a health care provider if:  You are unsure if you are in labor or if your water has broken.  You become dizzy.  You have mild pelvic cramps, pelvic pressure, or nagging pain in your abdominal area.  You have lower back pain.  You have persistent  nausea, vomiting, or diarrhea.  You have an unusual or bad smelling vaginal discharge.  You have pain when you urinate. Get help right away if:  Your water breaks before 37 weeks.  You have regular contractions less than 5 minutes apart before 37 weeks.  You have a fever.  You are leaking fluid from your vagina.  You have spotting or bleeding from your vagina.  You have severe abdominal pain or cramping.  You have rapid weight loss or weight gain.    You have shortness of breath with chest pain.  You notice sudden or extreme swelling of your face, hands, ankles, feet, or legs.  Your baby makes fewer than 10 movements in 2 hours.  You have severe headaches that do not go away when you take medicine.  You have vision changes. Summary  The third trimester is from week 28 through week 40, months 7 through 9. The third trimester is a time when the unborn baby (fetus) is growing rapidly.  During the third trimester, your discomfort may increase as you and your baby continue to gain weight. You may have abdominal, leg, and back pain, sleeping problems, and an increased need to urinate.  During the third trimester your breasts will keep growing and they will continue to become tender. A yellow fluid (colostrum) may leak from your breasts. This is the first milk you are producing for your baby.  False labor is a condition in which you feel small, irregular tightenings of the muscles in the womb (contractions) that eventually go away. These are called Braxton Hicks contractions. Contractions may last for hours, days, or even weeks before true labor sets in.  Signs of labor can include: abdominal cramps; regular contractions that start at 10 minutes apart and become stronger and more frequent with time; watery or bloody mucus discharge that comes from the vagina; increased pelvic pressure and dull back pain; and leaking of amniotic fluid. This information is not intended to replace advice  given to you by your health care provider. Make sure you discuss any questions you have with your health care provider. Document Released: 06/02/2001 Document Revised: 11/14/2015 Document Reviewed: 08/09/2012 Elsevier Interactive Patient Education  2017 [employer]  

## 2016-10-07 NOTE — Progress Notes (Signed)
[id number] [redacted]w[redacted]d Estimated Date of Delivery: 12/29/16  Blood pressure 110/60, pulse 60, weight 233 lb (105.7 kg), last menstrual period 01/25/2016.   BP weight and urine results all reviewed and noted.  Please refer to the obstetrical flow sheet for the fundal height and fetal heart rate documentation:  Patient reports good fetal movement, denies any bleeding and no rupture of membranes symptoms or regular contractions. Patient is without complaints. protonix helps a lot All questions were answered.  Orders Placed This Encounter  Procedures  . POCT urinalysis dipstick    Plan:  Continued routine obstetrical care,   Return in about 3 weeks (around 10/28/2016) for LROB.

## 2016-10-08 LAB — CBC
HEMOGLOBIN: 11.2 g/dL (ref 11.1–15.9)
Hematocrit: 34.5 % (ref 34.0–46.6)
MCH: 26.6 pg (ref 26.6–33.0)
MCHC: 32.5 g/dL (ref 31.5–35.7)
MCV: 82 fL (ref 79–97)
Platelets: 261 10*3/uL (ref 150–379)
RBC: 4.21 x10E6/uL (ref 3.77–5.28)
RDW: 14 % (ref 12.3–15.4)
WBC: 10.5 10*3/uL (ref 3.4–10.8)

## 2016-10-08 LAB — GLUCOSE TOLERANCE, 2 HOURS W/ 1HR
GLUCOSE, 2 HOUR: 109 mg/dL (ref 65–152)
Glucose, 1 hour: 138 mg/dL (ref 65–179)
Glucose, Fasting: 78 mg/dL (ref 65–91)

## 2016-10-08 LAB — HIV ANTIBODY (ROUTINE TESTING W REFLEX): HIV SCREEN 4TH GENERATION: NONREACTIVE

## 2016-10-08 LAB — ANTIBODY SCREEN: Antibody Screen: NEGATIVE

## 2016-10-08 LAB — RPR: RPR: NONREACTIVE

## 2016-10-26 ENCOUNTER — Inpatient Hospital Stay (HOSPITAL_COMMUNITY)
Admission: AD | Admit: 2016-10-26 | Discharge: 2016-10-26 | Disposition: A | Discharge status: Home / Self Care | Payer: BLUE CROSS/BLUE SHIELD | Source: Ambulatory Visit | Attending: Obstetrics & Gynecology | Admitting: Obstetrics & Gynecology

## 2016-10-26 ENCOUNTER — Encounter (HOSPITAL_COMMUNITY): Payer: Self-pay | Admitting: *Deleted

## 2016-10-26 DIAGNOSIS — O99283 Endocrine, nutritional and metabolic diseases complicating pregnancy, third trimester: Secondary | ICD-10-CM | POA: Diagnosis not present

## 2016-10-26 DIAGNOSIS — Z8049 Family history of malignant neoplasm of other genital organs: Secondary | ICD-10-CM | POA: Insufficient documentation

## 2016-10-26 DIAGNOSIS — Z885 Allergy status to narcotic agent status: Secondary | ICD-10-CM | POA: Diagnosis not present

## 2016-10-26 DIAGNOSIS — Z841 Family history of disorders of kidney and ureter: Secondary | ICD-10-CM | POA: Diagnosis not present

## 2016-10-26 DIAGNOSIS — E282 Polycystic ovarian syndrome: Secondary | ICD-10-CM | POA: Insufficient documentation

## 2016-10-26 DIAGNOSIS — R109 Unspecified abdominal pain: Secondary | ICD-10-CM | POA: Diagnosis not present

## 2016-10-26 DIAGNOSIS — Z3A30 30 weeks gestation of pregnancy: Secondary | ICD-10-CM | POA: Insufficient documentation

## 2016-10-26 DIAGNOSIS — Z9049 Acquired absence of other specified parts of digestive tract: Secondary | ICD-10-CM | POA: Insufficient documentation

## 2016-10-26 DIAGNOSIS — Z3689 Encounter for other specified antenatal screening: Secondary | ICD-10-CM

## 2016-10-26 DIAGNOSIS — O99513 Diseases of the respiratory system complicating pregnancy, third trimester: Secondary | ICD-10-CM | POA: Diagnosis not present

## 2016-10-26 DIAGNOSIS — K219 Gastro-esophageal reflux disease without esophagitis: Secondary | ICD-10-CM | POA: Insufficient documentation

## 2016-10-26 DIAGNOSIS — Z9889 Other specified postprocedural states: Secondary | ICD-10-CM | POA: Insufficient documentation

## 2016-10-26 DIAGNOSIS — J45909 Unspecified asthma, uncomplicated: Secondary | ICD-10-CM | POA: Diagnosis not present

## 2016-10-26 DIAGNOSIS — Z880 Allergy status to penicillin: Secondary | ICD-10-CM | POA: Diagnosis not present

## 2016-10-26 DIAGNOSIS — Z8249 Family history of ischemic heart disease and other diseases of the circulatory system: Secondary | ICD-10-CM | POA: Diagnosis not present

## 2016-10-26 DIAGNOSIS — O26893 Other specified pregnancy related conditions, third trimester: Secondary | ICD-10-CM | POA: Diagnosis not present

## 2016-10-26 DIAGNOSIS — O4703 False labor before 37 completed weeks of gestation, third trimester: Secondary | ICD-10-CM | POA: Insufficient documentation

## 2016-10-26 DIAGNOSIS — Z87891 Personal history of nicotine dependence: Secondary | ICD-10-CM | POA: Diagnosis not present

## 2016-10-26 DIAGNOSIS — Z79899 Other long term (current) drug therapy: Secondary | ICD-10-CM | POA: Diagnosis not present

## 2016-10-26 DIAGNOSIS — O99613 Diseases of the digestive system complicating pregnancy, third trimester: Secondary | ICD-10-CM | POA: Insufficient documentation

## 2016-10-26 DIAGNOSIS — O479 False labor, unspecified: Secondary | ICD-10-CM

## 2016-10-26 LAB — URINALYSIS, ROUTINE W REFLEX MICROSCOPIC
BILIRUBIN URINE: NEGATIVE
Glucose, UA: NEGATIVE mg/dL
HGB URINE DIPSTICK: NEGATIVE
KETONES UR: NEGATIVE mg/dL
Leukocytes, UA: NEGATIVE
Nitrite: NEGATIVE
PROTEIN: NEGATIVE mg/dL
Specific Gravity, Urine: 1.015 (ref 1.005–1.030)
pH: 6 (ref 5.0–8.0)

## 2016-10-26 LAB — WET PREP, GENITAL
Clue Cells Wet Prep HPF POC: NONE SEEN
Sperm: NONE SEEN
TRICH WET PREP: NONE SEEN
Yeast Wet Prep HPF POC: NONE SEEN

## 2016-10-26 LAB — FETAL FIBRONECTIN: FETAL FIBRONECTIN: NEGATIVE

## 2016-10-26 NOTE — MAU Provider Note (Signed)
Chief Complaint:  Contractions   First Provider Initiated Contact with Patient 10/26/16 2140      HPI: Mckenzie Cooley is a 26 y.o. G4P0030 at [redacted]w[redacted]d pt of [facility] who presents to maternity admissions reporting abdominal cramping 4-5x/hour starting at 3pm today. The pain is intermittent, low in her abdomen and low back, similar to menstrual cramping, and is not improved with rest or increased PO fluids. The pain is associated with a thin yellow discharge but no itching/burning. There are no other associated symptoms. She reports good fetal movement, denies LOF, vaginal bleeding, vaginal itching/burning, urinary symptoms, h/a, dizziness, n/v, or fever/chills.    HPI  Past Medical History: Past Medical History:  Diagnosis Date  . Acid reflux Dx 2011  . Asthma    As a Child  . Bloating 2011  . PCOS (polycystic ovarian syndrome) Dx 2011  . Weight loss, abnormal     Past obstetric history: OB History  Gravida Para Term Preterm AB Living  4 0 0   3 0  SAB TAB Ectopic Multiple Live Births  3 0 0 0 0    # Outcome Date GA Lbr Len/2nd Weight Sex Delivery Anes PTL Lv  4 Current           3 SAB 2014          2 SAB 2012          1 SAB 2008              Past Surgical History: Past Surgical History:  Procedure Laterality Date  . CHOLECYSTECTOMY  2011   Dr.Byerly  . ESOPHAGOGASTRODUODENOSCOPY N/A 10/26/2014   Procedure: ESOPHAGOGASTRODUODENOSCOPY (EGD);  Surgeon: Rogene Houston, MD;  Location: [facility];  Service: Endoscopy;  Laterality: N/A;  . UPPER GASTROINTESTINAL ENDOSCOPY  12/05/2009  . UPPER GASTROINTESTINAL ENDOSCOPY  02/07/2009  . WISDOM TOOTH EXTRACTION    . WISDOM TOOTH EXTRACTION      Family History: Family History  Problem Relation Age of Onset  . Hypertension Mother   . Heart disease Father   . Cancer - Cervical Maternal Aunt   . Renal Disease Other   . Kidney disease Maternal Grandmother     Social History: Social History  Substance Use Topics  .  Smoking status: Former Smoker    Packs/day: 0.50    Years: 12.00    Types: Cigarettes  . Smokeless tobacco: Never Used  . Alcohol use No    Allergies:  Allergies  Allergen Reactions  . Percocet [Oxycodone-Acetaminophen] Hives and Itching  . Amoxicillin Rash    Meds:  Prescriptions Prior to Admission  Medication Sig Dispense Refill Last Dose  . acetaminophen (TYLENOL) 500 MG tablet Take 500 mg by mouth every 6 (six) hours as needed for mild pain or headache.   Past Week at Unknown time  . Doxylamine-Pyridoxine 10-10 MG TBEC 2 PO qhs; may take 1po in am and 1po in afternoon prn nausea (Patient taking differently: Take 2 tablets by mouth at bedtime. 2 PO qhs; may take 1po in am and 1po in afternoon prn nausea) 120 tablet 3 10/25/2016 at Unknown time  . pantoprazole (PROTONIX) 20 MG tablet Take 1 tablet (20 mg total) by mouth daily. 30 tablet 11 10/25/2016 at Unknown time  . Prenat w/o A FeCbnFeGlu-FA &B6 (CITRANATAL B-CALM) 20-1 & 25 (2) MG MISC Take daily (Patient not taking: Reported on 10/26/2016) 30 each 11 Not Taking at Unknown time    ROS:  Review of Systems  Constitutional: Negative for chills, fatigue and fever.  Eyes: Negative for visual disturbance.  Respiratory: Negative for shortness of breath.   Cardiovascular: Negative for chest pain.  Gastrointestinal: Positive for abdominal pain. Negative for nausea and vomiting.  Genitourinary: Positive for pelvic pain and vaginal discharge. Negative for difficulty urinating, dysuria, flank pain, vaginal bleeding and vaginal pain.  Musculoskeletal: Positive for back pain.  Neurological: Negative for dizziness and headaches.  Psychiatric/Behavioral: Negative.      I have reviewed patient's Past Medical Hx, Surgical Hx, Family Hx, Social Hx, medications and allergies.   Physical Exam  Patient Vitals for the past 24 hrs:  BP Temp Temp src Pulse Resp SpO2  10/26/16 2050 128/70 98.3 F (36.8 C) Oral 78 20 100 %   Constitutional:  Well-developed, well-nourished female in no acute distress.  Cardiovascular: normal rate Respiratory: normal effort GI: Abd soft, non-tender, gravid appropriate for gestational age.  MS: Extremities nontender, no edema, normal ROM Neurologic: Alert and oriented x 4.  GU: Neg CVAT.  PELVIC EXAM: Cervix pink, visually closed, without lesion, scant white creamy discharge, vaginal walls and external genitalia normal  Dilation: Closed Effacement (%): Thick Cervical Position: Posterior Exam by:: L.Leftwich-Kirby,CNMJ  FHT:  Baseline 130 , moderate variability, accelerations present, no decelerations Contractions: None on toco or to palpation   Labs: Results for orders placed or performed during the hospital encounter of 10/26/16 (from the past 24 hour(s))  Urinalysis, Routine w reflex microscopic     Status: None   Collection Time: 10/26/16  8:49 PM  Result Value Ref Range   Color, Urine YELLOW YELLOW   APPearance CLEAR CLEAR   Specific Gravity, Urine 1.015 1.005 - 1.030   pH 6.0 5.0 - 8.0   Glucose, UA NEGATIVE NEGATIVE mg/dL   Hgb urine dipstick NEGATIVE NEGATIVE   Bilirubin Urine NEGATIVE NEGATIVE   Ketones, ur NEGATIVE NEGATIVE mg/dL   Protein, ur NEGATIVE NEGATIVE mg/dL   Nitrite NEGATIVE NEGATIVE   Leukocytes, UA NEGATIVE NEGATIVE  Wet prep, genital     Status: Abnormal   Collection Time: 10/26/16  9:30 PM  Result Value Ref Range   Yeast Wet Prep HPF POC NONE SEEN NONE SEEN   Trich, Wet Prep NONE SEEN NONE SEEN   Clue Cells Wet Prep HPF POC NONE SEEN NONE SEEN   WBC, Wet Prep HPF POC MANY (A) NONE SEEN   Sperm NONE SEEN   Fetal fibronectin     Status: None   Collection Time: 10/26/16  9:30 PM  Result Value Ref Range   Fetal Fibronectin NEGATIVE NEGATIVE    A/Positive/-- (12/06 1522)  Imaging:  No results found.  MAU Course/MDM: NST reviewed and reactive Cervix closed and no ctx on toco but pt breathing through contractions during pelvic exam so FFN sent Report  to Marcille Buffy, CNM   Fatima Blank Certified Nurse-Midwife 10/26/2016 9:41 PM   ASSESSMENT/PLAN 1. Reva Bores Hicks contractions   2. [redacted] weeks gestation of pregnancy   3. NST (non-stress test) reactive    DC home Comfort measures reviewed  3rd Trimester precautions  PTL precautions  Fetal kick counts RX: NONE  Return to MAU as needed FU with OB as planned  [facility] for [facility] Follow up.   Specialty:  Obstetrics and Gynecology Contact information: [place] [phone]

## 2016-10-26 NOTE — Discharge Instructions (Signed)
Preterm Labor and Birth Information °Pregnancy normally lasts 39-41 weeks. Preterm labor is when labor starts early. It starts before you have been pregnant for 37 whole weeks. °What are the risk factors for preterm labor? °Preterm labor is more likely to occur in women who: °· Have an infection while pregnant. °· Have a cervix that is short. °· Have gone into preterm labor before. °· Have had surgery on their cervix. °· Are younger than age 26. °· Are older than age 35. °· Are African American. °· Are pregnant with two or more babies. °· Take street drugs while pregnant. °· Smoke while pregnant. °· Do not gain enough weight while pregnant. °· Got pregnant right after another pregnancy. °What are the symptoms of preterm labor? °Symptoms of preterm labor include: °· Cramps. The cramps may feel like the cramps some women get during their period. The cramps may happen with watery poop (diarrhea). °· Pain in the belly (abdomen). °· Pain in the lower back. °· Regular contractions or tightening. It may feel like your belly is getting tighter. °· Pressure in the lower belly that seems to get stronger. °· More fluid (discharge) leaking from the vagina. The fluid may be watery or bloody. °· Water breaking. °Why is it important to notice signs of preterm labor? °Babies who are born early may not be fully developed. They have a higher chance for: °· Long-term heart problems. °· Long-term lung problems. °· Trouble controlling body systems, like breathing. °· Bleeding in the brain. °· A condition called cerebral palsy. °· Learning difficulties. °· Death. °These risks are highest for babies who are born before 34 weeks of pregnancy. °How is preterm labor treated? °Treatment depends on: °· How long you were pregnant. °· Your condition. °· The health of your baby. °Treatment may involve: °· Having a stitch (suture) placed in your cervix. When you give birth, your cervix opens so the baby can come out. The stitch keeps the cervix  from opening too soon. °· Staying at the hospital. °· Taking or getting medicines, such as: °¨ Hormone medicines. °¨ Medicines to stop contractions. °¨ Medicines to help the baby’s lungs develop. °¨ Medicines to prevent your baby from having cerebral palsy. °What should I do if I am in preterm labor? °If you think you are going into labor too soon, call your doctor right away. °How can I prevent preterm labor? °· Do not use any tobacco products. °¨ Examples of these are cigarettes, chewing tobacco, and e-cigarettes. °¨ If you need help quitting, ask your doctor. °· Do not use street drugs. °· Do not use any medicines unless you ask your doctor if they are safe for you. °· Talk with your doctor before taking any herbal supplements. °· Make sure you gain enough weight. °· Watch for infection. If you think you might have an infection, get it checked right away. °· If you have gone into preterm labor before, tell your doctor. °This information is not intended to replace advice given to you by your health care provider. Make sure you discuss any questions you have with your health care provider. °Document Released: 09/04/2008 Document Revised: 11/19/2015 Document Reviewed: 10/30/2015 °Elsevier Interactive Patient Education © 2017 [employer] ° °

## 2016-10-26 NOTE — MAU Note (Signed)
Pt reports contractions that started around 430-500 pm. Has not timed them, but feels frequent. Pt denies vaginal bleeding but has some yellow discharge that itches. Reports good fetal movement.

## 2016-10-27 ENCOUNTER — Telehealth: Payer: Self-pay | Admitting: *Deleted

## 2016-10-27 LAB — GC/CHLAMYDIA PROBE AMP (~~LOC~~) NOT AT ARMC
Chlamydia: NEGATIVE
NEISSERIA GONORRHEA: NEGATIVE

## 2016-10-27 NOTE — Telephone Encounter (Signed)
Patient called stating she was seen in the ER last night for cramping. She is still hurting but not as bad but now has a headache. When she is bending over at work, she gets dizzy, lightheaded and sees spots. Advised with any movement such as bending or moving too quickly, those symptoms may occur. Encouraged to move slowly when possible and to increase fluids. Patient states she wants to decrease her hours at work. Patient has appointment tomorrow, advised to discuss with Manus Gunning at that visit. Aslo advised if spots or dizziness occurred when not bending or moving to let us know. Patient verbalized understanding.

## 2016-10-28 ENCOUNTER — Ambulatory Visit (INDEPENDENT_AMBULATORY_CARE_PROVIDER_SITE_OTHER): Payer: BLUE CROSS/BLUE SHIELD | Admitting: Advanced Practice Midwife

## 2016-10-28 ENCOUNTER — Encounter: Payer: Self-pay | Admitting: Advanced Practice Midwife

## 2016-10-28 VITALS — BP 122/60 | HR 91 | Wt 238.0 lb

## 2016-10-28 DIAGNOSIS — Z3483 Encounter for supervision of other normal pregnancy, third trimester: Secondary | ICD-10-CM

## 2016-10-28 DIAGNOSIS — Z331 Pregnant state, incidental: Secondary | ICD-10-CM

## 2016-10-28 DIAGNOSIS — Z1389 Encounter for screening for other disorder: Secondary | ICD-10-CM

## 2016-10-28 LAB — POCT URINALYSIS DIPSTICK
Blood, UA: NEGATIVE
Glucose, UA: NEGATIVE
Leukocytes, UA: NEGATIVE
NITRITE UA: NEGATIVE
PROTEIN UA: NEGATIVE

## 2016-10-28 NOTE — Progress Notes (Signed)
[id number] [redacted]w[redacted]d Estimated Date of Delivery: 12/29/16  Blood pressure 122/60, pulse 91, weight 238 lb (108 kg), last menstrual period 01/25/2016.   BP weight and urine results all reviewed and noted.  Please refer to the obstetrical flow sheet for the fundal height and fetal heart rate documentation:  Patient reports good fetal movement, denies any bleeding and no rupture of membranes symptoms or regular contractions. Patient is without complaints. Other than normal pregnancy complaints.  Note written to decrease work to 4 days/5-6 hour shifts All questions were answered.  Orders Placed This Encounter  Procedures  . POCT urinalysis dipstick    Plan:  Continued routine obstetrical care,   Return in about 2 weeks (around 11/11/2016) for LROB.

## 2016-10-28 NOTE — Patient Instructions (Signed)

## 2016-10-30 ENCOUNTER — Telehealth: Payer: Self-pay | Admitting: *Deleted

## 2016-10-30 NOTE — Telephone Encounter (Signed)
Patient called stating Diclegis was denied again. Informed I would resend through cover my meds.

## 2016-10-30 NOTE — Telephone Encounter (Signed)
LMOVM that [employer] denied Diclegis so I will run through medicaid.

## 2016-11-03 ENCOUNTER — Telehealth: Payer: Self-pay | Admitting: *Deleted

## 2016-11-03 NOTE — Telephone Encounter (Signed)
Patient called asking about PA for Diclegis. Informed PA was done and to ask Pharmacy to rerun claim

## 2016-11-04 ENCOUNTER — Encounter: Payer: Self-pay | Admitting: Family Medicine

## 2016-11-06 ENCOUNTER — Telehealth: Payer: Self-pay | Admitting: *Deleted

## 2016-11-06 NOTE — Telephone Encounter (Signed)
Patient called to inform that she has changed pharmacies.

## 2016-11-11 ENCOUNTER — Encounter: Payer: BLUE CROSS/BLUE SHIELD | Admitting: Obstetrics and Gynecology

## 2016-11-12 ENCOUNTER — Ambulatory Visit (INDEPENDENT_AMBULATORY_CARE_PROVIDER_SITE_OTHER): Payer: BLUE CROSS/BLUE SHIELD | Admitting: Obstetrics & Gynecology

## 2016-11-12 ENCOUNTER — Encounter: Payer: Self-pay | Admitting: Obstetrics & Gynecology

## 2016-11-12 VITALS — BP 122/78 | HR 75 | Wt 241.0 lb

## 2016-11-12 DIAGNOSIS — Z3483 Encounter for supervision of other normal pregnancy, third trimester: Secondary | ICD-10-CM

## 2016-11-12 DIAGNOSIS — Z331 Pregnant state, incidental: Secondary | ICD-10-CM

## 2016-11-12 DIAGNOSIS — Z1389 Encounter for screening for other disorder: Secondary | ICD-10-CM

## 2016-11-12 LAB — POCT URINALYSIS DIPSTICK
Blood, UA: NEGATIVE
Glucose, UA: NEGATIVE
KETONES UA: NEGATIVE
Nitrite, UA: NEGATIVE
PROTEIN UA: NEGATIVE

## 2016-11-12 NOTE — Progress Notes (Signed)
[id number] [redacted]w[redacted]d Estimated Date of Delivery: 12/29/16  Blood pressure 122/78, pulse 75, weight 241 lb (109.3 kg), last menstrual period 01/25/2016.   BP weight and urine results all reviewed and noted.  Please refer to the obstetrical flow sheet for the fundal height and fetal heart rate documentation:  Patient reports good fetal movement, denies any bleeding and no rupture of membranes symptoms or regular contractions. Patient is without complaints. All questions were answered.  Orders Placed This Encounter  Procedures  . POCT urinalysis dipstick    Plan:  Continued routine obstetrical care,   watch FH progression   Return in about 2 weeks (around 11/26/2016).

## 2016-11-16 ENCOUNTER — Encounter: Payer: Self-pay | Admitting: Obstetrics & Gynecology

## 2016-11-17 ENCOUNTER — Telehealth: Payer: Self-pay | Admitting: *Deleted

## 2016-11-17 NOTE — Telephone Encounter (Signed)
Called patient to schedule to be seen per Dr Elonda Husky to look at "bite". States she doesn't think she needs to be seen, that she was just freaking out. Advised to call back if it gets worse.

## 2016-11-26 ENCOUNTER — Ambulatory Visit (INDEPENDENT_AMBULATORY_CARE_PROVIDER_SITE_OTHER): Payer: BLUE CROSS/BLUE SHIELD | Admitting: Obstetrics & Gynecology

## 2016-11-26 ENCOUNTER — Encounter: Payer: Self-pay | Admitting: Obstetrics & Gynecology

## 2016-11-26 VITALS — BP 138/86 | HR 80 | Wt 244.0 lb

## 2016-11-26 DIAGNOSIS — Z3483 Encounter for supervision of other normal pregnancy, third trimester: Secondary | ICD-10-CM

## 2016-11-26 DIAGNOSIS — Z331 Pregnant state, incidental: Secondary | ICD-10-CM

## 2016-11-26 DIAGNOSIS — Z1389 Encounter for screening for other disorder: Secondary | ICD-10-CM

## 2016-11-26 LAB — POCT URINALYSIS DIPSTICK
Blood, UA: NEGATIVE
Glucose, UA: NEGATIVE
Ketones, UA: NEGATIVE
LEUKOCYTES UA: NEGATIVE
Nitrite, UA: NEGATIVE
PROTEIN UA: NEGATIVE

## 2016-11-26 NOTE — Progress Notes (Signed)
[id number] [redacted]w[redacted]d Estimated Date of Delivery: 12/29/16  Blood pressure 138/86, pulse 80, weight 244 lb (110.7 kg), last menstrual period 01/25/2016.   BP weight and urine results all reviewed and noted.  Please refer to the obstetrical flow sheet for the fundal height and fetal heart rate documentation:  Patient reports good fetal movement, denies any bleeding and no rupture of membranes symptoms or regular contractions. Patient is without complaints. All questions were answered.  Orders Placed This Encounter  Procedures  . POCT urinalysis dipstick    Plan:  Continued routine obstetrical care, cervix LTC firm some cervical mucous FH 38 will check EFW next week Keep an eye on her BP creeping up today  Return in about 1 week (around 12/03/2016) for sonogram for fetal weight, , LROB, with Dr Elonda Husky.

## 2016-12-04 ENCOUNTER — Other Ambulatory Visit: Payer: BLUE CROSS/BLUE SHIELD

## 2016-12-04 ENCOUNTER — Other Ambulatory Visit: Payer: Self-pay | Admitting: Obstetrics & Gynecology

## 2016-12-04 DIAGNOSIS — O26843 Uterine size-date discrepancy, third trimester: Secondary | ICD-10-CM

## 2016-12-07 ENCOUNTER — Ambulatory Visit (INDEPENDENT_AMBULATORY_CARE_PROVIDER_SITE_OTHER): Payer: BLUE CROSS/BLUE SHIELD

## 2016-12-07 ENCOUNTER — Ambulatory Visit (INDEPENDENT_AMBULATORY_CARE_PROVIDER_SITE_OTHER): Payer: BLUE CROSS/BLUE SHIELD | Admitting: Obstetrics & Gynecology

## 2016-12-07 ENCOUNTER — Encounter: Payer: Self-pay | Admitting: Obstetrics & Gynecology

## 2016-12-07 VITALS — BP 136/80 | HR 88 | Wt 251.0 lb

## 2016-12-07 DIAGNOSIS — O26843 Uterine size-date discrepancy, third trimester: Secondary | ICD-10-CM

## 2016-12-07 DIAGNOSIS — Z3483 Encounter for supervision of other normal pregnancy, third trimester: Secondary | ICD-10-CM | POA: Diagnosis not present

## 2016-12-07 DIAGNOSIS — Z1389 Encounter for screening for other disorder: Secondary | ICD-10-CM

## 2016-12-07 DIAGNOSIS — Z331 Pregnant state, incidental: Secondary | ICD-10-CM

## 2016-12-07 DIAGNOSIS — Z3A36 36 weeks gestation of pregnancy: Secondary | ICD-10-CM

## 2016-12-07 LAB — POCT URINALYSIS DIPSTICK
Glucose, UA: NEGATIVE
Ketones, UA: NEGATIVE
Nitrite, UA: NEGATIVE
PROTEIN UA: NEGATIVE
RBC UA: NEGATIVE

## 2016-12-07 NOTE — Progress Notes (Signed)
[place] 19+9 wks,cephalic,ant pl gr 3,afi 14.4 cm,fhr 130 bpm,EFW 3091 g 55%

## 2016-12-07 NOTE — Progress Notes (Signed)
[id number] [redacted]w[redacted]d Estimated Date of Delivery: 12/29/16  Blood pressure 136/80, pulse 88, weight 251 lb (113.9 kg), last menstrual period 01/25/2016.   BP weight and urine results all reviewed and noted.  Please refer to the obstetrical flow sheet for the fundal height and fetal heart rate documentation:  Patient reports good fetal movement, denies any bleeding and no rupture of membranes symptoms or regular contractions. Patient is without complaints. All questions were answered.  Orders Placed This Encounter  Procedures  . GC/Chlamydia Probe Amp  . Culture, beta strep (group b only)  . POCT urinalysis dipstick    Plan:  Continued routine obstetrical care, sonogram is normal, EFW55%  Return in about 1 week (around 12/14/2016) for LROB.

## 2016-12-08 LAB — GC/CHLAMYDIA PROBE AMP
Chlamydia trachomatis, NAA: NEGATIVE
NEISSERIA GONORRHOEAE BY PCR: NEGATIVE

## 2016-12-10 LAB — CULTURE, BETA STREP (GROUP B ONLY): Strep Gp B Culture: POSITIVE — AB

## 2016-12-15 ENCOUNTER — Encounter: Payer: Self-pay | Admitting: Obstetrics & Gynecology

## 2016-12-15 ENCOUNTER — Ambulatory Visit (INDEPENDENT_AMBULATORY_CARE_PROVIDER_SITE_OTHER): Payer: BLUE CROSS/BLUE SHIELD | Admitting: Obstetrics & Gynecology

## 2016-12-15 VITALS — BP 120/50 | HR 72 | Wt 252.0 lb

## 2016-12-15 DIAGNOSIS — Z331 Pregnant state, incidental: Secondary | ICD-10-CM

## 2016-12-15 DIAGNOSIS — Z3483 Encounter for supervision of other normal pregnancy, third trimester: Secondary | ICD-10-CM

## 2016-12-15 DIAGNOSIS — Z1389 Encounter for screening for other disorder: Secondary | ICD-10-CM

## 2016-12-15 LAB — POCT URINALYSIS DIPSTICK
Glucose, UA: NEGATIVE
Ketones, UA: NEGATIVE
Nitrite, UA: NEGATIVE
RBC UA: NEGATIVE

## 2016-12-15 NOTE — Progress Notes (Signed)
[id number] [redacted]w[redacted]d Estimated Date of Delivery: 12/29/16  Blood pressure (!) 120/50, pulse 72, weight 252 lb (114.3 kg), last menstrual period 01/25/2016.   BP weight and urine results all reviewed and noted.  Please refer to the obstetrical flow sheet for the fundal height and fetal heart rate documentation:  Patient reports good fetal movement, denies any bleeding and no rupture of membranes symptoms or regular contractions. Patient is without complaints. All questions were answered.  Orders Placed This Encounter  Procedures  . POCT urinalysis dipstick    Plan:  Continued routine obstetrical care,  Cervix FT/th/-2/soft/midplane  Return in about 1 week (around 12/22/2016) for LROB.

## 2016-12-22 ENCOUNTER — Ambulatory Visit (INDEPENDENT_AMBULATORY_CARE_PROVIDER_SITE_OTHER): Payer: BLUE CROSS/BLUE SHIELD | Admitting: Obstetrics & Gynecology

## 2016-12-22 ENCOUNTER — Encounter: Payer: Self-pay | Admitting: Obstetrics & Gynecology

## 2016-12-22 VITALS — BP 130/78 | HR 88 | Wt 258.0 lb

## 2016-12-22 DIAGNOSIS — Z331 Pregnant state, incidental: Secondary | ICD-10-CM

## 2016-12-22 DIAGNOSIS — Z3A39 39 weeks gestation of pregnancy: Secondary | ICD-10-CM

## 2016-12-22 DIAGNOSIS — Z1389 Encounter for screening for other disorder: Secondary | ICD-10-CM

## 2016-12-22 DIAGNOSIS — Z3483 Encounter for supervision of other normal pregnancy, third trimester: Secondary | ICD-10-CM

## 2016-12-22 LAB — POCT URINALYSIS DIPSTICK
Blood, UA: NEGATIVE
Glucose, UA: NEGATIVE
Ketones, UA: NEGATIVE
Leukocytes, UA: NEGATIVE
NITRITE UA: NEGATIVE

## 2016-12-22 NOTE — Progress Notes (Signed)
[id number] [redacted]w[redacted]d Estimated Date of Delivery: 12/29/16  Blood pressure 130/78, pulse 88, weight 258 lb (117 kg), last menstrual period 01/25/2016.   BP weight and urine results all reviewed and noted.  Please refer to the obstetrical flow sheet for the fundal height and fetal heart rate documentation:  Patient reports good fetal movement, denies any bleeding and no rupture of membranes symptoms or regular contractions. Patient is without complaints. All questions were answered.  Orders Placed This Encounter  Procedures  . POCT urinalysis dipstick    Plan:  Continued routine obstetrical care, 2/20/-3/vertex/soft, FH U+23 Minimal membrane stripping attempted, encouraged to keep homework assignment  Return in about 1 week (around 12/29/2016) for LROB.

## 2016-12-24 ENCOUNTER — Telehealth: Payer: Self-pay | Admitting: *Deleted

## 2016-12-24 NOTE — Telephone Encounter (Signed)
Patient called stating she had a small amount of "leaking" this am but nothing currently. She is not bleeding, baby is moving and is having just mild cramping but more pressure in her pelvis. Informed patient that the cramping could be the early stages of labor. Since she is not currently leaking, it did not sound convincing that it was her water. Advised to [facility] if cramping or contractions intensify to the point of more discomfort or she notices increased leaking. Encouraged movement, Tylenol and hands and knees position for comfort. Verbalized understanding.

## 2016-12-27 ENCOUNTER — Encounter (HOSPITAL_COMMUNITY): Payer: Self-pay | Admitting: *Deleted

## 2016-12-27 ENCOUNTER — Inpatient Hospital Stay (HOSPITAL_COMMUNITY)
Admission: AD | Admit: 2016-12-27 | Discharge: 2016-12-27 | Disposition: A | Discharge status: Home / Self Care | Payer: BLUE CROSS/BLUE SHIELD | Source: Ambulatory Visit | Attending: Obstetrics & Gynecology | Admitting: Obstetrics & Gynecology

## 2016-12-27 DIAGNOSIS — Z0371 Encounter for suspected problem with amniotic cavity and membrane ruled out: Secondary | ICD-10-CM

## 2016-12-27 DIAGNOSIS — O26893 Other specified pregnancy related conditions, third trimester: Secondary | ICD-10-CM | POA: Insufficient documentation

## 2016-12-27 DIAGNOSIS — O471 False labor at or after 37 completed weeks of gestation: Secondary | ICD-10-CM | POA: Diagnosis not present

## 2016-12-27 DIAGNOSIS — Z3483 Encounter for supervision of other normal pregnancy, third trimester: Secondary | ICD-10-CM

## 2016-12-27 DIAGNOSIS — Z3A39 39 weeks gestation of pregnancy: Secondary | ICD-10-CM

## 2016-12-27 DIAGNOSIS — N898 Other specified noninflammatory disorders of vagina: Secondary | ICD-10-CM | POA: Insufficient documentation

## 2016-12-27 DIAGNOSIS — O479 False labor, unspecified: Secondary | ICD-10-CM

## 2016-12-27 LAB — POCT FERN TEST: POCT FERN TEST: NEGATIVE

## 2016-12-27 NOTE — MAU Note (Signed)
Pt here with leaking of fluid. Denies any bleeding; was 2cm on last exam. Reports good fetal movement. Having some occasional tightenings.

## 2016-12-27 NOTE — Progress Notes (Signed)
S: Mckenzie Cooley is a 26 y.o. G4P0030 at [redacted]w[redacted]d  who presents to MAU today complaining of leaking of fluid since 2100. She denies vaginal bleeding. She endorses contractions. She reports normal fetal movement.    O: BP 107/85 (BP Location: Left Arm)   Pulse 68   Temp 98.2 F (36.8 C) (Oral)   Resp 20   Ht 5' (1.524 m)   Wt 266 lb (120.7 kg)   LMP 01/25/2016 (LMP Unknown)   SpO2 100%   BMI 51.95 kg/m  GENERAL: Well-developed, well-nourished female in no acute distress.  HEAD: Normocephalic, atraumatic.  CHEST: Normal effort of breathing, regular heart rate ABDOMEN: Soft, nontender, gravid PELVIC: Normal external female genitalia. Vagina is pink and rugated. Cervix with normal contour, no lesions. Normal discharge.  No pooling.   Cervical exam:  Dilation: 1.5 Effacement (%): Thick Cervical Position: Posterior Station: -3 Presentation: Vertex Exam by:: Haynes Bast SNM  Fetal Monitoring: Baseline: 120 Variability: moderate Accelerations: 15x15 Decelerations: none Contractions: irregular uc's  Results for orders placed or performed during the hospital encounter of 12/27/16 (from the past 24 hour(s))  POCT fern test     Status: Normal   Collection Time: 12/27/16 11:09 PM  Result Value Ref Range   POCT Fern Test Negative = intact amniotic membranes    A: [facility] at [redacted]w[redacted]d  Membranes intact  P: Discharge patient home in stable condition Labor precautions reviewed and when to return to MAU Follow up at [facility] as scheduled for prenatal care. Encouraged to return here or to other Urgent Care/ED if she develops worsening of symptoms, increase in pain, fever, or other concerning symptoms.   Wende Mott, Student-MidWife 12/27/2016 11:10 PM   I confirm that I have verified the information documented in the nurse midwife student's note and that I have also personally reperformed the physical exam and all medical decision making activities.   Marcille Buffy 12:21 AM  12/28/16

## 2016-12-27 NOTE — Discharge Instructions (Signed)
Braxton Hicks Contractions °Contractions of the uterus can occur throughout pregnancy, but they are not always a sign that you are in labor. You may have practice contractions called Braxton Hicks contractions. These false labor contractions are sometimes confused with true labor. °What are Braxton Hicks contractions? °Braxton Hicks contractions are tightening movements that occur in the muscles of the uterus before labor. Unlike true labor contractions, these contractions do not result in opening (dilation) and thinning of the cervix. Toward the end of pregnancy (32-34 weeks), Braxton Hicks contractions can happen more often and may become stronger. These contractions are sometimes difficult to tell apart from true labor because they can be very uncomfortable. You should not feel embarrassed if you go to the hospital with false labor. °Sometimes, the only way to tell if you are in true labor is for your health care provider to look for changes in the cervix. The health care provider will do a physical exam and may monitor your contractions. If you are not in true labor, the exam should show that your cervix is not dilating and your water has not broken. °If there are no prenatal problems or other health problems associated with your pregnancy, it is completely safe for you to be sent home with false labor. You may continue to have Braxton Hicks contractions until you go into true labor. °How can I tell the difference between true labor and false labor? °· Differences °? False labor °? Contractions last 30-70 seconds.: Contractions are usually shorter and not as strong as true labor contractions. °? Contractions become very regular.: Contractions are usually irregular. °? Discomfort is usually felt in the top of the uterus, and it spreads to the lower abdomen and low back.: Contractions are often felt in the front of the lower abdomen and in the groin. °? Contractions do not go away with walking.: Contractions may  go away when you walk around or change positions while lying down. °? Contractions usually become more intense and increase in frequency.: Contractions get weaker and are shorter-lasting as time goes on. °? The cervix dilates and gets thinner.: The cervix usually does not dilate or become thin. °Follow these instructions at home: °· Take over-the-counter and prescription medicines only as told by your health care provider. °· Keep up with your usual exercises and follow other instructions from your health care provider. °· Eat and drink lightly if you think you are going into labor. °· If Braxton Hicks contractions are making you uncomfortable: °? Change your position from lying down or resting to walking, or change from walking to resting. °? Sit and rest in a tub of warm water. °? Drink enough fluid to keep your urine clear or pale yellow. Dehydration may cause these contractions. °? Do slow and deep breathing several times an hour. °· Keep all follow-up prenatal visits as told by your health care provider. This is important. °Contact a health care provider if: °· You have a fever. °· You have continuous pain in your abdomen. °Get help right away if: °· Your contractions become stronger, more regular, and closer together. °· You have fluid leaking or gushing from your vagina. °· You pass blood-tinged mucus (bloody show). °· You have bleeding from your vagina. °· You have low back pain that you never had before. °· You feel your baby’s head pushing down and causing pelvic pressure. °· Your baby is not moving inside you as much as it used to. °Summary °· Contractions that occur before labor are   called Braxton Hicks contractions, false labor, or practice contractions.  Braxton Hicks contractions are usually shorter, weaker, farther apart, and less regular than true labor contractions. True labor contractions usually become progressively stronger and regular and they become more frequent.  Manage discomfort from  [facility] contractions by changing position, resting in a warm bath, drinking plenty of water, or practicing deep breathing. This information is not intended to replace advice given to you by your health care provider. Make sure you discuss any questions you have with your health care provider. Document Released: 06/08/2005 Document Revised: 04/27/2016 Document Reviewed: 04/27/2016 Elsevier Interactive Patient Education  2017 [place].  Fetal Movement Counts Patient Name: ________________________________________________ Patient Due Date: ____________________ What is a fetal movement count? A fetal movement count is the number of times that you feel your baby move during a certain amount of time. This may also be called a fetal kick count. A fetal movement count is recommended for every pregnant woman. You may be asked to start counting fetal movements as early as week 28 of your pregnancy. Pay attention to when your baby is most active. You may notice your baby's sleep and wake cycles. You may also notice things that make your baby move more. You should do a fetal movement count:  When your baby is normally most active.  At the same time each day.  A good time to count movements is while you are resting, after having something to eat and drink. How do I count fetal movements? 1. Find a quiet, comfortable area. Sit, or lie down on your side. 2. Write down the date, the start time and stop time, and the number of movements that you felt between those two times. Take this information with you to your health care visits. 3. For 2 hours, count kicks, flutters, swishes, rolls, and jabs. You should feel at least 10 movements during 2 hours. 4. You may stop counting after you have felt 10 movements. 5. If you do not feel 10 movements in 2 hours, have something to eat and drink. Then, keep resting and counting for 1 hour. If you feel at least 4 movements during that hour, you may stop  counting. Contact a health care provider if:  You feel fewer than 4 movements in 2 hours.  Your baby is not moving like he or she usually does. Date: ____________ Start time: ____________ Stop time: ____________ Movements: ____________ Date: ____________ Start time: ____________ Stop time: ____________ Movements: ____________ Date: ____________ Start time: ____________ Stop time: ____________ Movements: ____________ Date: ____________ Start time: ____________ Stop time: ____________ Movements: ____________ Date: ____________ Start time: ____________ Stop time: ____________ Movements: ____________ Date: ____________ Start time: ____________ Stop time: ____________ Movements: ____________ Date: ____________ Start time: ____________ Stop time: ____________ Movements: ____________ Date: ____________ Start time: ____________ Stop time: ____________ Movements: ____________ Date: ____________ Start time: ____________ Stop time: ____________ Movements: ____________ This information is not intended to replace advice given to you by your health care provider. Make sure you discuss any questions you have with your health care provider. Document Released: 07/08/2006 Document Revised: 02/05/2016 Document Reviewed: 07/18/2015 Elsevier Interactive Patient Education  2018 [place] of Pregnancy The third trimester is from week 28 through week 40 (months 7 through 9). The third trimester is a time when the unborn baby (fetus) is growing rapidly. At the end of the ninth month, the fetus is about 20 inches in length and weighs 6-10 pounds. Body changes during your third trimester  Your body will continue to go through many changes during pregnancy. The changes vary from woman to woman. During the third trimester:  Your weight will continue to increase. You can expect to gain 25-35 pounds (11-16 kg) by the end of the pregnancy.  You may begin to get stretch marks on your hips, abdomen, and  breasts.  You may urinate more often because the fetus is moving lower into your pelvis and pressing on your bladder.  You may develop or continue to have heartburn. This is caused by increased hormones that slow down muscles in the digestive tract.  You may develop or continue to have constipation because increased hormones slow digestion and cause the muscles that push waste through your intestines to relax.  You may develop hemorrhoids. These are swollen veins (varicose veins) in the rectum that can itch or be painful.  You may develop swollen, bulging veins (varicose veins) in your legs.  You may have increased body aches in the pelvis, back, or thighs. This is due to weight gain and increased hormones that are relaxing your joints.  You may have changes in your hair. These can include thickening of your hair, rapid growth, and changes in texture. Some women also have hair loss during or after pregnancy, or hair that feels dry or thin. Your hair will most likely return to normal after your baby is born.  Your breasts will continue to grow and they will continue to become tender. A yellow fluid (colostrum) may leak from your breasts. This is the first milk you are producing for your baby.  Your belly button may stick out.  You may notice more swelling in your hands, face, or ankles.  You may have increased tingling or numbness in your hands, arms, and legs. The skin on your belly may also feel numb.  You may feel short of breath because of your expanding uterus.  You may have more problems sleeping. This can be caused by the size of your belly, increased need to urinate, and an increase in your body's metabolism.  You may notice the fetus "dropping," or moving lower in your abdomen (lightening).  You may have increased vaginal discharge.  You may notice your joints feel loose and you may have pain around your pelvic bone.  What to expect at prenatal visits You will have prenatal  exams every 2 weeks until week 36. Then you will have weekly prenatal exams. During a routine prenatal visit:  You will be weighed to make sure you and the baby are growing normally.  Your blood pressure will be taken.  Your abdomen will be measured to track your baby's growth.  The fetal heartbeat will be listened to.  Any test results from the previous visit will be discussed.  You may have a cervical check near your due date to see if your cervix has softened or thinned (effaced).  You will be tested for Group B streptococcus. This happens between 35 and 37 weeks.  Your health care provider may ask you:  What your birth plan is.  How you are feeling.  If you are feeling the baby move.  If you have had any abnormal symptoms, such as leaking fluid, bleeding, severe headaches, or abdominal cramping.  If you are using any tobacco products, including cigarettes, chewing tobacco, and electronic cigarettes.  If you have any questions.  Other tests or screenings that may be performed during your third trimester include:  Blood tests that check for low iron  levels (anemia).  Fetal testing to check the health, activity level, and growth of the fetus. Testing is done if you have certain medical conditions or if there are problems during the pregnancy.  Nonstress test (NST). This test checks the health of your baby to make sure there are no signs of problems, such as the baby not getting enough oxygen. During this test, a belt is placed around your belly. The baby is made to move, and its heart rate is monitored during movement.  What is false labor? False labor is a condition in which you feel small, irregular tightenings of the muscles in the womb (contractions) that usually go away with rest, changing position, or drinking water. These are called Braxton Hicks contractions. Contractions may last for hours, days, or even weeks before true labor sets in. If contractions come at regular  intervals, become more frequent, increase in intensity, or become painful, you should see your health care provider. What are the signs of labor?  Abdominal cramps.  Regular contractions that start at 10 minutes apart and become stronger and more frequent with time.  Contractions that start on the top of the uterus and spread down to the lower abdomen and back.  Increased pelvic pressure and dull back pain.  A watery or bloody mucus discharge that comes from the vagina.  Leaking of amniotic fluid. This is also known as your "water breaking." It could be a slow trickle or a gush. Let your health care provider know if it has a color or strange odor. If you have any of these signs, call your health care provider right away, even if it is before your due date. Follow these instructions at home: Medicines  Follow your health care provider's instructions regarding medicine use. Specific medicines may be either safe or unsafe to take during pregnancy.  Take a prenatal vitamin that contains at least 600 micrograms (mcg) of folic acid.  If you develop constipation, try taking a stool softener if your health care provider approves. Eating and drinking  Eat a balanced diet that includes fresh fruits and vegetables, whole grains, good sources of protein such as meat, eggs, or tofu, and low-fat dairy. Your health care provider will help you determine the amount of weight gain that is right for you.  Avoid raw meat and uncooked cheese. These carry germs that can cause birth defects in the baby.  If you have low calcium intake from food, talk to your health care provider about whether you should take a daily calcium supplement.  Eat four or five small meals rather than three large meals a day.  Limit foods that are high in fat and processed sugars, such as fried and sweet foods.  To prevent constipation: ? Drink enough fluid to keep your urine clear or pale yellow. ? Eat foods that are high in  fiber, such as fresh fruits and vegetables, whole grains, and beans. Activity  Exercise only as directed by your health care provider. Most women can continue their usual exercise routine during pregnancy. Try to exercise for 30 minutes at least 5 days a week. Stop exercising if you experience uterine contractions.  Avoid heavy lifting.  Do not exercise in extreme heat or humidity, or at high altitudes.  Wear low-heel, comfortable shoes.  Practice good posture.  You may continue to have sex unless your health care provider tells you otherwise. Relieving pain and discomfort  Take frequent breaks and rest with your legs elevated if you have leg  cramps or low back pain.  Take warm sitz baths to soothe any pain or discomfort caused by hemorrhoids. Use hemorrhoid cream if your health care provider approves.  Wear a good support bra to prevent discomfort from breast tenderness.  If you develop varicose veins: ? Wear support pantyhose or compression stockings as told by your healthcare provider. ? Elevate your feet for 15 minutes, 3-4 times a day. Prenatal care  Write down your questions. Take them to your prenatal visits.  Keep all your prenatal visits as told by your health care provider. This is important. Safety  Wear your seat belt at all times when driving.  Make a list of emergency phone numbers, including numbers for family, friends, the hospital, and police and fire departments. General instructions  Avoid cat litter boxes and soil used by cats. These carry germs that can cause birth defects in the baby. If you have a cat, ask someone to clean the litter box for you.  Do not travel far distances unless it is absolutely necessary and only with the approval of your health care provider.  Do not use hot tubs, steam rooms, or saunas.  Do not drink alcohol.  Do not use any products that contain nicotine or tobacco, such as cigarettes and e-cigarettes. If you need help  quitting, ask your health care provider.  Do not use any medicinal herbs or unprescribed drugs. These chemicals affect the formation and growth of the baby.  Do not douche or use tampons or scented sanitary pads.  Do not cross your legs for long periods of time.  To prepare for the arrival of your baby: ? Take prenatal classes to understand, practice, and ask questions about labor and delivery. ? Make a trial run to the hospital. ? Visit the hospital and tour the maternity area. ? Arrange for maternity or paternity leave through employers. ? Arrange for family and friends to take care of pets while you are in the hospital. ? Purchase a rear-facing car seat and make sure you know how to install it in your car. ? Pack your hospital bag. ? Prepare the babys nursery. Make sure to remove all pillows and stuffed animals from the baby's crib to prevent suffocation.  Visit your dentist if you have not gone during your pregnancy. Use a soft toothbrush to brush your teeth and be gentle when you floss. Contact a health care provider if:  You are unsure if you are in labor or if your water has broken.  You become dizzy.  You have mild pelvic cramps, pelvic pressure, or nagging pain in your abdominal area.  You have lower back pain.  You have persistent nausea, vomiting, or diarrhea.  You have an unusual or bad smelling vaginal discharge.  You have pain when you urinate. Get help right away if:  Your water breaks before 37 weeks.  You have regular contractions less than 5 minutes apart before 37 weeks.  You have a fever.  You are leaking fluid from your vagina.  You have spotting or bleeding from your vagina.  You have severe abdominal pain or cramping.  You have rapid weight loss or weight gain.  You have shortness of breath with chest pain.  You notice sudden or extreme swelling of your face, hands, ankles, feet, or legs.  Your baby makes fewer than 10 movements in 2  hours.  You have severe headaches that do not go away when you take medicine.  You have vision changes. Summary  The third trimester is from week 28 through week 40, months 7 through 9. The third trimester is a time when the unborn baby (fetus) is growing rapidly.  During the third trimester, your discomfort may increase as you and your baby continue to gain weight. You may have abdominal, leg, and back pain, sleeping problems, and an increased need to urinate.  During the third trimester your breasts will keep growing and they will continue to become tender. A yellow fluid (colostrum) may leak from your breasts. This is the first milk you are producing for your baby.  False labor is a condition in which you feel small, irregular tightenings of the muscles in the womb (contractions) that eventually go away. These are called Braxton Hicks contractions. Contractions may last for hours, days, or even weeks before true labor sets in.  Signs of labor can include: abdominal cramps; regular contractions that start at 10 minutes apart and become stronger and more frequent with time; watery or bloody mucus discharge that comes from the vagina; increased pelvic pressure and dull back pain; and leaking of amniotic fluid. This information is not intended to replace advice given to you by your health care provider. Make sure you discuss any questions you have with your health care provider. Document Released: 06/02/2001 Document Revised: 11/14/2015 Document Reviewed: 08/09/2012 Elsevier Interactive Patient Education  2017 [employer].

## 2016-12-29 ENCOUNTER — Encounter: Payer: Self-pay | Admitting: Obstetrics & Gynecology

## 2016-12-29 ENCOUNTER — Telehealth (HOSPITAL_COMMUNITY): Payer: Self-pay | Admitting: *Deleted

## 2016-12-29 ENCOUNTER — Encounter (HOSPITAL_COMMUNITY): Payer: Self-pay | Admitting: *Deleted

## 2016-12-29 ENCOUNTER — Ambulatory Visit (INDEPENDENT_AMBULATORY_CARE_PROVIDER_SITE_OTHER): Payer: BLUE CROSS/BLUE SHIELD | Admitting: Obstetrics & Gynecology

## 2016-12-29 ENCOUNTER — Other Ambulatory Visit: Payer: Self-pay | Admitting: Obstetrics & Gynecology

## 2016-12-29 VITALS — BP 140/70 | HR 74 | Wt 267.0 lb

## 2016-12-29 DIAGNOSIS — Z1389 Encounter for screening for other disorder: Secondary | ICD-10-CM

## 2016-12-29 DIAGNOSIS — Z3A40 40 weeks gestation of pregnancy: Secondary | ICD-10-CM | POA: Diagnosis not present

## 2016-12-29 DIAGNOSIS — Z3483 Encounter for supervision of other normal pregnancy, third trimester: Secondary | ICD-10-CM

## 2016-12-29 DIAGNOSIS — Z331 Pregnant state, incidental: Secondary | ICD-10-CM

## 2016-12-29 LAB — POCT URINALYSIS DIPSTICK
GLUCOSE UA: NEGATIVE
Ketones, UA: NEGATIVE
Leukocytes, UA: NEGATIVE
NITRITE UA: NEGATIVE
RBC UA: NEGATIVE

## 2016-12-29 NOTE — Progress Notes (Signed)
[id number] [redacted]w[redacted]d Estimated Date of Delivery: 12/29/16  Blood pressure 140/70, pulse 74, weight 267 lb (121.1 kg), last menstrual period 01/25/2016.   BP weight and urine results all reviewed and noted.  Please refer to the obstetrical flow sheet for the fundal height and fetal heart rate documentation:  Patient reports good fetal movement, denies any bleeding and no rupture of membranes symptoms or regular contractions. Patient is without complaints. All questions were answered.  Orders Placed This Encounter  Procedures  . POCT urinalysis dipstick    Plan:  Continued routine obstetrical care, induction scheduled 01/05/2017@0730  cx 2/50/-2/soft/midplane  Return in about 6 weeks (around 02/09/2017) for post partum visit.

## 2016-12-29 NOTE — Treatment Plan (Signed)
   Induction Assessment Scheduling Form: Fax to [facility]:  [phone]  Mckenzie Cooley                                                                                   DOB:  04/06/1991                                                            MRN:  [id number]                                                                     Phone #:   [phone]                         Provider:  [facility]  GP:  [id number]                                                            Estimated Date of Delivery: 12/29/16  Dating Criteria: 9 week sonogram    Medical Indications for induction:  Post dates Admission Date/Time:  01/05/2017@0730  Gestational age on admission:  [redacted]w[redacted]d   Filed Weights   12/29/16 1016  Weight: 267 lb (121.1 kg)   HIV:  Non Reactive (04/18 0906) [id number]    2 cm dilated, 50 effaced, -2 station, cervical position mid, consistency soft, Bishop score 6, presenting part vertex   Method of induction(proposed):  choice   Scheduling Provider Signature:  Florian Buff, MD                                            Today's Date:  12/29/2016   Scheuled with [name] 12/29/2016 10:55 AM

## 2016-12-29 NOTE — Telephone Encounter (Signed)
Preadmission screen  

## 2016-12-30 ENCOUNTER — Other Ambulatory Visit: Payer: Self-pay | Admitting: Advanced Practice Midwife

## 2017-01-01 ENCOUNTER — Inpatient Hospital Stay (HOSPITAL_COMMUNITY): Payer: BLUE CROSS/BLUE SHIELD | Admitting: Anesthesiology

## 2017-01-01 ENCOUNTER — Inpatient Hospital Stay (HOSPITAL_COMMUNITY)
Admission: AD | Admit: 2017-01-01 | Discharge: 2017-01-05 | DRG: 765 | Disposition: A | Discharge status: Home / Self Care | Payer: BLUE CROSS/BLUE SHIELD | Source: Ambulatory Visit | Attending: Obstetrics and Gynecology | Admitting: Obstetrics and Gynecology

## 2017-01-01 ENCOUNTER — Encounter (HOSPITAL_COMMUNITY): Payer: Self-pay | Admitting: *Deleted

## 2017-01-01 DIAGNOSIS — O1400 Mild to moderate pre-eclampsia, unspecified trimester: Secondary | ICD-10-CM | POA: Diagnosis present

## 2017-01-01 DIAGNOSIS — O9081 Anemia of the puerperium: Secondary | ICD-10-CM | POA: Diagnosis not present

## 2017-01-01 DIAGNOSIS — Z6841 Body Mass Index (BMI) 40.0 and over, adult: Secondary | ICD-10-CM

## 2017-01-01 DIAGNOSIS — K219 Gastro-esophageal reflux disease without esophagitis: Secondary | ICD-10-CM | POA: Diagnosis not present

## 2017-01-01 DIAGNOSIS — Z8249 Family history of ischemic heart disease and other diseases of the circulatory system: Secondary | ICD-10-CM | POA: Diagnosis not present

## 2017-01-01 DIAGNOSIS — O99214 Obesity complicating childbirth: Secondary | ICD-10-CM | POA: Diagnosis not present

## 2017-01-01 DIAGNOSIS — Z880 Allergy status to penicillin: Secondary | ICD-10-CM | POA: Diagnosis not present

## 2017-01-01 DIAGNOSIS — O99824 Streptococcus B carrier state complicating childbirth: Secondary | ICD-10-CM | POA: Diagnosis present

## 2017-01-01 DIAGNOSIS — Z3483 Encounter for supervision of other normal pregnancy, third trimester: Secondary | ICD-10-CM

## 2017-01-01 DIAGNOSIS — O133 Gestational [pregnancy-induced] hypertension without significant proteinuria, third trimester: Secondary | ICD-10-CM | POA: Diagnosis present

## 2017-01-01 DIAGNOSIS — O411230 Chorioamnionitis, third trimester, not applicable or unspecified: Secondary | ICD-10-CM | POA: Diagnosis not present

## 2017-01-01 DIAGNOSIS — O1403 Mild to moderate pre-eclampsia, third trimester: Secondary | ICD-10-CM

## 2017-01-01 DIAGNOSIS — Z885 Allergy status to narcotic agent status: Secondary | ICD-10-CM

## 2017-01-01 DIAGNOSIS — Z812 Family history of tobacco abuse and dependence: Secondary | ICD-10-CM | POA: Diagnosis not present

## 2017-01-01 DIAGNOSIS — Z87891 Personal history of nicotine dependence: Secondary | ICD-10-CM

## 2017-01-01 DIAGNOSIS — O9962 Diseases of the digestive system complicating childbirth: Secondary | ICD-10-CM | POA: Diagnosis present

## 2017-01-01 DIAGNOSIS — Z23 Encounter for immunization: Secondary | ICD-10-CM | POA: Diagnosis not present

## 2017-01-01 DIAGNOSIS — O1404 Mild to moderate pre-eclampsia, complicating childbirth: Principal | ICD-10-CM | POA: Diagnosis present

## 2017-01-01 DIAGNOSIS — D62 Acute posthemorrhagic anemia: Secondary | ICD-10-CM | POA: Diagnosis not present

## 2017-01-01 DIAGNOSIS — Z3A40 40 weeks gestation of pregnancy: Secondary | ICD-10-CM | POA: Diagnosis not present

## 2017-01-01 LAB — COMPREHENSIVE METABOLIC PANEL
ALK PHOS: 127 U/L — AB (ref 38–126)
ALT: 17 U/L (ref 14–54)
ALT: 17 U/L (ref 14–54)
ANION GAP: 6 (ref 5–15)
ANION GAP: 8 (ref 5–15)
AST: 21 U/L (ref 15–41)
AST: 24 U/L (ref 15–41)
Albumin: 2.6 g/dL — ABNORMAL LOW (ref 3.5–5.0)
Albumin: 2.7 g/dL — ABNORMAL LOW (ref 3.5–5.0)
Alkaline Phosphatase: 134 U/L — ABNORMAL HIGH (ref 38–126)
BILIRUBIN TOTAL: 0.2 mg/dL — AB (ref 0.3–1.2)
BUN: 10 mg/dL (ref 6–20)
BUN: 8 mg/dL (ref 6–20)
CALCIUM: 8.7 mg/dL — AB (ref 8.9–10.3)
CHLORIDE: 109 mmol/L (ref 101–111)
CO2: 23 mmol/L (ref 22–32)
CO2: 20 mmol/L — ABNORMAL LOW (ref 22–32)
Calcium: 9 mg/dL (ref 8.9–10.3)
Chloride: 108 mmol/L (ref 101–111)
Creatinine, Ser: 0.65 mg/dL (ref 0.44–1.00)
Creatinine, Ser: 0.63 mg/dL (ref 0.44–1.00)
GFR calc Af Amer: >60 mL/min (ref >60)
GFR calc non Af Amer: >60 mL/min (ref >60)
Glucose, Bld: 86 mg/dL (ref 65–99)
Glucose, Bld: 89 mg/dL (ref 65–99)
POTASSIUM: 4.3 mmol/L (ref 3.5–5.1)
Potassium: 3.9 mmol/L (ref 3.5–5.1)
SODIUM: 136 mmol/L (ref 135–145)
Sodium: 138 mmol/L (ref 135–145)
TOTAL PROTEIN: 6.3 g/dL — AB (ref 6.5–8.1)
Total Bilirubin: 0.5 mg/dL (ref 0.3–1.2)
Total Protein: 5.9 g/dL — ABNORMAL LOW (ref 6.5–8.1)

## 2017-01-01 LAB — PROTEIN / CREATININE RATIO, URINE
CREATININE, URINE: 133 mg/dL
CREATININE, URINE: 199 mg/dL
PROTEIN CREATININE RATIO: 0.32 mg/mg{creat} — AB (ref 0.00–0.15)
Protein Creatinine Ratio: 0.18 mg/mg{Cre} — ABNORMAL HIGH (ref 0.00–0.15)
TOTAL PROTEIN, URINE: 42 mg/dL
TOTAL PROTEIN, URINE: 35 mg/dL

## 2017-01-01 LAB — URINALYSIS, ROUTINE W REFLEX MICROSCOPIC
Bilirubin Urine: NEGATIVE
GLUCOSE, UA: NEGATIVE mg/dL
KETONES UR: NEGATIVE mg/dL
NITRITE: NEGATIVE
PH: 7 (ref 5.0–8.0)
PROTEIN: 30 mg/dL — AB
Specific Gravity, Urine: 1.023 (ref 1.005–1.030)

## 2017-01-01 LAB — CBC
HCT: 34.9 % — ABNORMAL LOW (ref 36.0–46.0)
HCT: 34.9 % — ABNORMAL LOW (ref 36.0–46.0)
Hemoglobin: 11.3 g/dL — ABNORMAL LOW (ref 12.0–15.0)
Hemoglobin: 11.2 g/dL — ABNORMAL LOW (ref 12.0–15.0)
MCH: 25.1 pg — ABNORMAL LOW (ref 26.0–34.0)
MCH: 24.7 pg — AB (ref 26.0–34.0)
MCHC: 32.4 g/dL (ref 30.0–36.0)
MCHC: 32.1 g/dL (ref 30.0–36.0)
MCV: 77.4 fL — ABNORMAL LOW (ref 78.0–100.0)
MCV: 77 fL — ABNORMAL LOW (ref 78.0–100.0)
PLATELETS: 170 10*3/uL (ref 150–400)
Platelets: 180 10*3/uL (ref 150–400)
RBC: 4.51 MIL/uL (ref 3.87–5.11)
RBC: 4.53 MIL/uL (ref 3.87–5.11)
RDW: 15.7 % — ABNORMAL HIGH (ref 11.5–15.5)
RDW: 15.7 % — ABNORMAL HIGH (ref 11.5–15.5)
WBC: 10.2 10*3/uL (ref 4.0–10.5)
WBC: 12.8 10*3/uL — ABNORMAL HIGH (ref 4.0–10.5)

## 2017-01-01 LAB — TYPE AND SCREEN
ABO/RH(D): A POS
Antibody Screen: NEGATIVE

## 2017-01-01 LAB — ABO/RH: ABO/RH(D): A POS

## 2017-01-01 MED ORDER — LIDOCAINE HCL (PF) 1 % IJ SOLN: 30.0000 mL | INTRAMUSCULAR | Status: DC | PRN

## 2017-01-01 MED ORDER — DIPHENHYDRAMINE HCL 50 MG/ML IJ SOLN
12.5000 mg | INTRAMUSCULAR | Status: DC | PRN
  Administered 2017-01-01: 12.5 mg via INTRAVENOUS
  Filled 2017-01-01: qty 1

## 2017-01-01 MED ORDER — SOD CITRATE-CITRIC ACID 500-334 MG/5ML PO SOLN
30.0000 mL | ORAL | Status: DC | PRN
  Administered 2017-01-02: 30 mL via ORAL

## 2017-01-01 MED ORDER — OXYTOCIN 40 UNITS IN LACTATED RINGERS INFUSION - SIMPLE MED
1.0000 m[IU]/min | INTRAVENOUS | Status: DC
  Administered 2017-01-01: 2 m[IU]/min via INTRAVENOUS
  Filled 2017-01-01: qty 1000

## 2017-01-01 MED ORDER — FENTANYL CITRATE (PF) 100 MCG/2ML IJ SOLN
100.0000 ug | INTRAMUSCULAR | Status: DC | PRN
  Administered 2017-01-01: 100 ug via INTRAVENOUS
  Filled 2017-01-01: qty 2

## 2017-01-01 MED ORDER — LIDOCAINE HCL (PF) 1 % IJ SOLN
INTRAMUSCULAR | Status: DC | PRN
Start: 2017-01-01 — End: 2017-01-03
  Administered 2017-01-01 (×2): 5 mL

## 2017-01-01 MED ORDER — LACTATED RINGERS IV SOLN
500.0000 mL | INTRAVENOUS | Status: DC | PRN
  Administered 2017-01-01: 500 mL via INTRAVENOUS
  Administered 2017-01-02: 250 mL via INTRAVENOUS

## 2017-01-01 MED ORDER — EPHEDRINE 5 MG/ML INJ: 10.0000 mg | INTRAVENOUS | Status: DC | PRN

## 2017-01-01 MED ORDER — TERBUTALINE SULFATE 1 MG/ML IJ SOLN: 0.2500 mg | Freq: Once | INTRAMUSCULAR | Status: DC | PRN

## 2017-01-01 MED ORDER — FENTANYL 2.5 MCG/ML BUPIVACAINE 1/10 % EPIDURAL INFUSION (WH - ANES)
14.0000 mL/h | INTRAMUSCULAR | Status: DC | PRN
  Administered 2017-01-01 – 2017-01-02 (×4): 14 mL/h via EPIDURAL
  Filled 2017-01-01 (×4): qty 100

## 2017-01-01 MED ORDER — LABETALOL HCL 5 MG/ML IV SOLN: 20.0000 mg | INTRAVENOUS | Status: DC | PRN

## 2017-01-01 MED ORDER — LACTATED RINGERS IV SOLN
INTRAVENOUS | Status: DC
  Administered 2017-01-01 – 2017-01-02 (×4): via INTRAVENOUS

## 2017-01-01 MED ORDER — MISOPROSTOL 25 MCG QUARTER TABLET
25.0000 ug | ORAL_TABLET | ORAL | Status: DC | PRN
  Administered 2017-01-01: 25 ug via VAGINAL
  Filled 2017-01-01 (×3): qty 1

## 2017-01-01 MED ORDER — CEFAZOLIN SODIUM-DEXTROSE 1-4 GM/50ML-% IV SOLN
1.0000 g | Freq: Three times a day (TID) | INTRAVENOUS | Status: DC
  Administered 2017-01-01 – 2017-01-02 (×3): 1 g via INTRAVENOUS
  Filled 2017-01-01 (×4): qty 50

## 2017-01-01 MED ORDER — ACETAMINOPHEN 325 MG PO TABS
650.0000 mg | ORAL_TABLET | ORAL | Status: DC | PRN
  Administered 2017-01-01 – 2017-01-02 (×2): 650 mg via ORAL
  Filled 2017-01-01: qty 2

## 2017-01-01 MED ORDER — LIDOCAINE HCL (PF) 1 % IJ SOLN
30.0000 mL | INTRAMUSCULAR | Status: DC | PRN
Start: 2017-01-01 — End: 2017-01-02

## 2017-01-01 MED ORDER — PHENYLEPHRINE 40 MCG/ML (10ML) SYRINGE FOR IV PUSH (FOR BLOOD PRESSURE SUPPORT): 80.0000 ug | PREFILLED_SYRINGE | INTRAVENOUS | Status: DC | PRN

## 2017-01-01 MED ORDER — OXYTOCIN BOLUS FROM INFUSION: 500.0000 mL | Freq: Once | INTRAVENOUS | Status: DC

## 2017-01-01 MED ORDER — OXYTOCIN 10 UNIT/ML IJ SOLN: 10.0000 [IU] | Freq: Once | INTRAMUSCULAR | Status: DC | PRN

## 2017-01-01 MED ORDER — LACTATED RINGERS IV SOLN
500.0000 mL | Freq: Once | INTRAVENOUS | Status: AC
  Administered 2017-01-01: 500 mL via INTRAVENOUS

## 2017-01-01 MED ORDER — LACTATED RINGERS IV SOLN
500.0000 mL | INTRAVENOUS | Status: DC | PRN
  Administered 2017-01-02: 1000 mL via INTRAVENOUS

## 2017-01-01 MED ORDER — ONDANSETRON HCL 4 MG/2ML IJ SOLN
4.0000 mg | Freq: Four times a day (QID) | INTRAMUSCULAR | Status: DC | PRN
  Administered 2017-01-02: 4 mg via INTRAVENOUS
  Filled 2017-01-01: qty 2

## 2017-01-01 MED ORDER — CEFAZOLIN SODIUM-DEXTROSE 2-4 GM/100ML-% IV SOLN
2.0000 g | Freq: Once | INTRAVENOUS | Status: AC
  Administered 2017-01-01: 2 g via INTRAVENOUS
  Filled 2017-01-01 (×2): qty 100

## 2017-01-01 MED ORDER — BUTORPHANOL TARTRATE 1 MG/ML IJ SOLN
1.0000 mg | INTRAMUSCULAR | Status: DC | PRN
  Administered 2017-01-01: 1 mg via INTRAVENOUS
  Filled 2017-01-01: qty 1

## 2017-01-01 MED ORDER — PHENYLEPHRINE 40 MCG/ML (10ML) SYRINGE FOR IV PUSH (FOR BLOOD PRESSURE SUPPORT)
80.0000 ug | PREFILLED_SYRINGE | INTRAVENOUS | Status: DC | PRN
  Filled 2017-01-01: qty 10

## 2017-01-01 MED ORDER — OXYTOCIN 40 UNITS IN LACTATED RINGERS INFUSION - SIMPLE MED: 2.5000 [IU]/h | INTRAVENOUS | Status: DC

## 2017-01-01 MED ORDER — HYDRALAZINE HCL 20 MG/ML IJ SOLN: 10.0000 mg | Freq: Once | INTRAMUSCULAR | Status: DC | PRN

## 2017-01-01 MED ORDER — SOD CITRATE-CITRIC ACID 500-334 MG/5ML PO SOLN
30.0000 mL | ORAL | Status: DC | PRN
  Filled 2017-01-01: qty 15

## 2017-01-01 NOTE — H&P (Signed)
Mckenzie Cooley is a 26 y.o. female presenting for bloody show, contractions and sharp lower abdominal pain. OB History    Gravida Para Term Preterm AB Living   4 0 0   3 0   SAB TAB Ectopic Multiple Live Births   3 0 0 0 0     Past Medical History:  Diagnosis Date  . Acid reflux Dx 2011  . Asthma    As a Child  . Bloating 2011  . Weight loss, abnormal    Past Surgical History:  Procedure Laterality Date  . CHOLECYSTECTOMY  2011   Dr.Byerly  . ESOPHAGOGASTRODUODENOSCOPY N/A 10/26/2014   Procedure: ESOPHAGOGASTRODUODENOSCOPY (EGD);  Surgeon: Rogene Houston, MD;  Location: [facility];  Service: Endoscopy;  Laterality: N/A;  . UPPER GASTROINTESTINAL ENDOSCOPY  12/05/2009  . UPPER GASTROINTESTINAL ENDOSCOPY  02/07/2009  . WISDOM TOOTH EXTRACTION    . WISDOM TOOTH EXTRACTION     Family History: family history includes Cancer - Cervical in her maternal aunt; Heart disease in her father; Hypertension in her mother; Kidney disease in her maternal grandmother; Renal Disease in her maternal grandmother. Social History:  reports that she has quit smoking. Her smoking use included Cigarettes. She has a 6.00 pack-year smoking history. She has never used smokeless tobacco. She reports that she does not drink alcohol or use drugs.     Maternal Diabetes: No Genetic Screening: Normal Maternal Ultrasounds/Referrals: Normal Fetal Ultrasounds or other Referrals:  None Maternal Substance Abuse:  No Significant Maternal Medications:  None Significant Maternal Lab Results:  Lab values include: Group B Strep positive Other Comments:  None  Review of Systems  Constitutional: Negative.   HENT: Negative.   Eyes: Negative.   Respiratory: Negative.   Cardiovascular: Negative.   Gastrointestinal: Positive for abdominal pain (sharp lower abd pain).  Genitourinary: Negative.   Musculoskeletal: Negative.   Skin: Negative.   Neurological: Negative.   Endo/Heme/Allergies: Negative.    Psychiatric/Behavioral: Negative.    Maternal Medical History:  Reason for admission: Vaginal bleeding.   Contractions: Onset was yesterday.   Frequency: irregular.   Perceived severity is mild.    Fetal activity: Perceived fetal activity is normal.   Last perceived fetal movement was within the past hour.    Prenatal Complications - Diabetes: none.    Dilation: 2 Effacement (%): 70 Station: -2 Exam by:: Sunday Corn, CNM Blood pressure 126/68, pulse (!) 56, temperature 98.2 F (36.8 C), resp. rate 20, weight 121.6 kg (268 lb), last menstrual period 01/25/2016, SpO2 100 %. Maternal Exam:  Uterine Assessment: Contraction strength is mild.  Contraction frequency is irregular.   Abdomen: Patient reports the following abdominal tenderness: RLQ.  Fetal presentation: vertex  Introitus: Normal vulva. Normal vagina.  Ferning test: not done.  Nitrazine test: not done. Amniotic fluid character: not assessed.  Pelvis: adequate for delivery.   Cervix: Cervix evaluated by digital exam.     Physical Exam  Constitutional: She is oriented to person, place, and time. She appears well-developed and well-nourished.  HENT:  Head: Normocephalic.  Eyes: Pupils are equal, round, and reactive to light.  Neck: Normal range of motion.  Cardiovascular: Normal rate, regular rhythm and normal heart sounds.   Respiratory: Effort normal and breath sounds normal.  GI: Soft. Bowel sounds are normal. There is tenderness in the right lower quadrant.  Genitourinary:  Genitourinary Comments: Uterus: gravid, S>D, cx; 2/70/-2/vtx, no bloody show   Musculoskeletal: Normal range of motion.  Neurological: She is alert and oriented  to person, place, and time. She has normal reflexes.  Skin: Skin is warm and dry.  Psychiatric: She has a normal mood and affect. Her behavior is normal. Judgment and thought content normal.    Results for orders placed or performed during the hospital encounter of 01/01/17 (from  the past 24 hour(s))  Protein / creatinine ratio, urine     Status: Abnormal   Collection Time: 01/01/17  9:30 AM  Result Value Ref Range   Creatinine, Urine 133.00 mg/dL   Total Protein, Urine 42 mg/dL   Protein Creatinine Ratio 0.32 (H) 0.00 - 0.15 mg/mg[Cre]  Urinalysis, Routine w reflex microscopic     Status: Abnormal   Collection Time: 01/01/17  9:31 AM  Result Value Ref Range   Color, Urine YELLOW YELLOW   APPearance TURBID (A) CLEAR   Specific Gravity, Urine 1.023 1.005 - 1.030   pH 7.0 5.0 - 8.0   Glucose, UA NEGATIVE NEGATIVE mg/dL   Hgb urine dipstick MODERATE (A) NEGATIVE   Bilirubin Urine NEGATIVE NEGATIVE   Ketones, ur NEGATIVE NEGATIVE mg/dL   Protein, ur 30 (A) NEGATIVE mg/dL   Nitrite NEGATIVE NEGATIVE   Leukocytes, UA MODERATE (A) NEGATIVE   RBC / HPF 6-30 0 - 5 RBC/hpf   WBC, UA TOO NUMEROUS TO COUNT 0 - 5 WBC/hpf   Bacteria, UA RARE (A) NONE SEEN   Squamous Epithelial / LPF TOO NUMEROUS TO COUNT (A) NONE SEEN   Mucous PRESENT   CBC     Status: Abnormal   Collection Time: 01/01/17  9:42 AM  Result Value Ref Range   WBC 10.2 4.0 - 10.5 K/uL   RBC 4.51 3.87 - 5.11 MIL/uL   Hemoglobin 11.3 (L) 12.0 - 15.0 g/dL   HCT 34.9 (L) 36.0 - 46.0 %   MCV 77.4 (L) 78.0 - 100.0 fL   MCH 25.1 (L) 26.0 - 34.0 pg   MCHC 32.4 30.0 - 36.0 g/dL   RDW 15.7 (H) 11.5 - 15.5 %   Platelets 180 150 - 400 K/uL  Comprehensive metabolic panel     Status: Abnormal   Collection Time: 01/01/17  9:42 AM  Result Value Ref Range   Sodium 138 135 - 145 mmol/L   Potassium 4.3 3.5 - 5.1 mmol/L   Chloride 109 101 - 111 mmol/L   CO2 23 22 - 32 mmol/L   Glucose, Bld 86 65 - 99 mg/dL   BUN 10 6 - 20 mg/dL   Creatinine, Ser 0.65 0.44 - 1.00 mg/dL   Calcium 9.0 8.9 - 10.3 mg/dL   Total Protein 5.9 (L) 6.5 - 8.1 g/dL   Albumin 2.6 (L) 3.5 - 5.0 g/dL   AST 21 15 - 41 U/L   ALT 17 14 - 54 U/L   Alkaline Phosphatase 134 (H) 38 - 126 U/L   Total Bilirubin 0.5 0.3 - 1.2 mg/dL   GFR calc non Af  Amer >60 >60 mL/min   GFR calc Af Amer >60 >60 mL/min   Anion gap 6 5 - 15   Prenatal labs: ABO, Rh: A/Positive/-- (12/06 1522) Antibody: Negative (04/18 0906) Rubella: 2.12 (12/06 1522) RPR: Non Reactive (04/18 0906)  HBsAg: Negative (12/06 1522)  HIV: Non Reactive (04/18 0906)  GBS:   POSITIVE  Assessment/Plan: Mild Preeclampsia - Admit to L&D  - Routine L&D orders - Pitocin 2x2 for IOL - Repeat PIH labs @ 1700  *Dr. Roselie Awkward & Leftwich-Kirby, CNM notified of admission  Laury Deep, MSN, CNM 01/01/2017, 11:31  AM    

## 2017-01-01 NOTE — Anesthesia Procedure Notes (Signed)
Epidural Patient location during procedure: OB  Staffing Anesthesiologist: Montez Hageman Performed: anesthesiologist   Preanesthetic Checklist Completed: patient identified, site marked, surgical consent, pre-op evaluation, timeout performed, IV checked, risks and benefits discussed and monitors and equipment checked  Epidural Patient position: sitting Prep: DuraPrep Patient monitoring: heart rate, continuous pulse ox and blood pressure Approach: right paramedian Location: L3-L4 Injection technique: LOR saline  Needle:  Needle type: Tuohy  Needle gauge: 17 G Needle length: 9 cm and 9 Needle insertion depth: 9 cm Catheter type: closed end flexible Catheter size: 20 Guage Catheter at skin depth: 14 cm Test dose: negative  Assessment Events: blood not aspirated, injection not painful, no injection resistance, negative IV test and no paresthesia  Additional Notes Patient identified. Risks/Benefits/Options discussed with patient including but not limited to bleeding, infection, nerve damage, paralysis, failed block, incomplete pain control, headache, blood pressure changes, nausea, vomiting, reactions to medication both or allergic, itching and postpartum back pain. Confirmed with bedside nurse the patient's most recent platelet count. Confirmed with patient that they are not currently taking any anticoagulation, have any bleeding history or any family history of bleeding disorders. Patient expressed understanding and wished to proceed. All questions were answered. Sterile technique was used throughout the entire procedure. Please see nursing notes for vital signs. Test dose was given through epidural needle and negative prior to continuing to dose epidural or start infusion. Warning signs of high block given to the patient including shortness of breath, tingling/numbness in hands, complete motor block, or any concerning symptoms with instructions to call for help. Patient was given  instructions on fall risk and not to get out of bed. All questions and concerns addressed with instructions to call with any issues.

## 2017-01-01 NOTE — MAU Provider Note (Signed)
History     CSN: [id number]  Arrival date and time: 01/01/17 4098   First Provider Initiated Contact with Patient 01/01/17 1007      Chief Complaint  Patient presents with  . Abdominal Pain  . bloody mucous   HPI  [name] is a 26 yo G4P0030 at 40.[redacted] wks gestation presenting with complaints of bloody mucous d/c and sharp lower abdominal pains.  She reports (+) FM "all 9 months".  She states she had contractions all day yesterday, but occasionally today.  She was dilated 2 cm and soft when seen at [facility] this past Tuesday.  Past Medical History:  Diagnosis Date  . Acid reflux Dx 2011  . Asthma    As a Child  . Bloating 2011  . Weight loss, abnormal     Past Surgical History:  Procedure Laterality Date  . CHOLECYSTECTOMY  2011   Dr.Byerly  . ESOPHAGOGASTRODUODENOSCOPY N/A 10/26/2014   Procedure: ESOPHAGOGASTRODUODENOSCOPY (EGD);  Surgeon: Rogene Houston, MD;  Location: [facility];  Service: Endoscopy;  Laterality: N/A;  . UPPER GASTROINTESTINAL ENDOSCOPY  12/05/2009  . UPPER GASTROINTESTINAL ENDOSCOPY  02/07/2009  . WISDOM TOOTH EXTRACTION    . WISDOM TOOTH EXTRACTION      Family History  Problem Relation Age of Onset  . Hypertension Mother   . Heart disease Father   . Cancer - Cervical Maternal Aunt   . Kidney disease Maternal Grandmother   . Renal Disease Maternal Grandmother     Social History  Substance Use Topics  . Smoking status: Former Smoker    Packs/day: 0.50    Years: 12.00    Types: Cigarettes  . Smokeless tobacco: Never Used  . Alcohol use No    Allergies:  Allergies  Allergen Reactions  . Percocet [Oxycodone-Acetaminophen] Hives and Itching  . Amoxicillin Rash    Has patient had a PCN reaction causing immediate rash, facial/tongue/throat swelling, SOB or lightheadedness with hypotension: Yes Has patient had a PCN reaction causing severe rash involving mucus membranes or skin necrosis: No Has patient had a PCN reaction that required  hospitalization: No Has patient had a PCN reaction occurring within the last 10 years: Yes If all of the above answers are "NO", then may proceed with Cephalosporin use.      Prescriptions Prior to Admission  Medication Sig Dispense Refill Last Dose  . acetaminophen (TYLENOL) 500 MG tablet Take 500 mg by mouth every 6 (six) hours as needed for mild pain or headache.   Past Week at Unknown time  . Doxylamine-Pyridoxine 10-10 MG TBEC 2 PO qhs; may take 1po in am and 1po in afternoon prn nausea (Patient taking differently: Take 2 tablets by mouth at bedtime. 2 PO qhs; may take 1po in am and 1po in afternoon prn nausea) 120 tablet 3 12/31/2016 at Unknown time  . magnesium hydroxide (MILK OF MAGNESIA) 400 MG/5ML suspension Take 5 mLs by mouth daily as needed for mild constipation.   12/31/2016 at Unknown time  . pantoprazole (PROTONIX) 20 MG tablet Take 1 tablet (20 mg total) by mouth daily. 30 tablet 11 Past Week at Unknown time    Review of Systems  Constitutional: Negative.   HENT: Negative.   Eyes: Negative.   Respiratory: Negative.   Cardiovascular: Negative.   Gastrointestinal: Positive for abdominal pain (sharp lower).  Endocrine: Negative.   Genitourinary: Positive for pelvic pain and vaginal bleeding (bloody mucous).  Musculoskeletal: Negative.   Skin: Negative.   Allergic/Immunologic: Negative.  Neurological: Negative.   Hematological: Negative.   Psychiatric/Behavioral: Negative.    Physical Exam   Blood pressure 127/66, pulse 61, temperature 98.2 F (36.8 C), resp. rate 20, weight 121.6 kg (268 lb), last menstrual period 01/25/2016, SpO2 100 %. Patient Vitals for the past 24 hrs:  BP Temp Pulse Resp SpO2 Weight  01/01/17 1015 (!) 143/67 - 68 - - -  01/01/17 1000 127/66 - 61 - - -  01/01/17 0955 126/62 - 78 - - -  01/01/17 0942 (!) 154/80 - 65 - - -  01/01/17 0928 (!) 163/84 98.2 F (36.8 C) 65 20 100 % 121.6 kg (268 lb)    Physical Exam  Constitutional: She is  oriented to person, place, and time. She appears well-developed and well-nourished.  HENT:  Head: Normocephalic.  Eyes: Pupils are equal, round, and reactive to light.  Neck: Normal range of motion.  Cardiovascular: Normal rate, regular rhythm and normal heart sounds.   Respiratory: Effort normal and breath sounds normal.  GI: Soft. Bowel sounds are normal.  Genitourinary:  Genitourinary Comments: Uterus: gravid, S>D, cx; 2/70/-2/soft,no bloody show seen on exam glove   Musculoskeletal: Normal range of motion.  Neurological: She is alert and oriented to person, place, and time. She has normal reflexes.  Skin: Skin is warm and dry.  Psychiatric: She has a normal mood and affect. Her behavior is normal. Judgment and thought content normal.    MAU Course  Procedures  MDM NST - FHR: 130 bpm / 125 variability / accels present / decels absent           TOCO: occ UI with rare UC's CCUA CBC CMP P/C Ratio  Results for orders placed or performed during the hospital encounter of 01/01/17 (from the past 24 hour(s))  Protein / creatinine ratio, urine     Status: Abnormal   Collection Time: 01/01/17  9:30 AM  Result Value Ref Range   Creatinine, Urine 133.00 mg/dL   Total Protein, Urine 42 mg/dL   Protein Creatinine Ratio 0.32 (H) 0.00 - 0.15 mg/mg[Cre]  Urinalysis, Routine w reflex microscopic     Status: Abnormal   Collection Time: 01/01/17  9:31 AM  Result Value Ref Range   Color, Urine YELLOW YELLOW   APPearance TURBID (A) CLEAR   Specific Gravity, Urine 1.023 1.005 - 1.030   pH 7.0 5.0 - 8.0   Glucose, UA NEGATIVE NEGATIVE mg/dL   Hgb urine dipstick MODERATE (A) NEGATIVE   Bilirubin Urine NEGATIVE NEGATIVE   Ketones, ur NEGATIVE NEGATIVE mg/dL   Protein, ur 30 (A) NEGATIVE mg/dL   Nitrite NEGATIVE NEGATIVE   Leukocytes, UA MODERATE (A) NEGATIVE   RBC / HPF 6-30 0 - 5 RBC/hpf   WBC, UA TOO NUMEROUS TO COUNT 0 - 5 WBC/hpf   Bacteria, UA RARE (A) NONE SEEN   Squamous  Epithelial / LPF TOO NUMEROUS TO COUNT (A) NONE SEEN   Mucous PRESENT   CBC     Status: Abnormal   Collection Time: 01/01/17  9:42 AM  Result Value Ref Range   WBC 10.2 4.0 - 10.5 K/uL   RBC 4.51 3.87 - 5.11 MIL/uL   Hemoglobin 11.3 (L) 12.0 - 15.0 g/dL   HCT 34.9 (L) 36.0 - 46.0 %   MCV 77.4 (L) 78.0 - 100.0 fL   MCH 25.1 (L) 26.0 - 34.0 pg   MCHC 32.4 30.0 - 36.0 g/dL   RDW 15.7 (H) 11.5 - 15.5 %   Platelets 180 150 -  400 K/uL  Comprehensive metabolic panel     Status: Abnormal   Collection Time: 01/01/17  9:42 AM  Result Value Ref Range   Sodium 138 135 - 145 mmol/L   Potassium 4.3 3.5 - 5.1 mmol/L   Chloride 109 101 - 111 mmol/L   CO2 23 22 - 32 mmol/L   Glucose, Bld 86 65 - 99 mg/dL   BUN 10 6 - 20 mg/dL   Creatinine, Ser 0.65 0.44 - 1.00 mg/dL   Calcium 9.0 8.9 - 10.3 mg/dL   Total Protein 5.9 (L) 6.5 - 8.1 g/dL   Albumin 2.6 (L) 3.5 - 5.0 g/dL   AST 21 15 - 41 U/L   ALT 17 14 - 54 U/L   Alkaline Phosphatase 134 (H) 38 - 126 U/L   Total Bilirubin 0.5 0.3 - 1.2 mg/dL   GFR calc non Af Amer >60 >60 mL/min   GFR calc Af Amer >60 >60 mL/min   Anion gap 6 5 - 15   Assessment and Plan  Mild Preeclampsia - Admit to L&D - Routine L&D admission orders - Pitocin orders for IOL - Leftwich-Kirby, CNM & Dr. Roselie Awkward notified of admission / care to be assumed by them upon admission  Laury Deep, MSN, CNM 01/01/2017, 10:19 AM

## 2017-01-01 NOTE — Progress Notes (Signed)
Patient ID: Mckenzie Cooley, female   DOB: 01/02/1991, 26 y.o.   MRN: [id number]  Pt comfortable w/ epidural; had AROM @2012   BP 149/79, other VSS FHR 115-120s, +accels, no decels Ctx irreg 2-6 mins Cx 5/50/-2  IUP@term  gHTN IOL process  Begin Pitocin Anticipate SVD  Serita Grammes  CNM 01/01/2017 9:54 PM

## 2017-01-01 NOTE — MAU Note (Signed)
Contractions on and off since yesterday, irregular.  C/o nausea, but no vomiting.  Took MOM last night and having soft stools today.  Having "bloody mucous and feeling pressure."  Not feeling baby move as much as usual this morning.  Also c/o headache and seeing flashes of light.  States BP has been rising.

## 2017-01-01 NOTE — Anesthesia Pain Management Evaluation Note (Signed)
  CRNA Pain Management Visit Note  Patient: Mckenzie Cooley, 26 y.o., female  "Hello I am a member of the anesthesia team at [facility]. We have an anesthesia team available at all times to provide care throughout the hospital, including epidural management and anesthesia for C-section. I don't know your plan for the delivery whether it a natural birth, water birth, IV sedation, nitrous supplementation, doula or epidural, but we want to meet your pain goals."   1.Was your pain managed to your expectations on prior hospitalizations?    No previous hospitalizations.  2.What is your expectation for pain management during this hospitalization?     Epidural  3.How can we help you reach that goal?" Epidural when I am in good labor."  Record the patient's initial score and the patient's pain goal.   Pain: 6  Pain Goal: 6 [facility] wants you to be able to say your pain was always managed very well.  Claudie Rathbone 01/01/2017

## 2017-01-01 NOTE — Anesthesia Preprocedure Evaluation (Addendum)
Anesthesia Evaluation  Patient identified by MRN, date of birth, ID band Patient awake    Reviewed: Allergy & Precautions, H&P , NPO status , Patient's Chart, lab work & pertinent test results  History of Anesthesia Complications Negative for: history of anesthetic complications  Airway Mallampati: II  TM Distance: >3 FB Neck ROM: full    Dental no notable dental hx. (+) Teeth Intact   Pulmonary asthma , former smoker,    Pulmonary exam normal        Cardiovascular negative cardio ROS Normal cardiovascular exam     Neuro/Psych negative neurological ROS  negative psych ROS   GI/Hepatic Neg liver ROS, GERD  Medicated and Controlled,  Endo/Other  Morbid obesity  Renal/GU negative Renal ROS  negative genitourinary   Musculoskeletal   Abdominal   Peds  Hematology  (+) anemia ,   Anesthesia Other Findings Super obese   Reproductive/Obstetrics (+) Pregnancy                                                             Anesthesia Evaluation  Patient identified by MRN, date of birth, ID band Patient awake    Reviewed: Allergy & Precautions, H&P , NPO status , Patient's Chart, lab work & pertinent test results  History of Anesthesia Complications Negative for: history of anesthetic complications  Airway Mallampati: II  TM Distance: >3 FB Neck ROM: full    Dental no notable dental hx. (+) Teeth Intact   Pulmonary asthma , former smoker,    Pulmonary exam normal breath sounds clear to auscultation       Cardiovascular negative cardio ROS Normal cardiovascular exam Rhythm:regular Rate:Normal     Neuro/Psych negative neurological ROS  negative psych ROS   GI/Hepatic negative GI ROS, Neg liver ROS,   Endo/Other  Morbid obesity  Renal/GU negative Renal ROS  negative genitourinary   Musculoskeletal   Abdominal   Peds  Hematology negative hematology ROS (+)    Anesthesia Other Findings   Reproductive/Obstetrics (+) Pregnancy                             Anesthesia Physical Anesthesia Plan  ASA: III  Anesthesia Plan: Epidural   Post-op Pain Management:    Induction:   PONV Risk Score and Plan:   Airway Management Planned:   Additional Equipment:   Intra-op Plan:   Post-operative Plan:   Informed Consent: I have reviewed the patients History and Physical, chart, labs and discussed the procedure including the risks, benefits and alternatives for the proposed anesthesia with the patient or authorized representative who has indicated his/her understanding and acceptance.     Plan Discussed with:   Anesthesia Plan Comments:         Anesthesia Quick Evaluation  Anesthesia Physical Anesthesia Plan  ASA: III  Anesthesia Plan: Epidural   Post-op Pain Management:    Induction:   PONV Risk Score and Plan: 2 and Ondansetron and Treatment may vary due to age or medical condition  Airway Management Planned: Simple Face Mask  Additional Equipment:   Intra-op Plan:   Post-operative Plan:   Informed Consent: I have reviewed the patients History and Physical, chart, labs and discussed the procedure including the risks, benefits and alternatives for  the proposed anesthesia with the patient or authorized representative who has indicated his/her understanding and acceptance.     Plan Discussed with: CRNA  Anesthesia Plan Comments:        Anesthesia Quick Evaluation

## 2017-01-01 NOTE — Progress Notes (Signed)
Patient requesting to be off [place] for a few more minutes because they are uncomfortable and itching.  FHT reactive prior to coming off for bathroom break.  Will give a few more minutes off monitors.

## 2017-01-02 ENCOUNTER — Encounter (HOSPITAL_COMMUNITY): Payer: Self-pay

## 2017-01-02 ENCOUNTER — Encounter (HOSPITAL_COMMUNITY)
Admission: AD | Disposition: A | Discharge status: Home / Self Care | Payer: Self-pay | Source: Ambulatory Visit | Attending: Obstetrics and Gynecology

## 2017-01-02 DIAGNOSIS — Z3A40 40 weeks gestation of pregnancy: Secondary | ICD-10-CM

## 2017-01-02 LAB — RPR: RPR Ser Ql: NONREACTIVE

## 2017-01-02 SURGERY — Surgical Case
Anesthesia: Epidural

## 2017-01-02 MED ORDER — LACTATED RINGERS IV SOLN
125.0000 mL/h | INTRAVENOUS | Status: DC
  Administered 2017-01-02: 18:00:00 via INTRAVENOUS

## 2017-01-02 MED ORDER — COCONUT OIL OIL: 1.0000 "application " | TOPICAL_OIL | Status: DC | PRN

## 2017-01-02 MED ORDER — WITCH HAZEL-GLYCERIN EX PADS: 1.0000 "application " | MEDICATED_PAD | CUTANEOUS | Status: DC | PRN

## 2017-01-02 MED ORDER — PRENATAL MULTIVITAMIN CH
1.0000 | ORAL_TABLET | Freq: Every day | ORAL | Status: DC
  Administered 2017-01-03 – 2017-01-05 (×3): 1 via ORAL
  Filled 2017-01-02 (×3): qty 1

## 2017-01-02 MED ORDER — SIMETHICONE 80 MG PO CHEW
80.0000 mg | CHEWABLE_TABLET | Freq: Three times a day (TID) | ORAL | Status: DC
  Administered 2017-01-03 – 2017-01-04 (×5): 80 mg via ORAL
  Filled 2017-01-02 (×6): qty 1

## 2017-01-02 MED ORDER — OXYCODONE HCL 5 MG PO TABS: 10.0000 mg | ORAL_TABLET | ORAL | Status: DC | PRN

## 2017-01-02 MED ORDER — DIBUCAINE 1 % RE OINT: 1.0000 "application " | TOPICAL_OINTMENT | RECTAL | Status: DC | PRN

## 2017-01-02 MED ORDER — FENTANYL CITRATE (PF) 100 MCG/2ML IJ SOLN
INTRAMUSCULAR | Status: AC
  Filled 2017-01-02: qty 2

## 2017-01-02 MED ORDER — MORPHINE SULFATE (PF) 0.5 MG/ML IJ SOLN
INTRAMUSCULAR | Status: AC
  Filled 2017-01-02: qty 10

## 2017-01-02 MED ORDER — OXYCODONE HCL 5 MG PO TABS: 5.0000 mg | ORAL_TABLET | ORAL | Status: DC | PRN

## 2017-01-02 MED ORDER — HYDROMORPHONE HCL 1 MG/ML IJ SOLN: 0.2500 mg | INTRAMUSCULAR | Status: DC | PRN

## 2017-01-02 MED ORDER — FUROSEMIDE 10 MG/ML IJ SOLN
INTRAMUSCULAR | Status: AC
  Filled 2017-01-02: qty 2

## 2017-01-02 MED ORDER — EPHEDRINE SULFATE 50 MG/ML IJ SOLN
INTRAMUSCULAR | Status: DC | PRN
  Administered 2017-01-02 (×2): 5 mg via INTRAVENOUS

## 2017-01-02 MED ORDER — DIPHENHYDRAMINE HCL 25 MG PO CAPS: 25.0000 mg | ORAL_CAPSULE | Freq: Four times a day (QID) | ORAL | Status: DC | PRN

## 2017-01-02 MED ORDER — SCOPOLAMINE 1 MG/3DAYS TD PT72
MEDICATED_PATCH | TRANSDERMAL | Status: DC | PRN
  Administered 2017-01-02: 1 via TRANSDERMAL

## 2017-01-02 MED ORDER — PROMETHAZINE HCL 25 MG/ML IJ SOLN: 6.2500 mg | INTRAMUSCULAR | Status: DC | PRN

## 2017-01-02 MED ORDER — FAMOTIDINE IN NACL 20-0.9 MG/50ML-% IV SOLN
20.0000 mg | Freq: Once | INTRAVENOUS | Status: AC
  Administered 2017-01-02: 20 mg via INTRAVENOUS
  Filled 2017-01-02: qty 50

## 2017-01-02 MED ORDER — LACTATED RINGERS IV SOLN
INTRAVENOUS | Status: DC
Start: 2017-01-02 — End: 2017-01-05
  Administered 2017-01-03: 12:00:00 via INTRAVENOUS
  Administered 2017-01-03: 999 mL via INTRAVENOUS

## 2017-01-02 MED ORDER — SODIUM BICARBONATE 8.4 % IV SOLN
INTRAVENOUS | Status: DC | PRN
  Administered 2017-01-02: 3 mL via EPIDURAL
  Administered 2017-01-02: 5 mL via EPIDURAL
  Administered 2017-01-02: 2 mL via EPIDURAL
  Administered 2017-01-02: 3 mL via EPIDURAL
  Administered 2017-01-02: 5 mL via EPIDURAL
  Administered 2017-01-02: 2 mL via EPIDURAL

## 2017-01-02 MED ORDER — SCOPOLAMINE 1 MG/3DAYS TD PT72
MEDICATED_PATCH | TRANSDERMAL | Status: AC
  Filled 2017-01-02: qty 1

## 2017-01-02 MED ORDER — FUROSEMIDE 10 MG/ML IJ SOLN
INTRAMUSCULAR | Status: DC | PRN
  Administered 2017-01-02: 10 mg via INTRAMUSCULAR

## 2017-01-02 MED ORDER — LACTATED RINGERS IV SOLN
INTRAVENOUS | Status: DC | PRN
  Administered 2017-01-02: 17:00:00 via INTRAVENOUS

## 2017-01-02 MED ORDER — ACETAMINOPHEN 325 MG PO TABS
650.0000 mg | ORAL_TABLET | ORAL | Status: DC | PRN
  Administered 2017-01-03 – 2017-01-05 (×8): 650 mg via ORAL
  Filled 2017-01-02 (×8): qty 2

## 2017-01-02 MED ORDER — PHENYLEPHRINE HCL 10 MG/ML IJ SOLN
INTRAMUSCULAR | Status: DC | PRN
  Administered 2017-01-02: 80 ug via INTRAVENOUS
  Administered 2017-01-02 (×4): 40 ug via INTRAVENOUS

## 2017-01-02 MED ORDER — SOD CITRATE-CITRIC ACID 500-334 MG/5ML PO SOLN: 30.0000 mL | ORAL | Status: DC

## 2017-01-02 MED ORDER — KETOROLAC TROMETHAMINE 30 MG/ML IJ SOLN
INTRAMUSCULAR | Status: DC | PRN
  Administered 2017-01-02: 30 mg via INTRAVENOUS

## 2017-01-02 MED ORDER — TETANUS-DIPHTH-ACELL PERTUSSIS 5-2.5-18.5 LF-MCG/0.5 IM SUSP: 0.5000 mL | Freq: Once | INTRAMUSCULAR | Status: DC

## 2017-01-02 MED ORDER — MENTHOL 3 MG MT LOZG: 1.0000 | LOZENGE | OROMUCOSAL | Status: DC | PRN

## 2017-01-02 MED ORDER — SENNOSIDES-DOCUSATE SODIUM 8.6-50 MG PO TABS
2.0000 | ORAL_TABLET | ORAL | Status: DC
  Administered 2017-01-04 (×2): 2 via ORAL
  Filled 2017-01-02 (×2): qty 2

## 2017-01-02 MED ORDER — DEXAMETHASONE SODIUM PHOSPHATE 10 MG/ML IJ SOLN
INTRAMUSCULAR | Status: DC | PRN
  Administered 2017-01-02: 4 mg via INTRAVENOUS

## 2017-01-02 MED ORDER — OXYTOCIN 40 UNITS IN LACTATED RINGERS INFUSION - SIMPLE MED: 2.5000 [IU]/h | INTRAVENOUS | Status: AC

## 2017-01-02 MED ORDER — PHENYLEPHRINE 40 MCG/ML (10ML) SYRINGE FOR IV PUSH (FOR BLOOD PRESSURE SUPPORT)
PREFILLED_SYRINGE | INTRAVENOUS | Status: AC
  Filled 2017-01-02: qty 10

## 2017-01-02 MED ORDER — KETOROLAC TROMETHAMINE 30 MG/ML IJ SOLN
INTRAMUSCULAR | Status: AC
  Filled 2017-01-02: qty 1

## 2017-01-02 MED ORDER — ONDANSETRON HCL 4 MG/2ML IJ SOLN
INTRAMUSCULAR | Status: AC
  Filled 2017-01-02: qty 2

## 2017-01-02 MED ORDER — ONDANSETRON HCL 4 MG/2ML IJ SOLN
INTRAMUSCULAR | Status: DC | PRN
  Administered 2017-01-02: 4 mg via INTRAVENOUS

## 2017-01-02 MED ORDER — SIMETHICONE 80 MG PO CHEW
80.0000 mg | CHEWABLE_TABLET | ORAL | Status: DC | PRN
  Administered 2017-01-05: 80 mg via ORAL

## 2017-01-02 MED ORDER — DEXTROSE 5 % IV SOLN
3.0000 g | Freq: Once | INTRAVENOUS | Status: AC
  Administered 2017-01-02: 3 g via INTRAVENOUS
  Filled 2017-01-02: qty 3

## 2017-01-02 MED ORDER — IBUPROFEN 600 MG PO TABS
600.0000 mg | ORAL_TABLET | Freq: Four times a day (QID) | ORAL | Status: DC
  Administered 2017-01-03 – 2017-01-05 (×10): 600 mg via ORAL
  Filled 2017-01-02 (×11): qty 1

## 2017-01-02 MED ORDER — OXYTOCIN 10 UNIT/ML IJ SOLN
INTRAVENOUS | Status: DC | PRN
  Administered 2017-01-02: 40 [IU] via INTRAVENOUS

## 2017-01-02 MED ORDER — ZOLPIDEM TARTRATE 5 MG PO TABS: 5.0000 mg | ORAL_TABLET | Freq: Every evening | ORAL | Status: DC | PRN

## 2017-01-02 MED ORDER — FENTANYL CITRATE (PF) 100 MCG/2ML IJ SOLN
INTRAMUSCULAR | Status: DC | PRN
  Administered 2017-01-02: 50 ug via INTRAVENOUS
  Administered 2017-01-02: 50 ug via EPIDURAL

## 2017-01-02 MED ORDER — DEXTROSE 5 % IV SOLN
500.0000 mg | Freq: Once | INTRAVENOUS | Status: AC
  Administered 2017-01-02: 500 mg via INTRAVENOUS
  Filled 2017-01-02: qty 500

## 2017-01-02 MED ORDER — OXYTOCIN 10 UNIT/ML IJ SOLN
INTRAMUSCULAR | Status: AC
  Filled 2017-01-02: qty 4

## 2017-01-02 MED ORDER — SIMETHICONE 80 MG PO CHEW
80.0000 mg | CHEWABLE_TABLET | ORAL | Status: DC
  Administered 2017-01-04 (×2): 80 mg via ORAL
  Filled 2017-01-02 (×2): qty 1

## 2017-01-02 SURGICAL SUPPLY — 44 items
APL SKNCLS STERI-STRIP NONHPOA (GAUZE/BANDAGES/DRESSINGS) ×1
BENZOIN TINCTURE PRP APPL 2/3 (GAUZE/BANDAGES/DRESSINGS) ×2 IMPLANT
CHLORAPREP W/TINT 26ML (MISCELLANEOUS) ×2 IMPLANT
CLAMP CORD UMBIL (MISCELLANEOUS) IMPLANT
CLOTH BEACON ORANGE TIMEOUT ST (SAFETY) ×2 IMPLANT
DRAPE C SECTION CLR SCREEN (DRAPES) ×2 IMPLANT
DRESSING DISP NPWT PICO 4X12 (MISCELLANEOUS) ×2 IMPLANT
DRSG OPSITE POSTOP 4X10 (GAUZE/BANDAGES/DRESSINGS) ×2 IMPLANT
ELECT REM PT RETURN 9FT ADLT (ELECTROSURGICAL) ×2
ELECTRODE REM PT RTRN 9FT ADLT (ELECTROSURGICAL) ×1 IMPLANT
EXTRACTOR VACUUM M CUP 4 TUBE (SUCTIONS) IMPLANT
GLOVE BIO SURGEON STRL SZ7.5 (GLOVE) ×2 IMPLANT
GLOVE BIOGEL PI IND STRL 7.0 (GLOVE) ×1 IMPLANT
GLOVE BIOGEL PI INDICATOR 7.0 (GLOVE) ×1
GOWN STRL REUS W/TWL 2XL LVL3 (GOWN DISPOSABLE) ×2 IMPLANT
GOWN STRL REUS W/TWL LRG LVL3 (GOWN DISPOSABLE) ×4 IMPLANT
HOVERMATT SINGLE USE (MISCELLANEOUS) ×2 IMPLANT
KIT ABG SYR 3ML LUER SLIP (SYRINGE) IMPLANT
NEEDLE HYPO 22GX1.5 SAFETY (NEEDLE) ×2 IMPLANT
NEEDLE HYPO 25X5/8 SAFETYGLIDE (NEEDLE) IMPLANT
NS IRRIG 1000ML POUR BTL (IV SOLUTION) ×2 IMPLANT
PACK C SECTION WH (CUSTOM PROCEDURE TRAY) ×2 IMPLANT
PAD OB MATERNITY 4.3X12.25 (PERSONAL CARE ITEMS) ×2 IMPLANT
PENCIL SMOKE EVAC W/HOLSTER (ELECTROSURGICAL) ×2 IMPLANT
RETRACTOR TRAXI PANNICULUS (MISCELLANEOUS) ×1 IMPLANT
RTRCTR C-SECT PINK 25CM LRG (MISCELLANEOUS) ×2 IMPLANT
SPONGE LAP 18X18 X RAY DECT (DISPOSABLE) ×6 IMPLANT
STRIP CLOSURE SKIN 1/2X4 (GAUZE/BANDAGES/DRESSINGS) ×2 IMPLANT
SUT CHROMIC 1 CTX 36 (SUTURE) ×6 IMPLANT
SUT VIC AB 1 CT1 27 (SUTURE) ×4
SUT VIC AB 1 CT1 27XBRD ANTBC (SUTURE) ×2 IMPLANT
SUT VIC AB 2-0 CT1 (SUTURE) ×2 IMPLANT
SUT VIC AB 2-0 CT1 27 (SUTURE) ×1
SUT VIC AB 2-0 CT1 TAPERPNT 27 (SUTURE) ×1 IMPLANT
SUT VIC AB 3-0 CT1 27 (SUTURE) ×2
SUT VIC AB 3-0 CT1 TAPERPNT 27 (SUTURE) ×2 IMPLANT
SUT VIC AB 3-0 SH 27 (SUTURE)
SUT VIC AB 3-0 SH 27X BRD (SUTURE) IMPLANT
SUT VIC AB 4-0 KS 27 (SUTURE) ×2 IMPLANT
SYR BULB IRRIGATION 50ML (SYRINGE) ×2 IMPLANT
SYR CONTROL 10ML LL (SYRINGE) ×2 IMPLANT
TOWEL OR 17X24 6PK STRL BLUE (TOWEL DISPOSABLE) ×2 IMPLANT
TRAXI PANNICULUS RETRACTOR (MISCELLANEOUS) ×1
TRAY FOLEY BAG SILVER LF 14FR (SET/KITS/TRAYS/PACK) ×2 IMPLANT

## 2017-01-02 NOTE — Progress Notes (Signed)
Mckenzie Cooley is a 26 y.o. G4P0030 at [redacted]w[redacted]d by LMP admitted for induction of labor due to gestational hypertension.  Subjective: Pt is comfortable with epidural  Objective: BP (!) 158/85   Pulse 70   Temp 98.5 F (36.9 C) (Oral)   Resp 18   Ht 5' (1.524 m)   Wt 268 lb (121.6 kg)   LMP 01/25/2016 (LMP Unknown)   SpO2 100%   BMI 52.34 kg/m  I/O last 3 completed shifts: In: -  Out: 425 [Urine:425] No intake/output data recorded.  FHT:  FHR: 125 bpm, variability: moderate,  accelerations:  Present,  decelerations:  Absent UC:   regular, every 3 minutes SVE:   Dilation: 8 Effacement (%): 100 Station: 0 Exam by:: Dr. Elonda Husky   Labs: Lab Results  Component Value Date   WBC 12.8 (H) 01/01/2017   HGB 11.2 (L) 01/01/2017   HCT 34.9 (L) 01/01/2017   MCV 77.0 (L) 01/01/2017   PLT 170 01/01/2017    Assessment / Plan: labor progressing well at this point now that has thinned out  Labor: Progressing normally Preeclampsia:   Fetal Wellbeing:  Category I Pain Control:  Epidural I/D:   Anticipated MOD:  NSVD  EURE,LUTHER H 01/02/2017, 8:21 AM

## 2017-01-02 NOTE — Progress Notes (Signed)
LABOR PROGRESS NOTE  Mckenzie Cooley is a 26 y.o. G4P0030 at [redacted]w[redacted]d  admitted for IOL for GHTN.  Subjective: In to check on patient for cervical change. Patient doing well. Pain well controlled with epidural.   Objective: BP (!) 156/77   Pulse 64   Temp 98.7 F (37.1 C) (Oral)   Resp 18   Ht 5' (1.524 m)   Wt 268 lb (121.6 kg)   LMP 01/25/2016 (LMP Unknown)   SpO2 100%   BMI 52.34 kg/m  or  Vitals:   01/02/17 1130 01/02/17 1132 01/02/17 1200 01/02/17 1230  BP:  135/61 (!) 114/92 (!) 156/77  Pulse:  85 67 64  Resp: 20  18 18   Temp:    98.7 F (37.1 C)  TempSrc:    Oral  SpO2:      Weight:      Height:        NAD Dilation: 6.5 Effacement (%): 90 Cervical Position: Anterior Station: 0, +1 Presentation: Vertex Exam by:: Dr. Vanetta Shawl FHT: baseline rate 140, moderate varibility, +acel, variable decel Toco: every 3 min  Labs:  Assessment / Plan: 26 y.o. G4P0030 at [redacted]w[redacted]d here for IOL for GHTN  Labor: On Pitocin, has not had cervical change for at least 6 hours. Cervix now also edematous and measuring 5-7 from prior exam of 8cm despite adequate MVUs. Discussed possibility of C/S with patient given arrest of dilation. Will continue current management for now and recheck patient in another 1-2 hours, if no change then will like proceed with c/s.  Fetal Wellbeing:  Cat I Pain Control:  Well controlled with epidural Anticipated MOD:  Likely C/S.   Gailen Shelter, MD 01/02/2017, 1:01 PM

## 2017-01-02 NOTE — Progress Notes (Addendum)
Notified pharmacy Tamela Oddi) and resident Everrett Coombe) about concern over pain medications ordered--oxy/ir and tylenol--as patient shows allergy to percocet (hives and itching).  Dr Burr Medico is checking   Into it..  No new orders at this time.  Jtwells, rn  Patient said Oxycodone that caused reaction of hives and itching

## 2017-01-02 NOTE — Transfer of Care (Signed)
Immediate Anesthesia Transfer of Care Note  Patient: Mckenzie Cooley  Procedure(s) Performed: Procedure(s): CESAREAN SECTION (N/A)  Patient Location: PACU  Anesthesia Type:Epidural  Level of Consciousness: awake and alert   Airway & Oxygen Therapy: Patient Spontanous Breathing  Post-op Assessment: Report given to RN and Post -op Vital signs reviewed and stable  Post vital signs: Reviewed  Last Vitals:  Vitals:   01/02/17 1730 01/02/17 1943  BP:  137/62  Pulse:  (!) 102  Resp: 20 16  Temp:  36.7 C    Last Pain:  Vitals:   01/02/17 1730  TempSrc:   PainSc: 5       Patients Stated Pain Goal: 3 (83/77/93 9688)  Complications: No apparent anesthesia complications and respiratory complications

## 2017-01-02 NOTE — Op Note (Signed)
Cesarean Section Operative Report  PATIENT NAME: Mckenzie Cooley  PROCEDURE DATE: 01/02/2017  PREOPERATIVE DIAGNOSES: Intrauterine pregnancy at [redacted]w[redacted]d weeks gestation; failure to progress: arrest of dilation  POSTOPERATIVE DIAGNOSES: The same  PROCEDURE: Primary Low Transverse Cesarean Section  SURGEON:   Surgeon(s) and Role:    * Chancy Milroy, MD - Primary - Attending    * Mumaw, Lauralyn Primes, DO    * Crissie Figures, MD - OB Fellow  ASSISTANT:  Crissie Figures, MD - OB Fellow   INDICATIONS: CHERAL Cooley is a 26 y.o. [phone] at [redacted]w[redacted]d here for cesarean section secondary to the indications listed under preoperative diagnoses; please see preoperative note for further details.  The risks of cesarean section were discussed with the patient including but were not limited to: bleeding which may require transfusion or reoperation; infection which may require antibiotics; injury to bowel, bladder, ureters or other surrounding organs; injury to the fetus; need for additional procedures including hysterectomy in the event of a life-threatening hemorrhage; placental abnormalities wth subsequent pregnancies, incisional problems, thromboembolic phenomenon and other postoperative/anesthesia complications.   The patient concurred with the proposed plan, giving informed written consent for the procedure.    FINDINGS:  Viable female infant in cephalic presentation.  Apgars 8 and 9.  Clear amniotic fluid.  Intact placenta, three vessel cord.  Normal uterus, fallopian tubes and ovaries bilaterally.  ANESTHESIA: Epidural INTRAVENOUS FLUIDS: 2200 ml ESTIMATED BLOOD LOSS: 800 ml URINE OUTPUT:  300 ml SPECIMENS: Placenta sent to pathology COMPLICATIONS: None immediate  PROCEDURE IN DETAIL:  The patient preoperatively received intravenous antibiotics and had sequential compression devices applied to her lower extremities.  She was then taken to the operating room where the epidural anesthesia was dosed up  to surgical level and was found to be adequate. She was then placed in a dorsal supine position with a leftward tilt, and prepped and draped in a sterile manner.  A foley catheter was placed into her bladder and attached to constant gravity.    After an adequate timeout was performed, a Pfannenstiel skin incision was made with scalpel and carried through to the underlying layer of fascia. The fascia was incised in the midline, and this incision was extended bilaterally using the Mayo scissors.  Kocher clamps were applied to the superior aspect of the fascial incision and the underlying rectus muscles were dissected off bluntly.  A similar process was carried out on the inferior aspect of the fascial incision. The rectus muscles were separated in the midline bluntly and the peritoneum was entered bluntly. Attention was turned to the lower uterine segment where a low transverse hysterotomy was made with a scalpel and extended bilaterally bluntly.  The infant was successfully delivered, the cord was clamped and cut after one minute, and the infant was handed over to the awaiting neonatology team. Uterine massage was then administered, and the placenta delivered intact with a three-vessel cord. The uterus was then cleared of clots and debris.  The hysterotomy was closed with 0 Chromic in a running locked fashion, and an imbricating layer was also placed with 0 Chromic.  Figure-of-eight 0 Chromic serosal stitches were placed to help with hemostasis. The pelvis was cleared of all clot and debris. Hemostasis was confirmed on all surfaces.  The peritoneum and rectus muscles were closed with a 0 Chromic running stitch. The fascia was then closed using 1 Vicryl. The subcutaneous layer was irrigated, then reapproximated with 2-0 plain gut interrupted stitches. The skin was closed with  a 4-0 Vicryl subcuticular stitch. The skin incision was dressed with PICO wound vac.   The patient tolerated the procedure well. Sponge,  lap, instrument and needle counts were correct x 3.  She was taken to the recovery room in stable condition.    Disposition: PACU - hemodynamically stable.   Maternal Condition: stable    Signed: Gailen Shelter, MD OB Fellow 01/02/2017 08:40 PM   I was present, gloved and gowned during the procedure.  Arlina Robes, MD OB/GYN Faculty

## 2017-01-02 NOTE — Progress Notes (Signed)
Patient seen & examined for progress of labor. Patient comfortable with epidural, but feeling pressure with contractions.  6/60/-2. Category I tracing. Continue to inc pit per protocol.   Continue expectant management Anticipate SVD  Everrett Coombe, MD PGY-2 [facility]

## 2017-01-02 NOTE — Progress Notes (Signed)
Patient ID: Mckenzie Cooley, female   DOB: 03-12-1991, 26 y.o.   MRN: [id number] Labor Progress Note  S: Patient seen & examined for progress of labor. Patient comfortable with epidural.   O: BP (!) 159/83   Pulse 67   Temp 98.5 F (36.9 C) (Oral)   Resp (!) 22   Ht 5' (1.524 m)   Wt 121.6 kg (268 lb)   LMP 01/25/2016 (LMP Unknown)   SpO2 100%   BMI 52.34 kg/m   FHT: 125bpm, mod var, +accels, no decels TOCO: q1-34min, patient looks comfortable during contractions  CVE: Dilation: 8 Effacement (%): 100 Cervical Position: Anterior Station: 0, +1 Presentation: Vertex Exam by:: Mckenzie Shores, RN  A&P: 26 y.o. [id number] [redacted]w[redacted]d IOL for GHTN  Currently on 22milli-units of pitocin Continue current management Anticipate SVD  [place] Janellie Tennison, DO FM Resident PGY-1 01/02/2017 10:34 AM

## 2017-01-03 ENCOUNTER — Encounter (HOSPITAL_COMMUNITY): Payer: Self-pay | Admitting: Obstetrics and Gynecology

## 2017-01-03 LAB — CBC
HEMATOCRIT: 21.5 % — AB (ref 36.0–46.0)
Hemoglobin: 7.1 g/dL — ABNORMAL LOW (ref 12.0–15.0)
MCH: 25.2 pg — ABNORMAL LOW (ref 26.0–34.0)
MCHC: 33 g/dL (ref 30.0–36.0)
MCV: 76.2 fL — AB (ref 78.0–100.0)
Platelets: 187 10*3/uL (ref 150–400)
RBC: 2.82 MIL/uL — AB (ref 3.87–5.11)
RDW: 15.9 % — AB (ref 11.5–15.5)
WBC: 26.7 10*3/uL — AB (ref 4.0–10.5)

## 2017-01-03 MED ORDER — FUROSEMIDE 10 MG/ML IJ SOLN
40.0000 mg | Freq: Once | INTRAMUSCULAR | Status: AC
  Administered 2017-01-03: 40 mg via INTRAVENOUS
  Filled 2017-01-03: qty 4

## 2017-01-03 NOTE — Progress Notes (Signed)
Patient was assisted OOB to ambulate to the bathroom for Foley care and peri-care. She ambulated very slowly and labored, with substantial support needed. She complained of stiffness and severe throbbing discomfort in her legs due to the significant edema in her perineum and lower extremities. Urine output 200 cc. Taking minimal PO fluids. LR infusing at 125 cc/hour.

## 2017-01-03 NOTE — Anesthesia Postprocedure Evaluation (Signed)
Anesthesia Post Note  Patient: Mckenzie Cooley  Procedure(s) Performed: Procedure(s) (LRB): CESAREAN SECTION (N/A)     Patient location during evaluation: Mother Baby Anesthesia Type: Epidural Level of consciousness: awake and alert and oriented Pain management: pain level controlled Vital Signs Assessment: post-procedure vital signs reviewed and stable Respiratory status: spontaneous breathing and nonlabored ventilation Cardiovascular status: stable Postop Assessment: no headache, patient able to bend at knees, no backache, no signs of nausea or vomiting, epidural receding and adequate PO intake Anesthetic complications: no    Last Vitals:  Vitals:   01/03/17 0152 01/03/17 0606  BP:  100/85  Pulse:  (!) 104  Resp: 16 16  Temp: 37.2 C 37.2 C    Last Pain:  Vitals:   01/03/17 0610  TempSrc:   PainSc: 1    Pain Goal: Patients Stated Pain Goal: 3 (01/02/17 0730)               Jabier Mutton

## 2017-01-03 NOTE — Progress Notes (Addendum)
Subjective: Postpartum Day 1: Cesarean Delivery Patient reports tolerating PO.  States has no pain. RN was concerned about her allergy to narcotics but pt has not required any all night, has PICO in situ  Objective: Vital signs in last 24 hours: Temp:  [97.9 F (36.6 C)-99.1 F (37.3 C)] 98.9 F (37.2 C) (07/15 0606) Pulse Rate:  [64-130] 104 (07/15 0606) Resp:  [13-22] 16 (07/15 0606) BP: (100-185)/(61-103) 100/85 (07/15 0606) SpO2:  [94 %-100 %] 95 % (07/15 0606)  Physical Exam:  General: alert, cooperative and no distress Lochia: appropriate Uterine Fundus: firm Incision: no significant drainage DVT Evaluation: No evidence of DVT seen on physical exam.   Recent Labs  01/01/17 1706 01/03/17 0519  HGB 11.2* 7.1*  HCT 34.9* 21.5*    Assessment/Plan: Status post Cesarean section. Doing well postoperatively.  Continue current care.  Hansel Feinstein 01/03/2017, 7:52 AM

## 2017-01-03 NOTE — Anesthesia Postprocedure Evaluation (Signed)
Anesthesia Post Note  Patient: Mckenzie Cooley  Procedure(s) Performed: Procedure(s) (LRB): CESAREAN SECTION (N/A)     Patient location during evaluation: PACU Anesthesia Type: Epidural Level of consciousness: awake and alert Pain management: pain level controlled Vital Signs Assessment: post-procedure vital signs reviewed and stable Respiratory status: spontaneous breathing, nonlabored ventilation and respiratory function stable Cardiovascular status: stable Postop Assessment: no headache, no backache and epidural receding Anesthetic complications: no    Last Vitals:  Vitals:   01/02/17 2350 01/03/17 0152  BP: 116/82   Pulse: 94   Resp:    Temp: 37.2 C 37.2 C    Last Pain:  Vitals:   01/02/17 2350  TempSrc: Oral  PainSc: 1    Pain Goal: Patients Stated Pain Goal: 3 (01/02/17 0730)               Thurmond Butts P Ellender

## 2017-01-03 NOTE — Lactation Note (Signed)
This note was copied from a baby's chart. Lactation Consultation Note  Patient Name: Mckenzie Cooley Today's Date: 01/03/2017 Reason for consult: Initial assessment Baby at 54 hr of life. Mom has been bottle feeding formula because "the baby want latch, no one will help me, and the pump does not work". Mom stated she wants to latch baby but does not think the baby likes to bf. The visitor in the room stated mom wants to pump to feed. Mom has a Harmony in the room but has only used it twice since birth. Mom has large breast with flat nipples. Mom does not move because she stated she is in pain and "my legs don't work right". Placed baby in football on the L breast with a #20 NS. Baby latched easily and comfortably. Discussed baby behavior, feeding frequency, supplementing risks, pumping, baby belly size, voids, wt loss, breast changes, and nipple care. Demonstrated manual expression, colostrum noted bilaterally, spoon in room. Given lactation handouts. Aware of OP services and support group. Mom will latch baby on demand 8+/24hr, if baby does not latch she will pump. Mom will offer expressed milk or formula per volume guidelines. If mom is not providing breast milk she will change the formula. Report given to RN.     Maternal Data Has patient been taught Hand Expression?: Yes  Feeding Feeding Type: Breast Fed Length of feed: 15 min  LATCH Score/Interventions Latch: Grasps breast easily, tongue down, lips flanged, rhythmical sucking. Intervention(s): Skin to skin;Teach feeding cues  Audible Swallowing: A few with stimulation Intervention(s): Skin to skin;Hand expression  Type of Nipple: Flat  Comfort (Breast/Nipple): Soft / non-tender     Hold (Positioning): Full assist, staff holds infant at breast Intervention(s): Position options;Support Pillows  LATCH Score: 6  Lactation Tools Discussed/Used Tools: Nipple Shields Nipple shield size: 20 WIC Program: Yes Pump Review: Setup,  frequency, and cleaning;Milk Storage;Other (comment) (pump settings) Initiated by:: ES Date initiated:: 01/03/17   Consult Status Consult Status: Follow-up Date: 01/04/17 Follow-up type: In-patient    Denzil Hughes 01/03/2017, 12:18 PM

## 2017-01-04 MED ORDER — HYDROMORPHONE HCL 2 MG PO TABS: 1.0000 mg | ORAL_TABLET | ORAL | Status: DC | PRN

## 2017-01-04 MED ORDER — ONDANSETRON 4 MG PO TBDP
4.0000 mg | ORAL_TABLET | Freq: Three times a day (TID) | ORAL | Status: DC | PRN
  Administered 2017-01-04: 4 mg via ORAL
  Filled 2017-01-04 (×2): qty 1

## 2017-01-04 MED ORDER — PROMETHAZINE HCL 25 MG PO TABS
25.0000 mg | ORAL_TABLET | Freq: Once | ORAL | Status: AC
  Administered 2017-01-04: 25 mg via ORAL
  Filled 2017-01-04: qty 1

## 2017-01-04 MED ORDER — FERROUS FUMARATE 324 (106 FE) MG PO TABS
1.0000 | ORAL_TABLET | Freq: Two times a day (BID) | ORAL | Status: DC
  Administered 2017-01-04 (×2): 106 mg via ORAL
  Filled 2017-01-04 (×5): qty 1

## 2017-01-04 NOTE — Plan of Care (Signed)
Problem: Activity: Goal: Ability to tolerate increased activity will improve Outcome: Completed/Met Date Met: 01/04/17 Pt ambulating frequently in room without assistance. Pt reports pain when standing up or sitting down but reports it improves the more she moves.  Pt encouraged to ambulate in the hallways.

## 2017-01-04 NOTE — Progress Notes (Signed)
Initial visit to introduce spiritual care services and offer support during pt's hospital stay. Mckenzie Cooley shared that she had to have a c-section to have Scientific laboratory technician and shared that it was "awful."  She reports Mckenzie Cooley is her miracle baby.  We spent some time discussing her three prior miscarriages and the ostricization she's felt from friends because she didn't have a child.  I shared information about the women's hospital support groups and the benefits connecting with other moms can have in the early days of motherhood.  Please page as further needs arise.  Donald Prose. Elyn Peers, M.Div. [facility] Chaplain Pager [phone] Office [phone]

## 2017-01-04 NOTE — Lactation Note (Signed)
This note was copied from a baby's chart. Lactation Consultation Note  Patient Name: Mckenzie Cooley [id number] Date: 01/04/2017 Reason for consult: Follow-up assessment;Hyperbilirubinemia  Baby 21 hours old. Mom reports that she just finished supplementing baby, but would like assistance with latching at next feeding. Enc mom to call for assistance at next feeding. Enc mom to keep putting baby to breast, then supplement, and then post-pump followed by hand expression. Enc waking the baby if baby not cueing by 3 hours.  Maternal Data    Feeding    LATCH Score/Interventions                      Lactation Tools Discussed/Used Tools: Pump;Nipple Shields Nipple shield size: 20 Breast pump type: Manual   Consult Status Consult Status: Follow-up Date: 01/04/17 Follow-up type: In-patient    Andres Labrum 01/04/2017, 11:34 AM

## 2017-01-04 NOTE — Progress Notes (Signed)
Subjective: Postpartum Day #2: Cesarean Delivery Patient reports incisional pain, tolerating PO and no problems voiding.   Breastfeeding going slowly; baby just put under bili lights. Denies dizziness with ambulation.  Objective: Vital signs in last 24 hours: Temp:  [98.3 F (36.8 C)-98.8 F (37.1 C)] 98.4 F (36.9 C) (07/16 0523) Pulse Rate:  [65-102] 79 (07/16 0523) Resp:  [18] 18 (07/16 0523) BP: (95-125)/(41-76) 116/41 (07/16 0523) SpO2:  [95 %-100 %] 100 % (07/16 0523)  Physical Exam:  General: alert, cooperative and mild distress Lochia: appropriate Uterine Fundus: firm Incision: PICO intact, dry DVT Evaluation: No evidence of DVT seen on physical exam.   Recent Labs  01/01/17 1706 01/03/17 0519  HGB 11.2* 7.1*  HCT 34.9* 21.5*    Assessment/Plan: Status post Cesarean section. Doing well postoperatively.  Continue current care. Dilaudid started due to Perc allergy; FeSO4 added bid. Anticipate d/c 01/05/17.  Serita Grammes CNM 01/04/2017, 7:08 AM

## 2017-01-04 NOTE — Progress Notes (Signed)
CSW met with MOB to offer support after receiving consult for concern for flat affect.  CSW met with MOB, FOB and PGM in MOB's first floor room/130 to offer support and complete assessment.  MOB was quiet, but welcoming and provided consent to speak openly with her husband and mother-in-law in the room.  When asked what baby's name is, MOB's face lit up as she replied, "Mckenzie Cooley."  She states she is feeling terrible physically, but states no concerns about her emotions now, nor does she endorse any hx of anxiety or depression.  CSW asked them how they are feeling about becoming parents.  FOB replied, "awesome!"  MOB smiled as she looked in the direction of her baby, saying, "We've waited 6 years and 3 miscarriages for her."  She added, "I look at her and sometimes can't believe she's really mine."  MOB acknowledges that she is not sure the fact that her baby is here has fully set in.  CSW was not able to see MOB interact with baby, as baby is on phototherapy.  Both parents were attentive to baby however, as evidenced by MOB asking FOB to put pacifier back in baby's mouth when it fell out and MOB noticing when baby spit.  CSW feels they are responding appropriately to the situation and did not witness flat affect.  Parents report that they have been prepared for baby for "months" and were "just waiting on her to get here."  They report having great family support.  CSW provided education regarding PMADs and encouraged MOB to evaluate herself with a New Mom Checklist and notify her MD if she has concerns about her mental health at any point.  MOB agreed.  CSW informed parents of support groups held at [facility].  Parents stated appreciation for the visit and state no questions, concerns or needs at this time.  CSW identifies no barriers to discharge when infant is medically ready.

## 2017-01-05 ENCOUNTER — Inpatient Hospital Stay (HOSPITAL_COMMUNITY): Admission: RE | Admit: 2017-01-05 | Payer: BLUE CROSS/BLUE SHIELD | Source: Ambulatory Visit

## 2017-01-05 DIAGNOSIS — D62 Acute posthemorrhagic anemia: Secondary | ICD-10-CM

## 2017-01-05 MED ORDER — HYDROMORPHONE HCL 2 MG PO TABS: 1.0000 mg | ORAL_TABLET | ORAL | 0 refills | Status: DC | PRN

## 2017-01-05 MED ORDER — NORETHINDRONE 0.35 MG PO TABS: 1.0000 | ORAL_TABLET | Freq: Every day | ORAL | 11 refills | Status: DC

## 2017-01-05 MED ORDER — IBUPROFEN 600 MG PO TABS: 600.0000 mg | ORAL_TABLET | Freq: Four times a day (QID) | ORAL | 0 refills | Status: DC

## 2017-01-05 MED ORDER — FERROUS SULFATE 325 (65 FE) MG PO TABS: 325.0000 mg | ORAL_TABLET | Freq: Two times a day (BID) | ORAL | 1 refills | Status: DC

## 2017-01-05 NOTE — Discharge Summary (Signed)
OB Discharge Summary     Patient Name: Mckenzie Cooley DOB: 10-Mar-1991 MRN: [id number]  Date of admission: 01/01/2017 Delivering MD: Chancy Milroy   Date of discharge: 01/05/2017  Admitting diagnosis: 40WKS, BLOODY MUCUS Intrauterine pregnancy: [redacted]w[redacted]d     Secondary diagnosis:  Active Problems:   Mild preeclampsia   Acute blood loss anemia  repeat cesarean section  Additional problems: none     Discharge diagnosis: Term Pregnancy Delivered                                                                                                Post partum procedures: none  Augmentation: Pitocin  Complications: acute blood loss anemia  Hospital course:  Induction of Labor With Cesarean Section  26 y.o. yo [phone] at [redacted]w[redacted]d was admitted to the hospital 01/01/2017 for induction of labor for mild preE. Patient had a labor course significant for failure to progress. The patient went for cesarean section due to Arrest of Dilation, and delivered a Viable infant )8:12 PM ,01/01/2017   @Details  of operation can be found in separate operative Note.  Patient had an uncomplicated postpartum course. She is ambulating, tolerating a regular diet, passing flatus, and urinating well.  Patient is discharged home in stable condition on 01/05/17.  H 7.1, asymptomatic, pt declined transfusion or feraheme, will d/c with po iron. Pico wound vac in place, instructions given. Mild bp elevation postpartum, no s/s severe disease, pt will present to prenatal provider in next 48-72 hours for bp check                                  Physical exam  Vitals:   01/03/17 1812 01/04/17 0523 01/04/17 1700 01/05/17 0555  BP: (!) 125/51 (!) 116/41 (!) 141/68 (!) 141/75  Pulse: 65 79 72 61  Resp: 18 18 18 18   Temp: 98.7 F (37.1 C) 98.4 F (36.9 C) 98.5 F (36.9 C) 97.9 F (36.6 C)  TempSrc: Oral Oral Oral Oral  SpO2: 99% 100%  100%  Weight:      Height:       General: alert, cooperative and no distress Lochia:  appropriate Uterine Fundus: firm Incision: dressing dry DVT Evaluation: No cords or calf tenderness. No significant calf/ankle edema. Labs: Lab Results  Component Value Date   WBC 26.7 (H) 01/03/2017   HGB 7.1 (L) 01/03/2017   HCT 21.5 (L) 01/03/2017   MCV 76.2 (L) 01/03/2017   PLT 187 01/03/2017   CMP Latest Ref Rng & Units 01/01/2017  Glucose 65 - 99 mg/dL 89  BUN 6 - 20 mg/dL 8  Creatinine 0.44 - 1.00 mg/dL 0.63  Sodium 135 - 145 mmol/L 136  Potassium 3.5 - 5.1 mmol/L 3.9  Chloride 101 - 111 mmol/L 108  CO2 22 - 32 mmol/L 20(L)  Calcium 8.9 - 10.3 mg/dL 8.7(L)  Total Protein 6.5 - 8.1 g/dL 6.3(L)  Total Bilirubin 0.3 - 1.2 mg/dL 0.2(L)  Alkaline Phos 38 - 126 U/L 127(H)  AST 15 - 41 U/L 24  ALT 14 - 54 U/L 17    Discharge instruction: per After Visit Summary and "Baby and Me Booklet".  After visit meds:  Allergies as of 01/05/2017      Reactions   Percocet [oxycodone-acetaminophen] Hives, Itching   Amoxicillin Rash   Has patient had a PCN reaction causing immediate rash, facial/tongue/throat swelling, SOB or lightheadedness with hypotension: Yes -mild rash Has patient had a PCN reaction causing severe rash involving mucus membranes or skin necrosis: No Has patient had a PCN reaction that required hospitalization: No Has patient had a PCN reaction occurring within the last 10 years: Yes If all of the above answers are "NO", then may proceed with Cephalosporin use. Has tolerated ceftriaxone & cephalexin       Medication List    STOP taking these medications   Doxylamine-Pyridoxine 10-10 MG Tbec   magnesium hydroxide 400 MG/5ML suspension Commonly known as:  MILK OF MAGNESIA   pantoprazole 20 MG tablet Commonly known as:  PROTONIX     TAKE these medications   acetaminophen 500 MG tablet Commonly known as:  TYLENOL Take 500 mg by mouth every 6 (six) hours as needed for mild pain or headache.   ferrous sulfate 325 (65 FE) MG tablet Commonly known as:   FERROUSUL Take 1 tablet (325 mg total) by mouth 2 (two) times daily.   HYDROmorphone 2 MG tablet Commonly known as:  DILAUDID Take 0.5 tablets (1 mg total) by mouth every 3 (three) hours as needed for severe pain.   ibuprofen 600 MG tablet Commonly known as:  ADVIL,MOTRIN Take 1 tablet (600 mg total) by mouth every 6 (six) hours.       Diet: routine diet  Activity: Advance as tolerated. Pelvic rest for 6 weeks.   Outpatient follow up: 2-3 days bp check, then 6 wks  Follow up Appt:Future Appointments Date Time Provider [place]  02/09/2017 10:30 AM Roma Schanz, CNM [facility]   Follow up Visit:No Follow-up on file.  Postpartum contraception: POP (prescribed)  Newborn Data: Live born female  Birth Weight: 8 lb 8 oz (3855 g) APGAR: 8, 9  Baby Feeding: Breast Disposition: pending   01/05/2017 Desma Maxim, MD

## 2017-01-05 NOTE — Discharge Instructions (Signed)
Iron Deficiency Anemia, Adult Iron-deficiency anemia is when you have a low amount of red blood cells or hemoglobin. This happens because you have too little iron in your body. Hemoglobin carries oxygen to parts of the body. Anemia can cause your body to not get enough oxygen. It may or may not cause symptoms. Follow these instructions at home: Medicines  Take over-the-counter and prescription medicines only as told by your doctor. This includes iron pills (supplements) and vitamins.  If you cannot handle taking iron pills by mouth, ask your doctor about getting iron through: ? A vein (intravenously). ? A shot (injection) into a muscle.  Take iron pills when your stomach is empty. If you cannot handle this, take them with food.  Do not drink milk or take antacids at the same time as your iron pills.  To prevent trouble pooping (constipation), eat fiber or take medicine (stool softener) as told by your doctor. Eating and drinking  Talk with your doctor before changing the foods you eat. He or she may tell you to eat foods that have a lot of iron, such as: ? Liver. ? Lowfat (lean) beef. ? Breads and cereals that have iron added to them (fortified breads and cereals). ? Eggs. ? Dried fruit. ? Dark green, leafy vegetables.  Drink enough fluid to keep your pee (urine) clear or pale yellow.  Eat fresh fruits and vegetables that are high in vitamin C. They help your body to use iron. Foods with a lot of vitamin C include: ? Oranges. ? Peppers. ? Tomatoes. ? Mangoes. General instructions  Return to your normal activities as told by your doctor. Ask your doctor what activities are safe for you.  Keep yourself clean, and keep things clean around you (your surroundings). Anemia can make you get sick more easily.  Keep all follow-up visits as told by your doctor. This is important. Contact a doctor if:  You feel sick to your stomach (nauseous).  You throw up (vomit).  You feel  weak.  You are sweating for no clear reason.  You have trouble pooping, such as: ? Pooping (having a bowel movement) less than 3 times a week. ? Straining to poop. ? Having poop that is hard, dry, or larger than normal. ? Feeling full or bloated. ? Pain in the lower belly. ? Not feeling better after pooping. Get help right away if:  You pass out (faint). If this happens, do not drive yourself to the hospital. Call your local emergency services (911 in the [place]  You have chest pain.  You have shortness of breath that: ? Is very bad. ? Gets worse with physical activity.  You have a fast heartbeat.  You get light-headed when getting up from sitting or lying down. This information is not intended to replace advice given to you by your health care provider. Make sure you discuss any questions you have with your health care provider. Document Released: 07/11/2010 Document Revised: 02/26/2016 Document Reviewed: 02/26/2016 Elsevier Interactive Patient Education  2017 [employer] Cesarean Delivery, Care After Refer to this sheet in the next few weeks. These instructions provide you with information about caring for yourself after your procedure. Your health care provider may also give you more specific instructions. Your treatment has been planned according to current medical practices, but problems sometimes occur. Call your health care provider if you have any problems or questions after your procedure. What can I expect after the procedure? After the procedure, it is common to  have:  A small amount of blood or clear fluid coming from the incision.  Some redness, swelling, and pain in your incision area.  Some abdominal pain and soreness.  Vaginal bleeding (lochia).  Pelvic cramps.  Fatigue.  Follow these instructions at home: Incision care   Follow instructions from your health care provider about how to take care of your incision. Make sure you: ? Wash your hands with  soap and water before you change your bandage (dressing). If soap and water are not available, use hand sanitizer. ? Change your dressing as told by your health care provider. ? Leave stitches (sutures), skin staples, skin glue, or adhesive strips in place. These skin closures may need to stay in place for 2 weeks or longer. If adhesive strip edges start to loosen and curl up, you may trim the loose edges. Do not remove adhesive strips completely unless your health care provider tells you to do that.  Check your incision area every day for signs of infection. Check for: ? More redness, swelling, or pain. ? More fluid or blood. ? Warmth. ? Pus or a bad smell.  When you cough or sneeze, hug a pillow. This helps with pain and decreases the chance of your incision opening up (dehiscing). Do this until your incision heals. Medicines  Take over-the-counter and prescription medicines only as told by your health care provider.  If you were prescribed an antibiotic medicine, take it as told by your health care provider. Do not stop taking the antibiotic until it is finished. Driving  Do not drive or operate heavy machinery while taking prescription pain medicine.  Do not drive for 24 hours if you received a sedative. Lifestyle  Do not drink alcohol. This is especially important if you are breastfeeding or taking pain medicine.  Do not use tobacco products, including cigarettes, chewing tobacco, or e-cigarettes. If you need help quitting, ask your health care provider. Tobacco can delay wound healing. Eating and drinking  Drink at least 8 eight-ounce glasses of water every day unless told not to by your health care provider. If you breastfeed, you may need to drink more water than this.  Eat high-fiber foods every day. These foods may help prevent or relieve constipation. High-fiber foods include: ? Whole grain cereals and breads. ? Brown rice. ? Beans. ? Fresh fruits and  vegetables. Activity  Return to your normal activities as told by your health care provider. Ask your health care provider what activities are safe for you.  Rest as much as possible. Try to rest or take a nap while your baby is sleeping.  Do not lift anything that is heavier than your baby or 10 lb (4.5 kg) as told by your health care provider.  Ask your health care provider when you can engage in sexual activity. This may depend on your: ? Risk of infection. ? Healing rate. ? Comfort and desire to engage in sexual activity. Bathing  Do not take baths, swim, or use a hot tub until your health care provider approves. Ask your health care provider if you can take showers. You may only be allowed to take sponge baths until your incision heals.  Keep your dressing dry as told by your health care provider. General instructions  Do not use tampons or douches until your health care provider approves.  Wear: ? Loose, comfortable clothing. ? A supportive and well-fitting bra.  Watch for any blood clots that may pass from your vagina. These may look  like clumps of dark red, brown, or black discharge.  Keep your perineum clean and dry as told by your health care provider.  Wipe from front to back when you use the toilet.  If possible, have someone help you care for your baby and help with household activities for a few days after you leave the hospital.  Keep all follow-up visits for you and your baby as told by your health care provider. This is important. Contact a health care provider if:  You have: ? Bad-smelling vaginal discharge. ? Difficulty urinating. ? Pain when urinating. ? A sudden increase or decrease in the frequency of your bowel movements. ? More redness, swelling, or pain around your incision. ? More fluid or blood coming from your incision. ? Pus or a bad smell coming from your incision. ? A fever. ? A rash. ? Little or no interest in activities you used to  enjoy. ? Questions about caring for yourself or your baby. ? Nausea.  Your incision feels warm to the touch.  Your breasts turn red or become painful or hard.  You feel unusually sad or worried.  You vomit.  You pass large blood clots from your vagina. If you pass a blood clot, save it to show to your health care provider. Do not flush blood clots down the toilet without showing your health care provider.  You urinate more than usual.  You are dizzy or light-headed.  You have not breastfed and have not had a menstrual period for 12 weeks after delivery.  You stopped breastfeeding and have not had a menstrual period for 12 weeks after stopping breastfeeding. Get help right away if:  You have: ? Pain that does not go away or get better with medicine. ? Chest pain. ? Difficulty breathing. ? Blurred vision or spots in your vision. ? Thoughts about hurting yourself or your baby. ? New pain in your abdomen or in one of your legs. ? A severe headache.  You faint.  You bleed from your vagina so much that you fill two sanitary pads in one hour. This information is not intended to replace advice given to you by your health care provider. Make sure you discuss any questions you have with your health care provider. Document Released: 02/28/2002 Document Revised: 10/17/2015 Document Reviewed: 05/13/2015 Elsevier Interactive Patient Education  2017 [employer].

## 2017-01-06 ENCOUNTER — Observation Stay (HOSPITAL_COMMUNITY)
Admission: AD | Admit: 2017-01-06 | Discharge: 2017-01-07 | Disposition: A | Discharge status: Home / Self Care | Payer: BLUE CROSS/BLUE SHIELD | Source: Ambulatory Visit | Attending: Family Medicine | Admitting: Family Medicine

## 2017-01-06 ENCOUNTER — Telehealth: Payer: Self-pay | Admitting: *Deleted

## 2017-01-06 ENCOUNTER — Encounter (HOSPITAL_COMMUNITY): Payer: Self-pay

## 2017-01-06 DIAGNOSIS — Z87891 Personal history of nicotine dependence: Secondary | ICD-10-CM | POA: Diagnosis not present

## 2017-01-06 DIAGNOSIS — O1410 Severe pre-eclampsia, unspecified trimester: Secondary | ICD-10-CM | POA: Diagnosis not present

## 2017-01-06 DIAGNOSIS — O9081 Anemia of the puerperium: Secondary | ICD-10-CM | POA: Diagnosis not present

## 2017-01-06 DIAGNOSIS — D62 Acute posthemorrhagic anemia: Secondary | ICD-10-CM | POA: Diagnosis not present

## 2017-01-06 DIAGNOSIS — Z880 Allergy status to penicillin: Secondary | ICD-10-CM | POA: Diagnosis not present

## 2017-01-06 DIAGNOSIS — O1415 Severe pre-eclampsia, complicating the puerperium: Secondary | ICD-10-CM | POA: Diagnosis not present

## 2017-01-06 DIAGNOSIS — K219 Gastro-esophageal reflux disease without esophagitis: Secondary | ICD-10-CM | POA: Diagnosis not present

## 2017-01-06 DIAGNOSIS — O99215 Obesity complicating the puerperium: Secondary | ICD-10-CM | POA: Diagnosis not present

## 2017-01-06 DIAGNOSIS — O9963 Diseases of the digestive system complicating the puerperium: Secondary | ICD-10-CM | POA: Diagnosis not present

## 2017-01-06 DIAGNOSIS — O1495 Unspecified pre-eclampsia, complicating the puerperium: Secondary | ICD-10-CM | POA: Diagnosis not present

## 2017-01-06 LAB — CBC
HEMATOCRIT: 25.8 % — AB (ref 36.0–46.0)
Hemoglobin: 8.6 g/dL — ABNORMAL LOW (ref 12.0–15.0)
MCH: 27.1 pg (ref 26.0–34.0)
MCHC: 33.3 g/dL (ref 30.0–36.0)
MCV: 81.4 fL (ref 78.0–100.0)
PLATELETS: 281 10*3/uL (ref 150–400)
RBC: 3.17 MIL/uL — ABNORMAL LOW (ref 3.87–5.11)
RDW: 16.3 % — AB (ref 11.5–15.5)
WBC: 11.3 10*3/uL — AB (ref 4.0–10.5)

## 2017-01-06 LAB — HEMOGLOBIN AND HEMATOCRIT, BLOOD
HEMATOCRIT: 18.2 % — AB (ref 36.0–46.0)
HEMOGLOBIN: 5.8 g/dL — AB (ref 12.0–15.0)

## 2017-01-06 LAB — PREPARE RBC (CROSSMATCH)

## 2017-01-06 MED ORDER — SODIUM CHLORIDE 0.9 % IV SOLN
Freq: Once | INTRAVENOUS | Status: AC
  Administered 2017-01-06: 16:00:00 via INTRAVENOUS

## 2017-01-06 MED ORDER — ACETAMINOPHEN 325 MG PO TABS
650.0000 mg | ORAL_TABLET | Freq: Once | ORAL | Status: AC
  Administered 2017-01-06: 650 mg via ORAL
  Filled 2017-01-06: qty 2

## 2017-01-06 MED ORDER — DIPHENHYDRAMINE HCL 25 MG PO CAPS
25.0000 mg | ORAL_CAPSULE | Freq: Once | ORAL | Status: AC
  Administered 2017-01-06: 25 mg via ORAL
  Filled 2017-01-06: qty 1

## 2017-01-06 MED ORDER — FUROSEMIDE 10 MG/ML IJ SOLN
20.0000 mg | Freq: Once | INTRAMUSCULAR | Status: AC
  Administered 2017-01-06: 20 mg via INTRAVENOUS
  Filled 2017-01-06: qty 2

## 2017-01-06 MED ORDER — LABETALOL HCL 200 MG PO TABS
200.0000 mg | ORAL_TABLET | Freq: Two times a day (BID) | ORAL | Status: DC
  Administered 2017-01-06: 200 mg via ORAL
  Filled 2017-01-06: qty 1

## 2017-01-06 NOTE — MAU Provider Note (Signed)
History     CSN: [id number]  Arrival date and time: 01/06/17 1344   First Provider Initiated Contact with Patient 01/06/17 1403      Chief Complaint  Patient presents with  . Shortness of Breath   HPI  [name] is a 26 yo [phone] who is 4 days PP presenting to MAU with complaints of SOB and swelling.  She was offered a blood transfusion while admitted, but declined d/t desire to "go home and tired."  She was d/c'd from the hospital yesterday with a Rx for iron supplements, but she has not taken them.  Once home her SOB, fatigue and sleepiness worsened.  She called the OB office today requesting a blood transfusion.  She was instructed to come to MAU for evaluation and transfusion.  Past Medical History:  Diagnosis Date  . Acid reflux Dx 2011  . Asthma    As a Child  . Bloating 2011  . Weight loss, abnormal     Past Surgical History:  Procedure Laterality Date  . CESAREAN SECTION N/A 01/02/2017   Procedure: CESAREAN SECTION;  Surgeon: Chancy Milroy, MD;  Location: [place] [facility];  Service: Obstetrics;  Laterality: N/A;  . CHOLECYSTECTOMY  2011   Dr.Byerly  . ESOPHAGOGASTRODUODENOSCOPY N/A 10/26/2014   Procedure: ESOPHAGOGASTRODUODENOSCOPY (EGD);  Surgeon: Rogene Houston, MD;  [facility];  Service: Endoscopy;  Laterality: N/A;  . UPPER GASTROINTESTINAL ENDOSCOPY  12/05/2009  . UPPER GASTROINTESTINAL ENDOSCOPY  02/07/2009  . WISDOM TOOTH EXTRACTION    . WISDOM TOOTH EXTRACTION      Family History  Problem Relation Age of Onset  . Hypertension Mother   . Heart disease Father   . Cancer - Cervical Maternal Aunt   . Kidney disease Maternal Grandmother   . Renal Disease Maternal Grandmother     Social History  Substance Use Topics  . Smoking status: Former Smoker    Packs/day: 0.50    Years: 12.00    Types: Cigarettes  . Smokeless tobacco: Never Used  . Alcohol use No    Allergies:  Allergies  Allergen Reactions  . Percocet  [Oxycodone-Acetaminophen] Hives and Itching  . Amoxicillin Rash    Has patient had a PCN reaction causing immediate rash, facial/tongue/throat swelling, SOB or lightheadedness with hypotension: Yes -mild rash Has patient had a PCN reaction causing severe rash involving mucus membranes or skin necrosis: No Has patient had a PCN reaction that required hospitalization: No Has patient had a PCN reaction occurring within the last 10 years: Yes If all of the above answers are "NO", then may proceed with Cephalosporin use. Has tolerated ceftriaxone & cephalexin     Prescriptions Prior to Admission  Medication Sig Dispense Refill Last Dose  . acetaminophen (TYLENOL) 500 MG tablet Take 500 mg by mouth every 6 (six) hours as needed for mild pain or headache.   Past Week at Unknown time  . ferrous sulfate (FERROUSUL) 325 (65 FE) MG tablet Take 1 tablet (325 mg total) by mouth 2 (two) times daily. 60 tablet 1   . HYDROmorphone (DILAUDID) 2 MG tablet Take 0.5 tablets (1 mg total) by mouth every 3 (three) hours as needed for severe pain. 20 tablet 0   . ibuprofen (ADVIL,MOTRIN) 600 MG tablet Take 1 tablet (600 mg total) by mouth every 6 (six) hours. 30 tablet 0   . norethindrone (MICRONOR,CAMILA,ERRIN) 0.35 MG tablet Take 1 tablet (0.35 mg total) by mouth daily. 1 Package 11  Review of Systems  Constitutional: Positive for fatigue.  HENT: Negative.   Eyes: Negative.   Respiratory: Positive for shortness of breath.   Cardiovascular: Positive for leg swelling.  Gastrointestinal: Positive for nausea.  Endocrine: Negative.   Genitourinary: Positive for vaginal bleeding (PP).  Musculoskeletal: Negative.   Skin: Positive for pallor.  Allergic/Immunologic: Negative.   Neurological: Positive for dizziness and light-headedness.  Hematological: Negative.   Psychiatric/Behavioral: Negative.    Physical Exam   Blood pressure (!) 142/74, pulse 71, temperature 98.3 F (36.8 C), temperature source Oral,  resp. rate 18, height 5' (1.524 m), weight 121.6 kg (268 lb), last menstrual period 01/25/2016, not currently breastfeeding.  Physical Exam  Constitutional: She is oriented to person, place, and time. She appears well-developed and well-nourished.  HENT:  Head: Normocephalic.  Eyes: Pupils are equal, round, and reactive to light.  Neck: Normal range of motion.  Cardiovascular: Normal rate, regular rhythm and normal heart sounds.   Respiratory: Breath sounds normal.  Increased effort while breathing, no pursed lip breathing, no cyanosis, no flushing  GI: Soft. Bowel sounds are normal.  Genitourinary:  Genitourinary Comments: Pelvic deferred  Musculoskeletal: Normal range of motion. She exhibits edema (2+ pitting edema BLE (from knee down to toes)).  Neurological: She is alert and oriented to person, place, and time.  Skin: Skin is warm. She is diaphoretic.  Psychiatric: She has a normal mood and affect. Her behavior is normal. Judgment and thought content normal.    MAU Course  Procedures  MDM Continuous Pulse Ox Tranfuse 2 units PRBCs over 2 hrs *Consult with Dr. Ernestina Patches @ 1427 - notified of patient's complaints, assessments, lab results, tx plan transfuse for 2 units of PRBCs and Lasix on HR OB unit - agrees with plan  Assessment and Plan  Admit for Observation Transfusion of 2 units of PRBCs Care assumed by Dr. Ernestina Patches upon admission for obs  Laury Deep, MSN, CNM 01/06/2017, 2:09 PM

## 2017-01-06 NOTE — Telephone Encounter (Signed)
Patient called stating she was discharged from the hospital yesterday and was offered a blood transfusion but patient states she refused because she was just ready to go home. States now she is SOB and very tired and wants to have it done now. Advised patient she would need [facility] in order to be evaluated for transfusion. Verbalized understanding.

## 2017-01-06 NOTE — MAU Note (Signed)
Pt discharged yesterday and dr suggested a blood transfusion but pt refused it because she was tired and ready to go home. Now pt is feeling SOB, tired, and sleepy. Dr prescribed oral iron pill but didn't take it because she wants the transfusion.

## 2017-01-06 NOTE — MAU Note (Signed)
CRITICAL VALUE STICKER  CRITICAL VALUE: Hgb 5.8  RECEIVER (on-site recipient of call): Erasmo Score [facility]  [place] NOTIFIED: 01/06/17 @1432   MESSENGER (representative from lab): Hoyle Sauer  MD NOTIFIED: Sunday Corn, CNM (in dept)  TIME OF NOTIFICATION: 0962  RESPONSE: Admit orders for blood transfusion being placed by provider.

## 2017-01-07 ENCOUNTER — Inpatient Hospital Stay (HOSPITAL_COMMUNITY)
Admission: AD | Admit: 2017-01-07 | Discharge: 2017-01-09 | DRG: 776 | Disposition: A | Discharge status: Home / Self Care | Payer: BLUE CROSS/BLUE SHIELD | Source: Ambulatory Visit | Attending: Obstetrics and Gynecology | Admitting: Obstetrics and Gynecology

## 2017-01-07 ENCOUNTER — Ambulatory Visit (INDEPENDENT_AMBULATORY_CARE_PROVIDER_SITE_OTHER): Payer: BLUE CROSS/BLUE SHIELD | Admitting: Women's Health

## 2017-01-07 ENCOUNTER — Encounter: Payer: Self-pay | Admitting: Women's Health

## 2017-01-07 ENCOUNTER — Encounter (HOSPITAL_COMMUNITY): Payer: Self-pay | Admitting: Orthopedic Surgery

## 2017-01-07 ENCOUNTER — Inpatient Hospital Stay (HOSPITAL_COMMUNITY): Admit: 2017-01-07 | Payer: Self-pay | Admitting: Obstetrics and Gynecology

## 2017-01-07 VITALS — BP 190/110 | HR 58 | Wt 256.8 lb

## 2017-01-07 DIAGNOSIS — O1410 Severe pre-eclampsia, unspecified trimester: Secondary | ICD-10-CM | POA: Diagnosis not present

## 2017-01-07 DIAGNOSIS — O99215 Obesity complicating the puerperium: Secondary | ICD-10-CM | POA: Diagnosis present

## 2017-01-07 DIAGNOSIS — O9963 Diseases of the digestive system complicating the puerperium: Secondary | ICD-10-CM | POA: Diagnosis present

## 2017-01-07 DIAGNOSIS — O1415 Severe pre-eclampsia, complicating the puerperium: Principal | ICD-10-CM | POA: Diagnosis present

## 2017-01-07 DIAGNOSIS — O9081 Anemia of the puerperium: Secondary | ICD-10-CM | POA: Diagnosis present

## 2017-01-07 DIAGNOSIS — K219 Gastro-esophageal reflux disease without esophagitis: Secondary | ICD-10-CM | POA: Diagnosis present

## 2017-01-07 DIAGNOSIS — D62 Acute posthemorrhagic anemia: Secondary | ICD-10-CM | POA: Diagnosis present

## 2017-01-07 DIAGNOSIS — O1495 Unspecified pre-eclampsia, complicating the puerperium: Secondary | ICD-10-CM

## 2017-01-07 DIAGNOSIS — Z880 Allergy status to penicillin: Secondary | ICD-10-CM | POA: Diagnosis not present

## 2017-01-07 DIAGNOSIS — Z87891 Personal history of nicotine dependence: Secondary | ICD-10-CM | POA: Diagnosis not present

## 2017-01-07 LAB — COMPREHENSIVE METABOLIC PANEL
ALT: 28 U/L (ref 14–54)
ANION GAP: 7 (ref 5–15)
AST: 45 U/L — ABNORMAL HIGH (ref 15–41)
Albumin: 2.7 g/dL — ABNORMAL LOW (ref 3.5–5.0)
Alkaline Phosphatase: 90 U/L (ref 38–126)
BUN: 13 mg/dL (ref 6–20)
CHLORIDE: 110 mmol/L (ref 101–111)
CO2: 23 mmol/L (ref 22–32)
Calcium: 8.5 mg/dL — ABNORMAL LOW (ref 8.9–10.3)
Creatinine, Ser: 0.82 mg/dL (ref 0.44–1.00)
GFR calc non Af Amer: >60 mL/min (ref >60)
Glucose, Bld: 94 mg/dL (ref 65–99)
POTASSIUM: 4.4 mmol/L (ref 3.5–5.1)
SODIUM: 140 mmol/L (ref 135–145)
Total Bilirubin: 1 mg/dL (ref 0.3–1.2)
Total Protein: 6.3 g/dL — ABNORMAL LOW (ref 6.5–8.1)

## 2017-01-07 LAB — BPAM RBC
BLOOD PRODUCT EXPIRATION DATE: 201807312359
Blood Product Expiration Date: 201807312359
ISSUE DATE / TIME: 201807181811
ISSUE DATE / TIME: 201807181546
UNIT TYPE AND RH: 6200
Unit Type and Rh: 6200

## 2017-01-07 LAB — PROTEIN / CREATININE RATIO, URINE

## 2017-01-07 LAB — TYPE AND SCREEN
ABO/RH(D): A POS
Antibody Screen: NEGATIVE
UNIT DIVISION: 0
Unit division: 0

## 2017-01-07 LAB — CBC
HCT: 25.6 % — ABNORMAL LOW (ref 36.0–46.0)
HEMOGLOBIN: 8.5 g/dL — AB (ref 12.0–15.0)
MCH: 27 pg (ref 26.0–34.0)
MCHC: 33.2 g/dL (ref 30.0–36.0)
MCV: 81.3 fL (ref 78.0–100.0)
Platelets: 286 10*3/uL (ref 150–400)
RBC: 3.15 MIL/uL — AB (ref 3.87–5.11)
RDW: 16.5 % — ABNORMAL HIGH (ref 11.5–15.5)
WBC: 10.7 10*3/uL — ABNORMAL HIGH (ref 4.0–10.5)

## 2017-01-07 MED ORDER — LABETALOL HCL 200 MG PO TABS
400.0000 mg | ORAL_TABLET | ORAL | Status: AC
  Administered 2017-01-07: 400 mg via ORAL
  Filled 2017-01-07: qty 2

## 2017-01-07 MED ORDER — LABETALOL HCL 5 MG/ML IV SOLN: 20.0000 mg | INTRAVENOUS | Status: DC | PRN

## 2017-01-07 MED ORDER — PRENATAL MULTIVITAMIN CH
1.0000 | ORAL_TABLET | Freq: Every day | ORAL | Status: DC
  Administered 2017-01-08: 1 via ORAL
  Filled 2017-01-07: qty 1

## 2017-01-07 MED ORDER — HYDRALAZINE HCL 20 MG/ML IJ SOLN: 5.0000 mg | INTRAMUSCULAR | Status: DC | PRN

## 2017-01-07 MED ORDER — SIMETHICONE 80 MG PO CHEW: 80.0000 mg | CHEWABLE_TABLET | ORAL | Status: DC

## 2017-01-07 MED ORDER — MAGNESIUM SULFATE BOLUS VIA INFUSION
4.0000 g | Freq: Once | INTRAVENOUS | Status: AC
  Administered 2017-01-07: 4 g via INTRAVENOUS
  Filled 2017-01-07: qty 500

## 2017-01-07 MED ORDER — OXYTOCIN 40 UNITS IN LACTATED RINGERS INFUSION - SIMPLE MED: 2.5000 [IU]/h | INTRAVENOUS | Status: DC

## 2017-01-07 MED ORDER — ZOLPIDEM TARTRATE 5 MG PO TABS: 5.0000 mg | ORAL_TABLET | Freq: Every evening | ORAL | Status: DC | PRN

## 2017-01-07 MED ORDER — WITCH HAZEL-GLYCERIN EX PADS: 1.0000 "application " | MEDICATED_PAD | CUTANEOUS | Status: DC | PRN

## 2017-01-07 MED ORDER — FERROUS SULFATE 325 (65 FE) MG PO TABS
325.0000 mg | ORAL_TABLET | Freq: Two times a day (BID) | ORAL | Status: DC
  Administered 2017-01-07 – 2017-01-08 (×3): 325 mg via ORAL
  Filled 2017-01-07 (×3): qty 1

## 2017-01-07 MED ORDER — ACETAMINOPHEN 325 MG PO TABS
650.0000 mg | ORAL_TABLET | ORAL | Status: DC | PRN
  Administered 2017-01-08: 650 mg via ORAL
  Filled 2017-01-07: qty 2

## 2017-01-07 MED ORDER — SENNOSIDES-DOCUSATE SODIUM 8.6-50 MG PO TABS: 2.0000 | ORAL_TABLET | Freq: Every evening | ORAL | Status: DC | PRN

## 2017-01-07 MED ORDER — SENNOSIDES-DOCUSATE SODIUM 8.6-50 MG PO TABS: 2.0000 | ORAL_TABLET | ORAL | Status: DC

## 2017-01-07 MED ORDER — SIMETHICONE 80 MG PO CHEW: 80.0000 mg | CHEWABLE_TABLET | Freq: Three times a day (TID) | ORAL | Status: DC

## 2017-01-07 MED ORDER — PANTOPRAZOLE SODIUM 20 MG PO TBEC
20.0000 mg | DELAYED_RELEASE_TABLET | Freq: Every day | ORAL | Status: DC
  Administered 2017-01-07 – 2017-01-08 (×2): 20 mg via ORAL
  Filled 2017-01-07 (×3): qty 1

## 2017-01-07 MED ORDER — COCONUT OIL OIL: 1.0000 "application " | TOPICAL_OIL | Status: DC | PRN

## 2017-01-07 MED ORDER — OXYCODONE-ACETAMINOPHEN 5-325 MG PO TABS: 1.0000 | ORAL_TABLET | ORAL | Status: DC | PRN

## 2017-01-07 MED ORDER — FUROSEMIDE 10 MG/ML IJ SOLN
10.0000 mg | Freq: Three times a day (TID) | INTRAMUSCULAR | Status: AC
  Administered 2017-01-07 – 2017-01-08 (×3): 10 mg via INTRAVENOUS
  Filled 2017-01-07 (×3): qty 1

## 2017-01-07 MED ORDER — LABETALOL HCL 200 MG PO TABS: 400.0000 mg | ORAL_TABLET | Freq: Two times a day (BID) | ORAL | 1 refills | Status: DC

## 2017-01-07 MED ORDER — LACTATED RINGERS IV SOLN
INTRAVENOUS | Status: DC
  Administered 2017-01-07: 21:00:00 via INTRAVENOUS

## 2017-01-07 MED ORDER — MAGNESIUM SULFATE 40 G IN LACTATED RINGERS - SIMPLE
2.0000 g/h | INTRAVENOUS | Status: AC
  Administered 2017-01-07 – 2017-01-08 (×2): 2 g/h via INTRAVENOUS
  Filled 2017-01-07: qty 40
  Filled 2017-01-07: qty 500

## 2017-01-07 MED ORDER — LACTATED RINGERS IV SOLN: INTRAVENOUS | Status: DC

## 2017-01-07 MED ORDER — DIBUCAINE 1 % RE OINT: 1.0000 "application " | TOPICAL_OINTMENT | RECTAL | Status: DC | PRN

## 2017-01-07 MED ORDER — SIMETHICONE 80 MG PO CHEW: 80.0000 mg | CHEWABLE_TABLET | ORAL | Status: DC | PRN

## 2017-01-07 MED ORDER — OXYCODONE-ACETAMINOPHEN 5-325 MG PO TABS: 2.0000 | ORAL_TABLET | ORAL | Status: DC | PRN

## 2017-01-07 MED ORDER — DIPHENHYDRAMINE HCL 25 MG PO CAPS: 25.0000 mg | ORAL_CAPSULE | Freq: Four times a day (QID) | ORAL | Status: DC | PRN

## 2017-01-07 NOTE — H&P (Signed)
Subjective:   Patient is a 26 y.o. female [id number] POD # 5 from [place] presents with severe preeclampsia by blood pressure. Pt had LTCS 5 days ago. Postpartum course unremarkable except Hgb @ 7. Declined blood transfusion. Presented to MAU yesterday with SOB and fatigue and Hgb 5.6. Was transfused with 2 units PRBC's. Left AMA this morning @ 2 AM. Was started on BP meds prior to [facility]. Seen in office this afternoon for BP check, noted to be 180-190's /110-120's. Denies HA, visual changes or RUQ pain.  Has been ambulating, voiding, tolerating diet and good pain control otherwise. Breast/Bottle feeding. Bleeding min.  Patient Active Problem List   Diagnosis Date Noted  . Severe preeclampsia 01/07/2017  . Postoperative anemia due to acute blood loss 01/06/2017  . Acute blood loss anemia 01/05/2017  . Mild preeclampsia 01/01/2017  . Encounter for supervision of other normal pregnancy, third trimester 05/27/2016  . Constipation 10/27/2014  . Epigastric pain 10/26/2014  . Abdominal pain 10/26/2014  . PCOS (polycystic ovarian syndrome) 09/27/2014  . Abdominal pain, epigastric 09/27/2014  . Smoker 09/27/2014  . Intermittent constipation 09/27/2014  . GERD (gastroesophageal reflux disease) 07/01/2011  . Asthma 07/01/2011  . Gastroesophageal reflux disease 07/01/2011   Past Medical History:  Diagnosis Date  . Acid reflux Dx 2011  . Asthma    As a Child  . Bloating 2011  . Weight loss, abnormal     Past Surgical History:  Procedure Laterality Date  . CESAREAN SECTION N/A 01/02/2017   Procedure: CESAREAN SECTION;  Surgeon: Chancy Milroy, MD;  Location: [place];  Service: Obstetrics;  Laterality: N/A;  . CHOLECYSTECTOMY  2011   Dr.Byerly  . ESOPHAGOGASTRODUODENOSCOPY N/A 10/26/2014   Procedure: ESOPHAGOGASTRODUODENOSCOPY (EGD);  Surgeon: Rogene Houston, MD;  [facility];  Service: Endoscopy;  Laterality: N/A;  . UPPER GASTROINTESTINAL ENDOSCOPY  12/05/2009  . UPPER  GASTROINTESTINAL ENDOSCOPY  02/07/2009  . WISDOM TOOTH EXTRACTION    . WISDOM TOOTH EXTRACTION      Prescriptions Prior to Admission  Medication Sig Dispense Refill Last Dose  . ferrous sulfate (FERROUSUL) 325 (65 FE) MG tablet Take 1 tablet (325 mg total) by mouth 2 (two) times daily. 60 tablet 1 01/07/2017 at Unknown time  . ibuprofen (ADVIL,MOTRIN) 600 MG tablet Take 1 tablet (600 mg total) by mouth every 6 (six) hours. 30 tablet 0 01/07/2017 at Unknown time  . labetalol (NORMODYNE) 200 MG tablet Take 2 tablets (400 mg total) by mouth 2 (two) times daily. 60 tablet 1 01/07/2017 at 1300  . pantoprazole (PROTONIX) 20 MG tablet Take 20 mg by mouth daily.  9 Not Taking   Allergies  Allergen Reactions  . Percocet [Oxycodone-Acetaminophen] Hives and Itching  . Amoxicillin Rash    Has patient had a PCN reaction causing immediate rash, facial/tongue/throat swelling, SOB or lightheadedness with hypotension: Yes -mild rash Has patient had a PCN reaction causing severe rash involving mucus membranes or skin necrosis: No Has patient had a PCN reaction that required hospitalization: No Has patient had a PCN reaction occurring within the last 10 years: Yes If all of the above answers are "NO", then may proceed with Cephalosporin use. Has tolerated ceftriaxone & cephalexin     Social History  Substance Use Topics  . Smoking status: Former Smoker    Packs/day: 0.50    Years: 12.00    Types: Cigarettes  . Smokeless tobacco: Never Used  . Alcohol use No    Family History  Problem  Relation Age of Onset  . Hypertension Mother   . Heart disease Father   . Cancer - Cervical Maternal Aunt   . Kidney disease Maternal Grandmother   . Renal Disease Maternal Grandmother     Review of Systems Pertinent items are noted in HPI.  Objective:   Patient Vitals for the past 8 hrs:  BP Temp Temp src Pulse Resp SpO2 Height Weight  01/07/17 1848 - - - - - - 5' (1.524 m) 116.1 kg (256 lb)  01/07/17 1841 (!)  165/84 98.2 F (36.8 C) Oral (!) 52 20 99 % - -   No intake/output data recorded. No intake/output data recorded.  Lungs clear  Heart RRR Abd soft + BS obese, PICO inplace GU deferred Ext 2-3 + edema, no clonus, DTR's + 3      Assessment:  POD # 5 LTCS Severe Preeclampsia  Plan:   Pt will be admitted for 24 hrs of magnesium therapy, hypertension protocol, labs and oral antihypertensive medications as indicated. POC reviewed with pt. Pt verbalized understanding and agreed with POC.

## 2017-01-07 NOTE — Progress Notes (Signed)
Called and notified MD of pt request to leave AMA. New orders received with follow up instructions. Instructed pt to call office in AM for appt per Dr Glo Herring. Also given additional dose of Labetalol. Educated on emergent signs and symptoms and when to return to ED, pt voiced understanding. IV removed, pt discharged home with significant other

## 2017-01-07 NOTE — Progress Notes (Signed)
   [facility] Visit  Patient name: Mckenzie Cooley MRN [id number]  Date of birth: 06-18-1991  CC & HPI:  Mckenzie Cooley is a 26 y.o. [phone] Caucasian female 5d s/p PLTCS for FTP after IOL for pre-e w/o severe fx-did not receive mag-was not d/c'd on bp meds, presenting today for bp check. She went to MAU yesterday w/ sob and Hgb 5.8, was 7.1 on initial d/c however she declined blood transfusion at that time. Did get 2u prbc yesterday evening, and repeat hgb 8.6. BP on arrival to [facility]au was 142/74. States Dr. Glo Herring d/c'd her this am around 0200 w/ rx for labetalol 400mg  BID, which she took at 1300 (~2hrs prior to this appt). States she's had headaches and seeing spots 'for awhile during pregnancy', no recent changes of either. Denies ruq/epigastric pain, n/v. Trying to breastfeed. SOB has resolved since blood transfusion, however she just feels 'tingly all over'.  No LMP recorded.  Pertinent History Reviewed:  Medical & Surgical Hx:   Past medical, surgical, family, and social history reviewed in electronic medical record Medications: Reviewed & Updated - see associated section Allergies: Reviewed in electronic medical record  Objective Findings:  Vitals: BP (!) 172/92 (BP Location: Left Arm, Patient Position: Sitting, Cuff Size: Large)   Pulse (!) 58   Wt 256 lb 12.8 oz (116.5 kg)   Breastfeeding? Yes   BMI 50.15 kg/m  Body mass index is 50.15 kg/m. Manual bp recheck by me: 180/120, 190/110  Physical Examination: General appearance - alert, well appearing, and in no distress C/S incision: pico dressing still intact, no evidence of infection DTRs 2-3+, no clonus 2+ BLE up to knees  Results for orders placed or performed during the hospital encounter of 01/06/17 (from the past 24 hour(s))  CBC   Collection Time: 01/06/17 10:48 PM  Result Value Ref Range   WBC 11.3 (H) 4.0 - 10.5 K/uL   RBC 3.17 (L) 3.87 - 5.11 MIL/uL   Hemoglobin 8.6 (L) 12.0 - 15.0 g/dL   HCT 25.8 (L) 36.0  - 46.0 %   MCV 81.4 78.0 - 100.0 fL   MCH 27.1 26.0 - 34.0 pg   MCHC 33.3 30.0 - 36.0 g/dL   RDW 16.3 (H) 11.5 - 15.5 %   Platelets 281 150 - 400 K/uL  Protein / creatinine ratio, urine   Collection Time: 01/07/17 12:01 AM  Result Value Ref Range   Creatinine, Urine <10 mg/dL   Total Protein, Urine <6 mg/dL   Protein Creatinine Ratio        0.00 - 0.15 mg/mg[Cre]     Assessment & Plan:  A:   5d s/p PLTCS after IOL for pre-e w/o severe features  S/p 2u prbc for symptomatic anemia  Severe pre-e in postpartum period  Breastfeeding  P:  Discussed w/ pt she needs to be readmitted- pt very tearful and states she doesn't want to go. Dr. Elonda Husky also came in and spoke w/ pt>needs to be readmitted for 24hrs magnesium and bp control, pt ok as long as she can bring her baby- husband will be there to help take care of baby  To go straight to [facility] and high-risk unit RN notified  Return in about 1 week (around 01/14/2017) for F/U w/ [facility].  Tawnya Crook CNM, [facility] 01/07/2017 4:05 PM

## 2017-01-08 ENCOUNTER — Encounter (HOSPITAL_COMMUNITY): Payer: Self-pay | Admitting: Orthopedic Surgery

## 2017-01-08 NOTE — Progress Notes (Signed)
Patient ID: Mckenzie Cooley, female   DOB: 13-May-1991, 26 y.o.   MRN: [id number] HD # 1  Pt has not slept much through the night. Denies HA or visual changes,  PE AF BP 150's/70's  Good UOP Lungs clear  Heart RRR Abd soft + BS PICO in place Ext 2 + edema  A/P POD # 6 LTCS        Severe PEC        Anemia        Obesity  Stable. Continue with magnesium x 24 hrs. Monitor BP. Good diuresis with magnesium and lasix

## 2017-01-09 ENCOUNTER — Encounter (HOSPITAL_COMMUNITY): Payer: Self-pay | Admitting: Obstetrics & Gynecology

## 2017-01-09 DIAGNOSIS — O1415 Severe pre-eclampsia, complicating the puerperium: Principal | ICD-10-CM

## 2017-01-09 MED ORDER — POTASSIUM CHLORIDE CRYS ER 20 MEQ PO TBCR: 40.0000 meq | EXTENDED_RELEASE_TABLET | Freq: Every day | ORAL | 1 refills | Status: DC

## 2017-01-09 MED ORDER — AMLODIPINE BESYLATE 5 MG PO TABS
5.0000 mg | ORAL_TABLET | Freq: Every day | ORAL | Status: DC
  Administered 2017-01-09: 5 mg via ORAL
  Filled 2017-01-09: qty 1

## 2017-01-09 MED ORDER — AMLODIPINE BESYLATE 5 MG PO TABS: 5.0000 mg | ORAL_TABLET | Freq: Every day | ORAL | 3 refills | Status: DC

## 2017-01-09 MED ORDER — FUROSEMIDE 20 MG PO TABS: 20.0000 mg | ORAL_TABLET | Freq: Every day | ORAL | 0 refills | Status: DC

## 2017-01-09 NOTE — Discharge Summary (Signed)
Physician Discharge Summary  Patient ID: Mckenzie Cooley MRN: [id number] DOB/AGE: 1990-09-13 26 y.o.  Admit date: 01/07/2017 Discharge date: 01/09/2017  Admission Diagnoses:  Discharge Diagnoses:  Principal Problem:   Hypertension in pregnancy, preeclampsia, severe, postpartum condition  Discharged Condition: stable  Hospital Course: Patient is a 26 y.o. female [id number] s/p LTCS on 01/02/17 who presents with severe preeclampsia by blood pressure criteria. Postpartum course unremarkable except Hgb @ 7. Declined blood transfusion. Presented to [facility] 01/06/17 with SOB and fatigue and Hgb 5.6. Was transfused with 2 units PRBC's. Left AMA this 01/07/17 morning @ 2 AM. Was started on BP meds prior to [facility]. Seen in office 01/07/17 afternoon for BP check, noted to be 180-190's /110-120's. Denies HA, visual changes or RUQ pain.    She was admitted, started on magnesium sulfate x 24 hours. Given lasix to help with edema and SOB. Norvasc was started for BP control. By time of discharge, BP was controlled and she was sent home with plans for outpatient follow up.  PICO negative pressure dressing was removed prior to discharge.  Consults: None  Significant Diagnostic Studies: Results for orders placed or performed during the hospital encounter of 01/07/17 (from the past 72 hour(s))  Comprehensive metabolic panel     Status: Abnormal   Collection Time: 01/07/17  8:21 PM  Result Value Ref Range   Sodium 140 135 - 145 mmol/L   Potassium 4.4 3.5 - 5.1 mmol/L   Chloride 110 101 - 111 mmol/L   CO2 23 22 - 32 mmol/L   Glucose, Bld 94 65 - 99 mg/dL   BUN 13 6 - 20 mg/dL   Creatinine, Ser 0.82 0.44 - 1.00 mg/dL   Calcium 8.5 (L) 8.9 - 10.3 mg/dL   Total Protein 6.3 (L) 6.5 - 8.1 g/dL   Albumin 2.7 (L) 3.5 - 5.0 g/dL   AST 45 (H) 15 - 41 U/L   ALT 28 14 - 54 U/L   Alkaline Phosphatase 90 38 - 126 U/L   Total Bilirubin 1.0 0.3 - 1.2 mg/dL   GFR calc non Af Amer >60 >60 mL/min   GFR calc Af Amer >60 >60 mL/min    Comment: (NOTE) The eGFR has been calculated using the CKD EPI equation. This calculation has not been validated in all clinical situations. eGFR's persistently <60 mL/min signify possible Chronic Kidney Disease.    Anion gap 7 5 - 15  CBC     Status: Abnormal   Collection Time: 01/07/17  8:21 PM  Result Value Ref Range   WBC 10.7 (H) 4.0 - 10.5 K/uL   RBC 3.15 (L) 3.87 - 5.11 MIL/uL   Hemoglobin 8.5 (L) 12.0 - 15.0 g/dL   HCT 25.6 (L) 36.0 - 46.0 %   MCV 81.3 78.0 - 100.0 fL   MCH 27.0 26.0 - 34.0 pg   MCHC 33.2 30.0 - 36.0 g/dL   RDW 16.5 (H) 11.5 - 15.5 %   Platelets 286 150 - 400 K/uL    Discharge Exam: Blood pressure 129/70, pulse 63, temperature 98.8 F (37.1 C), temperature source Oral, resp. rate 18, height 5' (1.524 m), weight 256 lb (116.1 kg), SpO2 98 %, currently breastfeeding. General appearance: alert and no distress Resp: clear to auscultation bilaterally Cardio: regular rate and rhythm GI: soft, non-tender; bowel sounds normal; no masses,  no organomegaly Pelvic: deferred Extremities: extremities normal, atraumatic, no cyanosis or edema, edema 2-3+ and no edema, redness or tenderness in the calves or thighs Pulses: 2+  and symmetric Skin: Skin color, texture, turgor normal. No rashes or lesions Neurologic: Alert and oriented X 3, normal strength and tone. Normal symmetric reflexes. Normal coordination and gait Incision/Wound: PICO dressing removed, incision C/D/I, no erythema, no drainage  Disposition: 01-Home or Self Care   Allergies as of 01/09/2017      Reactions   Percocet [oxycodone-acetaminophen] Hives, Itching   Amoxicillin Rash   Has patient had a PCN reaction causing immediate rash, facial/tongue/throat swelling, SOB or lightheadedness with hypotension: Yes -mild rash Has patient had a PCN reaction causing severe rash involving mucus membranes or skin necrosis: No Has patient had a PCN reaction that required hospitalization: No Has patient had a  PCN reaction occurring within the last 10 years: Yes If all of the above answers are "NO", then may proceed with Cephalosporin use. Has tolerated ceftriaxone & cephalexin       Medication List    TAKE these medications   amLODipine 5 MG tablet Commonly known as:  NORVASC Take 1 tablet (5 mg total) by mouth daily.   ferrous sulfate 325 (65 FE) MG tablet Commonly known as:  FERROUSUL Take 1 tablet (325 mg total) by mouth 2 (two) times daily.   furosemide 20 MG tablet Commonly known as:  LASIX Take 1 tablet (20 mg total) by mouth daily.   ibuprofen 600 MG tablet Commonly known as:  ADVIL,MOTRIN Take 1 tablet (600 mg total) by mouth every 6 (six) hours.   labetalol 200 MG tablet Commonly known as:  NORMODYNE Take 2 tablets (400 mg total) by mouth 2 (two) times daily.   potassium chloride SA 20 MEQ tablet Commonly known as:  K-DUR,KLOR-CON Take 2 tablets (40 mEq total) by mouth daily.      Follow-up Information    [facility] Follow up on 01/11/2017.   Why:  BP check Contact information: [address] [place] [address] [phone]          Signed: Verita Schneiders, MD 01/09/2017, 7:36 AM

## 2017-01-09 NOTE — Progress Notes (Signed)
Patient given discharge instructions, questions answered, patient states understanding. Patient signs and given copy.

## 2017-01-09 NOTE — Discharge Instructions (Signed)
Preeclampsia and Eclampsia °Preeclampsia is a serious condition that develops only during pregnancy. It is also called toxemia of pregnancy. This condition causes high blood pressure along with other symptoms, such as swelling and headaches. These symptoms may develop as the condition gets worse. Preeclampsia may occur at 20 weeks of pregnancy or later. °Diagnosing and treating preeclampsia early is very important. If not treated early, it can cause serious problems for you and your baby. One problem it can lead to is eclampsia, which is a condition that causes muscle jerking or shaking (convulsions or seizures) in the mother. Delivering your baby is the best treatment for preeclampsia or eclampsia. Preeclampsia and eclampsia symptoms usually go away after your baby is born. °What are the causes? °The cause of preeclampsia is not known. °What increases the risk? °The following risk factors make you more likely to develop preeclampsia: °· Being pregnant for the first time. °· Having had preeclampsia during a past pregnancy. °· Having a family history of preeclampsia. °· Having high blood pressure. °· Being pregnant with twins or triplets. °· Being 35 or older. °· Being African-American. °· Having kidney disease or diabetes. °· Having medical conditions such as lupus or blood diseases. °· Being very overweight (obese). ° °What are the signs or symptoms? °The earliest signs of preeclampsia are: °· High blood pressure. °· Increased protein in your urine. Your health care provider will check for this at every visit before you give birth (prenatal visit). ° °Other symptoms that may develop as the condition gets worse include: °· Severe headaches. °· Sudden weight gain. °· Swelling of the hands, face, legs, and feet. °· Nausea and vomiting. °· Vision problems, such as blurred or double vision. °· Numbness in the face, arms, legs, and feet. °· Urinating less than usual. °· Dizziness. °· Slurred speech. °· Abdominal pain,  especially upper abdominal pain. °· Convulsions or seizures. ° °Symptoms generally go away after giving birth. °How is this diagnosed? °There are no screening tests for preeclampsia. Your health care provider will ask you about symptoms and check for signs of preeclampsia during your prenatal visits. You may also have tests that include: °· Urine tests. °· Blood tests. °· Checking your blood pressure. °· Monitoring your baby’s heart rate. °· Ultrasound. ° °How is this treated? °You and your health care provider will determine the treatment approach that is best for you. Treatment may include: °· Having more frequent prenatal exams to check for signs of preeclampsia, if you have an increased risk for preeclampsia. °· Bed rest. °· Reducing how much salt (sodium) you eat. °· Medicine to lower your blood pressure. °· Staying in the hospital, if your condition is severe. There, treatment will focus on controlling your blood pressure and the amount of fluids in your body (fluid retention). °· You may need to take medicine (magnesium sulfate) to prevent seizures. This medicine may be given as an injection or through an IV tube. °· Delivering your baby early, if your condition gets worse. You may have your labor started with medicine (induced), or you may have a cesarean delivery. ° °Follow these instructions at home: °Eating and drinking ° °· Drink enough fluid to keep your urine clear or pale yellow. °· Eat a healthy diet that is low in sodium. Do not add salt to your food. Check nutrition labels to see how much sodium a food or beverage contains. °· Avoid caffeine. °Lifestyle °· Do not use any products that contain nicotine or tobacco, such as cigarettes   and e-cigarettes. If you need help quitting, ask your health care provider. °· Do not use alcohol or drugs. °· Avoid stress as much as possible. Rest and get plenty of sleep. °General instructions °· Take over-the-counter and prescription medicines only as told by your  health care provider. °· When lying down, lie on your side. This keeps pressure off of your baby. °· When sitting or lying down, raise (elevate) your feet. Try putting some pillows underneath your lower legs. °· Exercise regularly. Ask your health care provider what kinds of exercise are best for you. °· Keep all follow-up and prenatal visits as told by your health care provider. This is important. °How is this prevented? °To prevent preeclampsia or eclampsia from developing during another pregnancy: °· Get proper medical care during pregnancy. Your health care provider may be able to prevent preeclampsia or diagnose and treat it early. °· Your health care provider may have you take a low-dose aspirin or a calcium supplement during your next pregnancy. °· You may have tests of your blood pressure and kidney function after giving birth. °· Maintain a healthy weight. Ask your health care provider for help managing weight gain during pregnancy. °· Work with your health care provider to manage any long-term (chronic) health conditions you have, such as diabetes or kidney problems. ° °Contact a health care provider if: °· You gain more weight than expected. °· You have headaches. °· You have nausea or vomiting. °· You have abdominal pain. °· You feel dizzy or light-headed. °Get help right away if: °· You develop sudden or severe swelling anywhere in your body. This usually happens in the legs. °· You gain 5 lbs (2.3 kg) or more during one week. °· You have severe: °? Abdominal pain. °? Headaches. °? Dizziness. °? Vision problems. °? Confusion. °? Nausea or vomiting. °· You have a seizure. °· You have trouble moving any part of your body. °· You develop numbness in any part of your body. °· You have trouble speaking. °· You have any abnormal bleeding. °· You pass out. °This information is not intended to replace advice given to you by your health care provider. Make sure you discuss any questions you have with your health  care provider. °Document Released: 06/05/2000 Document Revised: 02/04/2016 Document Reviewed: 01/13/2016 °Elsevier Interactive Patient Education © 2018 [employer] ° °

## 2017-01-11 ENCOUNTER — Encounter: Payer: Self-pay | Admitting: Obstetrics & Gynecology

## 2017-01-11 ENCOUNTER — Ambulatory Visit (INDEPENDENT_AMBULATORY_CARE_PROVIDER_SITE_OTHER): Payer: BLUE CROSS/BLUE SHIELD | Admitting: Obstetrics & Gynecology

## 2017-01-11 VITALS — BP 140/90 | HR 60 | Wt 229.0 lb

## 2017-01-11 DIAGNOSIS — O1415 Severe pre-eclampsia, complicating the puerperium: Secondary | ICD-10-CM

## 2017-01-11 MED ORDER — TRIAMTERENE-HCTZ 37.5-25 MG PO CAPS: 1.0000 | ORAL_CAPSULE | Freq: Every day | ORAL | 1 refills | Status: DC

## 2017-01-11 NOTE — Progress Notes (Signed)
Chief Complaint  Patient presents with  . Hospitalization Follow-up    bp check- room #10    Blood pressure 140/90, pulse 60, weight 229 lb (103.9 kg), currently breastfeeding.  26 y.o. [id number] No LMP recorded. The current method of family planning is none.  Outpatient Encounter Prescriptions as of 01/11/2017  Medication Sig  . amLODipine (NORVASC) 5 MG tablet Take 1 tablet (5 mg total) by mouth daily.  . ferrous sulfate (FERROUSUL) 325 (65 FE) MG tablet Take 1 tablet (325 mg total) by mouth 2 (two) times daily.  . furosemide (LASIX) 20 MG tablet Take 1 tablet (20 mg total) by mouth daily.  . potassium chloride SA (K-DUR,KLOR-CON) 20 MEQ tablet Take 2 tablets (40 mEq total) by mouth daily.  Marland Kitchen ibuprofen (ADVIL,MOTRIN) 600 MG tablet Take 1 tablet (600 mg total) by mouth every 6 (six) hours. (Patient not taking: Reported on 01/11/2017)  . labetalol (NORMODYNE) 200 MG tablet Take 2 tablets (400 mg total) by mouth 2 (two) times daily. (Patient not taking: Reported on 01/11/2017)  . triamterene-hydrochlorothiazide (DYAZIDE) 37.5-25 MG capsule Take 1 each (1 capsule total) by mouth daily.   No facility-administered encounter medications on file as of 01/11/2017.     Subjective  Mckenzie Cooley is in for blood pressure check She experience experienced a postpartum preeclampsia and is currently on Norvasc 5 and Lasix 20 I'm going to decrease her diuretic to Dyazide 37.5/25 and stop the Lasix I'll see her back in 2 weeks and see what her blood pressures doing at that time hopefully be able to peel off the Norvasc  Objective Swelling certainly still present but improved Incisions clean dry and intact healing well  Pertinent ROS No headaches or blurred vision  Labs or studies Reviewed from the hospital    Impression Diagnoses this Encounter::   ICD-10-CM   1. Severe pre-eclampsia, postpartum O14.15     Established relevant diagnosis(es):   Plan/Recommendations: Meds ordered this  encounter  Medications  . triamterene-hydrochlorothiazide (DYAZIDE) 37.5-25 MG capsule    Sig: Take 1 each (1 capsule total) by mouth daily.    Dispense:  30 capsule    Refill:  1    Labs or Scans Ordered: No orders of the defined types were placed in this encounter.   Management:: As above stop Lasix begin Dyazide continue Norvasc Follow-up in 2 weeks  Follow up Return in about 2 weeks (around 01/25/2017) for Follow up, with Dr Elonda Husky.        Face to face time:  10 minutes  Greater than 50% of the visit time was spent in counseling and coordination of care with the patient.  The summary and outline of the counseling and care coordination is summarized in the note above.   All questions were answered.  Past Medical History:  Diagnosis Date  . Acid reflux Dx 2011  . Asthma    As a Child  . Bloating 2011  . PCOS (polycystic ovarian syndrome) 09/27/2014  . Weight loss, abnormal     Past Surgical History:  Procedure Laterality Date  . CESAREAN SECTION N/A 01/02/2017   Procedure: CESAREAN SECTION;  Surgeon: Chancy Milroy, MD;  Location: [facility];  Service: Obstetrics;  Laterality: N/A;  . CHOLECYSTECTOMY  2011   Dr.Byerly  . ESOPHAGOGASTRODUODENOSCOPY N/A 10/26/2014   Procedure: ESOPHAGOGASTRODUODENOSCOPY (EGD);  Surgeon: Rogene Houston, MD;  Location[facility];  Service: Endoscopy;  Laterality: N/A;  . UPPER GASTROINTESTINAL ENDOSCOPY  12/05/2009  .  UPPER GASTROINTESTINAL ENDOSCOPY  02/07/2009  . WISDOM TOOTH EXTRACTION    . WISDOM TOOTH EXTRACTION      OB History    Gravida Para Term Preterm AB Living   4 1 1   3 1    SAB TAB Ectopic Multiple Live Births   3 0 0 0 1      Allergies  Allergen Reactions  . Percocet [Oxycodone-Acetaminophen] Hives and Itching  . Amoxicillin Rash    Has patient had a PCN reaction causing immediate rash, facial/tongue/throat swelling, SOB or lightheadedness with hypotension: Yes -mild rash Has patient had a PCN  reaction causing severe rash involving mucus membranes or skin necrosis: No Has patient had a PCN reaction that required hospitalization: No Has patient had a PCN reaction occurring within the last 10 years: Yes If all of the above answers are "NO", then may proceed with Cephalosporin use. Has tolerated ceftriaxone & cephalexin     Social History   Social History  . Marital status: Married    Spouse name: N/A  . Number of children: N/A  . Years of education: N/A   Social History Main Topics  . Smoking status: Former Smoker    Packs/day: 0.50    Years: 12.00    Types: Cigarettes  . Smokeless tobacco: Never Used  . Alcohol use No  . Drug use: No  . Sexual activity: Not Currently    Birth control/ protection: None   Other Topics Concern  . None   Social History Narrative  . None    Family History  Problem Relation Age of Onset  . Hypertension Mother   . Heart disease Father   . Cancer - Cervical Maternal Aunt   . Kidney disease Maternal Grandmother   . Renal Disease Maternal Grandmother

## 2017-01-21 ENCOUNTER — Ambulatory Visit (INDEPENDENT_AMBULATORY_CARE_PROVIDER_SITE_OTHER): Payer: BLUE CROSS/BLUE SHIELD | Admitting: Obstetrics & Gynecology

## 2017-01-21 ENCOUNTER — Encounter: Payer: Self-pay | Admitting: Obstetrics & Gynecology

## 2017-01-21 VITALS — BP 122/78 | HR 60 | Wt 219.4 lb

## 2017-01-21 DIAGNOSIS — N23 Unspecified renal colic: Secondary | ICD-10-CM

## 2017-01-21 DIAGNOSIS — Z0130 Encounter for examination of blood pressure without abnormal findings: Secondary | ICD-10-CM

## 2017-01-21 LAB — POCT URINALYSIS DIPSTICK
GLUCOSE UA: NEGATIVE
Ketones, UA: NEGATIVE
NITRITE UA: NEGATIVE

## 2017-01-25 ENCOUNTER — Ambulatory Visit: Payer: BLUE CROSS/BLUE SHIELD | Admitting: Obstetrics & Gynecology

## 2017-01-31 ENCOUNTER — Encounter: Payer: Self-pay | Admitting: Obstetrics & Gynecology

## 2017-02-09 ENCOUNTER — Ambulatory Visit: Payer: BLUE CROSS/BLUE SHIELD

## 2017-02-09 ENCOUNTER — Ambulatory Visit (INDEPENDENT_AMBULATORY_CARE_PROVIDER_SITE_OTHER): Payer: BLUE CROSS/BLUE SHIELD | Admitting: Women's Health

## 2017-02-09 ENCOUNTER — Encounter: Payer: Self-pay | Admitting: *Deleted

## 2017-02-09 ENCOUNTER — Ambulatory Visit (INDEPENDENT_AMBULATORY_CARE_PROVIDER_SITE_OTHER): Payer: BLUE CROSS/BLUE SHIELD | Admitting: *Deleted

## 2017-02-09 ENCOUNTER — Encounter: Payer: Self-pay | Admitting: Women's Health

## 2017-02-09 ENCOUNTER — Encounter: Payer: Self-pay | Admitting: Obstetrics & Gynecology

## 2017-02-09 VITALS — Wt 222.2 lb

## 2017-02-09 DIAGNOSIS — Z3202 Encounter for pregnancy test, result negative: Secondary | ICD-10-CM

## 2017-02-09 DIAGNOSIS — O1494 Unspecified pre-eclampsia, complicating childbirth: Secondary | ICD-10-CM | POA: Insufficient documentation

## 2017-02-09 DIAGNOSIS — Z3042 Encounter for surveillance of injectable contraceptive: Secondary | ICD-10-CM

## 2017-02-09 DIAGNOSIS — Z98891 History of uterine scar from previous surgery: Secondary | ICD-10-CM | POA: Insufficient documentation

## 2017-02-09 LAB — POCT URINE PREGNANCY: Preg Test, Ur: NEGATIVE

## 2017-02-09 MED ORDER — MEDROXYPROGESTERONE ACETATE 150 MG/ML IM SUSP: 150.0000 mg | INTRAMUSCULAR | 3 refills | Status: DC

## 2017-02-09 MED ORDER — MEDROXYPROGESTERONE ACETATE 150 MG/ML IM SUSP
150.0000 mg | Freq: Once | INTRAMUSCULAR | Status: AC
  Administered 2017-02-09: 150 mg via INTRAMUSCULAR

## 2017-02-09 NOTE — Progress Notes (Signed)
Subjective:    Mckenzie Cooley is a 26 y.o. [phone] Caucasian female who presents for a postpartum visit. She is 5 weeks postpartum following a primary cesarean section, low transverse incision at 40.4 gestational weeks after FTP at 6.5cm/90/0to+1 after IOL for pre-e w/o severe features. Anesthesia: epidural. I have fully reviewed the prenatal and intrapartum course. Postpartum course has been complicated by severe anemia w/ hgb 5.8 requiring 2u PRBC on day 4 post-op, then came to office on day 5 for bp check- was noted to have severe-range bp's and was readmitted to [facility] for severe pp pre-e, received mag, d/c'd home on norvasc and lasix. Hgb on 7/19 was up to 8.5, still taking Fe. Diazide was added 2d after d/c and lasix was stopped. Per Dr. Brynda Greathouse note 7/23, plan is to remove norvasc first. Reports continued daily headaches since having baby. Not sleeping well, baby 'has days/nights mixed'. Eating well and staying hydrated per her report. Denies visual changes, ruq/epigastric pain, n/v.  Baby's course has been uncomplicated. Baby is feeding by bottle. Bleeding thin lochia. Bowel function is normal. Bladder function is normal. Patient is not sexually active. Last sexual activity: prior to birth of baby. Contraception method is wants depo. Postpartum depression screening: negative. Score 3.  Last pap 06/18/16 and was neg.  The following portions of the patient's history were reviewed and updated as appropriate: allergies, current medications, past medical history, past surgical history and problem list.  Review of Systems Pertinent items are noted in HPI.   Vitals:   02/09/17 1047  BP: 130/80  Pulse: 80  Weight: 222 lb 6.4 oz (100.9 kg)   Patient's last menstrual period was 01/25/2016 (lmp unknown).  Objective:   General:  alert, cooperative and no distress   Breasts:  deferred, no complaints  Lungs: clear to auscultation bilaterally  Heart:  regular rate and rhythm  Abdomen: soft, nontender,  c/s incision well-healed   Vulva: normal  Vagina: normal vagina  Cervix:  closed  Corpus: Well-involuted  Adnexa:  Non-palpable  Rectal Exam: No hemorrhoids        Assessment:   Postpartum exam 5 wks s/p PLTCS after failed IOL for pre-e Readmited for severe PP pre-e Bottlefeeding Anemia Headaches Depression screening Contraception counseling   Plan:  Stop norvasc, continue diazide for now Gave printed info on headache prevention/relief Continue Fe Contraception: rx depo w/ 3RF, condoms x 2wks Follow up in: today for 1st depo, then 2wks for bp f/u   Tawnya Crook CNM, WHNP-BC 02/09/2017 11:00 AM

## 2017-02-09 NOTE — Patient Instructions (Addendum)
Stop Norvasc  Condoms x 2 weeks  For Headaches:   Stay well hydrated, drink enough water so that your urine is clear, sometimes if you are dehydrated you can get headaches  Eat small frequent meals and snacks, sometimes if you are hungry you can get headaches  Sometimes you get headaches during pregnancy from the pregnancy hormones  You can try tylenol (1-2 regular strength 325mg  or 1-2 extra strength 500mg ) as directed on the box. The least amount of medication that works is best.   Cool compresses (cool wet washcloth or ice pack) to area of head that is hurting  You can also try drinking a caffeinated drink to see if this will help  If not helping, try below:  For Prevention of Headaches/Migraines:  CoQ10 100mg  three times daily  Vitamin B2 400mg  daily  Magnesium Oxide 400-600mg  daily  If You Get a Bad Headache/Migraine:  Benadryl 25mg    Magnesium Oxide  1 large Gatorade  2 extra strength Tylenol (1,000mg  total)  1 cup coffee or Coke  If this doesn't help please call us @ [phone]    Medroxyprogesterone injection [Contraceptive] What is this medicine? MEDROXYPROGESTERONE (me DROX ee proe JES te rone) contraceptive injections prevent pregnancy. They provide effective birth control for 3 months. Depo-subQ Provera 104 is also used for treating pain related to endometriosis. This medicine may be used for other purposes; ask your health care provider or pharmacist if you have questions. COMMON BRAND NAME(S): Depo-Provera, Depo-subQ Provera 104 What should I tell my health care provider before I take this medicine? They need to know if you have any of these conditions: -frequently drink alcohol -asthma -blood vessel disease or a history of a blood clot in the lungs or legs -bone disease such as osteoporosis -breast cancer -diabetes -eating disorder (anorexia nervosa or bulimia) -high blood pressure -HIV infection or AIDS -kidney disease -liver  disease -mental depression -migraine -seizures (convulsions) -stroke -tobacco smoker -vaginal bleeding -an unusual or allergic reaction to medroxyprogesterone, other hormones, medicines, foods, dyes, or preservatives -pregnant or trying to get pregnant -breast-feeding How should I use this medicine? Depo-Provera Contraceptive injection is given into a muscle. Depo-subQ Provera 104 injection is given under the skin. These injections are given by a health care professional. You must not be pregnant before getting an injection. The injection is usually given during the first 5 days after the start of a menstrual period or 6 weeks after delivery of a baby. Talk to your pediatrician regarding the use of this medicine in children. Special care may be needed. These injections have been used in female children who have started having menstrual periods. Overdosage: If you think you have taken too much of this medicine contact a poison control center or emergency room at once. NOTE: This medicine is only for you. Do not share this medicine with others. What if I miss a dose? Try not to miss a dose. You must get an injection once every 3 months to maintain birth control. If you cannot keep an appointment, call and reschedule it. If you wait longer than 13 weeks between Depo-Provera contraceptive injections or longer than 14 weeks between Depo-subQ Provera 104 injections, you could get pregnant. Use another method for birth control if you miss your appointment. You may also need a pregnancy test before receiving another injection. What may interact with this medicine? Do not take this medicine with any of the following medications: -bosentan This medicine may also interact with the following medications: -aminoglutethimide -antibiotics  or medicines for infections, especially rifampin, rifabutin, rifapentine, and griseofulvin -aprepitant -barbiturate medicines such as phenobarbital or  primidone -bexarotene -carbamazepine -medicines for seizures like ethotoin, felbamate, oxcarbazepine, phenytoin, topiramate -modafinil -St. John's wort This list may not describe all possible interactions. Give your health care provider a list of all the medicines, herbs, non-prescription drugs, or dietary supplements you use. Also tell them if you smoke, drink alcohol, or use illegal drugs. Some items may interact with your medicine. What should I watch for while using this medicine? This drug does not protect you against HIV infection (AIDS) or other sexually transmitted diseases. Use of this product may cause you to lose calcium from your bones. Loss of calcium may cause weak bones (osteoporosis). Only use this product for more than 2 years if other forms of birth control are not right for you. The longer you use this product for birth control the more likely you will be at risk for weak bones. Ask your health care professional how you can keep strong bones. You may have a change in bleeding pattern or irregular periods. Many females stop having periods while taking this drug. If you have received your injections on time, your chance of being pregnant is very low. If you think you may be pregnant, see your health care professional as soon as possible. Tell your health care professional if you want to get pregnant within the next year. The effect of this medicine may last a long time after you get your last injection. What side effects may I notice from receiving this medicine? Side effects that you should report to your doctor or health care professional as soon as possible: -allergic reactions like skin rash, itching or hives, swelling of the face, lips, or tongue -breast tenderness or discharge -breathing problems -changes in vision -depression -feeling faint or lightheaded, falls -fever -pain in the abdomen, chest, groin, or leg -problems with balance, talking, walking -unusually weak  or tired -yellowing of the eyes or skin Side effects that usually do not require medical attention (report to your doctor or health care professional if they continue or are bothersome): -acne -fluid retention and swelling -headache -irregular periods, spotting, or absent periods -temporary pain, itching, or skin reaction at site where injected -weight gain This list may not describe all possible side effects. Call your doctor for medical advice about side effects. You may report side effects to FDA at 1-800-FDA-1088. Where should I keep my medicine? This does not apply. The injection will be given to you by a health care professional. NOTE: This sheet is a summary. It may not cover all possible information. If you have questions about this medicine, talk to your doctor, pharmacist, or health care provider.  2018 Elsevier/Gold Standard (2008-06-29 18:37:56)

## 2017-02-09 NOTE — Progress Notes (Signed)
Depo Provera 150mg  IM given in left deltoid with no complications. Patient to return in 12 weeks for next injection.

## 2017-02-18 ENCOUNTER — Telehealth: Payer: Self-pay | Admitting: *Deleted

## 2017-02-18 NOTE — Telephone Encounter (Signed)
Patient called stating she needed a note stating she could return to work on 03/01/17 with no restrictions. Informed patient she had appointment on 02/23/17 and could get then but stated she needed sooner if possible. Will get note and fax.

## 2017-02-23 ENCOUNTER — Ambulatory Visit (INDEPENDENT_AMBULATORY_CARE_PROVIDER_SITE_OTHER): Payer: BLUE CROSS/BLUE SHIELD | Admitting: Adult Health

## 2017-02-23 ENCOUNTER — Encounter: Payer: Self-pay | Admitting: Adult Health

## 2017-02-23 VITALS — BP 124/78 | HR 86 | Ht 60.0 in | Wt 224.0 lb

## 2017-02-23 DIAGNOSIS — Z8759 Personal history of other complications of pregnancy, childbirth and the puerperium: Secondary | ICD-10-CM | POA: Diagnosis not present

## 2017-02-23 DIAGNOSIS — Z862 Personal history of diseases of the blood and blood-forming organs and certain disorders involving the immune mechanism: Secondary | ICD-10-CM

## 2017-02-23 NOTE — Progress Notes (Signed)
Subjective:     Patient ID: Mckenzie Cooley, female   DOB: [birth year], 26 y.o.   MRN: [id number]  HPI Mckenzie Cooley is a 26 year old white female in for BP and ROS.Stil has dull headache but has had since had blood transfusion she says. Has been off all BP meds for almost 2 weeks, still takes iron. She is supposed to return I work 9/10 will talk after labs back, but should be able to return as scheduled.   Review of Systems +Headache Reviewed past medical,surgical, social and family history. Reviewed medications and allergies.     Objective:   Physical Exam BP 124/78 (BP Location: Right Arm, Patient Position: Sitting, Cuff Size: Normal)   Pulse 86   Ht 5' (1.524 m)   Wt 224 lb (101.6 kg)   Breastfeeding? No   BMI 43.75 kg/m  Skin warm and dry.Lungs: clear to ausculation bilaterally. Cardiovascular: regular rate and rhythm.    Assessment:       1. History of severe pre-eclampsia   2. History of anemia       Plan:     Check CBC and CMP Follow up in 2 weeks

## 2017-02-24 ENCOUNTER — Encounter: Payer: Self-pay | Admitting: Adult Health

## 2017-02-24 LAB — COMPREHENSIVE METABOLIC PANEL
A/G RATIO: 1.3 (ref 1.2–2.2)
ALK PHOS: 95 IU/L (ref 39–117)
ALT: 25 IU/L (ref 0–32)
AST: 16 IU/L (ref 0–40)
Albumin: 4 g/dL (ref 3.5–5.5)
BUN/Creatinine Ratio: 19 (ref 9–23)
BUN: 15 mg/dL (ref 6–20)
Bilirubin Total: 0.5 mg/dL (ref 0.0–1.2)
CALCIUM: 9 mg/dL (ref 8.7–10.2)
CO2: 21 mmol/L (ref 20–29)
Chloride: 105 mmol/L (ref 96–106)
Creatinine, Ser: 0.79 mg/dL (ref 0.57–1.00)
GFR calc Af Amer: 119 mL/min/{1.73_m2} (ref >59)
GFR, EST NON AFRICAN AMERICAN: 104 mL/min/{1.73_m2} (ref >59)
GLOBULIN, TOTAL: 3 g/dL (ref 1.5–4.5)
Glucose: 92 mg/dL (ref 65–99)
POTASSIUM: 4.4 mmol/L (ref 3.5–5.2)
SODIUM: 143 mmol/L (ref 134–144)
Total Protein: 7 g/dL (ref 6.0–8.5)

## 2017-02-24 LAB — CBC
Hematocrit: 38.9 % (ref 34.0–46.6)
Hemoglobin: 12.4 g/dL (ref 11.1–15.9)
MCH: 25.4 pg — ABNORMAL LOW (ref 26.6–33.0)
MCHC: 31.9 g/dL (ref 31.5–35.7)
MCV: 80 fL (ref 79–97)
PLATELETS: 335 10*3/uL (ref 150–379)
RBC: 4.88 x10E6/uL (ref 3.77–5.28)
RDW: 16.3 % — AB (ref 12.3–15.4)
WBC: 7 10*3/uL (ref 3.4–10.8)

## 2017-03-01 ENCOUNTER — Telehealth: Payer: Self-pay | Admitting: *Deleted

## 2017-03-01 NOTE — Telephone Encounter (Signed)
Patient states she needs a note for return to work today. Fax: [phone] Email to: [email or web address]

## 2017-03-11 ENCOUNTER — Ambulatory Visit (INDEPENDENT_AMBULATORY_CARE_PROVIDER_SITE_OTHER): Payer: BLUE CROSS/BLUE SHIELD | Admitting: Adult Health

## 2017-03-11 ENCOUNTER — Encounter: Payer: Self-pay | Admitting: Adult Health

## 2017-03-11 VITALS — BP 120/80 | HR 72 | Ht 60.0 in | Wt 224.0 lb

## 2017-03-11 DIAGNOSIS — J0140 Acute pansinusitis, unspecified: Secondary | ICD-10-CM | POA: Diagnosis not present

## 2017-03-11 MED ORDER — AZITHROMYCIN 250 MG PO TABS: ORAL_TABLET | ORAL | 0 refills | Status: DC

## 2017-03-11 MED ORDER — CETIRIZINE-PSEUDOEPHEDRINE ER 5-120 MG PO TB12: 1.0000 | ORAL_TABLET | Freq: Two times a day (BID) | ORAL | 0 refills | Status: DC

## 2017-03-11 NOTE — Patient Instructions (Signed)
Sinus Headache A sinus headache occurs when the paranasal sinuses become clogged or swollen. Paranasal sinuses are air pockets within the bones of the face. Sinus headaches can range from mild to severe. What are the causes? A sinus headache can result from various conditions that affect the sinuses, such as:  Colds.  Sinus infections.  Allergies.  What are the signs or symptoms? The main symptom of this condition is a headache that may feel like pain or pressure in the face, forehead, ears, or upper teeth. People who have a sinus headache often have other symptoms, such as:  Congested or runny nose.  Fever.  Inability to smell.  Weather changes can make symptoms worse. How is this diagnosed? This condition may be diagnosed based on:  A physical exam and medical history.  Imaging tests, such as a CT scan and MRI, to check for problems with the sinuses.  A specialist may look into the sinuses with a tool that has a camera (endoscopy).  How is this treated? Treatment for this condition depends on the cause.  Sinus pain that is caused by a sinus infection may be treated with antibiotic medicine.  Sinus pain that is caused by allergies may be helped by allergy medicines (antihistamines) and medicated nasal sprays.  Sinus pain that is caused by congestion may be helped by flushing the nose and sinuses with saline solution.  Follow these instructions at home:  Take medicines only as directed by your health care provider.  If you were prescribed an antibiotic medicine, finish all of it even if you start to feel better.  If you have congestion, use a nasal spray to help reduce pressure.  If directed, apply a warm, moist washcloth to your face to help relieve pain. Contact a health care provider if:  You have headaches more than one time each week.  You have sensitivity to light or sound.  You have a fever.  You feel sick to your stomach (nauseous) or you throw up  (vomit).  Your headaches do not get better with treatment. Many people think that they have a sinus headache when they actually have migraines or tension headaches. Get help right away if:  You have vision problems.  You have sudden, severe pain in your face or head.  You have a seizure.  You are confused.  You have a stiff neck. This information is not intended to replace advice given to you by your health care provider. Make sure you discuss any questions you have with your health care provider. Document Released: 07/16/2004 Document Revised: 02/02/2016 Document Reviewed: 06/04/2014 Elsevier Interactive Patient Education  2018 Elsevier Inc.  

## 2017-03-11 NOTE — Progress Notes (Signed)
Subjective:     Patient ID: Mckenzie Cooley, female   DOB: 1990-10-01, 26 y.o.   MRN: [id number]  HPI Mckenzie Cooley is a 26 year old white female, back in follow up of headache, after blood transfusion(she also had severe pre-eclampsia). Headache seems worse lately, esp since being back at work, she says she gets hot and has some congestion and nausea.   Review of Systems + headache +congestion +nausea at times, esp when gets hot at work Reviewed past medical,surgical, social and family history. Reviewed medications and allergies.     Objective:   Physical Exam BP 120/80 (BP Location: Left Arm, Patient Position: Sitting, Cuff Size: Large)   Pulse 72   Ht 5' (1.524 m)   Wt 224 lb (101.6 kg)   Breastfeeding? No   BMI 43.75 kg/m  Skin warm and dry. Neck: mid line trachea, normal thyroid, good ROM, no lymphadenopathy noted. Lungs: clear to ausculation bilaterally. Cardiovascular: regular rate and rhythm.+tenderness over frontal and maxillary sinus area, and both ears in canal, TM not bulging, CN 2-12 intact, +dizzy with position change.    Assessment:     Sinus infection    Plan:        Meds ordered this encounter  Medications  . azithromycin (ZITHROMAX) 250 MG tablet    Sig: Take 2 now and 1 daily for 4 days    Dispense:  6 tablet    Refill:  0    Order Specific Question:   Supervising Provider    Answer:   Elonda Husky, LUTHER H [[id number]]  . cetirizine-pseudoephedrine (ZYRTEC-D ALLERGY & CONGESTION) 5-120 MG tablet    Sig: Take 1 tablet by mouth 2 (two) times daily.    Dispense:  20 tablet    Refill:  0    Order Specific Question:   Supervising Provider    Answer:   Florian Buff [[id number]]  Push fluids Note given to return to work 9/23 Follow  Up prn

## 2017-03-18 ENCOUNTER — Ambulatory Visit: Payer: BLUE CROSS/BLUE SHIELD | Admitting: Adult Health

## 2017-03-24 ENCOUNTER — Ambulatory Visit (INDEPENDENT_AMBULATORY_CARE_PROVIDER_SITE_OTHER): Payer: BLUE CROSS/BLUE SHIELD | Admitting: Adult Health

## 2017-03-24 ENCOUNTER — Encounter: Payer: Self-pay | Admitting: Adult Health

## 2017-03-24 VITALS — BP 122/70 | HR 61 | Ht 60.0 in | Wt 227.5 lb

## 2017-03-24 DIAGNOSIS — H6593 Unspecified nonsuppurative otitis media, bilateral: Secondary | ICD-10-CM | POA: Diagnosis not present

## 2017-03-24 DIAGNOSIS — G4489 Other headache syndrome: Secondary | ICD-10-CM | POA: Diagnosis not present

## 2017-03-24 DIAGNOSIS — J069 Acute upper respiratory infection, unspecified: Secondary | ICD-10-CM

## 2017-03-24 MED ORDER — NEOMYCIN-POLYMYXIN-HC 3.5-10000-1 OT SUSP: 3.0000 [drp] | Freq: Four times a day (QID) | OTIC | 0 refills | Status: DC

## 2017-03-24 MED ORDER — ONDANSETRON HCL 4 MG PO TABS: 4.0000 mg | ORAL_TABLET | Freq: Three times a day (TID) | ORAL | 0 refills | Status: DC | PRN

## 2017-03-24 MED ORDER — DOXYCYCLINE HYCLATE 100 MG PO TBEC: 100.0000 mg | DELAYED_RELEASE_TABLET | Freq: Two times a day (BID) | ORAL | 0 refills | Status: DC

## 2017-03-24 NOTE — Progress Notes (Signed)
Subjective:     Patient ID: Mckenzie Cooley, female   DOB: [birth year], 26 y.o.   MRN: [id number]  HPI Mckenzie Cooley is a 26 year old white female, back in follow up of recent sinus infection and headache since delivery. Felt better on Z pack but still has headache, congestion,nausea and ears itch.  Review of Systems +headache +nausea about 3 nights a week Ears itch Hot flashes better Still with some congestion,has to clear throat   Reviewed past medical,surgical, social and family history. Reviewed medications and allergies.     Objective:   Physical Exam BP 122/70 (BP Location: Left Arm, Patient Position: Sitting, Cuff Size: Large)   Pulse 61   Ht 5' (1.524 m)   Wt 227 lb 8 oz (103.2 kg)   LMP 03/22/2017   Breastfeeding? No   BMI 44.43 kg/m  Skin warm and dry. Lungs: clear to ausculation bilaterally. Cardiovascular: regular rate and rhythm.   Throat red,frontal sinus still tender, both ears red in canal and possible fluid behind right ear TM.  Assessment:     1. Other headache syndrome   2. Viral upper respiratory tract infection   3. Allergic otitis media of both ears, unspecified chronicity       Plan:     Meds ordered this encounter  Medications  . acetaminophen (TYLENOL) 500 MG tablet    Sig: Take 1,000 mg by mouth daily as needed.  . neomycin-polymyxin-hydrocortisone (CORTISPORIN) 3.5-10000-1 OTIC suspension    Sig: Place 3 drops into both ears 4 (four) times daily.    Dispense:  10 mL    Refill:  0    Order Specific Question:   Supervising Provider    Answer:   Elonda Husky, LUTHER H [[id number]]  . doxycycline (DORYX) 100 MG EC tablet    Sig: Take 1 tablet (100 mg total) by mouth 2 (two) times daily.    Dispense:  20 tablet    Refill:  0    Order Specific Question:   Supervising Provider    Answer:   Elonda Husky, LUTHER H [[id number]]  . ondansetron (ZOFRAN) 4 MG tablet    Sig: Take 1 tablet (4 mg total) by mouth every 8 (eight) hours as needed for nausea or vomiting.    Dispense:  20  tablet    Refill:  0    Order Specific Question:   Supervising Provider    Answer:   Florian Buff [[id number]]  Push fluids  Follow up in 2 weeks if not better, refer to ENT

## 2017-03-24 NOTE — Patient Instructions (Signed)
F/U in 2 weeks  Push fluids

## 2017-04-07 ENCOUNTER — Ambulatory Visit (INDEPENDENT_AMBULATORY_CARE_PROVIDER_SITE_OTHER): Payer: BLUE CROSS/BLUE SHIELD | Admitting: Adult Health

## 2017-04-07 ENCOUNTER — Encounter: Payer: Self-pay | Admitting: Adult Health

## 2017-04-07 VITALS — BP 132/60 | HR 72 | Ht 60.0 in | Wt 232.5 lb

## 2017-04-07 DIAGNOSIS — H6593 Unspecified nonsuppurative otitis media, bilateral: Secondary | ICD-10-CM

## 2017-04-07 DIAGNOSIS — G4489 Other headache syndrome: Secondary | ICD-10-CM

## 2017-04-07 MED ORDER — FLUCONAZOLE 150 MG PO TABS: ORAL_TABLET | ORAL | 1 refills | Status: DC

## 2017-04-07 NOTE — Progress Notes (Signed)
Subjective:     Patient ID: Mckenzie Cooley, female   DOB: 1990/08/25, 26 y.o.   MRN: [id number]  HPI Mckenzie Cooley is a 26 year old white female, back in follow up on headache and sinus infection and still has headache almost daily esp when works and ears itch now.   Review of Systems +headache, almost daily Ears itch now Reviewed past medical,surgical, social and family history. Reviewed medications and allergies.     Objective:   Physical Exam BP 132/60 (BP Location: Left Arm, Patient Position: Sitting, Cuff Size: Large)   Pulse 72   Ht 5' (1.524 m)   Wt 232 lb 8 oz (105.5 kg)   LMP 03/22/2017   Breastfeeding? No   BMI 45.41 kg/m Skin warm and dry, throat clear, both reds red in canal, +tenderness over sinuses. Will refer to Dr Benjamine Mola.    Assessment:     1. Other headache syndrome   2. Allergic otitis media of both ears, unspecified chronicity       Plan:     Meds ordered this encounter  Medications  . fluconazole (DIFLUCAN) 150 MG tablet    Sig: Take 1 now and 1 in 3 days    Dispense:  2 tablet    Refill:  1    Order Specific Question:   Supervising Provider    Answer:   Florian Buff [[id number]]  Referred to Dr Benjamine Mola

## 2017-04-20 ENCOUNTER — Encounter: Payer: Self-pay | Admitting: Adult Health

## 2017-05-04 ENCOUNTER — Ambulatory Visit (INDEPENDENT_AMBULATORY_CARE_PROVIDER_SITE_OTHER): Payer: BLUE CROSS/BLUE SHIELD | Admitting: Obstetrics & Gynecology

## 2017-05-04 ENCOUNTER — Other Ambulatory Visit (HOSPITAL_COMMUNITY)
Admission: RE | Admit: 2017-05-04 | Discharge: 2017-05-04 | Disposition: A | Discharge status: Home / Self Care | Payer: BLUE CROSS/BLUE SHIELD | Source: Ambulatory Visit | Attending: Obstetrics & Gynecology | Admitting: Obstetrics & Gynecology

## 2017-05-04 ENCOUNTER — Other Ambulatory Visit: Payer: Self-pay | Admitting: *Deleted

## 2017-05-04 ENCOUNTER — Ambulatory Visit: Payer: BLUE CROSS/BLUE SHIELD

## 2017-05-04 ENCOUNTER — Encounter: Payer: Self-pay | Admitting: Obstetrics & Gynecology

## 2017-05-04 ENCOUNTER — Telehealth: Payer: Self-pay | Admitting: Adult Health

## 2017-05-04 VITALS — BP 126/80 | HR 74 | Ht 60.0 in | Wt 234.0 lb

## 2017-05-04 DIAGNOSIS — H6593 Unspecified nonsuppurative otitis media, bilateral: Secondary | ICD-10-CM

## 2017-05-04 DIAGNOSIS — Z01419 Encounter for gynecological examination (general) (routine) without abnormal findings: Secondary | ICD-10-CM | POA: Insufficient documentation

## 2017-05-04 MED ORDER — DESOGESTREL-ETHINYL ESTRADIOL 0.15-0.02/0.01 MG (21/5) PO TABS: 1.0000 | ORAL_TABLET | Freq: Every day | ORAL | 11 refills | Status: DC

## 2017-05-04 MED ORDER — DESOGESTREL-ETHINYL ESTRADIOL 0.15-0.02/0.01 MG (21/5) PO TABS
1.0000 | ORAL_TABLET | Freq: Every day | ORAL | 11 refills | Status: DC
Start: 2017-05-04 — End: 2017-07-01

## 2017-05-04 NOTE — Progress Notes (Signed)
Subjective:     Mckenzie Cooley is a 26 y.o. female here for a routine exam.  Patient's last menstrual period was 03/22/2017. [id number] Birth Control Method:  Tried depo x1 but had significant weight gain and would like to change to OCPs  Current complaints: no other complaints.     Recent Gynecologic History Patient's last menstrual period was 03/22/2017. Last Pap: never Last mammogram: never   Past Medical History:  Diagnosis Date  . Acid reflux Dx 2011  . Anemia   . Asthma    As a Child  . Bloating 2011  . Hypertension    PIH  . PCOS (polycystic ovarian syndrome) 09/27/2014  . Weight loss, abnormal     Past Surgical History:  Procedure Laterality Date  . CHOLECYSTECTOMY  2011   Dr.Byerly  . UPPER GASTROINTESTINAL ENDOSCOPY  12/05/2009  . UPPER GASTROINTESTINAL ENDOSCOPY  02/07/2009  . WISDOM TOOTH EXTRACTION    . WISDOM TOOTH EXTRACTION      OB History    Gravida Para Term Preterm AB Living   4 1 1   3 1    SAB TAB Ectopic Multiple Live Births   3 0 0 0 1      Social History   Socioeconomic History  . Marital status: Married    Spouse name: None  . Number of children: None  . Years of education: None  . Highest education level: None  Social Needs  . Financial resource strain: None  . Food insecurity - worry: None  . Food insecurity - inability: None  . Transportation needs - medical: None  . Transportation needs - non-medical: None  Occupational History  . None  Tobacco Use  . Smoking status: Former Smoker    Packs/day: 0.50    Years: 12.00    Pack years: 6.00    Types: Cigarettes  . Smokeless tobacco: Never Used  Substance and Sexual Activity  . Alcohol use: No    Alcohol/week: 0.0 oz  . Drug use: No  . Sexual activity: Yes    Birth control/protection: Injection  Other Topics Concern  . None  Social History Narrative  . None    Family History  Problem Relation Age of Onset  . Hypertension Mother   . Heart disease Father   . Cancer -  Cervical Maternal Aunt   . Kidney disease Maternal Grandmother   . Renal Disease Maternal Grandmother      Current Outpatient Medications:  .  acetaminophen (TYLENOL) 500 MG tablet, Take 1,000 mg by mouth daily as needed., Disp: , Rfl:  .  ferrous sulfate (FERROUSUL) 325 (65 FE) MG tablet, Take 1 tablet (325 mg total) by mouth 2 (two) times daily., Disp: 60 tablet, Rfl: 1 .  desogestrel-ethinyl estradiol (KARIVA) 0.15-0.02/0.01 MG (21/5) tablet, Take 1 tablet daily by mouth., Disp: 1 Package, Rfl: 11 .  fluconazole (DIFLUCAN) 150 MG tablet, Take 1 now and 1 in 3 days (Patient not taking: Reported on 05/04/2017), Disp: 2 tablet, Rfl: 1 .  medroxyPROGESTERone (DEPO-PROVERA) 150 MG/ML injection, Inject 1 mL (150 mg total) into the muscle every 3 (three) months. (Patient not taking: Reported on 05/04/2017), Disp: 1 mL, Rfl: 3 .  neomycin-polymyxin-hydrocortisone (CORTISPORIN) 3.5-10000-1 OTIC suspension, Place 3 drops into both ears 4 (four) times daily. (Patient not taking: Reported on 05/04/2017), Disp: 10 mL, Rfl: 0 .  ondansetron (ZOFRAN) 4 MG tablet, Take 1 tablet (4 mg total) by mouth every 8 (eight) hours as needed for nausea or vomiting. (Patient  not taking: Reported on 05/04/2017), Disp: 20 tablet, Rfl: 0  Review of Systems  Review of Systems  Constitutional: Negative for fever, chills, weight loss, malaise/fatigue and diaphoresis.  HENT: Negative for hearing loss, ear pain, nosebleeds, congestion, sore throat, neck pain, tinnitus and ear discharge.   Eyes: Negative for blurred vision, double vision, photophobia, pain, discharge and redness.  Respiratory: Negative for cough, hemoptysis, sputum production, shortness of breath, wheezing and stridor.   Cardiovascular: Negative for chest pain, palpitations, orthopnea, claudication, leg swelling and PND.  Gastrointestinal: negative for abdominal pain. Negative for heartburn, nausea, vomiting, diarrhea, constipation, blood in stool and melena.   Genitourinary: Negative for dysuria, urgency, frequency, hematuria and flank pain.  Musculoskeletal: Negative for myalgias, back pain, joint pain and falls.  Skin: Negative for itching and rash.  Neurological: Negative for dizziness, tingling, tremors, sensory change, speech change, focal weakness, seizures, loss of consciousness, weakness and headaches.  Endo/Heme/Allergies: Negative for environmental allergies and polydipsia. Does not bruise/bleed easily.  Psychiatric/Behavioral: Negative for depression, suicidal ideas, hallucinations, memory loss and substance abuse. The patient is not nervous/anxious and does not have insomnia.        Objective:  Blood pressure 126/80, pulse 74, height 5' (1.524 m), weight 234 lb (106.1 kg), last menstrual period 03/22/2017, not currently breastfeeding.   Physical Exam  Vitals reviewed. Constitutional: She is oriented to person, place, and time. She appears well-developed and well-nourished.  HENT:  Head: Normocephalic and atraumatic.        Right Ear: External ear normal.  Left Ear: External ear normal.  Nose: Nose normal.  Mouth/Throat: Oropharynx is clear and moist.  Eyes: Conjunctivae and EOM are normal. Pupils are equal, round, bilaterally. Right eye exhibits no discharge. Left eye exhibits no discharge. No scleral icterus.  Neck: Normal range of motion. Neck supple. No tracheal deviation present. No thyromegaly present.  Cardiovascular: Normal rate, regular rhythm, normal heart sounds and intact distal pulses.  Exam reveals no gallop and no friction rub.   No murmur heard. Respiratory: Effort normal and breath sounds normal. No respiratory distress. She has no wheezes. She has no rales. She exhibits no tenderness.  GI: Soft. Bowel sounds are normal. She exhibits no distension and no mass. There is no tenderness. There is no rebound and no guarding.  Genitourinary:       Vulva is normal without lesions Vagina is pink moist without  discharge Cervix normal in appearance and pap is done Uterus is normal size shape and contour Adnexa is negative with normal sized ovaries  Musculoskeletal: Normal range of motion. She exhibits no edema and no tenderness.  Neurological: She is alert and oriented to person, place, and time. She has normal reflexes. She displays normal reflexes. No cranial nerve deficit. She exhibits normal muscle tone. Coordination normal.  Skin: Skin is warm and dry. No rash noted. No erythema. No pallor.  Psychiatric: She has a normal mood and affect. Her behavior is normal. Judgment and thought content normal.       Medications Ordered at today's visit: Meds ordered this encounter  Medications  . DISCONTD: desogestrel-ethinyl estradiol (KARIVA) 0.15-0.02/0.01 MG (21/5) tablet    Sig: Take 1 tablet daily by mouth.    Dispense:  1 Package    Refill:  11  . desogestrel-ethinyl estradiol (KARIVA) 0.15-0.02/0.01 MG (21/5) tablet    Sig: Take 1 tablet daily by mouth.    Dispense:  1 Package    Refill:  11    Other orders placed at  today's visit: No orders of the defined types were placed in this encounter.     Assessment:    Healthy female exam.    Plan:    Contraception: OCP (estrogen/progesterone). Follow up in: 1 year.     Return in about 1 year (around 05/04/2018) for annual.   Bufford Lope, DO PGY-2, [facility] 05/04/2017 12:17 PM

## 2017-05-04 NOTE — Progress Notes (Signed)
Subjective:     Mckenzie Cooley is a 26 y.o. female here for a routine exam.  Patient's last menstrual period was 03/22/2017. [id number] Birth Control Method:  Depo provera Menstrual Calendar(currently): amenorrheic  Current complaints: weight gain on depo.   Current acute medical issues:  none   Recent Gynecologic History Patient's last menstrual period was 03/22/2017. Last Pap: 2017,  normal Last mammogram: ,    Past Medical History:  Diagnosis Date  . Acid reflux Dx 2011  . Anemia   . Asthma    As a Child  . Bloating 2011  . Hypertension    PIH  . PCOS (polycystic ovarian syndrome) 09/27/2014  . Weight loss, abnormal     Past Surgical History:  Procedure Laterality Date  . CESAREAN SECTION N/A 01/02/2017   Procedure: CESAREAN SECTION;  Surgeon: Chancy Milroy, MD;  Location: [facility];  Service: Obstetrics;  Laterality: N/A;  . CHOLECYSTECTOMY  2011   Dr.Byerly  . ESOPHAGOGASTRODUODENOSCOPY N/A 10/26/2014   Procedure: ESOPHAGOGASTRODUODENOSCOPY (EGD);  Surgeon: Rogene Houston, MD;  [facility];  Service: Endoscopy;  Laterality: N/A;  . UPPER GASTROINTESTINAL ENDOSCOPY  12/05/2009  . UPPER GASTROINTESTINAL ENDOSCOPY  02/07/2009  . WISDOM TOOTH EXTRACTION    . WISDOM TOOTH EXTRACTION      OB History    Gravida Para Term Preterm AB Living   4 1 1   3 1    SAB TAB Ectopic Multiple Live Births   3 0 0 0 1      Social History   Socioeconomic History  . Marital status: Married    Spouse name: None  . Number of children: None  . Years of education: None  . Highest education level: None  Social Needs  . Financial resource strain: None  . Food insecurity - worry: None  . Food insecurity - inability: None  . Transportation needs - medical: None  . Transportation needs - non-medical: None  Occupational History  . None  Tobacco Use  . Smoking status: Former Smoker    Packs/day: 0.50    Years: 12.00    Pack years: 6.00    Types: Cigarettes  .  Smokeless tobacco: Never Used  Substance and Sexual Activity  . Alcohol use: No    Alcohol/week: 0.0 oz  . Drug use: No  . Sexual activity: Yes    Birth control/protection: Injection  Other Topics Concern  . None  Social History Narrative  . None    Family History  Problem Relation Age of Onset  . Hypertension Mother   . Heart disease Father   . Cancer - Cervical Maternal Aunt   . Kidney disease Maternal Grandmother   . Renal Disease Maternal Grandmother      Current Outpatient Medications:  .  acetaminophen (TYLENOL) 500 MG tablet, Take 1,000 mg by mouth daily as needed., Disp: , Rfl:  .  ferrous sulfate (FERROUSUL) 325 (65 FE) MG tablet, Take 1 tablet (325 mg total) by mouth 2 (two) times daily., Disp: 60 tablet, Rfl: 1 .  desogestrel-ethinyl estradiol (KARIVA) 0.15-0.02/0.01 MG (21/5) tablet, Take 1 tablet daily by mouth., Disp: 1 Package, Rfl: 11 .  fluconazole (DIFLUCAN) 150 MG tablet, Take 1 now and 1 in 3 days (Patient not taking: Reported on 05/04/2017), Disp: 2 tablet, Rfl: 1 .  medroxyPROGESTERone (DEPO-PROVERA) 150 MG/ML injection, Inject 1 mL (150 mg total) into the muscle every 3 (three) months. (Patient not taking: Reported on 05/04/2017), Disp: 1 mL, Rfl:  3 .  neomycin-polymyxin-hydrocortisone (CORTISPORIN) 3.5-10000-1 OTIC suspension, Place 3 drops into both ears 4 (four) times daily. (Patient not taking: Reported on 05/04/2017), Disp: 10 mL, Rfl: 0 .  ondansetron (ZOFRAN) 4 MG tablet, Take 1 tablet (4 mg total) by mouth every 8 (eight) hours as needed for nausea or vomiting. (Patient not taking: Reported on 05/04/2017), Disp: 20 tablet, Rfl: 0  Review of Systems  Review of Systems  Constitutional: Negative for fever, chills, weight loss, malaise/fatigue and diaphoresis.  HENT: Negative for hearing loss, ear pain, nosebleeds, congestion, sore throat, neck pain, tinnitus and ear discharge.   Eyes: Negative for blurred vision, double vision, photophobia, pain,  discharge and redness.  Respiratory: Negative for cough, hemoptysis, sputum production, shortness of breath, wheezing and stridor.   Cardiovascular: Negative for chest pain, palpitations, orthopnea, claudication, leg swelling and PND.  Gastrointestinal: negative for abdominal pain. Negative for heartburn, nausea, vomiting, diarrhea, constipation, blood in stool and melena.  Genitourinary: Negative for dysuria, urgency, frequency, hematuria and flank pain.  Musculoskeletal: Negative for myalgias, back pain, joint pain and falls.  Skin: Negative for itching and rash.  Neurological: Negative for dizziness, tingling, tremors, sensory change, speech change, focal weakness, seizures, loss of consciousness, weakness and headaches.  Endo/Heme/Allergies: Negative for environmental allergies and polydipsia. Does not bruise/bleed easily.  Psychiatric/Behavioral: Negative for depression, suicidal ideas, hallucinations, memory loss and substance abuse. The patient is not nervous/anxious and does not have insomnia.        Objective:  Blood pressure 126/80, pulse 74, height 5' (1.524 m), weight 234 lb (106.1 kg), last menstrual period 03/22/2017, not currently breastfeeding.   Physical Exam  Vitals reviewed. Constitutional: She is oriented to person, place, and time. She appears well-developed and well-nourished.  HENT:  Head: Normocephalic and atraumatic.        Right Ear: External ear normal.  Left Ear: External ear normal.  Nose: Nose normal.  Mouth/Throat: Oropharynx is clear and moist.  Eyes: Conjunctivae and EOM are normal. Pupils are equal, round, and reactive to light. Right eye exhibits no discharge. Left eye exhibits no discharge. No scleral icterus.  Neck: Normal range of motion. Neck supple. No tracheal deviation present. No thyromegaly present.  Cardiovascular: Normal rate, regular rhythm, normal heart sounds and intact distal pulses.  Exam reveals no gallop and no friction rub.   No murmur  heard. Respiratory: Effort normal and breath sounds normal. No respiratory distress. She has no wheezes. She has no rales. She exhibits no tenderness.  GI: Soft. Bowel sounds are normal. She exhibits no distension and no mass. There is no tenderness. There is no rebound and no guarding.  Genitourinary:  Breasts no masses skin changes or nipple changes bilaterally      Vulva is normal without lesions Vagina is pink moist without discharge Cervix normal in appearance and pap is done Uterus is normal size shape and contour Adnexa is negative with normal sized ovaries   Musculoskeletal: Normal range of motion. She exhibits no edema and no tenderness.  Neurological: She is alert and oriented to person, place, and time. She has normal reflexes. She displays normal reflexes. No cranial nerve deficit. She exhibits normal muscle tone. Coordination normal.  Skin: Skin is warm and dry. No rash noted. No erythema. No pallor.  Psychiatric: She has a normal mood and affect. Her behavior is normal. Judgment and thought content normal.       Medications Ordered at today's visit: Meds ordered this encounter  Medications  . DISCONTD: desogestrel-ethinyl  estradiol (KARIVA) 0.15-0.02/0.01 MG (21/5) tablet    Sig: Take 1 tablet daily by mouth.    Dispense:  1 Package    Refill:  11  . desogestrel-ethinyl estradiol (KARIVA) 0.15-0.02/0.01 MG (21/5) tablet    Sig: Take 1 tablet daily by mouth.    Dispense:  1 Package    Refill:  11    Other orders placed at today's visit: No orders of the defined types were placed in this encounter.     Assessment:    Healthy female exam.    Plan:    Contraception: OCP (estrogen/progesterone). Follow up in: 1 year.     Return in about 1 year (around 05/04/2018) for annual.

## 2017-05-05 ENCOUNTER — Encounter: Payer: Self-pay | Admitting: Adult Health

## 2017-05-06 ENCOUNTER — Encounter: Payer: Self-pay | Admitting: Obstetrics & Gynecology

## 2017-05-06 ENCOUNTER — Encounter: Payer: Self-pay | Admitting: Adult Health

## 2017-05-06 LAB — CYTOLOGY - PAP: Diagnosis: NEGATIVE

## 2017-05-07 ENCOUNTER — Telehealth: Payer: Self-pay | Admitting: Adult Health

## 2017-05-07 DIAGNOSIS — R519 Headache, unspecified: Secondary | ICD-10-CM

## 2017-05-07 DIAGNOSIS — R51 Headache: Principal | ICD-10-CM

## 2017-05-07 NOTE — Telephone Encounter (Signed)
Pt aware that referral has sent.  05-07-17  AS

## 2017-05-07 NOTE — Telephone Encounter (Signed)
Left message that referral sent to [place] [facility]

## 2017-05-19 NOTE — Progress Notes (Signed)
Blood pressure 122/78, pulse 60, weight 219 lb 6.4 oz (99.5 kg), not currently breastfeeding.  Mckenzie Cooley is here for me to recheck her blood pressure after being on Norvasc and Lasix and then I switched her to Dyazide from Lasix Her blood pressure today is acceptable and she is not having any symptoms She will continue to both the day but in the next couple weeks she is given weaning off the Norvasc continue the Dyazide and then hopefully stop the Dyazide She will do that 48 hours for next appointment

## 2017-06-30 ENCOUNTER — Encounter: Payer: Self-pay | Admitting: Obstetrics & Gynecology

## 2017-07-01 ENCOUNTER — Other Ambulatory Visit: Payer: Self-pay | Admitting: Adult Health

## 2017-07-01 MED ORDER — DESOGESTREL-ETHINYL ESTRADIOL 0.15-0.02/0.01 MG (21/5) PO TABS: 1.0000 | ORAL_TABLET | Freq: Every day | ORAL | 11 refills | Status: DC

## 2017-07-01 NOTE — Progress Notes (Signed)
Pt requests kariva, done on refill

## 2017-07-12 ENCOUNTER — Ambulatory Visit: Payer: BLUE CROSS/BLUE SHIELD | Admitting: Neurology

## 2017-07-13 ENCOUNTER — Encounter: Payer: Self-pay | Admitting: Neurology

## 2017-07-20 ENCOUNTER — Ambulatory Visit: Payer: BLUE CROSS/BLUE SHIELD | Admitting: Physician Assistant

## 2017-07-20 ENCOUNTER — Encounter: Payer: Self-pay | Admitting: Physician Assistant

## 2017-07-20 VITALS — BP 119/73 | HR 58 | Temp 97.2°F | Ht 60.0 in | Wt 235.8 lb

## 2017-07-20 DIAGNOSIS — Z8759 Personal history of other complications of pregnancy, childbirth and the puerperium: Secondary | ICD-10-CM

## 2017-07-20 DIAGNOSIS — Z0000 Encounter for general adult medical examination without abnormal findings: Secondary | ICD-10-CM

## 2017-07-20 DIAGNOSIS — E611 Iron deficiency: Secondary | ICD-10-CM | POA: Diagnosis not present

## 2017-07-20 DIAGNOSIS — J069 Acute upper respiratory infection, unspecified: Secondary | ICD-10-CM

## 2017-07-20 DIAGNOSIS — R002 Palpitations: Secondary | ICD-10-CM | POA: Diagnosis not present

## 2017-07-20 DIAGNOSIS — Z862 Personal history of diseases of the blood and blood-forming organs and certain disorders involving the immune mechanism: Secondary | ICD-10-CM | POA: Diagnosis not present

## 2017-07-20 MED ORDER — AZITHROMYCIN 250 MG PO TABS: ORAL_TABLET | ORAL | 0 refills | Status: DC

## 2017-07-20 NOTE — Patient Instructions (Signed)
Zinc (zicam) Elderberry gummies Honey/cinnamon

## 2017-07-21 LAB — CMP14+EGFR
A/G RATIO: 1.3 (ref 1.2–2.2)
ALBUMIN: 3.9 g/dL (ref 3.5–5.5)
ALK PHOS: 77 IU/L (ref 39–117)
ALT: 17 IU/L (ref 0–32)
AST: 20 IU/L (ref 0–40)
BILIRUBIN TOTAL: 0.3 mg/dL (ref 0.0–1.2)
BUN / CREAT RATIO: 11 (ref 9–23)
BUN: 8 mg/dL (ref 6–20)
CO2: 19 mmol/L — AB (ref 20–29)
CREATININE: 0.75 mg/dL (ref 0.57–1.00)
Calcium: 8.9 mg/dL (ref 8.7–10.2)
Chloride: 105 mmol/L (ref 96–106)
GFR calc Af Amer: 126 mL/min/{1.73_m2} (ref >59)
GFR calc non Af Amer: 110 mL/min/{1.73_m2} (ref >59)
GLOBULIN, TOTAL: 3.1 g/dL (ref 1.5–4.5)
Glucose: 93 mg/dL (ref 65–99)
POTASSIUM: 4.5 mmol/L (ref 3.5–5.2)
SODIUM: 141 mmol/L (ref 134–144)
Total Protein: 7 g/dL (ref 6.0–8.5)

## 2017-07-21 LAB — CBC WITH DIFFERENTIAL/PLATELET
BASOS: 0 %
Basophils Absolute: 0 10*3/uL (ref 0.0–0.2)
EOS (ABSOLUTE): 0.2 10*3/uL (ref 0.0–0.4)
EOS: 2 %
HEMATOCRIT: 40.8 % (ref 34.0–46.6)
HEMOGLOBIN: 13.5 g/dL (ref 11.1–15.9)
Immature Grans (Abs): 0 10*3/uL (ref 0.0–0.1)
Immature Granulocytes: 0 %
LYMPHS ABS: 2.1 10*3/uL (ref 0.7–3.1)
Lymphs: 26 %
MCH: 26.8 pg (ref 26.6–33.0)
MCHC: 33.1 g/dL (ref 31.5–35.7)
MCV: 81 fL (ref 79–97)
MONOCYTES: 8 %
Monocytes Absolute: 0.7 10*3/uL (ref 0.1–0.9)
Neutrophils Absolute: 5.3 10*3/uL (ref 1.4–7.0)
Neutrophils: 64 %
Platelets: 374 10*3/uL (ref 150–379)
RBC: 5.04 x10E6/uL (ref 3.77–5.28)
RDW: 14.9 % (ref 12.3–15.4)
WBC: 8.4 10*3/uL (ref 3.4–10.8)

## 2017-07-21 LAB — LIPID PANEL
CHOLESTEROL TOTAL: 155 mg/dL (ref 100–199)
Chol/HDL Ratio: 3.7 ratio (ref 0.0–4.4)
HDL: 42 mg/dL (ref >39)
LDL CALC: 96 mg/dL (ref 0–99)
Triglycerides: 86 mg/dL (ref 0–149)
VLDL Cholesterol Cal: 17 mg/dL (ref 5–40)

## 2017-07-21 LAB — THYROID PANEL WITH TSH
Free Thyroxine Index: 1.7 (ref 1.2–4.9)
T3 UPTAKE RATIO: 16 % — AB (ref 24–39)
T4 TOTAL: 10.5 ug/dL (ref 4.5–12.0)
TSH: 1.96 u[IU]/mL (ref 0.450–4.500)

## 2017-07-21 NOTE — Progress Notes (Signed)
BP 119/73   Pulse (!) 58   Temp (!) 97.2 F (36.2 C) (Oral)   Ht 5' (1.524 m)   Wt 235 lb 12.8 oz (107 kg)   BMI 46.05 kg/m    Subjective:    Patient ID: Mckenzie Cooley, female    DOB: May 08, 1991, 27 y.o.   MRN: [id number]  HPI: MAREA Cooley is a 27 y.o. female presenting on 07/20/2017 for New Patient (Initial Visit); Establish Care; and Cough  This patient comes in today as a new patient to be established with [place].  We do take care of her husband and her infant.  She had some issues with eclampsia during her pregnancy, blood loss that closed her to have anemia and required blood transfusion.  Since the postnatal time she has been doing much better.  She reports that her gynecologist has her labs are very good and stable.  However she continues with occasional palpitations.  She will have a discomfort in her chest.  It can be at rest or activity.  He can even happen at night when she is resting.  She denies any diaphoresis or radiation of the pain.  There is nothing she can do to make it go away. This patient has had many days of sore throat and postnasal drainage, headache at times and sinus pressure. There is copious drainage at times. Denies any fever at this time. There has been a history of sinus infections in the past.  There is cough at night. It has become more prevalent in recent days.   Relevant past medical, surgical, family and social history reviewed and updated as indicated. Allergies and medications reviewed and updated.  Past Medical History:  Diagnosis Date  . Acid reflux Dx 2011  . Allergy   . Anemia   . Asthma    As a Child  . Bloating 2011  . Blood transfusion without reported diagnosis   . Hypertension    PIH  . PCOS (polycystic ovarian syndrome) 09/27/2014  . Weight loss, abnormal     Past Surgical History:  Procedure Laterality Date  . CESAREAN SECTION N/A 01/02/2017   Procedure: CESAREAN SECTION;  Surgeon: Chancy Milroy, MD;  Location: [facility];  Service: Obstetrics;  Laterality: N/A;  . CHOLECYSTECTOMY  2011   Dr.Byerly  . ESOPHAGOGASTRODUODENOSCOPY N/A 10/26/2014   Procedure: ESOPHAGOGASTRODUODENOSCOPY (EGD);  Surgeon: Rogene Houston, MD;  [facility];  Service: Endoscopy;  Laterality: N/A;  . UPPER GASTROINTESTINAL ENDOSCOPY  12/05/2009  . UPPER GASTROINTESTINAL ENDOSCOPY  02/07/2009  . WISDOM TOOTH EXTRACTION    . WISDOM TOOTH EXTRACTION      Review of Systems  Constitutional: Positive for fatigue. Negative for activity change, appetite change, chills and fever.  HENT: Positive for congestion, postnasal drip and sore throat.   Eyes: Negative.   Respiratory: Positive for cough. Negative for shortness of breath and wheezing.   Cardiovascular: Positive for palpitations. Negative for chest pain and leg swelling.  Gastrointestinal: Negative.   Genitourinary: Negative.   Musculoskeletal: Negative.   Skin: Negative.   Neurological: Positive for headaches.    Allergies as of 07/20/2017      Reactions   Percocet [oxycodone-acetaminophen] Hives, Itching   Amoxicillin Rash   Has patient had a PCN reaction causing immediate rash, facial/tongue/throat swelling, SOB or lightheadedness with hypotension: Yes -mild rash Has patient had a PCN reaction causing severe rash involving mucus membranes or skin necrosis: No Has patient had a PCN reaction  that required hospitalization: No Has patient had a PCN reaction occurring within the last 10 years: Yes If all of the above answers are "NO", then may proceed with Cephalosporin use. Has tolerated ceftriaxone & cephalexin       Medication List        Accurate as of 07/20/17 11:59 PM. Always use your most recent med list.          acetaminophen 500 MG tablet Commonly known as:  TYLENOL Take 1,000 mg by mouth daily as needed.   azithromycin 250 MG tablet Commonly known as:  ZITHROMAX Take as directed   desogestrel-ethinyl estradiol 0.15-0.02/0.01 MG (21/5)  tablet Commonly known as:  KARIVA Take 1 tablet by mouth daily.   ferrous sulfate 325 (65 FE) MG tablet Commonly known as:  FERROUSUL Take 1 tablet (325 mg total) by mouth 2 (two) times daily.          Objective:    BP 119/73   Pulse (!) 58   Temp (!) 97.2 F (36.2 C) (Oral)   Ht 5' (1.524 m)   Wt 235 lb 12.8 oz (107 kg)   BMI 46.05 kg/m   Allergies  Allergen Reactions  . Percocet [Oxycodone-Acetaminophen] Hives and Itching  . Amoxicillin Rash    Has patient had a PCN reaction causing immediate rash, facial/tongue/throat swelling, SOB or lightheadedness with hypotension: Yes -mild rash Has patient had a PCN reaction causing severe rash involving mucus membranes or skin necrosis: No Has patient had a PCN reaction that required hospitalization: No Has patient had a PCN reaction occurring within the last 10 years: Yes If all of the above answers are "NO", then may proceed with Cephalosporin use. Has tolerated ceftriaxone & cephalexin     Physical Exam  Constitutional: She is oriented to person, place, and time. She appears well-developed and well-nourished.  HENT:  Head: Normocephalic and atraumatic.  Right Ear: A middle ear effusion is present.  Left Ear: A middle ear effusion is present.  Nose: Mucosal edema present. Right sinus exhibits no frontal sinus tenderness. Left sinus exhibits no frontal sinus tenderness.  Mouth/Throat: Posterior oropharyngeal erythema present. No oropharyngeal exudate or tonsillar abscesses.  Eyes: Conjunctivae and EOM are normal. Pupils are equal, round, and reactive to light.  Neck: Normal range of motion.  Cardiovascular: Normal rate, regular rhythm, normal heart sounds and intact distal pulses.  Pulmonary/Chest: Effort normal and breath sounds normal.  Abdominal: Soft. Bowel sounds are normal.  Neurological: She is alert and oriented to person, place, and time. She has normal reflexes.  Skin: Skin is warm and dry. No rash noted.    Psychiatric: She has a normal mood and affect. Her behavior is normal. Judgment and thought content normal.  Nursing note and vitals reviewed.   Results for orders placed or performed in visit on 07/20/17  CBC with Differential/Platelet  Result Value Ref Range   WBC 8.4 3.4 - 10.8 x10E3/uL   RBC 5.04 3.77 - 5.28 x10E6/uL   Hemoglobin 13.5 11.1 - 15.9 g/dL   Hematocrit 40.8 34.0 - 46.6 %   MCV 81 79 - 97 fL   MCH 26.8 26.6 - 33.0 pg   MCHC 33.1 31.5 - 35.7 g/dL   RDW 14.9 12.3 - 15.4 %   Platelets 374 150 - 379 x10E3/uL   Neutrophils 64 Not Estab. %   Lymphs 26 Not Estab. %   Monocytes 8 Not Estab. %   Eos 2 Not Estab. %   Basos 0 Not  Estab. %   Neutrophils Absolute 5.3 1.4 - 7.0 x10E3/uL   Lymphocytes Absolute 2.1 0.7 - 3.1 x10E3/uL   Monocytes Absolute 0.7 0.1 - 0.9 x10E3/uL   EOS (ABSOLUTE) 0.2 0.0 - 0.4 x10E3/uL   Basophils Absolute 0.0 0.0 - 0.2 x10E3/uL   Immature Granulocytes 0 Not Estab. %   Immature Grans (Abs) 0.0 0.0 - 0.1 x10E3/uL  CMP14+EGFR  Result Value Ref Range   Glucose 93 65 - 99 mg/dL   BUN 8 6 - 20 mg/dL   Creatinine, Ser 0.75 0.57 - 1.00 mg/dL   GFR calc non Af Amer 110 >59 mL/min/1.73   GFR calc Af Amer 126 >59 mL/min/1.73   BUN/Creatinine Ratio 11 9 - 23   Sodium 141 134 - 144 mmol/L   Potassium 4.5 3.5 - 5.2 mmol/L   Chloride 105 96 - 106 mmol/L   CO2 19 (L) 20 - 29 mmol/L   Calcium 8.9 8.7 - 10.2 mg/dL   Total Protein 7.0 6.0 - 8.5 g/dL   Albumin 3.9 3.5 - 5.5 g/dL   Globulin, Total 3.1 1.5 - 4.5 g/dL   Albumin/Globulin Ratio 1.3 1.2 - 2.2   Bilirubin Total 0.3 0.0 - 1.2 mg/dL   Alkaline Phosphatase 77 39 - 117 IU/L   AST 20 0 - 40 IU/L   ALT 17 0 - 32 IU/L  Lipid panel  Result Value Ref Range   Cholesterol, Total 155 100 - 199 mg/dL   Triglycerides 86 0 - 149 mg/dL   HDL 42 >39 mg/dL   VLDL Cholesterol Cal 17 5 - 40 mg/dL   LDL Calculated 96 0 - 99 mg/dL   Chol/HDL Ratio 3.7 0.0 - 4.4 ratio  Thyroid Panel With TSH  Result Value Ref  Range   TSH 1.960 0.450 - 4.500 uIU/mL   T4, Total 10.5 4.5 - 12.0 ug/dL   T3 Uptake Ratio 16 (L) 24 - 39 %   Free Thyroxine Index 1.7 1.2 - 4.9      Assessment & Plan:   1. Iron deficiency - CBC with Differential/Platelet  2. Viral upper respiratory tract infection  3. Well adult exam - CBC with Differential/Platelet - CMP14+EGFR - Lipid panel - Thyroid Panel With TSH  4. Palpitations - Ambulatory referral to Cardiology  5. History of severe pre-eclampsia  6. History of anemia    Current Outpatient Medications:  .  acetaminophen (TYLENOL) 500 MG tablet, Take 1,000 mg by mouth daily as needed., Disp: , Rfl:  .  azithromycin (ZITHROMAX) 250 MG tablet, Take as directed, Disp: 6 tablet, Rfl: 0 .  desogestrel-ethinyl estradiol (KARIVA) 0.15-0.02/0.01 MG (21/5) tablet, Take 1 tablet by mouth daily., Disp: 1 Package, Rfl: 11 .  ferrous sulfate (FERROUSUL) 325 (65 FE) MG tablet, Take 1 tablet (325 mg total) by mouth 2 (two) times daily., Disp: 60 tablet, Rfl: 1 Continue all other maintenance medications as listed above.  Follow up plan: No Follow-up on file.  Educational handout given for [place] PA-C [place] [address]  [place],  [phone] [phone]   07/21/2017, 9:09 AM

## 2017-08-04 ENCOUNTER — Encounter: Payer: Self-pay | Admitting: Cardiology

## 2017-08-04 NOTE — Progress Notes (Signed)
Cardiology Office Note  Date: 08/05/2017   ID: Mckenzie Cooley, DOB September 09, 1990, MRN [id number]  PCP: Terald Sleeper, PA-C  Consulting Cardiologist: Rozann Lesches, MD   Chief Complaint  Patient presents with  . Palpitations    History of Present Illness: Mckenzie Cooley is a 27 y.o. female referred for cardiology consultation by [name] for the evaluation of chest pain.  She is here today with her husband.  She states that over the last month or so she has been experiencing episodes of sharp stabbing chest discomfort, sometimes radiating to the back and shoulders, can occur when she is busy at work, but also has happened at rest when she is at home.  She has felt the symptoms after the birth of her daughter back last summer.  She had preeclampsia at that time.  No known history of cardiomyopathy.  No history of thromboembolic disease.  Feels short of breath and anxious when these episodes happen.  She has not had any associated palpitations or syncope.  I personally reviewed her ECG today which shows sinus rhythm with rightward axis.  I went over her medications which are outlined below.  Past Medical History:  Diagnosis Date  . Acid reflux   . Anemia   . Bloating   . Childhood asthma   . History of blood transfusion   . PCOS (polycystic ovarian syndrome) 09/27/2014  . Preeclampsia   . Seasonal allergies     Past Surgical History:  Procedure Laterality Date  . CESAREAN SECTION N/A 01/02/2017   Procedure: CESAREAN SECTION;  Surgeon: Chancy Milroy, MD;  Location: [facility];  Service: Obstetrics;  Laterality: N/A;  . CHOLECYSTECTOMY  2011   Dr.Byerly  . ESOPHAGOGASTRODUODENOSCOPY N/A 10/26/2014   Procedure: ESOPHAGOGASTRODUODENOSCOPY (EGD);  Surgeon: Rogene Houston, MD;  [facility];  Service: Endoscopy;  Laterality: N/A;  . UPPER GASTROINTESTINAL ENDOSCOPY  12/05/2009  . UPPER GASTROINTESTINAL ENDOSCOPY  02/07/2009  . WISDOM TOOTH EXTRACTION    .  WISDOM TOOTH EXTRACTION      Current Outpatient Medications  Medication Sig Dispense Refill  . acetaminophen (TYLENOL) 500 MG tablet Take 1,000 mg by mouth daily as needed.    . desogestrel-ethinyl estradiol (KARIVA) 0.15-0.02/0.01 MG (21/5) tablet Take 1 tablet by mouth daily. 1 Package 11  . ferrous sulfate (FERROUSUL) 325 (65 FE) MG tablet Take 1 tablet (325 mg total) by mouth 2 (two) times daily. 60 tablet 1   No current facility-administered medications for this visit.    Allergies:  Percocet [oxycodone-acetaminophen] and Amoxicillin   Social History: The patient  reports that she quit smoking about 15 months ago. Her smoking use included cigarettes. She has a 6.00 pack-year smoking history. she has never used smokeless tobacco. She reports that she does not drink alcohol or use drugs.   Family History: The patient's family history includes Cancer - Cervical in her maternal aunt; Heart disease in her father; Hypertension in her mother; Kidney disease in her maternal grandmother; Renal Disease in her maternal grandmother.   ROS:  Please see the history of present illness. Otherwise, complete review of systems is positive for pain associated with moving her left arm up over the level of her shoulder.  All other systems are reviewed and negative.   Physical Exam: VS:  BP 130/82 (BP Location: Right Arm)   Ht 5' (1.524 m)   Wt 235 lb (106.6 kg)   SpO2 98%   BMI 45.90 kg/m ,  BMI Body mass index is 45.9 kg/m.  Wt Readings from Last 3 Encounters:  08/05/17 235 lb (106.6 kg)  07/20/17 235 lb 12.8 oz (107 kg)  05/04/17 234 lb (106.1 kg)    General: Morbidly obese young woman appears comfortable at rest. HEENT: Conjunctiva and lids normal, oropharynx clear. Neck: Supple, no elevated JVP or carotid bruits, no thyromegaly. Lungs: Clear to auscultation, nonlabored breathing at rest. Cardiac: Regular rate and rhythm, no S3, soft systolic murmur, no pericardial rub. Abdomen: Soft,  nontender, bowel sounds present. Extremities: No pitting edema, distal pulses 2+. Skin: Warm and dry. Musculoskeletal: No kyphosis. Neuropsychiatric: Alert and oriented x3, affect grossly appropriate.  ECG: There is no old tracing available for comparison today.  Recent Labwork: 07/20/2017: ALT 17; AST 20; BUN 8; Creatinine, Ser 0.75; Hemoglobin 13.5; Platelets 374; Potassium 4.5; Sodium 141; TSH 1.960     Component Value Date/Time   CHOL 155 07/20/2017 1120   TRIG 86 07/20/2017 1120   HDL 42 07/20/2017 1120   CHOLHDL 3.7 07/20/2017 1120   LDLCALC 96 07/20/2017 1120   Assessment and Plan:  1.  Atypical chest pain associated with shortness of breath as described above.  ECG is nonspecific with rightward axis.  She reports a history of asthma, but states that this has been well controlled without flares since she stopped smoking.  She does not report any fevers or chills, no cough or hemoptysis.  Plan is to obtain a CT angiogram of the chest mainly to exclude pulmonary embolus.  We will also obtain an echocardiogram to exclude cardiomyopathy.  2.  Heart murmur, patient states that she has had this since childhood.  This will be further assessed by echocardiogram as well.  3.  Morbid obesity.  Question whether some of her symptoms could be related to thoracic or cervical disc disease and nerve compression based on description.  4.  History of preeclampsia.  No standing history of hypertension.  Current medicines were reviewed with the patient today.   Orders Placed This Encounter  Procedures  . CT ANGIO CHEST PE W OR WO CONTRAST  . Basic metabolic panel  . EKG 12-Lead  . ECHOCARDIOGRAM COMPLETE    Disposition: Call with test results.  Signed, Satira Sark, MD, [facility] 08/05/2017 1:56 PM    [facility], [place], [place] [address] Phone: [phone]; Fax: [phone]

## 2017-08-05 ENCOUNTER — Encounter: Payer: Self-pay | Admitting: Cardiology

## 2017-08-05 ENCOUNTER — Other Ambulatory Visit: Payer: Self-pay | Admitting: *Deleted

## 2017-08-05 ENCOUNTER — Telehealth: Payer: Self-pay | Admitting: Cardiology

## 2017-08-05 ENCOUNTER — Ambulatory Visit: Payer: BLUE CROSS/BLUE SHIELD | Admitting: Cardiology

## 2017-08-05 ENCOUNTER — Other Ambulatory Visit: Payer: BLUE CROSS/BLUE SHIELD

## 2017-08-05 ENCOUNTER — Telehealth (HOSPITAL_COMMUNITY): Payer: Self-pay | Admitting: Cardiology

## 2017-08-05 ENCOUNTER — Ambulatory Visit (HOSPITAL_COMMUNITY): Payer: BLUE CROSS/BLUE SHIELD

## 2017-08-05 VITALS — BP 130/82 | Ht 60.0 in | Wt 235.0 lb

## 2017-08-05 DIAGNOSIS — R002 Palpitations: Secondary | ICD-10-CM | POA: Diagnosis not present

## 2017-08-05 DIAGNOSIS — E611 Iron deficiency: Secondary | ICD-10-CM

## 2017-08-05 DIAGNOSIS — R011 Cardiac murmur, unspecified: Secondary | ICD-10-CM | POA: Diagnosis not present

## 2017-08-05 DIAGNOSIS — R0789 Other chest pain: Secondary | ICD-10-CM | POA: Diagnosis not present

## 2017-08-05 NOTE — Progress Notes (Signed)
BMP

## 2017-08-05 NOTE — Patient Instructions (Signed)
Medication Instructions:  Your physician recommends that you continue on your current medications as directed. Please refer to the Current Medication list given to you today.  Labwork: BMET Orders given today   Testing/Procedures: Your physician has requested that you have an echocardiogram. Echocardiography is a painless test that uses sound waves to create images of your heart. It provides your doctor with information about the size and shape of your heart and how well your heart's chambers and valves are working. This procedure takes approximately one hour. There are no restrictions for this procedure.  Non-Cardiac CT Angiography (CTA), is a special type of CT scan that uses a computer to produce multi-dimensional views of major blood vessels throughout the body. In CT angiography, a contrast material is injected through an IV to help visualize the blood vessels  Follow-Up: Your physician recommends that you schedule a follow-up appointment PENDING TEST RESULTS  Any Other Special Instructions Will Be Listed Below (If Applicable).  If you need a refill on your cardiac medications before your next appointment, please call your pharmacy.

## 2017-08-05 NOTE — Telephone Encounter (Signed)
Percert requested for Echo scheduled for 08/11/2017 at [facility]

## 2017-08-05 NOTE — Telephone Encounter (Signed)
Pre-cert Verification for the following procedure   CT ANGIO CHEST PE W/CM &/OR WO scheduled for 08-06-17 at [facility]

## 2017-08-06 ENCOUNTER — Ambulatory Visit (HOSPITAL_COMMUNITY): Payer: BLUE CROSS/BLUE SHIELD

## 2017-08-06 LAB — BASIC METABOLIC PANEL
BUN/Creatinine Ratio: 10 (ref 9–23)
BUN: 7 mg/dL (ref 6–20)
CALCIUM: 9.2 mg/dL (ref 8.7–10.2)
CO2: 21 mmol/L (ref 20–29)
CREATININE: 0.7 mg/dL (ref 0.57–1.00)
Chloride: 106 mmol/L (ref 96–106)
GFR calc Af Amer: 137 mL/min/{1.73_m2} (ref >59)
GFR calc non Af Amer: 119 mL/min/{1.73_m2} (ref >59)
GLUCOSE: 103 mg/dL — AB (ref 65–99)
Potassium: 4.5 mmol/L (ref 3.5–5.2)
Sodium: 142 mmol/L (ref 134–144)

## 2017-08-11 ENCOUNTER — Other Ambulatory Visit: Payer: Self-pay

## 2017-08-11 ENCOUNTER — Ambulatory Visit (HOSPITAL_COMMUNITY)
Admission: RE | Admit: 2017-08-11 | Discharge: 2017-08-11 | Disposition: A | Discharge status: Home / Self Care | Payer: BLUE CROSS/BLUE SHIELD | Source: Ambulatory Visit | Attending: Cardiology | Admitting: Cardiology

## 2017-08-11 ENCOUNTER — Ambulatory Visit (INDEPENDENT_AMBULATORY_CARE_PROVIDER_SITE_OTHER): Payer: BLUE CROSS/BLUE SHIELD

## 2017-08-11 DIAGNOSIS — R011 Cardiac murmur, unspecified: Secondary | ICD-10-CM | POA: Diagnosis not present

## 2017-08-11 DIAGNOSIS — R079 Chest pain, unspecified: Secondary | ICD-10-CM | POA: Diagnosis not present

## 2017-08-11 DIAGNOSIS — R0789 Other chest pain: Secondary | ICD-10-CM

## 2017-08-11 LAB — ECHOCARDIOGRAM COMPLETE
CHL CUP RV SYS PRESS: 12 mmHg
E/e' ratio: 8.52
EWDT: 222 ms
FS: 46 % — AB (ref 28–44)
IV/PV OW: 0.66
LA ID, A-P, ES: 31 mm
LA diam end sys: 31 mm
LA diam index: 1.41 cm/m2
LA vol: 21.3 mL
LAVOLA4C: 21.1 mL
LAVOLIN: 9.7 mL/m2
LDCA: 2.54 cm2
LV E/e' medial: 8.52
LV E/e'average: 8.52
LV PW d: 8.8 mm — AB (ref 0.6–1.1)
LV TDI E'LATERAL: 13.5
LV e' LATERAL: 13.5 cm/s
LV sys vol: 17 mL (ref 14–42)
LVDIAVOL: 57 mL (ref 46–106)
LVDIAVOLIN: 26 mL/m2
LVOT VTI: 19.2 cm
LVOT diameter: 18 mm
LVOTPV: 94.8 cm/s
LVOTSV: 49 mL
LVSYSVOLIN: 8 mL/m2
MV Dec: 222
MV Peak grad: 5 mmHg
MV pk E vel: 115 m/s
MVPKAVEL: 91.2 m/s
RV LATERAL S' VELOCITY: 14.1 cm/s
RV TAPSE: 24.9 mm
Reg peak vel: 149 cm/s
Simpson's disk: 70
Stroke v: 40 ml
TDI e' medial: 9.38
TR max vel: 149 cm/s

## 2017-08-11 MED ORDER — IOPAMIDOL (ISOVUE-370) INJECTION 76%
100.0000 mL | Freq: Once | INTRAVENOUS | Status: AC | PRN
  Administered 2017-08-11: 100 mL via INTRAVENOUS

## 2017-08-12 ENCOUNTER — Telehealth: Payer: Self-pay

## 2017-08-12 NOTE — Telephone Encounter (Signed)
Patient notified. Routed to PCP 

## 2017-08-12 NOTE — Telephone Encounter (Signed)
-----   Message from Acquanetta Chain, LPN sent at 1/44/8185  7:27 AM EST -----   ----- Message ----- From: Satira Sark, MD Sent: 08/11/2017   7:17 PM To: Merlene Laughter, LPN, Terald Sleeper, PA-C  Results reviewed.  Please let her know that the echocardiogram was normal.  No further cardiac workup planned at this time. A copy of this test should be forwarded to Terald Sleeper, PA-C.

## 2017-08-12 NOTE — Telephone Encounter (Signed)
-----   Message from Merlene Laughter, LPN sent at 12/06/735  4:23 PM EST -----   ----- Message ----- From: Satira Sark, MD Sent: 08/11/2017  12:38 PM To: Merlene Laughter, LPN, Terald Sleeper, PA-C  Results reviewed.  Please let her know that the chest CT was normal. A copy of this test should be forwarded to Terald Sleeper, PA-C.

## 2017-08-16 ENCOUNTER — Encounter: Payer: Self-pay | Admitting: Physician Assistant

## 2017-08-23 ENCOUNTER — Encounter: Payer: Self-pay | Admitting: Physician Assistant

## 2017-10-19 ENCOUNTER — Encounter: Payer: Self-pay | Admitting: Obstetrics & Gynecology

## 2017-10-20 ENCOUNTER — Other Ambulatory Visit: Payer: Self-pay | Admitting: Obstetrics & Gynecology

## 2017-10-20 MED ORDER — PANTOPRAZOLE SODIUM 40 MG PO TBEC: 40.0000 mg | DELAYED_RELEASE_TABLET | Freq: Every day | ORAL | 11 refills | Status: DC

## 2017-11-03 ENCOUNTER — Encounter: Payer: Self-pay | Admitting: Physician Assistant

## 2017-11-05 ENCOUNTER — Encounter: Payer: Self-pay | Admitting: Physician Assistant

## 2017-11-05 ENCOUNTER — Ambulatory Visit: Payer: BLUE CROSS/BLUE SHIELD | Admitting: Physician Assistant

## 2017-11-05 VITALS — BP 132/80 | HR 95 | Temp 97.4°F | Ht 60.0 in | Wt 232.4 lb

## 2017-11-05 DIAGNOSIS — G43809 Other migraine, not intractable, without status migrainosus: Secondary | ICD-10-CM

## 2017-11-05 MED ORDER — TOPIRAMATE 25 MG PO TABS: 25.0000 mg | ORAL_TABLET | Freq: Two times a day (BID) | ORAL | 11 refills | Status: DC

## 2017-11-05 MED ORDER — SUMATRIPTAN SUCCINATE 50 MG PO TABS: 25.0000 mg | ORAL_TABLET | ORAL | 2 refills | Status: DC | PRN

## 2017-11-07 NOTE — Progress Notes (Signed)
BP 132/80   Pulse 95   Temp (!) 97.4 F (36.3 C) (Oral)   Ht 5' (1.524 m)   Wt 232 lb 6.4 oz (105.4 kg)   BMI 45.39 kg/m    Subjective:    Patient ID: Mckenzie Cooley, female    DOB: [birth year], 27 y.o.   MRN: [id number]  HPI: Mckenzie Cooley is a 27 y.o. female presenting on 11/05/2017 for Migraine (x 2 months- weekly once a week all day)  This patient comes in for recheck on her migraines.  Is been going on for about 2 months.  She also gets some back and lower back pain.  She states that she is having at least one weekly.  It would last all day.  She states that she is just using cold medicines mostly.  She has never been on prevention medicine or any type of triptan in the past.  Past Medical History:  Diagnosis Date  . Acid reflux   . Anemia   . Bloating   . Childhood asthma   . History of blood transfusion   . PCOS (polycystic ovarian syndrome) 09/27/2014  . Preeclampsia   . Seasonal allergies    Relevant past medical, surgical, family and social history reviewed and updated as indicated. Interim medical history since our last visit reviewed. Allergies and medications reviewed and updated. DATA REVIEWED: CHART IN EPIC  Family History reviewed for pertinent findings.  Review of Systems  Constitutional: Negative.   HENT: Negative.   Eyes: Negative.   Respiratory: Negative.   Gastrointestinal: Negative.   Genitourinary: Negative.   Neurological: Positive for headaches.    Allergies as of 11/05/2017      Reactions   Percocet [oxycodone-acetaminophen] Hives, Itching   Amoxicillin Rash   Has patient had a PCN reaction causing immediate rash, facial/tongue/throat swelling, SOB or lightheadedness with hypotension: Yes -mild rash Has patient had a PCN reaction causing severe rash involving mucus membranes or skin necrosis: No Has patient had a PCN reaction that required hospitalization: No Has patient had a PCN reaction occurring within the last 10 years: Yes If all  of the above answers are "NO", then may proceed with Cephalosporin use. Has tolerated ceftriaxone & cephalexin       Medication List        Accurate as of 11/05/17 11:59 PM. Always use your most recent med list.          acetaminophen 500 MG tablet Commonly known as:  TYLENOL Take 1,000 mg by mouth daily as needed.   desogestrel-ethinyl estradiol 0.15-0.02/0.01 MG (21/5) tablet Commonly known as:  KARIVA Take 1 tablet by mouth daily.   pantoprazole 40 MG tablet Commonly known as:  PROTONIX Take 1 tablet (40 mg total) by mouth daily.   SUMAtriptan 50 MG tablet Commonly known as:  IMITREX Take 0.5 tablets (25 mg total) by mouth every 2 (two) hours as needed for migraine. May repeat in 2 hours if headache persists or recurs.   topiramate 25 MG tablet Commonly known as:  TOPAMAX Take 1-2 tablets (25-50 mg total) by mouth 2 (two) times daily.          Objective:    BP 132/80   Pulse 95   Temp (!) 97.4 F (36.3 C) (Oral)   Ht 5' (1.524 m)   Wt 232 lb 6.4 oz (105.4 kg)   BMI 45.39 kg/m   Allergies  Allergen Reactions  . Percocet [Oxycodone-Acetaminophen] Hives and Itching  .  Amoxicillin Rash    Has patient had a PCN reaction causing immediate rash, facial/tongue/throat swelling, SOB or lightheadedness with hypotension: Yes -mild rash Has patient had a PCN reaction causing severe rash involving mucus membranes or skin necrosis: No Has patient had a PCN reaction that required hospitalization: No Has patient had a PCN reaction occurring within the last 10 years: Yes If all of the above answers are "NO", then may proceed with Cephalosporin use. Has tolerated ceftriaxone & cephalexin     Wt Readings from Last 3 Encounters:  11/05/17 232 lb 6.4 oz (105.4 kg)  08/05/17 235 lb (106.6 kg)  07/20/17 235 lb 12.8 oz (107 kg)    Physical Exam  Constitutional: She is oriented to person, place, and time. She appears well-developed and well-nourished.  HENT:  Head:  Normocephalic and atraumatic.  Right Ear: Tympanic membrane, external ear and ear canal normal.  Left Ear: Tympanic membrane, external ear and ear canal normal.  Nose: Nose normal. No rhinorrhea.  Mouth/Throat: Oropharynx is clear and moist and mucous membranes are normal. No oropharyngeal exudate or posterior oropharyngeal erythema.  Eyes: Pupils are equal, round, and reactive to light. Conjunctivae and EOM are normal.  Neck: Normal range of motion. Neck supple.  Cardiovascular: Normal rate, regular rhythm, normal heart sounds and intact distal pulses.  Pulmonary/Chest: Effort normal and breath sounds normal.  Abdominal: Soft. Bowel sounds are normal.  Neurological: She is alert and oriented to person, place, and time. She has normal reflexes.  Skin: Skin is warm and dry. No rash noted.  Psychiatric: She has a normal mood and affect. Her behavior is normal. Judgment and thought content normal.        Assessment & Plan:   1. Other migraine without status migrainosus, not intractable - topiramate (TOPAMAX) 25 MG tablet; Take 1-2 tablets (25-50 mg total) by mouth 2 (two) times daily.  Dispense: 60 tablet; Refill: 11 - SUMAtriptan (IMITREX) 50 MG tablet; Take 0.5 tablets (25 mg total) by mouth every 2 (two) hours as needed for migraine. May repeat in 2 hours if headache persists or recurs.  Dispense: 10 tablet; Refill: 2   Continue all other maintenance medications as listed above.  Follow up plan: Return in about 1 month (around 12/03/2017) for recheck.  Educational handout given for [place] PA-C [place] [address]  [place], [place] [address] [phone]   11/07/2017, 8:35 PM

## 2017-11-16 ENCOUNTER — Encounter: Payer: Self-pay | Admitting: Physician Assistant

## 2017-11-30 ENCOUNTER — Encounter: Payer: Self-pay | Admitting: Obstetrics & Gynecology

## 2017-12-01 ENCOUNTER — Other Ambulatory Visit: Payer: Self-pay | Admitting: Obstetrics & Gynecology

## 2017-12-01 MED ORDER — SULFAMETHOXAZOLE-TRIMETHOPRIM 800-160 MG PO TABS: 1.0000 | ORAL_TABLET | Freq: Two times a day (BID) | ORAL | 0 refills | Status: DC

## 2017-12-08 ENCOUNTER — Encounter: Payer: Self-pay | Admitting: Physician Assistant

## 2017-12-08 ENCOUNTER — Ambulatory Visit (INDEPENDENT_AMBULATORY_CARE_PROVIDER_SITE_OTHER): Payer: BLUE CROSS/BLUE SHIELD | Admitting: Physician Assistant

## 2017-12-08 DIAGNOSIS — G43809 Other migraine, not intractable, without status migrainosus: Secondary | ICD-10-CM

## 2017-12-08 MED ORDER — TOPIRAMATE 50 MG PO TABS: 50.0000 mg | ORAL_TABLET | Freq: Two times a day (BID) | ORAL | 1 refills | Status: DC

## 2017-12-08 MED ORDER — SUMATRIPTAN SUCCINATE 100 MG PO TABS: 100.0000 mg | ORAL_TABLET | ORAL | 5 refills | Status: DC | PRN

## 2017-12-08 MED ORDER — KETOROLAC TROMETHAMINE 60 MG/2ML IM SOLN
60.0000 mg | Freq: Once | INTRAMUSCULAR | Status: AC
  Administered 2017-12-08: 60 mg via INTRAMUSCULAR

## 2017-12-09 DIAGNOSIS — G43909 Migraine, unspecified, not intractable, without status migrainosus: Secondary | ICD-10-CM | POA: Insufficient documentation

## 2017-12-09 NOTE — Progress Notes (Signed)
BP (!) 133/95   Pulse (!) 102   Temp 97.9 F (36.6 C) (Oral)   Ht 5' (1.524 m)   Wt 232 lb (105.2 kg)   BMI 45.31 kg/m    Subjective:    Patient ID: Mckenzie Cooley, female    DOB: 07-07-1990, 27 y.o.   MRN: [id number]  HPI: Mckenzie Cooley is a 27 y.o. female presenting on 12/08/2017 for Migraine (1 month follow up )  This patient comes in for a one-month recheck on her migraines.  She said that she took 25 mg of Imitrex at her headache and then repeated with another 25 mg in a couple hours it did not help.  She also started 25 mg for the Topamax but because it did not help very much she did not progress it.  She says she was tolerating it okay.  We have had a long discussion about trying to build up the Topamax to 100 mg over the next month.  I have also written her for Imitrex 100 mg to be taken at the start of the headache.  We will recheck everything in 1 month.  Past Medical History:  Diagnosis Date  . Acid reflux   . Anemia   . Bloating   . Childhood asthma   . History of blood transfusion   . PCOS (polycystic ovarian syndrome) 09/27/2014  . Preeclampsia   . Seasonal allergies    Relevant past medical, surgical, family and social history reviewed and updated as indicated. Interim medical history since our last visit reviewed. Allergies and medications reviewed and updated. DATA REVIEWED: CHART IN EPIC  Family History reviewed for pertinent findings.  Review of Systems  Constitutional: Negative.   HENT: Negative.   Eyes: Negative.   Respiratory: Negative.   Gastrointestinal: Negative.   Genitourinary: Negative.     Allergies as of 12/08/2017      Reactions   Percocet [oxycodone-acetaminophen] Hives, Itching   Amoxicillin Rash   Has patient had a PCN reaction causing immediate rash, facial/tongue/throat swelling, SOB or lightheadedness with hypotension: Yes -mild rash Has patient had a PCN reaction causing severe rash involving mucus membranes or skin necrosis:  No Has patient had a PCN reaction that required hospitalization: No Has patient had a PCN reaction occurring within the last 10 years: Yes If all of the above answers are "NO", then may proceed with Cephalosporin use. Has tolerated ceftriaxone & cephalexin       Medication List        Accurate as of 12/08/17 11:59 PM. Always use your most recent med list.          acetaminophen 500 MG tablet Commonly known as:  TYLENOL Take 1,000 mg by mouth daily as needed.   desogestrel-ethinyl estradiol 0.15-0.02/0.01 MG (21/5) tablet Commonly known as:  KARIVA Take 1 tablet by mouth daily.   pantoprazole 40 MG tablet Commonly known as:  PROTONIX Take 1 tablet (40 mg total) by mouth daily.   sulfamethoxazole-trimethoprim 800-160 MG tablet Commonly known as:  BACTRIM DS,SEPTRA DS Take 1 tablet by mouth 2 (two) times daily.   SUMAtriptan 100 MG tablet Commonly known as:  IMITREX Take 1 tablet (100 mg total) by mouth every 2 (two) hours as needed for migraine. May repeat in 2 hours if headache persists or recurs.   topiramate 50 MG tablet Commonly known as:  TOPAMAX Take 1-2 tablets (50-100 mg total) by mouth 2 (two) times daily.  Objective:    BP (!) 133/95   Pulse (!) 102   Temp 97.9 F (36.6 C) (Oral)   Ht 5' (1.524 m)   Wt 232 lb (105.2 kg)   BMI 45.31 kg/m   Allergies  Allergen Reactions  . Percocet [Oxycodone-Acetaminophen] Hives and Itching  . Amoxicillin Rash    Has patient had a PCN reaction causing immediate rash, facial/tongue/throat swelling, SOB or lightheadedness with hypotension: Yes -mild rash Has patient had a PCN reaction causing severe rash involving mucus membranes or skin necrosis: No Has patient had a PCN reaction that required hospitalization: No Has patient had a PCN reaction occurring within the last 10 years: Yes If all of the above answers are "NO", then may proceed with Cephalosporin use. Has tolerated ceftriaxone & cephalexin     Wt  Readings from Last 3 Encounters:  12/08/17 232 lb (105.2 kg)  11/05/17 232 lb 6.4 oz (105.4 kg)  08/05/17 235 lb (106.6 kg)    Physical Exam  Constitutional: She is oriented to person, place, and time. She appears well-developed and well-nourished.  HENT:  Head: Normocephalic and atraumatic.  Eyes: Pupils are equal, round, and reactive to light. Conjunctivae and EOM are normal.  Cardiovascular: Normal rate, regular rhythm, normal heart sounds and intact distal pulses.  Pulmonary/Chest: Effort normal and breath sounds normal.  Abdominal: Soft. Bowel sounds are normal.  Neurological: She is alert and oriented to person, place, and time. She has normal reflexes.  Skin: Skin is warm and dry. No rash noted.  Psychiatric: She has a normal mood and affect. Her behavior is normal. Judgment and thought content normal.        Assessment & Plan:   1. Other migraine without status migrainosus, not intractable - ketorolac (TORADOL) injection 60 mg - SUMAtriptan (IMITREX) 100 MG tablet; Take 1 tablet (100 mg total) by mouth every 2 (two) hours as needed for migraine. May repeat in 2 hours if headache persists or recurs.  Dispense: 10 tablet; Refill: 5 - topiramate (TOPAMAX) 50 MG tablet; Take 1-2 tablets (50-100 mg total) by mouth 2 (two) times daily.  Dispense: 120 tablet; Refill: 1   Continue all other maintenance medications as listed above.  Follow up plan: Return in about 1 month (around 01/05/2018).  Educational handout given for [place] PA-C [place] [address]  [place], [place] [address] [phone]   12/09/2017, 10:17 AM

## 2017-12-20 ENCOUNTER — Encounter: Payer: Self-pay | Admitting: Obstetrics & Gynecology

## 2017-12-28 ENCOUNTER — Encounter: Payer: Self-pay | Admitting: Physician Assistant

## 2017-12-31 ENCOUNTER — Ambulatory Visit: Payer: BLUE CROSS/BLUE SHIELD | Admitting: Obstetrics & Gynecology

## 2018-01-07 ENCOUNTER — Ambulatory Visit: Payer: BLUE CROSS/BLUE SHIELD | Admitting: Obstetrics & Gynecology

## 2018-01-07 ENCOUNTER — Encounter: Payer: Self-pay | Admitting: Obstetrics & Gynecology

## 2018-01-07 ENCOUNTER — Other Ambulatory Visit: Payer: Self-pay

## 2018-01-07 VITALS — BP 119/73 | HR 60 | Ht 60.0 in | Wt 229.0 lb

## 2018-01-07 DIAGNOSIS — B373 Candidiasis of vulva and vagina: Secondary | ICD-10-CM

## 2018-01-07 DIAGNOSIS — B3731 Acute candidiasis of vulva and vagina: Secondary | ICD-10-CM

## 2018-01-07 DIAGNOSIS — F321 Major depressive disorder, single episode, moderate: Secondary | ICD-10-CM | POA: Diagnosis not present

## 2018-01-07 MED ORDER — TERCONAZOLE 0.4 % VA CREA: 1.0000 | TOPICAL_CREAM | Freq: Every day | VAGINAL | 0 refills | Status: DC

## 2018-01-07 MED ORDER — ESCITALOPRAM OXALATE 10 MG PO TABS: 10.0000 mg | ORAL_TABLET | Freq: Every day | ORAL | 1 refills | Status: DC

## 2018-01-07 NOTE — Progress Notes (Signed)
Chief Complaint  Patient presents with  . lower abdominal pain    and back pain      27 y.o. [id number] Patient's last menstrual period was 12/29/2017. The current method of family planning is OCP (estrogen/progesterone).  Outpatient Encounter Medications as of 01/07/2018  Medication Sig  . acetaminophen (TYLENOL) 500 MG tablet Take 1,000 mg by mouth daily as needed.  . desogestrel-ethinyl estradiol (KARIVA) 0.15-0.02/0.01 MG (21/5) tablet Take 1 tablet by mouth daily.  . pantoprazole (PROTONIX) 40 MG tablet Take 1 tablet (40 mg total) by mouth daily. (Patient not taking: Reported on 02/04/2018)  . topiramate (TOPAMAX) 50 MG tablet Take 1-2 tablets (50-100 mg total) by mouth 2 (two) times daily. (Patient not taking: Reported on 02/04/2018)  . escitalopram (LEXAPRO) 10 MG tablet Take 1 tablet (10 mg total) by mouth daily.  Marland Kitchen terconazole (TERAZOL 7) 0.4 % vaginal cream Place 1 applicator vaginally at bedtime. (Patient not taking: Reported on 02/04/2018)  . [DISCONTINUED] sulfamethoxazole-trimethoprim (BACTRIM DS,SEPTRA DS) 800-160 MG tablet Take 1 tablet by mouth 2 (two) times daily.  . [DISCONTINUED] SUMAtriptan (IMITREX) 100 MG tablet Take 1 tablet (100 mg total) by mouth every 2 (two) hours as needed for migraine. May repeat in 2 hours if headache persists or recurs.   No facility-administered encounter medications on file as of 01/07/2018.     Subjective Mckenzie Cooley with 2 week history of vaginal burning itching lower abdominal pain Also with increase in anhedonia crying spells withdrawal  No suicidial ideation Past Medical History:  Diagnosis Date  . Acid reflux   . Anemia   . Bloating   . Childhood asthma   . History of blood transfusion   . PCOS (polycystic ovarian syndrome) 09/27/2014  . Preeclampsia   . Seasonal allergies     Past Surgical History:  Procedure Laterality Date  . CESAREAN SECTION N/A 01/02/2017   Procedure: CESAREAN SECTION;  Surgeon: Chancy Milroy, MD;  Location: [facility];  Service: Obstetrics;  Laterality: N/A;  . CHOLECYSTECTOMY  2011   Dr.Byerly  . ESOPHAGOGASTRODUODENOSCOPY N/A 10/26/2014   Procedure: ESOPHAGOGASTRODUODENOSCOPY (EGD);  Surgeon: Rogene Houston, MD;  [facility];  Service: Endoscopy;  Laterality: N/A;  . UPPER GASTROINTESTINAL ENDOSCOPY  12/05/2009  . UPPER GASTROINTESTINAL ENDOSCOPY  02/07/2009  . WISDOM TOOTH EXTRACTION    . WISDOM TOOTH EXTRACTION      OB History    Gravida  4   Para  1   Term  1   Preterm      AB  3   Living  1     SAB  3   TAB  0   Ectopic  0   Multiple  0   Live Births  1           Allergies  Allergen Reactions  . Percocet [Oxycodone-Acetaminophen] Hives and Itching  . Amoxicillin Rash    Has patient had a PCN reaction causing immediate rash, facial/tongue/throat swelling, SOB or lightheadedness with hypotension: Yes -mild rash Has patient had a PCN reaction causing severe rash involving mucus membranes or skin necrosis: No Has patient had a PCN reaction that required hospitalization: No Has patient had a PCN reaction occurring within the last 10 years: Yes If all of the above answers are "NO", then may proceed with Cephalosporin use. Has tolerated ceftriaxone & cephalexin     Social History   Socioeconomic History  . Marital status: Married  Spouse name: Not on file  . Number of children: Not on file  . Years of education: Not on file  . Highest education level: Not on file  Occupational History  . Not on file  Social Needs  . Financial resource strain: Not on file  . Food insecurity:    Worry: Not on file    Inability: Not on file  . Transportation needs:    Medical: Not on file    Non-medical: Not on file  Tobacco Use  . Smoking status: Former Smoker    Packs/day: 0.50    Years: 12.00    Pack years: 6.00    Types: Cigarettes    Last attempt to quit: 05/05/2016    Years since quitting: 1.7  . Smokeless tobacco:  Never Used  Substance and Sexual Activity  . Alcohol use: No    Alcohol/week: 0.0 standard drinks  . Drug use: No  . Sexual activity: Yes    Birth control/protection: Pill  Lifestyle  . Physical activity:    Days per week: Not on file    Minutes per session: Not on file  . Stress: Not on file  Relationships  . Social connections:    Talks on phone: Not on file    Gets together: Not on file    Attends religious service: Not on file    Active member of club or organization: Not on file    Attends meetings of clubs or organizations: Not on file    Relationship status: Not on file  Other Topics Concern  . Not on file  Social History Narrative  . Not on file    Family History  Problem Relation Age of Onset  . Hypertension Mother   . Heart disease Father   . Cancer - Cervical Maternal Aunt   . Kidney disease Maternal Grandmother   . Renal Disease Maternal Grandmother     Medications:       Current Outpatient Medications:  .  acetaminophen (TYLENOL) 500 MG tablet, Take 1,000 mg by mouth daily as needed., Disp: , Rfl:  .  desogestrel-ethinyl estradiol (KARIVA) 0.15-0.02/0.01 MG (21/5) tablet, Take 1 tablet by mouth daily., Disp: 1 Package, Rfl: 11 .  pantoprazole (PROTONIX) 40 MG tablet, Take 1 tablet (40 mg total) by mouth daily. (Patient not taking: Reported on 02/04/2018), Disp: 30 tablet, Rfl: 11 .  topiramate (TOPAMAX) 50 MG tablet, Take 1-2 tablets (50-100 mg total) by mouth 2 (two) times daily. (Patient not taking: Reported on 02/04/2018), Disp: 120 tablet, Rfl: 1 .  escitalopram (LEXAPRO) 10 MG tablet, Take 1 tablet (10 mg total) by mouth daily., Disp: 30 tablet, Rfl: 1 .  terconazole (TERAZOL 7) 0.4 % vaginal cream, Place 1 applicator vaginally at bedtime. (Patient not taking: Reported on 02/04/2018), Disp: 45 g, Rfl: 0  Objective Blood pressure 119/73, pulse 60, height 5' (1.524 m), weight 229 lb (103.9 kg), last menstrual period 12/29/2017, not currently  breastfeeding.  General WDWN female NAD Vulva:  normal appearing vulva with no masses, tenderness or lesions Vagina:  normal mucosa, curd-like discharge Cervix:  no cervical motion tenderness and no lesions Uterus:  normal size, contour, position, consistency, mobility, non-tender Adnexa: ovaries:present,  normal adnexa in size, nontender and no masses   Pertinent ROS No burning with urination, frequency or urgency No nausea, vomiting or diarrhea Nor fever chills or other constitutional symptoms   Labs or studies     Impression Diagnoses this Encounter::   ICD-10-CM   1. Current  moderate 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 episode ([place]) F32.1   2. Yeast vaginitis B37.3     Established relevant diagnosis(es):   Plan/Recommendations: Meds ordered this encounter  Medications  . escitalopram (LEXAPRO) 10 MG tablet    Sig: Take 1 tablet (10 mg total) by mouth daily.    Dispense:  30 tablet    Refill:  1  . terconazole (TERAZOL 7) 0.4 % vaginal cream    Sig: Place 1 applicator vaginally at bedtime.    Dispense:  45 g    Refill:  0    Labs or Scans Ordered: No orders of the defined types were placed in this encounter.   Management:: Begin lexapro for PPD, atypical terazol for yeast  Follow up Return in about 1 month (around 02/07/2018) for Follow up, with Dr Elonda Husky.    All questions were answered.

## 2018-01-12 ENCOUNTER — Encounter: Payer: Self-pay | Admitting: Physician Assistant

## 2018-02-04 ENCOUNTER — Ambulatory Visit (INDEPENDENT_AMBULATORY_CARE_PROVIDER_SITE_OTHER): Payer: BLUE CROSS/BLUE SHIELD | Admitting: Obstetrics & Gynecology

## 2018-02-04 ENCOUNTER — Encounter: Payer: Self-pay | Admitting: Obstetrics & Gynecology

## 2018-02-04 VITALS — BP 109/70 | HR 82 | Ht 60.0 in | Wt 231.0 lb

## 2018-02-04 DIAGNOSIS — F339 Major depressive disorder, recurrent, unspecified: Secondary | ICD-10-CM

## 2018-02-04 NOTE — Progress Notes (Signed)
Subjective:     Mckenzie Cooley is a 27 y.o. female who presents for follow up of depression. Current symptoms include anhedonia, depressed mood, difficulty concentrating and fatigue. Symptoms have been rapidly improving since that time. Patient denies suicidal attempt and suicidal thoughts with specific plan. Previous treatment includes: none. She complains of the following side effects from the treatment: none.  The following portions of the patient's history were reviewed and updated as appropriate: allergies, current medications, past family history, past medical history, past social history, past surgical history and problem list.  Review of Systems Pertinent items are noted in HPI.    Objective:    BP 109/70 (BP Location: Left Arm, Patient Position: Sitting, Cuff Size: Normal)   Pulse 82   Ht 5' (1.524 m)   Wt 231 lb (104.8 kg)   LMP 12/31/2017   BMI 45.11 kg/m   General:  alert, cooperative and no distress  Affect & Behavior:  full facial expressions, good grooming, good insight, normal perception, normal reasoning, normal speech pattern and content and normal thought patterns       Assessment:    Depression, rapidly improving      Plan:    Medications: Lexapro. Follow up: 3 months. Spent 15 minutes (>50% of visit) discussing the risks of depreseeion, the  pathophysiology, etiology, risks, and principles of treatment.

## 2018-02-23 ENCOUNTER — Encounter: Payer: Self-pay | Admitting: Physician Assistant

## 2018-02-28 ENCOUNTER — Ambulatory Visit: Payer: BLUE CROSS/BLUE SHIELD | Admitting: Family Medicine

## 2018-02-28 ENCOUNTER — Encounter: Payer: Self-pay | Admitting: Family Medicine

## 2018-02-28 VITALS — Temp 97.2°F | Ht 60.0 in | Wt 230.4 lb

## 2018-02-28 DIAGNOSIS — H66003 Acute suppurative otitis media without spontaneous rupture of ear drum, bilateral: Secondary | ICD-10-CM | POA: Diagnosis not present

## 2018-02-28 DIAGNOSIS — J0100 Acute maxillary sinusitis, unspecified: Secondary | ICD-10-CM | POA: Diagnosis not present

## 2018-02-28 MED ORDER — PSEUDOEPHEDRINE-GUAIFENESIN ER 120-1200 MG PO TB12: ORAL_TABLET | ORAL | 0 refills | Status: DC

## 2018-02-28 MED ORDER — SULFAMETHOXAZOLE-TRIMETHOPRIM 800-160 MG PO TABS: 1.0000 | ORAL_TABLET | Freq: Two times a day (BID) | ORAL | 0 refills | Status: AC

## 2018-02-28 NOTE — Progress Notes (Signed)
Chief Complaint  Patient presents with  . Sinus Problem    HPI  Patient presents today for Patient presents with upper respiratory congestion. Rhinorrhea is mild. Also having posterior drainage. There is moderate sore throat. Patient reports coughing frequently as well.  Yellow sputum noted. There is no fever, chills or sweats. The patient denies being short of breath. Onset was 6-7 days ago. Gradually worsening. Also having pain in both ears and cheeks PMH: Smoking status noted ROS: Per HPI  Objective: Temp (!) 97.2 F (36.2 C) (Oral)   Ht 5' (1.524 m)   Wt 230 lb 6 oz (104.5 kg)   BMI 44.99 kg/m  Gen: NAD, alert, cooperative with exam HEENT: NCAT, Nasal passages swollen, red TMS RED bilaterally. Max. Sinuses tender CV: RRR, good S1/S2, no murmur Resp: lungs CTA Ext: No edema, warm Neuro: Alert and oriented, No gross deficits  Assessment and plan:  1. Acute maxillary sinusitis, recurrence not specified   2. Acute suppurative otitis media of both ears without spontaneous rupture of tympanic membranes, recurrence not specified     Meds ordered this encounter  Medications  . sulfamethoxazole-trimethoprim (BACTRIM DS) 800-160 MG tablet    Sig: Take 1 tablet by mouth 2 (two) times daily for 10 days.    Dispense:  20 tablet    Refill:  0  . Pseudoephedrine-Guaifenesin (MUCINEX D MAX STRENGTH) [phone] MG TB12    Sig: Take 1 by mouth twice daily as needed for congestion.    Dispense:  12 each    Refill:  0    No orders of the defined types were placed in this encounter.   Follow up as needed.  Claretta Fraise, MD

## 2018-03-04 ENCOUNTER — Other Ambulatory Visit: Payer: Self-pay | Admitting: Obstetrics & Gynecology

## 2018-03-04 ENCOUNTER — Other Ambulatory Visit: Payer: Self-pay | Admitting: Family Medicine

## 2018-03-04 ENCOUNTER — Telehealth: Payer: Self-pay | Admitting: Physician Assistant

## 2018-03-04 MED ORDER — DESOGESTREL-ETHINYL ESTRADIOL 0.15-0.02/0.01 MG (21/5) PO TABS: 1.0000 | ORAL_TABLET | Freq: Every day | ORAL | 11 refills | Status: DC

## 2018-03-04 MED ORDER — NAPROXEN 500 MG PO TABS: 500.0000 mg | ORAL_TABLET | Freq: Two times a day (BID) | ORAL | 0 refills | Status: DC

## 2018-03-04 NOTE — Telephone Encounter (Signed)
I can send in Naprosyn for her.  Needs to go to ED if pain worsens or she develops red flag signs (fever, facial swelling, etc)

## 2018-03-04 NOTE — Telephone Encounter (Signed)
LM - aware of med at pharm and ER recommendation

## 2018-03-04 NOTE — Telephone Encounter (Signed)
Dr Lajuana Ripple = back up for Particia Nearing, can we send in meds?

## 2018-03-05 DIAGNOSIS — K0889 Other specified disorders of teeth and supporting structures: Secondary | ICD-10-CM | POA: Diagnosis not present

## 2018-03-05 DIAGNOSIS — H9202 Otalgia, left ear: Secondary | ICD-10-CM | POA: Diagnosis not present

## 2018-03-07 ENCOUNTER — Other Ambulatory Visit: Payer: Self-pay | Admitting: Obstetrics & Gynecology

## 2018-03-07 MED ORDER — ESCITALOPRAM OXALATE 10 MG PO TABS: 10.0000 mg | ORAL_TABLET | Freq: Every day | ORAL | 11 refills | Status: DC

## 2018-03-31 ENCOUNTER — Ambulatory Visit: Payer: BLUE CROSS/BLUE SHIELD | Admitting: Adult Health

## 2018-04-07 ENCOUNTER — Ambulatory Visit: Payer: BLUE CROSS/BLUE SHIELD | Admitting: Obstetrics & Gynecology

## 2018-04-07 ENCOUNTER — Encounter: Payer: Self-pay | Admitting: Obstetrics & Gynecology

## 2018-04-07 VITALS — BP 123/76 | HR 67 | Ht 60.0 in | Wt 234.5 lb

## 2018-04-07 DIAGNOSIS — R102 Pelvic and perineal pain: Secondary | ICD-10-CM | POA: Diagnosis not present

## 2018-04-07 DIAGNOSIS — M545 Low back pain, unspecified: Secondary | ICD-10-CM

## 2018-04-07 DIAGNOSIS — Z6841 Body Mass Index (BMI) 40.0 and over, adult: Secondary | ICD-10-CM

## 2018-04-08 ENCOUNTER — Encounter: Payer: Self-pay | Admitting: Obstetrics & Gynecology

## 2018-04-08 NOTE — Progress Notes (Signed)
Chief Complaint  Patient presents with  . Pelvic Pain      27 y.o. [id number] Patient's last menstrual period was 03/28/2018. The current method of family planning is OCP (estrogen/progesterone).  Outpatient Encounter Medications as of 04/07/2018  Medication Sig  . acetaminophen (TYLENOL) 500 MG tablet Take 1,000 mg by mouth daily as needed.  . desogestrel-ethinyl estradiol (KARIVA) 0.15-0.02/0.01 MG (21/5) tablet Take 1 tablet by mouth daily.  Marland Kitchen escitalopram (LEXAPRO) 10 MG tablet Take 1 tablet (10 mg total) by mouth daily.  . pantoprazole (PROTONIX) 40 MG tablet Take 1 tablet (40 mg total) by mouth daily.  . naproxen (NAPROSYN) 500 MG tablet Take 1 tablet (500 mg total) by mouth 2 (two) times daily with a meal. (prn dental pain) (Patient not taking: Reported on 04/07/2018)  . Pseudoephedrine-Guaifenesin (MUCINEX D MAX STRENGTH) [phone] MG TB12 Take 1 by mouth twice daily as needed for congestion. (Patient not taking: Reported on 04/07/2018)   No facility-administered encounter medications on file as of 04/07/2018.     Subjective Pt in for increasing low back and pelvic pain not related to menses Hurts to bend over and get back up in her low back, no pain down her legs but +pelvic pain Worse with picking up toddler No dyspreunia No discharge  Past Medical History:  Diagnosis Date  . Acid reflux   . Anemia   . Bloating   . Childhood asthma   . History of blood transfusion   . PCOS (polycystic ovarian syndrome) 09/27/2014  . Preeclampsia   . Seasonal allergies     Past Surgical History:  Procedure Laterality Date  . CESAREAN SECTION N/A 01/02/2017   Procedure: CESAREAN SECTION;  Surgeon: Chancy Milroy, MD;  Location: [facility];  Service: Obstetrics;  Laterality: N/A;  . CHOLECYSTECTOMY  2011   Dr.Byerly  . ESOPHAGOGASTRODUODENOSCOPY N/A 10/26/2014   Procedure: ESOPHAGOGASTRODUODENOSCOPY (EGD);  Surgeon: Rogene Houston, MD;  [facility];   Service: Endoscopy;  Laterality: N/A;  . UPPER GASTROINTESTINAL ENDOSCOPY  12/05/2009  . UPPER GASTROINTESTINAL ENDOSCOPY  02/07/2009  . WISDOM TOOTH EXTRACTION    . WISDOM TOOTH EXTRACTION      OB History    Gravida  4   Para  1   Term  1   Preterm      AB  3   Living  1     SAB  3   TAB  0   Ectopic  0   Multiple  0   Live Births  1           Allergies  Allergen Reactions  . Percocet [Oxycodone-Acetaminophen] Hives and Itching  . Amoxicillin Rash    Has patient had a PCN reaction causing immediate rash, facial/tongue/throat swelling, SOB or lightheadedness with hypotension: Yes -mild rash Has patient had a PCN reaction causing severe rash involving mucus membranes or skin necrosis: No Has patient had a PCN reaction that required hospitalization: No Has patient had a PCN reaction occurring within the last 10 years: Yes If all of the above answers are "NO", then may proceed with Cephalosporin use. Has tolerated ceftriaxone & cephalexin     Social History   Socioeconomic History  . Marital status: Married    Spouse name: Not on file  . Number of children: Not on file  . Years of education: Not on file  . Highest education level: Not on file  Occupational History  . Not on file  Social Needs  . Financial resource strain: Not on file  . Food insecurity:    Worry: Not on file    Inability: Not on file  . Transportation needs:    Medical: Not on file    Non-medical: Not on file  Tobacco Use  . Smoking status: Former Smoker    Packs/day: 0.50    Years: 12.00    Pack years: 6.00    Types: Cigarettes    Last attempt to quit: 05/05/2016    Years since quitting: 1.9  . Smokeless tobacco: Never Used  Substance and Sexual Activity  . Alcohol use: No    Alcohol/week: 0.0 standard drinks  . Drug use: No  . Sexual activity: Yes    Birth control/protection: Pill  Lifestyle  . Physical activity:    Days per week: Not on file    Minutes per session:  Not on file  . Stress: Not on file  Relationships  . Social connections:    Talks on phone: Not on file    Gets together: Not on file    Attends religious service: Not on file    Active member of club or organization: Not on file    Attends meetings of clubs or organizations: Not on file    Relationship status: Not on file  Other Topics Concern  . Not on file  Social History Narrative  . Not on file    Family History  Problem Relation Age of Onset  . Hypertension Mother   . Heart disease Father   . Cancer - Cervical Maternal Aunt   . Kidney disease Maternal Grandmother   . Renal Disease Maternal Grandmother     Medications:       Current Outpatient Medications:  .  acetaminophen (TYLENOL) 500 MG tablet, Take 1,000 mg by mouth daily as needed., Disp: , Rfl:  .  desogestrel-ethinyl estradiol (KARIVA) 0.15-0.02/0.01 MG (21/5) tablet, Take 1 tablet by mouth daily., Disp: 1 Package, Rfl: 11 .  escitalopram (LEXAPRO) 10 MG tablet, Take 1 tablet (10 mg total) by mouth daily., Disp: 30 tablet, Rfl: 11 .  pantoprazole (PROTONIX) 40 MG tablet, Take 1 tablet (40 mg total) by mouth daily., Disp: 30 tablet, Rfl: 11 .  naproxen (NAPROSYN) 500 MG tablet, Take 1 tablet (500 mg total) by mouth 2 (two) times daily with a meal. (prn dental pain) (Patient not taking: Reported on 04/07/2018), Disp: 30 tablet, Rfl: 0 .  Pseudoephedrine-Guaifenesin (MUCINEX D MAX STRENGTH) [phone] MG TB12, Take 1 by mouth twice daily as needed for congestion. (Patient not taking: Reported on 04/07/2018), Disp: 12 each, Rfl: 0  Objective Blood pressure 123/76, pulse 67, height 5' (1.524 m), weight 234 lb 8 oz (106.4 kg), last menstrual period 03/28/2018, not currently breastfeeding.  General WDWN female NAD Vulva:  normal appearing vulva with no masses, tenderness or lesions Vagina:  normal mucosa, no discharge Cervix:  no cervical motion tenderness and no lesions Uterus:  normal size, contour, position,  consistency, mobility, non-tender Adnexa: ovaries:present,  normal adnexa in size, nontender and no masses   Pertinent ROS No burning with urination, frequency or urgency No nausea, vomiting or diarrhea Nor fever chills or other constitutional symptoms   Labs or studies none    Impression Diagnoses this Encounter::   ICD-10-CM   1. Acute bilateral low back pain without sciatica M54.5   2. Pelvic pain R10.2   3. BMI 45.0-49.9, adult [facility]) Z68.42     Established relevant diagnosis(es):   Plan/Recommendations:  No orders of the defined types were placed in this encounter.   Labs or Scans Ordered: No orders of the defined types were placed in this encounter.   Management:: >exam does not reveal any Gyn issues >her pain involving her low back, hips etc I think are related to her weight and taking care of her toddler >I talked with Lexine Baton and her husband for 20 minutes regarding weight managemetn and my recommendations of "Eat to Live" and "The End of Dieting" by Dr Excell Seltzer, all questions were answered  Follow up Return if symptoms worsen or fail to improve.      All questions were answered.

## 2018-04-11 ENCOUNTER — Encounter: Payer: Self-pay | Admitting: Family Medicine

## 2018-04-11 ENCOUNTER — Ambulatory Visit: Payer: BLUE CROSS/BLUE SHIELD | Admitting: Family Medicine

## 2018-04-11 VITALS — BP 140/86 | HR 63 | Temp 97.7°F | Ht 61.0 in | Wt 235.0 lb

## 2018-04-11 DIAGNOSIS — T148XXA Other injury of unspecified body region, initial encounter: Secondary | ICD-10-CM

## 2018-04-11 DIAGNOSIS — G5603 Carpal tunnel syndrome, bilateral upper limbs: Secondary | ICD-10-CM

## 2018-04-11 MED ORDER — NAPROXEN 500 MG PO TABS: 500.0000 mg | ORAL_TABLET | Freq: Two times a day (BID) | ORAL | 1 refills | Status: DC

## 2018-04-11 NOTE — Progress Notes (Signed)
BP 140/86   Pulse 63   Temp 97.7 F (36.5 C) (Oral)   Ht 5\' 1"  (1.549 m)   Wt 106.6 kg   LMP 03/28/2018   BMI 44.40 kg/m    Subjective:    Patient ID: Mckenzie Cooley, female    DOB: 07/05/1990, 27 y.o.   MRN: [id number]  HPI: Mckenzie Cooley is a 27 y.o. female presenting on 04/11/2018 for bilateral carpal tunnel pain. Pain began 1 month after starting Topamax in June. She noted numbness and tingling in fingers that has progressively worsened. Pain is throbbing and sharp in nature. It is worse in her palms and it radiates into all 5 of her fingers and forearms, L>R.  Patient is left-handed.  Pain keeps patient from sleeping at night and is affecting her driving. She started a new job 3 months ago working at [employer] and is constantly using her hands. For the last week patient has been dropping things. Patient had similar symptoms during her pregnancy for which she used braces and tylenol. Patient has not recently tried the braces or taken anything for the pain. She denies injury or trauma to the neck or upper extremities. She denies recent illness, fever, chest pain, sob, or skin changes.   Relevant past medical, surgical, family and social history reviewed and updated as indicated. Interim medical history since our last visit reviewed. Allergies and medications reviewed and updated.  Review of Systems  Constitutional: Positive for activity change. Negative for chills and fever.  HENT: Negative.   Respiratory: Negative for shortness of breath.   Cardiovascular: Negative for chest pain and leg swelling.  Musculoskeletal: Positive for myalgias (bilateral forearm). Negative for arthralgias, joint swelling, neck pain and neck stiffness.  Skin: Negative.   Neurological: Positive for weakness and numbness (bilateral metacarpals 1-5). Negative for headaches.    Per HPI unless specifically indicated above   Allergies as of 04/11/2018      Reactions   Percocet [oxycodone-acetaminophen]  Hives, Itching   Amoxicillin Rash   Has patient had a PCN reaction causing immediate rash, facial/tongue/throat swelling, SOB or lightheadedness with hypotension: Yes -mild rash Has patient had a PCN reaction causing severe rash involving mucus membranes or skin necrosis: No Has patient had a PCN reaction that required hospitalization: No Has patient had a PCN reaction occurring within the last 10 years: Yes If all of the above answers are "NO", then may proceed with Cephalosporin use. Has tolerated ceftriaxone & cephalexin       Medication List        Accurate as of 04/11/18  4:56 PM. Always use your most recent med list.          acetaminophen 500 MG tablet Commonly known as:  TYLENOL Take 1,000 mg by mouth daily as needed.   desogestrel-ethinyl estradiol 0.15-0.02/0.01 MG (21/5) tablet Commonly known as:  KARIVA,AZURETTE,MIRCETTE Take 1 tablet by mouth daily.   escitalopram 10 MG tablet Commonly known as:  LEXAPRO Take 1 tablet (10 mg total) by mouth daily.   pantoprazole 40 MG tablet Commonly known as:  PROTONIX Take 1 tablet (40 mg total) by mouth daily.          Objective:    BP 140/86   Pulse 63   Temp 97.7 F (36.5 C) (Oral)   Ht 5\' 1"  (1.549 m)   Wt 106.6 kg   LMP 03/28/2018   BMI 44.40 kg/m   Wt Readings from Last 3 Encounters:  04/11/18 106.6 kg  04/07/18 106.4 kg  02/28/18 104.5 kg    Physical Exam  Constitutional: She is oriented to person, place, and time. She appears well-developed and well-nourished. No distress.  HENT:  Head: Normocephalic and atraumatic.  Eyes: Conjunctivae and EOM are normal.  Neck: Normal range of motion. Neck supple.  Cardiovascular: Normal rate, regular rhythm, normal heart sounds and intact distal pulses. Exam reveals no gallop and no friction rub.  No murmur heard. Pulmonary/Chest: Effort normal and breath sounds normal.  Musculoskeletal: She exhibits tenderness. She exhibits no edema or deformity.       Right  wrist: She exhibits tenderness.       Left wrist: She exhibits tenderness.  +Tinel's bilaterally, -Phalen's sign  Neurological: She is alert and oriented to person, place, and time. She displays no atrophy. A sensory deficit (numbess and tingling bilaterally upper extremities) is present.  Skin: Skin is warm. No rash noted. No erythema.  Psychiatric: She has a normal mood and affect.       Assessment & Plan:   Problem List Items Addressed This Visit    None    Visit Diagnoses    Bilateral carpal tunnel syndrome    -  Primary   Relevant Medications   naproxen (NAPROSYN) 500 MG tablet   Muscle strain       Bilateral forearm muscle strain, recommend Naprosyn and carpal tunnel bracing   Relevant Medications   naproxen (NAPROSYN) 500 MG tablet    Bilateral Carpal Tunnel Syndrome Patient presents with 5 months of numbness and tingling in all 5 fingers and bilateral wrist pain, L>R. Patient is left handed and using her hands constantly at work. She has been dropping things for 1 week and pain is keeping patient up at night. On exam positive Tinel's sign bilaterally. Patient had similar symptoms during her pregnancy for which she used braces and tylenol. I recommend patient try braces at night and Naproxen for symptoms. If symptoms do not improve we will reevaluate in 2 weeks.   Myalgias  Patient with muscle pain in forearms bilaterally for the last couple months. Patient started a new job 3 months ago and using her arms/hands. She is tender and sore on exam in her palms and forearms. I recommend Naproxen for inflammation  Follow up plan: Return if symptoms worsen or fail to improve.  Counseling provided for all of the vaccine components No orders of the defined types were placed in this encounter.  Patient seen and examined with cadence Kathlen Mody, [place] student, agree with assessment and plan above.  If patient is no better in a couple weeks then come back and we can possibly do  injections. Caryl Pina, MD [facility] 04/11/2018, 4:56 PM

## 2018-04-17 ENCOUNTER — Encounter: Payer: Self-pay | Admitting: Family Medicine

## 2018-04-17 DIAGNOSIS — G5603 Carpal tunnel syndrome, bilateral upper limbs: Secondary | ICD-10-CM

## 2018-04-27 ENCOUNTER — Ambulatory Visit (INDEPENDENT_AMBULATORY_CARE_PROVIDER_SITE_OTHER): Payer: Self-pay

## 2018-04-27 ENCOUNTER — Ambulatory Visit (INDEPENDENT_AMBULATORY_CARE_PROVIDER_SITE_OTHER): Payer: BLUE CROSS/BLUE SHIELD | Admitting: Orthopaedic Surgery

## 2018-04-27 ENCOUNTER — Encounter (INDEPENDENT_AMBULATORY_CARE_PROVIDER_SITE_OTHER): Payer: Self-pay | Admitting: Orthopaedic Surgery

## 2018-04-27 ENCOUNTER — Other Ambulatory Visit (INDEPENDENT_AMBULATORY_CARE_PROVIDER_SITE_OTHER): Payer: Self-pay | Admitting: Radiology

## 2018-04-27 VITALS — BP 130/79 | HR 59 | Ht 61.0 in | Wt 235.0 lb

## 2018-04-27 DIAGNOSIS — M79642 Pain in left hand: Secondary | ICD-10-CM | POA: Diagnosis not present

## 2018-04-27 DIAGNOSIS — M79641 Pain in right hand: Secondary | ICD-10-CM | POA: Diagnosis not present

## 2018-04-27 MED ORDER — METHYLPREDNISOLONE 4 MG PO TABS: ORAL_TABLET | ORAL | 0 refills | Status: DC

## 2018-04-27 NOTE — Addendum Note (Signed)
Addended by: Deeann Dowse R on: 04/27/2018 04:21 PM   Modules accepted: Orders

## 2018-04-27 NOTE — Progress Notes (Signed)
Office Visit Note   Patient: Mckenzie Cooley           Date of Birth: 03-15-1991           MRN: [id number] Visit Date: 04/27/2018              Requested by: Mckenzie Parisian Fransisca Kaufmann, MD [place], [place] [id number] PCP: Mckenzie Sleeper, PA-C   Assessment & Plan: Visit Diagnoses:  1. Pain in left hand   2. Pain in right hand     Plan: Symptoms consistent with bilateral carpal tunnel syndrome to the point of compromise.  We will try a Medrol Dosepak in order EMGs and nerve conduction studies  Follow-Up Instructions: Return after EMG's.   Orders:  Orders Placed This Encounter  Procedures  . XR Hand Complete Left  . XR Hand Complete Right   No orders of the defined types were placed in this encounter.     Procedures: No procedures performed   Clinical Data: No additional findings.   Subjective: Chief Complaint  Patient presents with  . New Patient (Initial Visit)    BI LATERAL CARPAL TUNNEL SYNDROME, NOTICED SYMPTOMS GOT WORSE WHENSTARTED TAKING MIGRAINE MDS IN JULY 2019   Mckenzie Cooley is 27 years old, accompanied by her husband and young daughter and here for evaluation of bilateral hand pain with numbness and tingling.  She first noted onset several years ago during her late pregnancy.  This resolved after delivery.  She recently was prescribed Topamax for migraine headaches and thinks that there may be some relationship between the recurrence of the numbness and tingling in both hands and that medicine.  She is a little bit better since she stopped the medicine.  She is been taking Naprosyn which is also helping.  She is having a little more trouble on the left than the right hand but having significant problems with numbness tingling pain and dropping objects.  HPI  Review of Systems  Constitutional: Negative for fatigue and fever.  HENT: Negative for ear pain.   Eyes: Negative for pain.  Respiratory: Negative for cough and shortness of breath.   Cardiovascular:  Negative for leg swelling.  Gastrointestinal: Negative for constipation and diarrhea.  Genitourinary: Negative for difficulty urinating.  Musculoskeletal: Negative for back pain and neck pain.  Skin: Negative for rash.  Allergic/Immunologic: Negative for food allergies.  Neurological: Positive for weakness and numbness.  Hematological: Does not bruise/bleed easily.  Psychiatric/Behavioral: Positive for sleep disturbance.     Objective: Vital Signs: BP 130/79 (BP Location: Left Arm, Patient Position: Sitting, Cuff Size: Normal)   Pulse (!) 59   Ht 5\' 1"  (1.549 m)   Wt 235 lb (106.6 kg)   LMP 03/28/2018   BMI 44.40 kg/m   Physical Exam  Constitutional: She is oriented to person, place, and time. She appears well-developed and well-nourished.  HENT:  Mouth/Throat: Oropharynx is clear and moist.  Eyes: Pupils are equal, round, and reactive to light. EOM are normal.  Pulmonary/Chest: Effort normal.  Neurological: She is alert and oriented to person, place, and time.  Skin: Skin is warm and dry.  Psychiatric: She has a normal mood and affect. Her behavior is normal.    Ortho Exam awake alert and oriented x3.  Comfortable sitting.  Positive Phalen's and Tinel's to both wrists.  Good grip and good release..  Able to oppose thumb to little finger with good strength.  No obvious atrophy.  No swelling. Specialty Comments:  No specialty comments available.  Imaging: Xr Hand Complete Left  Result Date: 04/27/2018 The left hand were obtained in several projections without evidence of acute change.  No obvious arthritis.  Carpus appears to be intact  Xr Hand Complete Right  Result Date: 04/27/2018 The right hand obtained in several projections.  No acute changes.  No change with in the carpus.    PMFS History: Patient Active Problem List   Diagnosis Date Noted  . Migraine 12/09/2017  . Iron deficiency 07/20/2017  . Viral upper respiratory tract infection 03/24/2017  . Allergic  otitis media of both ears 03/24/2017  . History of severe pre-eclampsia 02/23/2017  . History of anemia 02/23/2017  . Previous cesarean section 02/09/2017  . H/O severe preeclampsia 02/09/2017  . Postoperative anemia due to acute blood loss 01/06/2017  . Smoker 09/27/2014  . Asthma 07/01/2011  . Gastroesophageal reflux disease 07/01/2011   Past Medical History:  Diagnosis Date  . Acid reflux   . Anemia   . Bloating   . Childhood asthma   . History of blood transfusion   . PCOS (polycystic ovarian syndrome) 09/27/2014  . Preeclampsia   . Seasonal allergies     Family History  Problem Relation Age of Onset  . Hypertension Mother   . Heart disease Father   . Cancer - Cervical Maternal Aunt   . Kidney disease Maternal Grandmother   . Renal Disease Maternal Grandmother     Past Surgical History:  Procedure Laterality Date  . CESAREAN SECTION N/A 01/02/2017   Procedure: CESAREAN SECTION;  Surgeon: Mckenzie Milroy, MD;  Location: [facility];  Service: Obstetrics;  Laterality: N/A;  . CHOLECYSTECTOMY  2011   Dr.Byerly  . ESOPHAGOGASTRODUODENOSCOPY N/A 10/26/2014   Procedure: ESOPHAGOGASTRODUODENOSCOPY (EGD);  Surgeon: Mckenzie Houston, MD;  [facility];  Service: Endoscopy;  Laterality: N/A;  . UPPER GASTROINTESTINAL ENDOSCOPY  12/05/2009  . UPPER GASTROINTESTINAL ENDOSCOPY  02/07/2009  . WISDOM TOOTH EXTRACTION    . WISDOM TOOTH EXTRACTION     Social History   Occupational History  . Not on file  Tobacco Use  . Smoking status: Former Smoker    Packs/day: 0.50    Years: 12.00    Pack years: 6.00    Types: Cigarettes    Last attempt to quit: 05/05/2016    Years since quitting: 1.9  . Smokeless tobacco: Never Used  Substance and Sexual Activity  . Alcohol use: No    Alcohol/week: 0.0 standard drinks  . Drug use: No  . Sexual activity: Yes    Birth control/protection: Pill

## 2018-04-29 ENCOUNTER — Other Ambulatory Visit: Payer: Self-pay | Admitting: Obstetrics & Gynecology

## 2018-04-29 MED ORDER — ESCITALOPRAM OXALATE 20 MG PO TABS: 10.0000 mg | ORAL_TABLET | Freq: Every day | ORAL | 11 refills | Status: DC

## 2018-05-02 ENCOUNTER — Telehealth (INDEPENDENT_AMBULATORY_CARE_PROVIDER_SITE_OTHER): Payer: Self-pay | Admitting: Orthopaedic Surgery

## 2018-05-02 NOTE — Telephone Encounter (Signed)
Patient left a voicemail checking the status of her referral for a nerve conduction test.  Patient requested a return call.

## 2018-05-03 ENCOUNTER — Other Ambulatory Visit (INDEPENDENT_AMBULATORY_CARE_PROVIDER_SITE_OTHER): Payer: Self-pay | Admitting: Radiology

## 2018-05-03 DIAGNOSIS — M79642 Pain in left hand: Secondary | ICD-10-CM

## 2018-05-03 DIAGNOSIS — M79641 Pain in right hand: Secondary | ICD-10-CM

## 2018-05-03 NOTE — Telephone Encounter (Signed)
Sent in referral and notified pt

## 2018-05-06 ENCOUNTER — Other Ambulatory Visit: Payer: Self-pay | Admitting: Physician Assistant

## 2018-05-06 ENCOUNTER — Encounter: Payer: Self-pay | Admitting: Physician Assistant

## 2018-05-06 MED ORDER — AZITHROMYCIN 250 MG PO TABS: ORAL_TABLET | ORAL | 0 refills | Status: DC

## 2018-05-09 ENCOUNTER — Ambulatory Visit: Payer: BLUE CROSS/BLUE SHIELD | Admitting: Obstetrics & Gynecology

## 2018-05-18 ENCOUNTER — Ambulatory Visit (INDEPENDENT_AMBULATORY_CARE_PROVIDER_SITE_OTHER): Payer: BLUE CROSS/BLUE SHIELD | Admitting: Physical Medicine and Rehabilitation

## 2018-05-18 ENCOUNTER — Encounter (INDEPENDENT_AMBULATORY_CARE_PROVIDER_SITE_OTHER): Payer: Self-pay | Admitting: Physical Medicine and Rehabilitation

## 2018-05-18 DIAGNOSIS — R202 Paresthesia of skin: Secondary | ICD-10-CM | POA: Diagnosis not present

## 2018-05-18 NOTE — Progress Notes (Signed)
 .  Numeric Pain Rating Scale and Functional Assessment Average Pain 4   In the last MONTH (on 0-10 scale) has pain interfered with the following?  1. General activity like being  able to carry out your everyday physical activities such as walking, climbing stairs, carrying groceries, or moving a chair?  Rating(7)     

## 2018-05-23 NOTE — Progress Notes (Signed)
Mckenzie Cooley - 27 y.o. female MRN [id number]  Date of birth: [birth year]  Office Visit Note: Visit Date: 05/18/2018 PCP: Terald Sleeper, PA-C Referred by: Terald Sleeper, PA-C  Subjective: Chief Complaint  Patient presents with  . Left Hand - Numbness, Tingling  . Right Hand - Numbness, Tingling   HPI: Mckenzie Cooley is a 26 y.o. female who comes in today At the request of Joni Fears for electrodiagnostic study of both upper limbs.  Patient is left-hand dominant and reports that she has had carpal tunnel syndrome essentially most of her life.  She does report some worsening symptoms during pregnancy.  She also reports recent change of medication using Topamax for migraine headaches.  She was told that could cause some tingling numbness type paresthesias.  We did discuss this that it can cause some dysesthesias by itself but it would not necessarily worsen specific findings like a carpal tunnel syndrome.  Nonetheless her complaints are basically pain numbness and tingling in the right hand predominantly the index and middle finger and on the left hand the middle and ring finger.  She reports the left hand is worse but they both started about the same time.  This is been ongoing for several months now and is just gotten worse over time.  She at this point is really dropping objects and is wearing her quite a bit.  She reports using her hands makes her symptoms worse.  She denies any frank radicular symptoms or specific trauma.  ROS Otherwise per HPI.  Assessment & Plan: Visit Diagnoses:  1. 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   There is no significant electrodiagnostic evidence of any other focal nerve entrapment, brachial plexopathy or cervical radiculopathy.    Recommendations: 1.  Follow-up with referring physician. 2.  Continue current management of symptoms. 3.  Continue use of resting splint at night-time and as needed during the day. 4.  Suggest surgical evaluation.  Meds & Orders: No orders of the defined types were placed in this encounter.   Orders Placed This Encounter  Procedures  . NCV with EMG (electromyography)    Follow-up: Return for Joni Fears, MD.   Procedures: No procedures performed  EMG & NCV Findings: Evaluation of the left median motor nerve showed reduced amplitude (3.9 mV) and decreased conduction velocity (Elbow-Wrist, 46 m/s).  The right median motor nerve showed prolonged distal onset latency (4.4 ms), reduced amplitude (3.9 mV), and decreased conduction velocity (Elbow-Wrist, 41 m/s).  The left median (across palm) sensory nerve showed prolonged distal peak latency (Wrist, 4.9 ms) and reduced amplitude (3.9 V).  The right median (across palm) sensory nerve showed prolonged distal peak latency (Wrist, 4.8 ms), reduced amplitude (4.4 V), and prolonged distal peak latency (Palm, 4.1 ms).  All remaining nerves (as indicated in the following tables) were within normal limits.  Left vs. Right side comparison data for the median motor nerve indicates abnormal L-R latency difference (1.1 ms).  The ulnar motor nerve indicates abnormal L-R velocity difference (B Elbow-Wrist, 14 m/s).  All remaining left vs. right side differences were within normal limits.    All examined muscles (as indicated in the following table) showed no evidence of electrical instability.    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   There is no significant electrodiagnostic evidence of any other focal nerve entrapment, brachial plexopathy or cervical radiculopathy.    Recommendations: 1.  Follow-up with referring physician. 2.  Continue current management of symptoms. 3.  Continue use of resting splint at night-time and as needed during the day. 4.  Suggest surgical evaluation.  ___________________________ Laurence Spates FAAPMR Board Certified, American Board of Physical Medicine and Rehabilitation    Nerve Conduction Studies Anti Sensory Summary Table   Stim Site NR Peak (ms) Norm Peak (ms) P-T Amp (V) Norm P-T Amp Site1 Site2 Delta-P (ms) Dist (cm) Vel (m/s) Norm Vel (m/s)  Left Median Acr Palm Anti Sensory (2nd Digit)  33.4C  Wrist    *4.9 <3.6 *3.9 >10 Wrist Palm 2.9 0.0    Palm    2.0 <2.0 8.6         Right Median Acr Palm Anti Sensory (2nd Digit)  34C  Wrist    *4.8 <3.6 *4.4 >10 Wrist Palm 0.7 0.0    Palm    *4.1 <2.0 5.7         Left Radial Anti Sensory (Base 1st Digit)  33.3C  Wrist    1.9 <3.1 11.5  Wrist Base 1st Digit 1.9 0.0    Right Radial Anti Sensory (Base 1st Digit)  33.8C  Wrist    1.9 <3.1 29.1  Wrist Base 1st Digit 1.9 0.0    Left Ulnar Anti Sensory (5th Digit)  33.5C  Wrist    2.9 <3.7 26.4 >15.0 Wrist 5th Digit 2.9 14.0 48 >38  Right Ulnar Anti Sensory (5th Digit)  34C  Wrist    2.8 <3.7 30.2 >15.0 Wrist 5th Digit 2.8 14.0 50 >38   Motor Summary Table   Stim Site NR Onset (ms) Norm Onset (ms) O-P Amp (mV) Norm O-P Amp Site1 Site2 Delta-0 (ms) Dist (cm) Vel (m/s) Norm Vel (m/s)  Left Median Motor (Abd Poll Brev)  33.4C  Wrist    3.3 <4.2 *3.9 >5 Elbow Wrist 3.7 17.0 *46 >50  Elbow    7.0  4.0         Right Median Motor (Abd Poll Brev)  33.7C  Wrist    *4.4 <4.2 *3.9 >5 Elbow Wrist 4.0 16.5 *41 >50  Elbow    8.4  3.4         Left Ulnar Motor (Abd Dig Min)  33.5C  Wrist    2.5 <4.2 7.5 >3 B Elbow Wrist 2.4 17.0 71 >53  B Elbow    4.9  7.9  A Elbow B Elbow 1.2 11.0 92 >53  A Elbow    6.1  8.3         Right Ulnar Motor (Abd Dig Min)  33.6C  Wrist    2.2 <4.2 8.6 >3 B Elbow Wrist 3.0 17.0 57 >53  B Elbow     5.2  8.9  A Elbow B Elbow 1.1 10.0 91 >53  A Elbow    6.3  8.5          EMG   Side Muscle Nerve Root Ins Act Fibs Psw Amp Dur Poly Recrt Int Fraser Din Comment  Left Abd Poll Brev Median C8-T1 Nml Nml Nml Nml Nml 0 Nml Nml   Left 1stDorInt Ulnar C8-T1 Nml Nml Nml Nml Nml 0 Nml Nml   Left PronatorTeres Median C6-7 Nml Nml Nml Nml Nml  0 Nml Nml   Left Biceps Musculocut C5-6 Nml Nml Nml Nml Nml 0 Nml Nml   Left Deltoid Axillary C5-6 Nml Nml Nml Nml Nml 0 Nml Nml     Nerve Conduction Studies Anti Sensory Left/Right Comparison   Stim Site L Lat (ms) R Lat (ms) L-R Lat (ms) L Amp (V) R Amp (V) L-R Amp (%) Site1 Site2 L Vel (m/s) R Vel (m/s) L-R Vel (m/s)  Median Acr Palm Anti Sensory (2nd Digit)  33.4C  Wrist *4.9 *4.8 0.1 *3.9 *4.4 11.4 Wrist Palm     Palm 2.0 *4.1 2.1 8.6 5.7 33.7       Radial Anti Sensory (Base 1st Digit)  33.3C  Wrist 1.9 1.9 0.0 11.5 29.1 60.5 Wrist Base 1st Digit     Ulnar Anti Sensory (5th Digit)  33.5C  Wrist 2.9 2.8 0.1 26.4 30.2 12.6 Wrist 5th Digit 48 50 2   Motor Left/Right Comparison   Stim Site L Lat (ms) R Lat (ms) L-R Lat (ms) L Amp (mV) R Amp (mV) L-R Amp (%) Site1 Site2 L Vel (m/s) R Vel (m/s) L-R Vel (m/s)  Median Motor (Abd Poll Brev)  33.4C  Wrist 3.3 *4.4 *1.1 *3.9 *3.9 0.0 Elbow Wrist *46 *41 5  Elbow 7.0 8.4 1.4 4.0 3.4 15.0       Ulnar Motor (Abd Dig Min)  33.5C  Wrist 2.5 2.2 0.3 7.5 8.6 12.8 B Elbow Wrist 71 57 *14  B Elbow 4.9 5.2 0.3 7.9 8.9 11.2 A Elbow B Elbow 92 91 1  A Elbow 6.1 6.3 0.2 8.3 8.5 2.4          Waveforms:                     Clinical History: No specialty comments available.   She reports that she quit smoking about 2 years ago. Her smoking use included cigarettes. She has a 6.00 pack-year smoking history. She has never used smokeless tobacco. No results for input(s): HGBA1C, LABURIC in the last 8760 hours.  Objective:  VS:  HT:    WT:   BMI:     BP:   HR: bpm  TEMP: ( )  RESP:  Physical Exam   Constitutional: She is oriented to person, place, and time.  Musculoskeletal:  Inspection reveals no atrophy of the bilateral APB or FDI or hand intrinsics. There is no swelling, color changes, allodynia or dystrophic changes. There is 5 out of 5 strength in the bilateral wrist extension, finger abduction and long finger flexion. There is intact sensation to light touch in all dermatomal and peripheral nerve distributions. There is a positive Tinel's test at the bilateral wrists   There is a negative Hoffmann's test bilaterally.  Neurological: She is alert and oriented to person, place, and time. She exhibits normal muscle tone. Coordination normal.  Skin: Skin is warm and dry. No erythema.    Ortho Exam Imaging: No results found.  Past Medical/Family/Surgical/Social History: Medications & Allergies reviewed per EMR, new medications updated. Patient Active Problem List   Diagnosis Date Noted  . Migraine 12/09/2017  . Iron deficiency 07/20/2017  . Viral upper respiratory tract infection 03/24/2017  . Allergic otitis media of both ears 03/24/2017  . History of severe pre-eclampsia 02/23/2017  . History of anemia 02/23/2017  . Previous cesarean section 02/09/2017  . H/O severe preeclampsia 02/09/2017  . Postoperative anemia due to acute blood loss 01/06/2017  . Smoker 09/27/2014  . Asthma 07/01/2011  .  Gastroesophageal reflux disease 07/01/2011   Past Medical History:  Diagnosis Date  . Acid reflux   . Anemia   . Bloating   . Childhood asthma   . History of blood transfusion   . PCOS (polycystic ovarian syndrome) 09/27/2014  . Preeclampsia   . Seasonal allergies    Family History  Problem Relation Age of Onset  . Hypertension Mother   . Heart disease Father   . Cancer - Cervical Maternal Aunt   . Kidney disease Maternal Grandmother   . Renal Disease Maternal Grandmother    Past Surgical History:  Procedure Laterality Date  . CESAREAN SECTION N/A 01/02/2017   Procedure:  CESAREAN SECTION;  Surgeon: Chancy Milroy, MD;  Location: [facility];  Service: Obstetrics;  Laterality: N/A;  . CHOLECYSTECTOMY  2011   Dr.Byerly  . ESOPHAGOGASTRODUODENOSCOPY N/A 10/26/2014   Procedure: ESOPHAGOGASTRODUODENOSCOPY (EGD);  Surgeon: Rogene Houston, MD;  [facility];  Service: Endoscopy;  Laterality: N/A;  . UPPER GASTROINTESTINAL ENDOSCOPY  12/05/2009  . UPPER GASTROINTESTINAL ENDOSCOPY  02/07/2009  . WISDOM TOOTH EXTRACTION    . WISDOM TOOTH EXTRACTION     Social History   Occupational History  . Not on file  Tobacco Use  . Smoking status: Former Smoker    Packs/day: 0.50    Years: 12.00    Pack years: 6.00    Types: Cigarettes    Last attempt to quit: 05/05/2016    Years since quitting: 2.0  . Smokeless tobacco: Never Used  Substance and Sexual Activity  . Alcohol use: No    Alcohol/week: 0.0 standard drinks  . Drug use: No  . Sexual activity: Yes    Birth control/protection: Pill

## 2018-05-24 NOTE — Procedures (Signed)
EMG & NCV Findings: Evaluation of the left median motor nerve showed reduced amplitude (3.9 mV) and decreased conduction velocity (Elbow-Wrist, 46 m/s).  The right median motor nerve showed prolonged distal onset latency (4.4 ms), reduced amplitude (3.9 mV), and decreased conduction velocity (Elbow-Wrist, 41 m/s).  The left median (across palm) sensory nerve showed prolonged distal peak latency (Wrist, 4.9 ms) and reduced amplitude (3.9 V).  The right median (across palm) sensory nerve showed prolonged distal peak latency (Wrist, 4.8 ms), reduced amplitude (4.4 V), and prolonged distal peak latency (Palm, 4.1 ms).  All remaining nerves (as indicated in the following tables) were within normal limits.  Left vs. Right side comparison data for the median motor nerve indicates abnormal L-R latency difference (1.1 ms).  The ulnar motor nerve indicates abnormal L-R velocity difference (B Elbow-Wrist, 14 m/s).  All remaining left vs. right side differences were within normal limits.    All examined muscles (as indicated in the following table) showed no evidence of electrical instability.    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   There is no significant electrodiagnostic evidence of any other focal nerve entrapment, brachial plexopathy or cervical radiculopathy.   Recommendations: 1.  Follow-up with referring physician. 2.  Continue current management of symptoms. 3.  Continue use of resting splint at night-time and as needed during the day. 4.  Suggest surgical evaluation.  ___________________________ Laurence Spates FAAPMR Board Certified, American Board of Physical Medicine and Rehabilitation    Nerve Conduction Studies Anti Sensory Summary Table   Stim Site NR Peak (ms) Norm Peak (ms) P-T Amp (V) Norm P-T Amp Site1 Site2  Delta-P (ms) Dist (cm) Vel (m/s) Norm Vel (m/s)  Left Median Acr Palm Anti Sensory (2nd Digit)  33.4C  Wrist    *4.9 <3.6 *3.9 >10 Wrist Palm 2.9 0.0    Palm    2.0 <2.0 8.6         Right Median Acr Palm Anti Sensory (2nd Digit)  34C  Wrist    *4.8 <3.6 *4.4 >10 Wrist Palm 0.7 0.0    Palm    *4.1 <2.0 5.7         Left Radial Anti Sensory (Base 1st Digit)  33.3C  Wrist    1.9 <3.1 11.5  Wrist Base 1st Digit 1.9 0.0    Right Radial Anti Sensory (Base 1st Digit)  33.8C  Wrist    1.9 <3.1 29.1  Wrist Base 1st Digit 1.9 0.0    Left Ulnar Anti Sensory (5th Digit)  33.5C  Wrist    2.9 <3.7 26.4 >15.0 Wrist 5th Digit 2.9 14.0 48 >38  Right Ulnar Anti Sensory (5th Digit)  34C  Wrist    2.8 <3.7 30.2 >15.0 Wrist 5th Digit 2.8 14.0 50 >38   Motor Summary Table   Stim Site NR Onset (ms) Norm Onset (ms) O-P Amp (mV) Norm O-P Amp Site1 Site2 Delta-0 (ms) Dist (cm) Vel (m/s) Norm Vel (m/s)  Left Median Motor (Abd Poll Brev)  33.4C  Wrist    3.3 <4.2 *3.9 >5 Elbow Wrist 3.7 17.0 *46 >50  Elbow    7.0  4.0         Right Median Motor (Abd Poll Brev)  33.7C  Wrist    *4.4 <4.2 *3.9 >5 Elbow Wrist 4.0 16.5 *41 >50  Elbow    8.4  3.4         Left Ulnar Motor (Abd Dig Min)  33.5C  Wrist    2.5 <4.2 7.5 >3 B Elbow Wrist 2.4 17.0 71 >53  B Elbow    4.9  7.9  A Elbow B Elbow 1.2 11.0 92 >53  A Elbow    6.1  8.3         Right Ulnar Motor (Abd Dig Min)  33.6C  Wrist    2.2 <4.2 8.6 >3 B Elbow Wrist 3.0 17.0 57 >53  B Elbow    5.2  8.9  A Elbow B Elbow 1.1 10.0 91 >53  A Elbow    6.3  8.5          EMG   Side Muscle Nerve Root Ins Act Fibs Psw Amp Dur Poly Recrt Int Fraser Din Comment  Left Abd Poll Brev Median C8-T1 Nml Nml Nml Nml Nml 0 Nml Nml   Left 1stDorInt Ulnar C8-T1 Nml Nml Nml Nml Nml 0 Nml Nml   Left PronatorTeres Median C6-7 Nml Nml Nml Nml Nml 0 Nml Nml   Left Biceps Musculocut C5-6 Nml Nml Nml Nml Nml 0 Nml Nml   Left Deltoid Axillary C5-6 Nml Nml Nml Nml Nml 0 Nml Nml     Nerve  Conduction Studies Anti Sensory Left/Right Comparison   Stim Site L Lat (ms) R Lat (ms) L-R Lat (ms) L Amp (V) R Amp (V) L-R Amp (%) Site1 Site2 L Vel (m/s) R Vel (m/s) L-R Vel (m/s)  Median Acr Palm Anti Sensory (2nd Digit)  33.4C  Wrist *4.9 *4.8 0.1 *3.9 *4.4 11.4 Wrist Palm     Palm 2.0 *4.1 2.1 8.6 5.7 33.7       Radial Anti Sensory (Base 1st Digit)  33.3C  Wrist 1.9 1.9 0.0 11.5 29.1 60.5 Wrist Base 1st Digit     Ulnar Anti Sensory (5th Digit)  33.5C  Wrist 2.9 2.8 0.1 26.4 30.2 12.6 Wrist 5th Digit 48 50 2   Motor Left/Right Comparison   Stim Site L Lat (ms) R Lat (ms) L-R Lat (ms) L Amp (mV) R Amp (mV) L-R Amp (%) Site1 Site2 L Vel (m/s) R Vel (m/s) L-R Vel (m/s)  Median Motor (Abd Poll Brev)  33.4C  Wrist 3.3 *4.4 *1.1 *3.9 *3.9 0.0 Elbow Wrist *46 *41 5  Elbow 7.0 8.4 1.4 4.0 3.4 15.0       Ulnar Motor (Abd Dig Min)  33.5C  Wrist 2.5 2.2 0.3 7.5 8.6 12.8 B Elbow Wrist 71 57 *14  B Elbow 4.9 5.2 0.3 7.9 8.9 11.2 A Elbow B Elbow 92 91 1  A Elbow 6.1 6.3 0.2 8.3 8.5 2.4          Waveforms:

## 2018-06-01 ENCOUNTER — Ambulatory Visit (INDEPENDENT_AMBULATORY_CARE_PROVIDER_SITE_OTHER): Payer: BLUE CROSS/BLUE SHIELD | Admitting: Orthopaedic Surgery

## 2018-06-01 ENCOUNTER — Encounter (INDEPENDENT_AMBULATORY_CARE_PROVIDER_SITE_OTHER): Payer: Self-pay | Admitting: Orthopaedic Surgery

## 2018-06-01 VITALS — BP 144/79 | HR 62 | Ht 61.0 in | Wt 235.0 lb

## 2018-06-01 DIAGNOSIS — G5602 Carpal tunnel syndrome, left upper limb: Secondary | ICD-10-CM | POA: Diagnosis not present

## 2018-06-01 NOTE — Progress Notes (Signed)
Office Visit Note   Patient: Mckenzie Cooley           Date of Birth: [birth year]           MRN: [id number] Visit Date: 06/01/2018              Requested by: Terald Sleeper, PA-C [address] [place], [place] [address] PCP: Terald Sleeper, PA-C   Assessment & Plan: Visit Diagnoses:  1. Carpal tunnel syndrome, left upper limb     Plan: EMG and nerve conduction studies demonstrate moderate to severe bilateral carpal tunnel syndrome.  Mckenzie Cooley is more symptomatic on the left.  After much discussion she would like to proceed with surgical release rather than pursue any further conservative treatment.  Have discussed surgery, potential complications, outpatient nature, nerve block potential time out of work.  Also having similar symptoms on the right.  We could stagger surgery on that side some point in the future  Follow-Up Instructions: Return will schedule left carpal tunnel surgery.   Orders:  No orders of the defined types were placed in this encounter.  No orders of the defined types were placed in this encounter.     Procedures: No procedures performed   Clinical Data: No additional findings.   Subjective: Chief Complaint  Patient presents with  . Right Hand - Follow-up, Pain    NCS/EMG Review  . Left Hand - Follow-up, Pain    NCS/EMG Review  Patient returns to review NCS/EMG of bilateral upper extremities. She states that she has bilateral hand pain today.  Neither hand is worse or better. She describes the pain as "throbbing like a toothache".  Mckenzie Cooley is tired of dropping "things" and having numbness and tingling in her hands.  HPI  Review of Systems   Objective: Vital Signs: BP (!) 144/79   Pulse 62   Ht 5\' 1"  (1.549 m)   Wt 235 lb (106.6 kg)   BMI 44.40 kg/m   Physical Exam  Constitutional: She is oriented to person, place, and time. She appears well-developed and well-nourished.  HENT:  Mouth/Throat: Oropharynx is clear and moist.  Eyes: Pupils  are equal, round, and reactive to light. EOM are normal.  Pulmonary/Chest: Effort normal.  Neurological: She is alert and oriented to person, place, and time.  Skin: Skin is warm and dry.  Psychiatric: She has a normal mood and affect. Her behavior is normal.    Ortho Exam positive Phalen's and Tinel's of both hands.  Able to oppose thumb to little finger.  Some altered sensibility median nerve distribution of both hands but good capillary refill.  Fist and release.  Skin intact  Specialty Comments:  No specialty comments available.  Imaging: No results found.   PMFS History: Patient Active Problem List   Diagnosis Date Noted  . Carpal tunnel syndrome, left upper limb 06/01/2018  . Migraine 12/09/2017  . Iron deficiency 07/20/2017  . Viral upper respiratory tract infection 03/24/2017  . Allergic otitis media of both ears 03/24/2017  . History of severe pre-eclampsia 02/23/2017  . History of anemia 02/23/2017  . Previous cesarean section 02/09/2017  . H/O severe preeclampsia 02/09/2017  . Postoperative anemia due to acute blood loss 01/06/2017  . Smoker 09/27/2014  . Asthma 07/01/2011  . Gastroesophageal reflux disease 07/01/2011   Past Medical History:  Diagnosis Date  . Acid reflux   . Anemia   . Bloating   . Childhood asthma   . History of blood  transfusion   . PCOS (polycystic ovarian syndrome) 09/27/2014  . Preeclampsia   . Seasonal allergies     Family History  Problem Relation Age of Onset  . Hypertension Mother   . Heart disease Father   . Cancer - Cervical Maternal Aunt   . Kidney disease Maternal Grandmother   . Renal Disease Maternal Grandmother     Past Surgical History:  Procedure Laterality Date  . CESAREAN SECTION N/A 01/02/2017   Procedure: CESAREAN SECTION;  Surgeon: Chancy Milroy, MD;  Location: [facility];  Service: Obstetrics;  Laterality: N/A;  . CHOLECYSTECTOMY  2011   Dr.Byerly  . ESOPHAGOGASTRODUODENOSCOPY N/A 10/26/2014    Procedure: ESOPHAGOGASTRODUODENOSCOPY (EGD);  Surgeon: Rogene Houston, MD;  [facility];  Service: Endoscopy;  Laterality: N/A;  . UPPER GASTROINTESTINAL ENDOSCOPY  12/05/2009  . UPPER GASTROINTESTINAL ENDOSCOPY  02/07/2009  . WISDOM TOOTH EXTRACTION    . WISDOM TOOTH EXTRACTION     Social History   Occupational History  . Not on file  Tobacco Use  . Smoking status: Former Smoker    Packs/day: 0.50    Years: 12.00    Pack years: 6.00    Types: Cigarettes    Last attempt to quit: 05/05/2016    Years since quitting: 2.0  . Smokeless tobacco: Never Used  Substance and Sexual Activity  . Alcohol use: No    Alcohol/week: 0.0 standard drinks  . Drug use: No  . Sexual activity: Yes    Birth control/protection: Pill

## 2018-06-21 ENCOUNTER — Telehealth (INDEPENDENT_AMBULATORY_CARE_PROVIDER_SITE_OTHER): Payer: Self-pay | Admitting: Orthopaedic Surgery

## 2018-06-21 NOTE — Telephone Encounter (Signed)
Patient called to cancel her carpal tunnel release surgery at [facility] for the Jan 9th.  She is financially unable to be out of work right now.  She mentions rescheduling once she has received her tax refund. Will call to reschedule at a later date.

## 2018-06-21 NOTE — Telephone Encounter (Signed)
thanks

## 2018-06-21 NOTE — Telephone Encounter (Signed)
FYI

## 2018-06-27 ENCOUNTER — Other Ambulatory Visit: Payer: Self-pay | Admitting: Obstetrics & Gynecology

## 2018-06-27 MED ORDER — ESCITALOPRAM OXALATE 10 MG PO TABS: 10.0000 mg | ORAL_TABLET | Freq: Every day | ORAL | 5 refills | Status: DC

## 2018-07-12 ENCOUNTER — Other Ambulatory Visit: Payer: Self-pay | Admitting: Family Medicine

## 2018-07-12 ENCOUNTER — Ambulatory Visit (INDEPENDENT_AMBULATORY_CARE_PROVIDER_SITE_OTHER): Payer: BLUE CROSS/BLUE SHIELD | Admitting: Family Medicine

## 2018-07-12 ENCOUNTER — Encounter: Payer: Self-pay | Admitting: Family Medicine

## 2018-07-12 VITALS — BP 120/71 | HR 60 | Temp 97.8°F | Ht 61.0 in | Wt 242.1 lb

## 2018-07-12 DIAGNOSIS — J111 Influenza due to unidentified influenza virus with other respiratory manifestations: Secondary | ICD-10-CM | POA: Diagnosis not present

## 2018-07-12 DIAGNOSIS — N926 Irregular menstruation, unspecified: Secondary | ICD-10-CM | POA: Diagnosis not present

## 2018-07-12 DIAGNOSIS — R52 Pain, unspecified: Secondary | ICD-10-CM | POA: Diagnosis not present

## 2018-07-12 LAB — VERITOR FLU A/B WAIVED
Influenza A: NEGATIVE
Influenza B: NEGATIVE

## 2018-07-12 LAB — PREGNANCY, URINE: Preg Test, Ur: NEGATIVE

## 2018-07-12 MED ORDER — OSELTAMIVIR PHOSPHATE 75 MG PO CAPS: 75.0000 mg | ORAL_CAPSULE | Freq: Two times a day (BID) | ORAL | 0 refills | Status: DC

## 2018-07-12 NOTE — Progress Notes (Signed)
Chief Complaint  Patient presents with  . Generalized Body Aches    HPI  Patient presents today for onset 3 days ago of nausea followed by body aches subjective fever sore throat and cough.  Felt a little better yesterday but rebounded today after trying to resume normal activities.  Patient also reports that her menses have been irregular and she is off of the birth control.  She is sexually active.  PMH: Smoking status noted ROS: Per HPI  Objective: BP 120/71   Pulse 60   Temp 97.8 F (36.6 C) (Oral)   Ht 5\' 1"  (1.549 m)   Wt 242 lb 2 oz (109.8 kg)   LMP 06/12/2018   BMI 45.75 kg/m  Gen: NAD, alert, cooperative with exam HEENT: NCAT, EOMI, PERRL CV: RRR, good S1/S2, no murmur Resp: CTABL, no wheezes, non-labored Abd: SNTND, BS present, no guarding or organomegaly Ext: No edema, warm Neuro: Alert and oriented, No gross deficits  Assessment and plan:  1. Body aches   2. Irregular menses     No orders of the defined types were placed in this encounter.   Orders Placed This Encounter  Procedures  . Veritor Flu A/B Waived    Order Specific Question:   Source    Answer:   nasal  . Pregnancy, urine    Follow up as needed.  Claretta Fraise, MD

## 2018-08-10 ENCOUNTER — Encounter: Payer: Self-pay | Admitting: Family Medicine

## 2018-08-10 ENCOUNTER — Ambulatory Visit (INDEPENDENT_AMBULATORY_CARE_PROVIDER_SITE_OTHER): Payer: BLUE CROSS/BLUE SHIELD | Admitting: Family Medicine

## 2018-08-10 VITALS — BP 128/73 | HR 84 | Temp 98.9°F | Ht 61.0 in | Wt 246.0 lb

## 2018-08-10 DIAGNOSIS — J069 Acute upper respiratory infection, unspecified: Secondary | ICD-10-CM

## 2018-08-10 MED ORDER — PSEUDOEPH-BROMPHEN-DM 30-2-10 MG/5ML PO SYRP: 5.0000 mL | ORAL_SOLUTION | Freq: Four times a day (QID) | ORAL | 0 refills | Status: DC | PRN

## 2018-08-10 NOTE — Progress Notes (Signed)
Subjective:     Mckenzie Cooley is a 28 y.o. female who presents for evaluation of symptoms of a URI. Symptoms include congestion, nasal congestion, no  fever, productive cough with  yellow and thin colored sputum and chills. Onset of symptoms was several days ago, and has been gradually worsening since that time. Treatment to date: decongestants and Tamiflu for recent influenza.  The following portions of the patient's history were reviewed and updated as appropriate: allergies, current medications, past family history, past medical history, past social history, past surgical history and problem list.  Review of Systems Constitutional: positive for chills and malaise Eyes: negative Ears, nose, mouth, throat, and face: positive for nasal congestion Respiratory: positive for cough and sputum Cardiovascular: negative Gastrointestinal: negative Musculoskeletal:negative Neurological: negative   Objective:    BP 128/73   Pulse 84   Temp 98.9 F (37.2 C) (Oral)   Ht 5\' 1"  (1.549 m)   Wt 246 lb (111.6 kg)   SpO2 95%   BMI 46.48 kg/m   General Appearance:    Alert, cooperative, no distress, appears stated age  Head:    Normocephalic, without obvious abnormality, atraumatic  Eyes:    PERRL, conjunctiva/corneas clear, EOM's intact, fundi    benign, both eyes  Ears:    Normal TM's and external ear canals, both ears  Nose:   Nares normal, septum midline, mucosa normal, no drainage    or sinus tenderness  Throat:   Lips, mucosa, and tongue normal; teeth and gums normal  Neck:   Supple, symmetrical, trachea midline, no adenopathy;    thyroid:  no enlargement/tenderness/nodules; no carotid   bruit or JVD  Back:     Symmetric, no curvature, ROM normal, no CVA tenderness  Lungs:     Clear to auscultation bilaterally, respirations unlabored  Chest Wall:    No tenderness or deformity   Heart:    Regular rate and rhythm, S1 and S2 normal, no murmur, rub   or gallop  Breast Exam:    No  tenderness, masses, or nipple abnormality  Abdomen:     Soft, non-tender, bowel sounds active all four quadrants,    no masses, no organomegaly  Genitalia:    Normal female without lesion, discharge or tenderness  Rectal:    Normal tone, normal prostate, no masses or tenderness;   guaiac negative stool  Extremities:   Extremities normal, atraumatic, no cyanosis or edema  Pulses:   2+ and symmetric all extremities  Skin:   Skin color, texture, turgor normal, no rashes or lesions  Lymph nodes:   Cervical, supraclavicular, and axillary nodes normal  Neurologic:   CNII-XII intact, normal strength, sensation and reflexes    throughout     Assessment:     Sidda was seen today for uri.  Diagnoses and all orders for this visit:  URI with cough and congestion Symptomatic care discussed. Increase fluid intake and humidity in the air. Report any new or worsening symptoms.  -     brompheniramine-pseudoephedrine-DM 30-2-10 MG/5ML syrup; Take 5 mLs by mouth 4 (four) times daily as needed.    Plan:    Discussed diagnosis and treatment of URI. Discussed the importance of avoiding unnecessary antibiotic therapy. Suggested symptomatic OTC remedies. Nasal saline spray for congestion. Follow up as needed. Call in 3 days if symptoms aren't resolving.    Return if symptoms worsen or fail to improve.  The above assessment and management plan was discussed with the patient. The patient  verbalized understanding of and has agreed to the management plan. Patient is aware to call the clinic if symptoms fail to improve or worsen. Patient is aware when to return to the clinic for a follow-up visit. Patient educated on when it is appropriate to go to the emergency department.   Monia Pouch, FNP-C [facility] [address] [place], [place] [address] [phone]

## 2018-08-10 NOTE — Patient Instructions (Signed)

## 2018-08-19 ENCOUNTER — Telehealth (INDEPENDENT_AMBULATORY_CARE_PROVIDER_SITE_OTHER): Payer: Self-pay | Admitting: Orthopaedic Surgery

## 2018-08-19 NOTE — Telephone Encounter (Signed)
Patient called stating she left two messages with Jackelyn Poling and one with our office to cancel her surgery.  Patient states she needs to wait until she receives a refund from her taxes before she can reschedule the surgery.  Patient states she did not receive a return call and wanted to make sure "we understood that she just couldn't afford the surgery at this time."  Please advise.  FYI:  I was unable to find any messages in her chart showing that she left messages with Jackelyn Poling or our office.

## 2018-08-19 NOTE — Telephone Encounter (Signed)
Please see below.

## 2018-08-22 NOTE — Telephone Encounter (Signed)
Patient left message at 11:51pm on Wednesday Feb 26th  on my voicemail cancelling her surgery at the [facility] for the following morning with a Surgery time of 7:30am.  Message was not retrieved until the following morning (after 8:30am).   Patient states she was called by the [facility] the morning of surgery and had attempted to cancel in advance days before, but was not able to speak with a staff member.  Patient also left a second message on my voicemail at 8:25am on Friday, Feb 28th (day after surgery). Patient would like to wait and schedule when she has received her tax refund because her out of pocket cost for the procedure is $632.

## 2018-08-24 ENCOUNTER — Inpatient Hospital Stay (INDEPENDENT_AMBULATORY_CARE_PROVIDER_SITE_OTHER): Payer: BLUE CROSS/BLUE SHIELD | Admitting: Orthopaedic Surgery

## 2018-09-15 ENCOUNTER — Other Ambulatory Visit: Payer: Self-pay | Admitting: Physician Assistant

## 2018-09-15 ENCOUNTER — Encounter: Payer: Self-pay | Admitting: Physician Assistant

## 2018-09-15 MED ORDER — PREDNISONE 10 MG (21) PO TBPK: ORAL_TABLET | ORAL | 0 refills | Status: DC

## 2018-09-27 ENCOUNTER — Telehealth: Payer: Self-pay | Admitting: Physician Assistant

## 2018-09-27 NOTE — Telephone Encounter (Signed)
Patient states she does not need tamiflu filled.

## 2018-12-06 ENCOUNTER — Other Ambulatory Visit: Payer: Self-pay | Admitting: Obstetrics & Gynecology

## 2018-12-14 ENCOUNTER — Ambulatory Visit (INDEPENDENT_AMBULATORY_CARE_PROVIDER_SITE_OTHER): Payer: BC Managed Care – PPO | Admitting: Adult Health

## 2018-12-14 ENCOUNTER — Other Ambulatory Visit: Payer: Self-pay

## 2018-12-14 ENCOUNTER — Encounter: Payer: Self-pay | Admitting: Adult Health

## 2018-12-14 VITALS — Ht 60.0 in

## 2018-12-14 DIAGNOSIS — O3680X0 Pregnancy with inconclusive fetal viability, not applicable or unspecified: Secondary | ICD-10-CM

## 2018-12-14 DIAGNOSIS — Z3201 Encounter for pregnancy test, result positive: Secondary | ICD-10-CM

## 2018-12-14 DIAGNOSIS — N920 Excessive and frequent menstruation with regular cycle: Secondary | ICD-10-CM | POA: Insufficient documentation

## 2018-12-14 HISTORY — DX: Encounter for pregnancy test, result positive: Z32.01

## 2018-12-14 NOTE — Progress Notes (Signed)
Patient ID: Mckenzie Cooley, female   DOB: [birth year], 28 y.o.   MRN: [id number]   TELEHEALTH VIRTUAL GYNECOLOGY VISIT ENCOUNTER NOTE  I connected with [name] on 12/14/18 at  8:45 AM EDT by telephone at home and verified that I am speaking with the correct person using two identifiers.   I discussed the limitations, risks, security and privacy concerns of performing an evaluation and management service by telephone and the availability of in person appointments. I also discussed with the patient that there may be a patient responsible charge related to this service. The patient expressed understanding and agreed to proceed.   History:  Mckenzie Cooley is a 28 y.o. G5P1031,white female.married, being evaluated today for having missed a period, and had 1+HPT, then 4 negative HPT and is spotting now with some abdominal and back pain and headache.She does want to be pregnant. She denies any abnormal vaginal discharge,  or other concerns.     PCP is Particia Nearing PA.    Past Medical History:  Diagnosis Date  . Acid reflux   . Anemia   . Bloating   . Childhood asthma   . History of blood transfusion   . PCOS (polycystic ovarian syndrome) 09/27/2014  . Preeclampsia   . Seasonal allergies    Past Surgical History:  Procedure Laterality Date  . CESAREAN SECTION N/A 01/02/2017   Procedure: CESAREAN SECTION;  Surgeon: Chancy Milroy, MD;  Location: [facility];  Service: Obstetrics;  Laterality: N/A;  . CHOLECYSTECTOMY  2011   Dr.Byerly  . ESOPHAGOGASTRODUODENOSCOPY N/A 10/26/2014   Procedure: ESOPHAGOGASTRODUODENOSCOPY (EGD);  Surgeon: Rogene Houston, MD;  [facility];  Service: Endoscopy;  Laterality: N/A;  . UPPER GASTROINTESTINAL ENDOSCOPY  12/05/2009  . UPPER GASTROINTESTINAL ENDOSCOPY  02/07/2009  . WISDOM TOOTH EXTRACTION    . WISDOM TOOTH EXTRACTION     The following portions of the patient's history were reviewed and updated as appropriate: allergies, current  medications, past family history, past medical history, past social history, past surgical history and problem list.   Health Maintenance:  Normal pap on 04/03/17.  Review of Systems:  Pertinent items noted in HPI and remainder of comprehensive ROS otherwise negative.  Physical Exam:   General:  Alert, oriented and cooperative.   Mental Status: Normal mood and affect perceived. Normal judgment and thought content.  Physical exam deferred due to nature of the encounter Ht 5' (1.524 m)   LMP 10/04/2018 (Approximate)   Breastfeeding No   BMI 48.04 kg/m  per pt.  Her blood type is A+, it is in [facility].   Labs and Imaging No results found for this or any previous visit (from the past 336 hour(s)). No results found.    Assessment and Plan:     1. Positive urine pregnancy test +HPT x 1 then 4 negative tests Will check QHCG today or tomorrow which ever she can do and will talk when results back, and treat self as pregnant, no drugs or alcohol  - Beta hCG quant (ref lab)  2. Spotting - Beta hCG quant (ref lab)       I discussed the assessment and treatment plan with the patient. The patient was provided an opportunity to ask questions and all were answered. The patient agreed with the plan and demonstrated an understanding of the instructions.   The patient was advised to call back or seek an in-person evaluation/go to the ED if the symptoms worsen or if the  condition fails to improve as anticipated.  I provided 5 minutes of non-face-to-face time during this encounter.   Derrek Monaco, NP [facility], [place]

## 2018-12-15 DIAGNOSIS — Z3201 Encounter for pregnancy test, result positive: Secondary | ICD-10-CM | POA: Diagnosis not present

## 2018-12-15 DIAGNOSIS — N920 Excessive and frequent menstruation with regular cycle: Secondary | ICD-10-CM | POA: Diagnosis not present

## 2018-12-16 LAB — BETA HCG QUANT (REF LAB): hCG Quant: <1 m[IU]/mL

## 2018-12-29 ENCOUNTER — Encounter: Payer: Self-pay | Admitting: Physician Assistant

## 2018-12-30 ENCOUNTER — Other Ambulatory Visit: Payer: Self-pay | Admitting: Physician Assistant

## 2018-12-30 MED ORDER — SULFAMETHOXAZOLE-TRIMETHOPRIM 800-160 MG PO TABS: 1.0000 | ORAL_TABLET | Freq: Two times a day (BID) | ORAL | 0 refills | Status: DC

## 2019-02-23 ENCOUNTER — Encounter: Payer: Self-pay | Admitting: Physician Assistant

## 2019-03-28 ENCOUNTER — Other Ambulatory Visit: Payer: Self-pay

## 2019-03-28 ENCOUNTER — Encounter: Payer: Self-pay | Admitting: Physician Assistant

## 2019-03-28 ENCOUNTER — Ambulatory Visit (INDEPENDENT_AMBULATORY_CARE_PROVIDER_SITE_OTHER): Payer: BC Managed Care – PPO | Admitting: Physician Assistant

## 2019-03-28 VITALS — BP 123/80 | HR 78 | Temp 99.3°F | Ht 60.0 in | Wt 236.2 lb

## 2019-03-28 DIAGNOSIS — N6012 Diffuse cystic mastopathy of left breast: Secondary | ICD-10-CM | POA: Diagnosis not present

## 2019-03-28 DIAGNOSIS — N6011 Diffuse cystic mastopathy of right breast: Secondary | ICD-10-CM | POA: Diagnosis not present

## 2019-03-28 DIAGNOSIS — N644 Mastodynia: Secondary | ICD-10-CM | POA: Diagnosis not present

## 2019-03-28 DIAGNOSIS — L309 Dermatitis, unspecified: Secondary | ICD-10-CM

## 2019-03-28 MED ORDER — CLOBETASOL PROPIONATE 0.05 % EX CREA: 1.0000 "application " | TOPICAL_CREAM | Freq: Two times a day (BID) | CUTANEOUS | 0 refills | Status: DC

## 2019-03-28 NOTE — Patient Instructions (Signed)
Fibrocystic Breast Changes  Fibrocystic breast changes are changes that can make your breasts swollen or painful. These changes happen when tiny sacs of fluid (cysts) form in the breast. This is a common condition. It does not mean that you have cancer. It usually happens because of hormone changes during a monthly period. Follow these instructions at home:  Check your breasts after every monthly period. If you do not have monthly periods, check your breasts on the first day of every month. Check for: ? Soreness. ? New swelling or puffiness. ? A change in breast size. ? A change in a lump that was already there.  Take over-the-counter and prescription medicines only as told by your doctor.  Wear a support or sports bra that fits well. Wear this support especially when you are exercising.  Avoid or have less caffeine, fat, and sugar in what you eat and drink as told by your doctor. Contact a doctor if:  You have fluid coming from your nipple, especially if the fluid has blood in it.  You have new lumps or bumps in your breast.  Your breast gets puffy, red, and painful.  You have changes in how your breast looks.  Your nipple looks flat or it sinks into your breast. Get help right away if:  Your breast turns red, and the redness is spreading. Summary  Fibrocystic breast changes are changes that can make your breasts swollen or painful.  This condition can happen when you have hormone changes during your monthly period.  With this condition, it is important to check your breasts after every monthly period. If you do not have monthly periods, check your breasts on the first day of every month. This information is not intended to replace advice given to you by your health care provider. Make sure you discuss any questions you have with your health care provider. Document Released: 05/21/2008 Document Revised: 09/29/2018 Document Reviewed: 02/20/2016 Elsevier Patient Education  2020  Elsevier Inc.  

## 2019-03-29 LAB — CMP14+EGFR
ALT: 22 IU/L (ref 0–32)
AST: 21 IU/L (ref 0–40)
Albumin/Globulin Ratio: 1.3 (ref 1.2–2.2)
Albumin: 4.1 g/dL (ref 3.9–5.0)
Alkaline Phosphatase: 91 IU/L (ref 39–117)
BUN/Creatinine Ratio: 13 (ref 9–23)
BUN: 9 mg/dL (ref 6–20)
Bilirubin Total: 0.4 mg/dL (ref 0.0–1.2)
CO2: 24 mmol/L (ref 20–29)
Calcium: 9.2 mg/dL (ref 8.7–10.2)
Chloride: 106 mmol/L (ref 96–106)
Creatinine, Ser: 0.69 mg/dL (ref 0.57–1.00)
GFR calc Af Amer: 137 mL/min/{1.73_m2} (ref >59)
GFR calc non Af Amer: 119 mL/min/{1.73_m2} (ref >59)
Globulin, Total: 3.1 g/dL (ref 1.5–4.5)
Glucose: 110 mg/dL — ABNORMAL HIGH (ref 65–99)
Potassium: 4.3 mmol/L (ref 3.5–5.2)
Sodium: 142 mmol/L (ref 134–144)
Total Protein: 7.2 g/dL (ref 6.0–8.5)

## 2019-03-29 LAB — TSH: TSH: 1.64 u[IU]/mL (ref 0.450–4.500)

## 2019-03-29 LAB — FSH/LH
FSH: 6 m[IU]/mL
LH: 8.1 m[IU]/mL

## 2019-03-29 LAB — PROLACTIN: Prolactin: 13.7 ng/mL (ref 4.8–23.3)

## 2019-03-29 NOTE — Progress Notes (Signed)
 BP 123/80   Pulse 78   Temp 99.3 F (37.4 C) (Temporal)   Ht 5' (1.524 m)   Wt 236 lb 3.2 oz (107.1 kg)   LMP 02/26/2019   SpO2 98%   Breastfeeding Unknown   BMI 46.13 kg/m    Subjective:    Patient ID: Mckenzie Cooley, female    DOB: 09/19/1990, 28 y.o.   MRN: [id number]  HPI: Chosen R Hakala is a 28 y.o. female presenting on 03/28/2019 for Breast Pain  The patient reports that she has been having breast pain for about a month and a half that is quite severe.  It will hurt throughout the day does not matter if she is around her cycle or not.  She typically does have a little bit of a spotty.  But is usually on time.  Last time it was 4 days.  The pain causes her to wake up at night when she rolls over.  She has not had any new changes there is no discharge.  Past Medical History:  Diagnosis Date  . Acid reflux   . Anemia   . Bloating   . Childhood asthma   . History of blood transfusion   . PCOS (polycystic ovarian syndrome) 09/27/2014  . Preeclampsia   . Seasonal allergies    Relevant past medical, surgical, family and social history reviewed and updated as indicated. Interim medical history since our last visit reviewed. Allergies and medications reviewed and updated. DATA REVIEWED: CHART IN EPIC  Family History reviewed for pertinent findings.  Review of Systems  Constitutional: Negative.   HENT: Negative.   Eyes: Negative.   Respiratory: Negative.   Gastrointestinal: Negative.   Genitourinary: Negative.     Allergies as of 03/28/2019      Reactions   Percocet [oxycodone-acetaminophen] Hives, Itching   Amoxicillin Rash   Has patient had a PCN reaction causing immediate rash, facial/tongue/throat swelling, SOB or lightheadedness with hypotension: Yes -mild rash Has patient had a PCN reaction causing severe rash involving mucus membranes or skin necrosis: No Has patient had a PCN reaction that required hospitalization: No Has patient had a PCN reaction occurring  within the last 10 years: Yes If all of the above answers are "NO", then may proceed with Cephalosporin use. Has tolerated ceftriaxone & cephalexin       Medication List       Accurate as of March 28, 2019 11:59 PM. If you have any questions, ask your nurse or doctor.        STOP taking these medications   sulfamethoxazole-trimethoprim 800-160 MG tablet Commonly known as: Bactrim DS Stopped by: Angel S Jones, PA-C     TAKE these medications   acetaminophen 500 MG tablet Commonly known as: TYLENOL Take 1,000 mg by mouth daily as needed.   clobetasol cream 0.05 % Commonly known as: TEMOVATE Apply 1 application topically 2 (two) times daily. Started by: Angel S Jones, PA-C   pantoprazole 40 MG tablet Commonly known as: PROTONIX Take 1 tablet by mouth once daily          Objective:    BP 123/80   Pulse 78   Temp 99.3 F (37.4 C) (Temporal)   Ht 5' (1.524 m)   Wt 236 lb 3.2 oz (107.1 kg)   LMP 02/26/2019   SpO2 98%   Breastfeeding Unknown   BMI 46.13 kg/m   Allergies  Allergen Reactions  . Percocet [Oxycodone-Acetaminophen] Hives and Itching  . Amoxicillin Rash      Has patient had a PCN reaction causing immediate rash, facial/tongue/throat swelling, SOB or lightheadedness with hypotension: Yes -mild rash Has patient had a PCN reaction causing severe rash involving mucus membranes or skin necrosis: No Has patient had a PCN reaction that required hospitalization: No Has patient had a PCN reaction occurring within the last 10 years: Yes If all of the above answers are "NO", then may proceed with Cephalosporin use. Has tolerated ceftriaxone & cephalexin     Wt Readings from Last 3 Encounters:  03/28/19 236 lb 3.2 oz (107.1 kg)  08/10/18 246 lb (111.6 kg)  07/12/18 242 lb 2 oz (109.8 kg)    Physical Exam Constitutional:      General: She is not in acute distress.    Appearance: Normal appearance. She is well-developed.  HENT:     Head: Normocephalic and  atraumatic.  Cardiovascular:     Rate and Rhythm: Normal rate.  Pulmonary:     Effort: Pulmonary effort is normal.  Chest:     Comments: Breasts are quite large and pendulous.  The right breast has small tan slightly raised lesions.  There are multiple fibrocystic throughout the breast.  Some up to 3 cm. Skin:    General: Skin is warm and dry.     Findings: No rash.  Neurological:     Mental Status: She is alert and oriented to person, place, and time.     Deep Tendon Reflexes: Reflexes are normal and symmetric.     Results for orders placed or performed in visit on 03/28/19  CMP14+EGFR  Result Value Ref Range   Glucose 110 (H) 65 - 99 mg/dL   BUN 9 6 - 20 mg/dL   Creatinine, Ser 0.69 0.57 - 1.00 mg/dL   GFR calc non Af Amer 119 >59 mL/min/1.73   GFR calc Af Amer 137 >59 mL/min/1.73   BUN/Creatinine Ratio 13 9 - 23   Sodium 142 134 - 144 mmol/L   Potassium 4.3 3.5 - 5.2 mmol/L   Chloride 106 96 - 106 mmol/L   CO2 24 20 - 29 mmol/L   Calcium 9.2 8.7 - 10.2 mg/dL   Total Protein 7.2 6.0 - 8.5 g/dL   Albumin 4.1 3.9 - 5.0 g/dL   Globulin, Total 3.1 1.5 - 4.5 g/dL   Albumin/Globulin Ratio 1.3 1.2 - 2.2   Bilirubin Total 0.4 0.0 - 1.2 mg/dL   Alkaline Phosphatase 91 39 - 117 IU/L   AST 21 0 - 40 IU/L   ALT 22 0 - 32 IU/L  TSH  Result Value Ref Range   TSH 1.640 0.450 - 4.500 uIU/mL  FSH/LH  Result Value Ref Range   LH 8.1 mIU/mL   FSH 6.0 mIU/mL  Prolactin  Result Value Ref Range   Prolactin 13.7 4.8 - 23.3 ng/mL      Assessment & Plan:   1. Breast pain in female - CMP14+EGFR - TSH - FSH/LH - Prolactin  2. Eczema, unspecified type - clobetasol cream (TEMOVATE) 0.05 %; Apply 1 application topically 2 (two) times daily.  Dispense: 30 g; Refill: 0  3. Fibrocystic breast changes of both breasts - CMP14+EGFR - TSH - FSH/LH - Prolactin  Breast ultrasound and diagnostic mammogram are ordered for bilateral complaints  Continue all other maintenance medications  as listed above.  Follow up plan: No follow-ups on file.  Educational handout given for fibrocystic breast changes  Terald Sleeper PA-C [facility] [address]  [place], [place] [address]  [phone]   03/29/2019, 5:58 PM

## 2019-04-05 ENCOUNTER — Telehealth: Payer: Self-pay

## 2019-04-05 NOTE — Telephone Encounter (Signed)
Patient notified that appointment made for bilat diagnostic mammogram and bilat breast ultrasound at [facility] on Nov 3rd 920 am and patient to arrive at 9 am and is to avoid any lotion or powder around or on the breast area. MPulliam, CMA/RT(R)

## 2019-04-24 ENCOUNTER — Telehealth: Payer: Self-pay | Admitting: Physician Assistant

## 2019-04-24 ENCOUNTER — Ambulatory Visit (INDEPENDENT_AMBULATORY_CARE_PROVIDER_SITE_OTHER): Payer: BC Managed Care – PPO | Admitting: Family Medicine

## 2019-04-24 ENCOUNTER — Encounter: Payer: Self-pay | Admitting: Family Medicine

## 2019-04-24 DIAGNOSIS — J0100 Acute maxillary sinusitis, unspecified: Secondary | ICD-10-CM | POA: Diagnosis not present

## 2019-04-24 MED ORDER — LEVOFLOXACIN 500 MG PO TABS: 500.0000 mg | ORAL_TABLET | Freq: Every day | ORAL | 0 refills | Status: DC

## 2019-04-24 MED ORDER — PSEUDOEPHEDRINE-GUAIFENESIN ER 120-1200 MG PO TB12: 1.0000 | ORAL_TABLET | Freq: Two times a day (BID) | ORAL | 0 refills | Status: DC

## 2019-04-24 NOTE — Progress Notes (Signed)
Subjective:    Patient ID: Mckenzie Cooley, female    DOB: 1991-06-01, 28 y.o.   MRN: [id number]   HPI: Mckenzie Cooley is a 28 y.o. female presenting for Symptoms include congestion, facial pain, nasal congestion, non productive cough, post nasal drip and sinus pressure. There is no fever, chills, or sweats. Onset of symptoms was  Three days ago, gradually worsening since that time. Mucous is green or yellow. Denies dyspnea. Denies body aches.    Depression screen [facility] 2/9 03/28/2019 08/10/2018 04/11/2018 01/07/2018 12/08/2017  Decreased Interest 0 0 0 1 0  Down, Depressed, Hopeless 0 0 0 1 0  PHQ - 2 Score 0 0 0 2 0  Altered sleeping - - - 0 -  Tired, decreased energy - - - 3 -  Change in appetite - - - 3 -  Feeling bad or failure about yourself  - - - 3 -  Trouble concentrating - - - 1 -  Moving slowly or fidgety/restless - - - 3 -  Suicidal thoughts - - - 0 -  PHQ-9 Score - - - 15 -  Difficult doing work/chores - - - Not difficult at all -     Relevant past medical, surgical, family and social history reviewed and updated as indicated.  Interim medical history since our last visit reviewed. Allergies and medications reviewed and updated.  ROS:  Review of Systems  Constitutional: Negative for activity change, appetite change, chills and fever.  HENT: Positive for congestion, postnasal drip, rhinorrhea and sinus pressure. Negative for ear discharge, ear pain, hearing loss, nosebleeds, sneezing and trouble swallowing.   Respiratory: Negative for chest tightness and shortness of breath.   Cardiovascular: Negative for chest pain and palpitations.  Skin: Negative for rash.     Social History   Tobacco Use  Smoking Status Former Smoker  . Packs/day: 0.50  . Years: 12.00  . Pack years: 6.00  . Types: Cigarettes  . Quit date: 05/05/2016  . Years since quitting: 2.9  Smokeless Tobacco Never Used       Objective:     Wt Readings from Last 3 Encounters:  03/28/19 236 lb  3.2 oz (107.1 kg)  08/10/18 246 lb (111.6 kg)  07/12/18 242 lb 2 oz (109.8 kg)     Exam deferred. Pt. Harboring due to COVID 19. Phone visit performed.   Assessment & Plan:   1. Acute maxillary sinusitis, recurrence not specified     Meds ordered this encounter  Medications  . levofloxacin (LEVAQUIN) 500 MG tablet    Sig: Take 1 tablet (500 mg total) by mouth daily. For 10 days    Dispense:  10 tablet    Refill:  0  . Pseudoephedrine-Guaifenesin [phone] MG TB12    Sig: Take 1 tablet by mouth 2 (two) times daily. For congestion    Dispense:  20 tablet    Refill:  0    No orders of the defined types were placed in this encounter.     Diagnoses and all orders for this visit:  Acute maxillary sinusitis, recurrence not specified  Other orders -     levofloxacin (LEVAQUIN) 500 MG tablet; Take 1 tablet (500 mg total) by mouth daily. For 10 days -     Pseudoephedrine-Guaifenesin [phone] MG TB12; Take 1 tablet by mouth 2 (two) times daily. For congestion    Virtual Visit via telephone Note  I discussed the limitations, risks, security and privacy concerns of performing an evaluation  and management service by telephone and the availability of in person appointments. The patient was identified with two identifiers. Pt.expressed understanding and agreed to proceed. Pt. Is at home. Dr. Livia Snellen is in his office.  Follow Up Instructions:   I discussed the assessment and treatment plan with the patient. The patient was provided an opportunity to ask questions and all were answered. The patient agreed with the plan and demonstrated an understanding of the instructions.   The patient was advised to call back or seek an in-person evaluation if the symptoms worsen or if the condition fails to improve as anticipated.   Total minutes including chart review and phone contact time: 11   Follow up plan: Return if symptoms worsen or fail to improve.  Claretta Fraise, MD [place]

## 2019-04-25 ENCOUNTER — Encounter (HOSPITAL_COMMUNITY): Payer: BC Managed Care – PPO

## 2019-04-25 ENCOUNTER — Other Ambulatory Visit: Payer: Self-pay | Admitting: Physician Assistant

## 2019-04-25 ENCOUNTER — Ambulatory Visit (HOSPITAL_COMMUNITY)
Admission: RE | Admit: 2019-04-25 | Discharge: 2019-04-25 | Disposition: A | Discharge status: Home / Self Care | Payer: BC Managed Care – PPO | Source: Ambulatory Visit | Attending: Physician Assistant | Admitting: Physician Assistant

## 2019-04-25 ENCOUNTER — Other Ambulatory Visit: Payer: Self-pay

## 2019-04-25 DIAGNOSIS — N644 Mastodynia: Secondary | ICD-10-CM

## 2019-04-25 DIAGNOSIS — N6011 Diffuse cystic mastopathy of right breast: Secondary | ICD-10-CM

## 2019-04-25 DIAGNOSIS — N6012 Diffuse cystic mastopathy of left breast: Secondary | ICD-10-CM

## 2019-05-03 ENCOUNTER — Encounter: Payer: Self-pay | Admitting: *Deleted

## 2019-05-03 ENCOUNTER — Telehealth: Payer: Self-pay | Admitting: *Deleted

## 2019-05-03 NOTE — Telephone Encounter (Signed)
Patient states she has been bleeding for going on 17 days now.  She is beginning to feel weak and drained. States she has not had a regular period at all this year since stopping her BCP as she and her husband are trying to conceive.  Advised patient to make appt for eval.  Verbalized understanding and requested to see Dr Elonda Husky.  Appt made for tomorrow.

## 2019-05-03 NOTE — Telephone Encounter (Signed)
Pt left message that she has been on her period for 16 days. This is not normal for her. Wants to know what to do.

## 2019-05-04 ENCOUNTER — Ambulatory Visit (INDEPENDENT_AMBULATORY_CARE_PROVIDER_SITE_OTHER): Payer: BC Managed Care – PPO | Admitting: Obstetrics & Gynecology

## 2019-05-04 ENCOUNTER — Other Ambulatory Visit: Payer: Self-pay

## 2019-05-04 ENCOUNTER — Encounter: Payer: Self-pay | Admitting: Obstetrics & Gynecology

## 2019-05-04 VITALS — BP 127/77 | Ht 60.0 in | Wt 235.0 lb

## 2019-05-04 DIAGNOSIS — N938 Other specified abnormal uterine and vaginal bleeding: Secondary | ICD-10-CM | POA: Diagnosis not present

## 2019-05-04 MED ORDER — MEGESTROL ACETATE 40 MG PO TABS: ORAL_TABLET | ORAL | 3 refills | Status: DC

## 2019-05-04 NOTE — Progress Notes (Signed)
Chief Complaint  Patient presents with  . Vaginal Bleeding    x17 days      28 y.o. [id number] Patient's last menstrual period was 04/17/2019. The current method of family planning is none.  Outpatient Encounter Medications as of 05/04/2019  Medication Sig  . acetaminophen (TYLENOL) 500 MG tablet Take 1,000 mg by mouth daily as needed.  . pantoprazole (PROTONIX) 40 MG tablet Take 1 tablet by mouth once daily  . clobetasol cream (TEMOVATE) 5.02 % Apply 1 application topically 2 (two) times daily. (Patient not taking: Reported on 05/04/2019)  . levofloxacin (LEVAQUIN) 500 MG tablet Take 1 tablet (500 mg total) by mouth daily. For 10 days (Patient not taking: Reported on 05/04/2019)  . megestrol (MEGACE) 40 MG tablet 3 tablets a day for 5 days, 2 tablets a day for 5 days then 1 tablet daily  . Pseudoephedrine-Guaifenesin [phone] MG TB12 Take 1 tablet by mouth 2 (two) times daily. For congestion (Patient not taking: Reported on 05/04/2019)   No facility-administered encounter medications on file as of 05/04/2019.     Subjective Bleeding x 17 days dys synchronous type bleeding, variable volume and type Some cramping Has hx of irregular menses, lifelong, PCOS type hisotry although recent labs do not point to that   Past Medical History:  Diagnosis Date  . Acid reflux   . Anemia   . Bloating   . Childhood asthma   . History of blood transfusion   . PCOS (polycystic ovarian syndrome) 09/27/2014  . Preeclampsia   . Seasonal allergies     Past Surgical History:  Procedure Laterality Date  . CESAREAN SECTION N/A 01/02/2017   Procedure: CESAREAN SECTION;  Surgeon: Chancy Milroy, MD;  Location: [place];  Service: Obstetrics;  Laterality: N/A;  . CHOLECYSTECTOMY  2011   Dr.Byerly  . ESOPHAGOGASTRODUODENOSCOPY N/A 10/26/2014   Procedure: ESOPHAGOGASTRODUODENOSCOPY (EGD);  Surgeon: Rogene Houston, MD;  [facility];  Service: Endoscopy;  Laterality: N/A;   . UPPER GASTROINTESTINAL ENDOSCOPY  12/05/2009  . UPPER GASTROINTESTINAL ENDOSCOPY  02/07/2009  . WISDOM TOOTH EXTRACTION    . WISDOM TOOTH EXTRACTION      OB History    Gravida  5   Para  1   Term  1   Preterm      AB  3   Living  1     SAB  3   TAB  0   Ectopic  0   Multiple  0   Live Births  1           Allergies  Allergen Reactions  . Percocet [Oxycodone-Acetaminophen] Hives and Itching  . Amoxicillin Rash    Has patient had a PCN reaction causing immediate rash, facial/tongue/throat swelling, SOB or lightheadedness with hypotension: Yes -mild rash Has patient had a PCN reaction causing severe rash involving mucus membranes or skin necrosis: No Has patient had a PCN reaction that required hospitalization: No Has patient had a PCN reaction occurring within the last 10 years: Yes If all of the above answers are "NO", then may proceed with Cephalosporin use. Has tolerated ceftriaxone & cephalexin     Social History   Socioeconomic History  . Marital status: Married    Spouse name: Not on file  . Number of children: 1  . Years of education: Not on file  . Highest education level: Not on file  Occupational History  . Not on file  Social Needs  .  Financial resource strain: Not on file  . Food insecurity    Worry: Not on file    Inability: Not on file  . Transportation needs    Medical: Not on file    Non-medical: Not on file  Tobacco Use  . Smoking status: Former Smoker    Packs/day: 0.50    Years: 12.00    Pack years: 6.00    Types: Cigarettes    Quit date: 05/05/2016    Years since quitting: 2.9  . Smokeless tobacco: Never Used  Substance and Sexual Activity  . Alcohol use: No    Alcohol/week: 0.0 standard drinks  . Drug use: No  . Sexual activity: Yes    Birth control/protection: None  Lifestyle  . Physical activity    Days per week: Not on file    Minutes per session: Not on file  . Stress: Not on file  Relationships  . Social  Herbalist on phone: Not on file    Gets together: Not on file    Attends religious service: Not on file    Active member of club or organization: Not on file    Attends meetings of clubs or organizations: Not on file    Relationship status: Not on file  Other Topics Concern  . Not on file  Social History Narrative  . Not on file    Family History  Problem Relation Age of Onset  . Hypertension Mother   . Heart disease Father   . Cancer - Cervical Maternal Aunt   . Kidney disease Maternal Grandmother   . Renal Disease Maternal Grandmother     Medications:       Current Outpatient Medications:  .  acetaminophen (TYLENOL) 500 MG tablet, Take 1,000 mg by mouth daily as needed., Disp: , Rfl:  .  pantoprazole (PROTONIX) 40 MG tablet, Take 1 tablet by mouth once daily, Disp: 30 tablet, Rfl: 5 .  clobetasol cream (TEMOVATE) 2.54 %, Apply 1 application topically 2 (two) times daily. (Patient not taking: Reported on 05/04/2019), Disp: 30 g, Rfl: 0 .  levofloxacin (LEVAQUIN) 500 MG tablet, Take 1 tablet (500 mg total) by mouth daily. For 10 days (Patient not taking: Reported on 05/04/2019), Disp: 10 tablet, Rfl: 0 .  megestrol (MEGACE) 40 MG tablet, 3 tablets a day for 5 days, 2 tablets a day for 5 days then 1 tablet daily, Disp: 45 tablet, Rfl: 3 .  Pseudoephedrine-Guaifenesin [id number] MG TB12, Take 1 tablet by mouth 2 (two) times daily. For congestion (Patient not taking: Reported on 05/04/2019), Disp: 20 tablet, Rfl: 0  Objective Blood pressure 127/77, height 5' (1.524 m), weight 235 lb (106.6 kg), last menstrual period 04/17/2019, unknown if currently breastfeeding.  General WDWN female NAD Vulva:  normal appearing vulva with no masses, tenderness or lesions Vagina:  normal mucosa, no discharge Cervix:  Normal no lesions Uterus:  normal size, contour, position, consistency, mobility, non-tender Adnexa: ovaries:present,  normal adnexa in size, nontender and no masses    Pertinent ROS No burning with urination, frequency or urgency No nausea, vomiting or diarrhea Nor fever chills or other constitutional symptoms   Labs or studies Recent Results (from the past 2160 hour(s))  CMP14+EGFR     Status: Abnormal   Collection Time: 03/28/19  2:33 PM  Result Value Ref Range   Glucose 110 (H) 65 - 99 mg/dL   BUN 9 6 - 20 mg/dL   Creatinine, Ser 0.69 0.57 - 1.00 mg/dL  GFR calc non Af Amer 119 >59 mL/min/1.73   GFR calc Af Amer 137 >59 mL/min/1.73   BUN/Creatinine Ratio 13 9 - 23   Sodium 142 134 - 144 mmol/L   Potassium 4.3 3.5 - 5.2 mmol/L   Chloride 106 96 - 106 mmol/L   CO2 24 20 - 29 mmol/L   Calcium 9.2 8.7 - 10.2 mg/dL   Total Protein 7.2 6.0 - 8.5 g/dL   Albumin 4.1 3.9 - 5.0 g/dL   Globulin, Total 3.1 1.5 - 4.5 g/dL   Albumin/Globulin Ratio 1.3 1.2 - 2.2   Bilirubin Total 0.4 0.0 - 1.2 mg/dL   Alkaline Phosphatase 91 39 - 117 IU/L   AST 21 0 - 40 IU/L   ALT 22 0 - 32 IU/L  TSH     Status: None   Collection Time: 03/28/19  2:33 PM  Result Value Ref Range   TSH 1.640 0.450 - 4.500 uIU/mL  FSH/LH     Status: None   Collection Time: 03/28/19  2:33 PM  Result Value Ref Range   LH 8.1 mIU/mL    Comment:                     Adult Female:                       Follicular phase      2.4 -  12.6                       Ovulation phase      14.0 -  95.6                       Luteal phase          1.0 -  11.4                       Postmenopausal        7.7 -  58.5    FSH 6.0 mIU/mL    Comment:                     Adult Female:                       Follicular phase      3.5 -  12.5                       Ovulation phase       4.7 -  21.5                       Luteal phase          1.7 -   7.7                       Postmenopausal       25.8 - 134.8   Prolactin     Status: None   Collection Time: 03/28/19  2:33 PM  Result Value Ref Range   Prolactin 13.7 4.8 - 23.3 ng/mL      Impression Diagnoses this Encounter::   ICD-10-CM   1. DUB  (dysfunctional uterine bleeding):dys synchronous type  N93.8     Established relevant diagnosis(es):   Plan/Recommendations: Meds ordered this encounter  Medications  . megestrol (MEGACE) 40 MG tablet  Sig: 3 tablets a day for 5 days, 2 tablets a day for 5 days then 1 tablet daily    Dispense:  45 tablet    Refill:  3    Labs or Scans Ordered: No orders of the defined types were placed in this encounter.   Management:: >megestrol algorithm then hopefully the re synched endometrium will normalize  Follow up Return if symptoms worsen or fail to improve.       All questions were answered.

## 2019-05-15 ENCOUNTER — Encounter: Payer: Self-pay | Admitting: Physician Assistant

## 2019-05-16 ENCOUNTER — Other Ambulatory Visit: Payer: Self-pay | Admitting: *Deleted

## 2019-05-16 DIAGNOSIS — Z20822 Contact with and (suspected) exposure to covid-19: Secondary | ICD-10-CM

## 2019-05-18 LAB — NOVEL CORONAVIRUS, NAA: SARS-CoV-2, NAA: NOT DETECTED

## 2019-06-05 ENCOUNTER — Ambulatory Visit: Payer: BC Managed Care – PPO | Admitting: Obstetrics & Gynecology

## 2019-06-09 ENCOUNTER — Telehealth: Payer: Self-pay | Admitting: Obstetrics & Gynecology

## 2019-06-09 MED ORDER — DESOGESTREL-ETHINYL ESTRADIOL 0.15-30 MG-MCG PO TABS: 1.0000 | ORAL_TABLET | Freq: Every day | ORAL | 11 refills | Status: DC

## 2019-06-21 ENCOUNTER — Encounter: Payer: Self-pay | Admitting: Physician Assistant

## 2020-01-23 DIAGNOSIS — Z20822 Contact with and (suspected) exposure to covid-19: Secondary | ICD-10-CM | POA: Diagnosis not present

## 2020-02-02 DIAGNOSIS — Z20822 Contact with and (suspected) exposure to covid-19: Secondary | ICD-10-CM | POA: Diagnosis not present

## 2020-02-11 DIAGNOSIS — Z20822 Contact with and (suspected) exposure to covid-19: Secondary | ICD-10-CM | POA: Diagnosis not present

## 2020-02-13 DIAGNOSIS — Z1159 Encounter for screening for other viral diseases: Secondary | ICD-10-CM | POA: Diagnosis not present

## 2020-04-20 DIAGNOSIS — Z20822 Contact with and (suspected) exposure to covid-19: Secondary | ICD-10-CM | POA: Diagnosis not present

## 2020-06-03 DIAGNOSIS — Z20822 Contact with and (suspected) exposure to covid-19: Secondary | ICD-10-CM | POA: Diagnosis not present

## 2020-07-18 DIAGNOSIS — Z20822 Contact with and (suspected) exposure to covid-19: Secondary | ICD-10-CM | POA: Diagnosis not present

## 2020-07-22 DIAGNOSIS — Z20822 Contact with and (suspected) exposure to covid-19: Secondary | ICD-10-CM | POA: Diagnosis not present

## 2020-07-26 ENCOUNTER — Ambulatory Visit (INDEPENDENT_AMBULATORY_CARE_PROVIDER_SITE_OTHER): Payer: Medicaid Other | Admitting: Family Medicine

## 2020-07-26 ENCOUNTER — Encounter: Payer: Self-pay | Admitting: Family Medicine

## 2020-07-26 VITALS — BP 117/76 | HR 64 | Temp 98.4°F

## 2020-07-26 DIAGNOSIS — J069 Acute upper respiratory infection, unspecified: Secondary | ICD-10-CM

## 2020-07-26 DIAGNOSIS — R059 Cough, unspecified: Secondary | ICD-10-CM | POA: Diagnosis not present

## 2020-07-26 DIAGNOSIS — J029 Acute pharyngitis, unspecified: Secondary | ICD-10-CM

## 2020-07-26 DIAGNOSIS — R52 Pain, unspecified: Secondary | ICD-10-CM | POA: Diagnosis not present

## 2020-07-26 LAB — CULTURE, GROUP A STREP

## 2020-07-26 LAB — RAPID STREP SCREEN (MED CTR MEBANE ONLY): Strep Gp A Ag, IA W/Reflex: NEGATIVE

## 2020-07-26 MED ORDER — CHLORPHEN-PE-ACETAMINOPHEN 4-10-325 MG PO TABS: 1.0000 | ORAL_TABLET | Freq: Four times a day (QID) | ORAL | 0 refills | Status: DC | PRN

## 2020-07-26 MED ORDER — BENZONATATE 100 MG PO CAPS: 100.0000 mg | ORAL_CAPSULE | Freq: Three times a day (TID) | ORAL | 0 refills | Status: DC | PRN

## 2020-07-26 NOTE — Progress Notes (Signed)
Established Patient Office Visit  Subjective:  Patient ID: Mckenzie Cooley, female    DOB: [birth year]  Age: 30 y.o. MRN: [id number]  CC:  Chief Complaint  Patient presents with  . Sore Throat    Cough, congestion, headache, body aches since 07/20/20    HPI [name] presents for sore throat x 5 days. She also reports cough, congestion, headache, chills, and body aches. She was tested for flu and Covid at [facility] on Monday and was negative. She has not had a fever. She feels better overall other than her throat. She feels like her throat is swollen. Her throat feels very dry at night and it is painful to swallow. She has tried mucinex, tylenol, and throat lozenges with some relief. She denies shortness of breath or chest pain. She has not been vaccinated. Her husband has Covid.   Past Medical History:  Diagnosis Date  . Acid reflux   . Anemia   . Bloating   . Childhood asthma   . History of blood transfusion   . PCOS (polycystic ovarian syndrome) 09/27/2014  . Preeclampsia   . Seasonal allergies     Past Surgical History:  Procedure Laterality Date  . CESAREAN SECTION N/A 01/02/2017   Procedure: CESAREAN SECTION;  Surgeon: Chancy Milroy, MD;  Location: [place];  Service: Obstetrics;  Laterality: N/A;  . CHOLECYSTECTOMY  2011   Dr.Byerly  . ESOPHAGOGASTRODUODENOSCOPY N/A 10/26/2014   Procedure: ESOPHAGOGASTRODUODENOSCOPY (EGD);  Surgeon: Rogene Houston, MD;  [facility];  Service: Endoscopy;  Laterality: N/A;  . UPPER GASTROINTESTINAL ENDOSCOPY  12/05/2009  . UPPER GASTROINTESTINAL ENDOSCOPY  02/07/2009  . WISDOM TOOTH EXTRACTION    . WISDOM TOOTH EXTRACTION      Family History  Problem Relation Age of Onset  . Hypertension Mother   . Heart disease Father   . Cancer - Cervical Maternal Aunt   . Kidney disease Maternal Grandmother   . Renal Disease Maternal Grandmother     Social History   Socioeconomic History  . Marital status: Married     Spouse name: Not on file  . Number of children: 1  . Years of education: Not on file  . Highest education level: Not on file  Occupational History  . Not on file  Tobacco Use  . Smoking status: Former Smoker    Packs/day: 0.50    Years: 12.00    Pack years: 6.00    Types: Cigarettes    Quit date: 05/05/2016    Years since quitting: 4.2  . Smokeless tobacco: Never Used  Vaping Use  . Vaping Use: Never used  Substance and Sexual Activity  . Alcohol use: No    Alcohol/week: 0.0 standard drinks  . Drug use: No  . Sexual activity: Yes    Birth control/protection: None  Other Topics Concern  . Not on file  Social History Narrative  . Not on file   Social Determinants of Health   Financial Resource Strain: Not on file  Food Insecurity: Not on file  Transportation Needs: Not on file  Physical Activity: Not on file  Stress: Not on file  Social Connections: Not on file  Intimate Partner Violence: Not on file    Outpatient Medications Prior to Visit  Medication Sig Dispense Refill  . acetaminophen (TYLENOL) 500 MG tablet Take 1,000 mg by mouth daily as needed.    . clobetasol cream (TEMOVATE) [id number] % Apply 1 application topically 2 (two) times daily. (Patient  not taking: Reported on 05/04/2019) 30 g 0  . desogestrel-ethinyl estradiol (APRI) 0.15-30 MG-MCG tablet Take 1 tablet by mouth daily. 1 Package 11  . levofloxacin (LEVAQUIN) 500 MG tablet Take 1 tablet (500 mg total) by mouth daily. For 10 days (Patient not taking: Reported on 05/04/2019) 10 tablet 0  . megestrol (MEGACE) 40 MG tablet 3 tablets a day for 5 days, 2 tablets a day for 5 days then 1 tablet daily 45 tablet 3  . pantoprazole (PROTONIX) 40 MG tablet Take 1 tablet by mouth once daily 30 tablet 5  . Pseudoephedrine-Guaifenesin [phone] MG TB12 Take 1 tablet by mouth 2 (two) times daily. For congestion (Patient not taking: Reported on 05/04/2019) 20 tablet 0   No facility-administered medications prior to visit.     Allergies  Allergen Reactions  . Percocet [Oxycodone-Acetaminophen] Hives and Itching  . Amoxicillin Rash    Has patient had a PCN reaction causing immediate rash, facial/tongue/throat swelling, SOB or lightheadedness with hypotension: Yes -mild rash Has patient had a PCN reaction causing severe rash involving mucus membranes or skin necrosis: No Has patient had a PCN reaction that required hospitalization: No Has patient had a PCN reaction occurring within the last 10 years: Yes If all of the above answers are "NO", then may proceed with Cephalosporin use. Has tolerated ceftriaxone & cephalexin     ROS Review of Systems As per HPI.    Objective:    Physical Exam Vitals and nursing note reviewed.  Constitutional:      Appearance: She is normal weight. She is not toxic-appearing or diaphoretic.  HENT:     Head: Normocephalic and atraumatic.     Right Ear: Tympanic membrane and ear canal normal.     Left Ear: Tympanic membrane and ear canal normal.     Nose: Congestion present.     Mouth/Throat:     Mouth: Mucous membranes are moist.     Pharynx: Posterior oropharyngeal erythema present. No oropharyngeal exudate.     Tonsils: Tonsillar exudate present. No tonsillar abscesses. 1+ on the right. 1+ on the left.  Eyes:     Conjunctiva/sclera: Conjunctivae normal.  Cardiovascular:     Rate and Rhythm: Normal rate and regular rhythm.     Heart sounds: Normal heart sounds. No murmur heard.   Pulmonary:     Effort: Pulmonary effort is normal. No respiratory distress.     Breath sounds: Normal breath sounds.  Musculoskeletal:     Cervical back: Normal range of motion and neck supple.  Lymphadenopathy:     Cervical: Cervical adenopathy present.  Skin:    General: Skin is warm and dry.  Neurological:     General: No focal deficit present.     Mental Status: She is alert and oriented to person, place, and time.  Psychiatric:        Mood and Affect: Mood normal.         Behavior: Behavior normal.     BP 117/76   Pulse 64   Temp 98.4 F (36.9 C) (Temporal)   SpO2 99% Comment: room air Wt Readings from Last 3 Encounters:  05/04/19 235 lb (106.6 kg)  03/28/19 236 lb 3.2 oz (107.1 kg)  08/10/18 246 lb (111.6 kg)     Health Maintenance Due  Topic Date Due  . Hepatitis C Screening  Never done  . COVID-19 Vaccine (1) Never done  . INFLUENZA VACCINE  01/21/2020  . PAP SMEAR-Modifier  05/04/2020    There  are no preventive care reminders to display for this patient.  Lab Results  Component Value Date   TSH 1.640 03/28/2019   Lab Results  Component Value Date   WBC 8.4 07/20/2017   HGB 13.5 07/20/2017   HCT 40.8 07/20/2017   MCV 81 07/20/2017   PLT 374 07/20/2017   Lab Results  Component Value Date   NA 142 03/28/2019   K 4.3 03/28/2019   CO2 24 03/28/2019   GLUCOSE 110 (H) 03/28/2019   BUN 9 03/28/2019   CREATININE 0.69 03/28/2019   BILITOT 0.4 03/28/2019   ALKPHOS 91 03/28/2019   AST 21 03/28/2019   ALT 22 03/28/2019   PROT 7.2 03/28/2019   ALBUMIN 4.1 03/28/2019   CALCIUM 9.2 03/28/2019   ANIONGAP 7 01/07/2017   Lab Results  Component Value Date   CHOL 155 07/20/2017   Lab Results  Component Value Date   HDL 42 07/20/2017   Lab Results  Component Value Date   LDLCALC 96 07/20/2017   Lab Results  Component Value Date   TRIG 86 07/20/2017   Lab Results  Component Value Date   CHOLHDL 3.7 07/20/2017   Lab Results  Component Value Date   HGBA1C 5.20 02/15/2015      Assessment & Plan:   Morgane was seen today for sore throat.  Diagnoses and all orders for this visit:  Sore throat Negative rapid strep. Viral etiology. Stay well hydrated, throat lozenges, chloraseptic spray.  -     Rapid Strep Screen ([facility] ONLY) -     Novel Coronavirus, NAA ([facility])  URI with cough and congestion Covid test pending, quarantine until results. Tessalon perles for cough. Norel AD and nasal saline for congestion.  Push fluids, stay well hydration.  -     Rapid Strep Screen ([facility] ONLY) -     Novel Coronavirus, NAA ([facility]) -     benzonatate (TESSALON PERLES) 100 MG capsule; Take 1 capsule (100 mg total) by mouth 3 (three) times daily as needed for cough. -     Chlorphen-PE-Acetaminophen 4-10-325 MG TABS; Take 1 tablet by mouth every 6 (six) hours as needed.  Follow-up: Return if symptoms worsen or fail to improve.   The patient indicates understanding of these issues and agrees with the plan.   Gwenlyn Perking, FNP

## 2020-07-27 LAB — SARS-COV-2, NAA 2 DAY TAT

## 2020-07-27 LAB — NOVEL CORONAVIRUS, NAA: SARS-CoV-2, NAA: NOT DETECTED

## 2020-07-29 ENCOUNTER — Telehealth: Payer: Self-pay

## 2020-07-29 NOTE — Telephone Encounter (Signed)
Patient aware and verbalizes understanding. 

## 2020-07-29 NOTE — Telephone Encounter (Signed)
She has now had 2 negative PCR tests for Covid. She may return to work when she feels up to it. Her sore throat is viral- continue OTC lozenges, spray, fluids. Mucinex for congestion. Viral illness often take around 10 days to resolve.

## 2020-07-29 NOTE — Telephone Encounter (Signed)
Patient has a visit with Tiffany on 2/4 and was tested neg for covid and strep.  States that her headache and congestion is better but she still has sore throat with white spots in it with a cough.  Her husband tested positive on the same day.  She can not get the Norell since it is on back order.  Please advise when patient should return to work and what her next steps need to be.

## 2020-08-06 ENCOUNTER — Telehealth: Payer: Self-pay

## 2020-08-06 DIAGNOSIS — J019 Acute sinusitis, unspecified: Secondary | ICD-10-CM

## 2020-08-06 MED ORDER — DOXYCYCLINE HYCLATE 100 MG PO TABS: 100.0000 mg | ORAL_TABLET | Freq: Two times a day (BID) | ORAL | 0 refills | Status: AC

## 2020-08-06 NOTE — Telephone Encounter (Signed)
Patient aware and verbalized understanding. °

## 2020-08-06 NOTE — Telephone Encounter (Signed)
  Incoming Patient Call  08/06/2020  What symptoms do you have? Sore throat, white bumps on throat, chest congestion, head  Congestion. States pharmacy didn't have Norel and didn't know when they would get it. So they put together what they thought was in the Norel which was  Sudafed, Chlor- tabs and Tylenol  How long have you been sick? 2 weeks  Have you been seen for this problem? Yes in [facility] 07-26-2020  If your provider decides to give you a prescription, which pharmacy would you like for it to be sent to? [facility]   Patient informed that this information will be sent to the clinical staff for review and that they should receive a follow up call.

## 2020-08-06 NOTE — Telephone Encounter (Signed)
Does patient need another televisit or Covid clinic appt or can something be called in? Please advise

## 2020-08-20 ENCOUNTER — Other Ambulatory Visit: Payer: Self-pay

## 2020-08-20 ENCOUNTER — Encounter: Payer: Self-pay | Admitting: Obstetrics & Gynecology

## 2020-08-20 ENCOUNTER — Ambulatory Visit (INDEPENDENT_AMBULATORY_CARE_PROVIDER_SITE_OTHER): Payer: Medicaid Other | Admitting: Obstetrics & Gynecology

## 2020-08-20 VITALS — BP 130/69 | HR 57 | Ht 60.0 in | Wt 232.0 lb

## 2020-08-20 DIAGNOSIS — N911 Secondary amenorrhea: Secondary | ICD-10-CM | POA: Diagnosis not present

## 2020-08-20 DIAGNOSIS — R739 Hyperglycemia, unspecified: Secondary | ICD-10-CM | POA: Diagnosis not present

## 2020-08-20 MED ORDER — MEGESTROL ACETATE 40 MG PO TABS: ORAL_TABLET | ORAL | 0 refills | Status: DC

## 2020-08-20 NOTE — Progress Notes (Signed)
Chief Complaint  Patient presents with  . Menstrual Problem    Spotted all of January. Became heavier for all of February.       30 y.o. [id number] No LMP recorded (lmp unknown). The current method of family planning is none.  Outpatient Encounter Medications as of 08/20/2020  Medication Sig  . acetaminophen (TYLENOL) 500 MG tablet Take 1,000 mg by mouth daily as needed.  . megestrol (MEGACE) 40 MG tablet 3 tablets a day for 5 days, 2 tablets a day for 5 days then 1 tablet daily  . [DISCONTINUED] benzonatate (TESSALON PERLES) 100 MG capsule Take 1 capsule (100 mg total) by mouth 3 (three) times daily as needed for cough.  . [DISCONTINUED] Chlorphen-PE-Acetaminophen 4-10-325 MG TABS Take 1 tablet by mouth every 6 (six) hours as needed.   No facility-administered encounter medications on file as of 08/20/2020.    Subjective Pt has had no periods in 2021 the whole year, she did not seek care from our office BTW She had some labs 10/20 all of which were normal, no A1C Since January 1st week spotted the whole month off/on varying colors Since 2/8 approx she began bleeding and has been bleeding ever since No cramping to speak of Bleeding has been heavy at times, clots Bright red for the most part Past Medical History:  Diagnosis Date  . Acid reflux   . Anemia   . Bloating   . Childhood asthma   . History of blood transfusion   . PCOS (polycystic ovarian syndrome) 09/27/2014  . Preeclampsia   . Seasonal allergies     Past Surgical History:  Procedure Laterality Date  . CESAREAN SECTION N/A 01/02/2017   Procedure: CESAREAN SECTION;  Surgeon: Chancy Milroy, MD;  Location: [place];  Service: Obstetrics;  Laterality: N/A;  . CHOLECYSTECTOMY  2011   Dr.Byerly  . ESOPHAGOGASTRODUODENOSCOPY N/A 10/26/2014   Procedure: ESOPHAGOGASTRODUODENOSCOPY (EGD);  Surgeon: Rogene Houston, MD;  [facility];  Service: Endoscopy;  Laterality: N/A;  . UPPER GASTROINTESTINAL  ENDOSCOPY  12/05/2009  . UPPER GASTROINTESTINAL ENDOSCOPY  02/07/2009  . WISDOM TOOTH EXTRACTION    . WISDOM TOOTH EXTRACTION      OB History    Gravida  5   Para  1   Term  1   Preterm      AB  3   Living  1     SAB  3   IAB  0   Ectopic  0   Multiple  0   Live Births  1           Allergies  Allergen Reactions  . Percocet [Oxycodone-Acetaminophen] Hives and Itching  . Amoxicillin Rash    Has patient had a PCN reaction causing immediate rash, facial/tongue/throat swelling, SOB or lightheadedness with hypotension: Yes -mild rash Has patient had a PCN reaction causing severe rash involving mucus membranes or skin necrosis: No Has patient had a PCN reaction that required hospitalization: No Has patient had a PCN reaction occurring within the last 10 years: Yes If all of the above answers are "NO", then may proceed with Cephalosporin use. Has tolerated ceftriaxone & cephalexin     Social History   Socioeconomic History  . Marital status: Married    Spouse name: Not on file  . Number of children: 1  . Years of education: Not on file  . Highest education level: Not on file  Occupational History  . Not on  file  Tobacco Use  . Smoking status: Former Smoker    Packs/day: 0.50    Years: 12.00    Pack years: 6.00    Types: Cigarettes    Quit date: 05/05/2016    Years since quitting: 4.2  . Smokeless tobacco: Never Used  Vaping Use  . Vaping Use: Never used  Substance and Sexual Activity  . Alcohol use: No    Alcohol/week: 0.0 standard drinks  . Drug use: No  . Sexual activity: Yes    Birth control/protection: None  Other Topics Concern  . Not on file  Social History Narrative  . Not on file   Social Determinants of Health   Financial Resource Strain: Not on file  Food Insecurity: Not on file  Transportation Needs: Not on file  Physical Activity: Not on file  Stress: Not on file  Social Connections: Not on file    Family History  Problem  Relation Age of Onset  . Hypertension Mother   . Heart disease Father   . Cancer - Cervical Maternal Aunt   . Kidney disease Maternal Grandmother   . Renal Disease Maternal Grandmother     Medications:       Current Outpatient Medications:  .  acetaminophen (TYLENOL) 500 MG tablet, Take 1,000 mg by mouth daily as needed., Disp: , Rfl:  .  megestrol (MEGACE) 40 MG tablet, 3 tablets a day for 5 days, 2 tablets a day for 5 days then 1 tablet daily, Disp: 45 tablet, Rfl: 0  Objective Blood pressure 130/69, pulse (!) 57, height 5' (1.524 m), weight 232 lb (105.2 kg), unknown if currently breastfeeding.  Gen obese female NAD  Pertinent ROS No burning with urination, frequency or urgency No nausea, vomiting or diarrhea Nor fever chills or other constitutional symptoms   Labs or studies No new    Impression Diagnoses this Encounter::   ICD-10-CM   1. 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 Hyperglycemia  R73.9 Hemoglobin A1C    Established relevant diagnosis(es):   Plan/Recommendations: Meds ordered this encounter  Medications  . megestrol (MEGACE) 40 MG tablet    Sig: 3 tablets a day for 5 days, 2 tablets a day for 5 days then 1 tablet daily    Dispense:  45 tablet    Refill:  0    Labs or Scans Ordered: Orders Placed This Encounter  Procedures  . Hemoglobin A1C    Management:: Megestrol to synchronize the endometrium then withdraw, she has had to do this before  A1C to check for hyperglycemia, will consider metformin as she transitions to trying to get pregnant   Follow up Return if symptoms worsen or fail to improve.        Face to face time:  20 minutes  Greater than 50% of the visit time was spent in counseling and coordination of care with the patient.  The summary and outline of the counseling and care coordination is summarized in the note above.   All questions were answered.

## 2020-08-21 LAB — HEMOGLOBIN A1C
Est. average glucose Bld gHb Est-mCnc: 103 mg/dL
Hgb A1c MFr Bld: 5.2 % (ref 4.8–5.6)

## 2020-09-11 ENCOUNTER — Other Ambulatory Visit: Payer: Self-pay | Admitting: Obstetrics & Gynecology

## 2020-09-16 ENCOUNTER — Telehealth: Payer: Self-pay | Admitting: Obstetrics & Gynecology

## 2020-09-16 NOTE — Telephone Encounter (Signed)
Patient called stating that she would like to know why Dr. Elonda Husky has refilled her megace, and how long does she have to stay on it. Please contact pt

## 2020-10-01 ENCOUNTER — Telehealth: Payer: Self-pay | Admitting: Adult Health

## 2020-10-01 ENCOUNTER — Other Ambulatory Visit: Payer: Self-pay | Admitting: Obstetrics & Gynecology

## 2020-10-01 MED ORDER — CLOMIPHENE CITRATE 50 MG PO TABS: 50.0000 mg | ORAL_TABLET | Freq: Every day | ORAL | 5 refills | Status: DC

## 2020-10-01 NOTE — Telephone Encounter (Signed)
Please see previous message about starting Clomid, pt sent a message to Dr. Elonda Husky 09/30/2020  [facility]   Please advise & notify pt

## 2020-10-01 NOTE — Telephone Encounter (Signed)
Pt usually sees Dr. Elonda Husky. Pt has been on Clomid before and wants to start back on it. See Dr. Brynda Greathouse previous message. Thanks!! [place]

## 2020-10-02 ENCOUNTER — Telehealth: Payer: Self-pay

## 2020-10-02 NOTE — Telephone Encounter (Signed)
Pt returning call to [place] best contact [phone]

## 2020-10-02 NOTE — Telephone Encounter (Signed)
Left message I called and she needs to call me back, need to talk about clomid,

## 2020-10-02 NOTE — Telephone Encounter (Signed)
Pt stopped megace a week ago, and Dr Elonda Husky has sent in Rx for clomid, she is cramping but not bleeding yet, call with period

## 2020-10-03 ENCOUNTER — Telehealth: Payer: Self-pay | Admitting: Adult Health

## 2020-10-03 ENCOUNTER — Telehealth: Payer: Self-pay | Admitting: Obstetrics & Gynecology

## 2020-10-03 DIAGNOSIS — Z319 Encounter for procreative management, unspecified: Secondary | ICD-10-CM

## 2020-10-03 NOTE — Telephone Encounter (Signed)
Spotting today, start clomid Saturday, ck progesterone level  May 4,order is in

## 2020-10-03 NOTE — Telephone Encounter (Signed)
Patient wants Derrek Monaco to know that she started her menstrual cycle today 10/03/2020. Patient also would like for Anderson Malta to call her .

## 2020-10-12 ENCOUNTER — Encounter (HOSPITAL_COMMUNITY): Payer: Self-pay | Admitting: Emergency Medicine

## 2020-10-12 ENCOUNTER — Emergency Department (HOSPITAL_COMMUNITY)
Admission: EM | Admit: 2020-10-12 | Discharge: 2020-10-12 | Disposition: A | Discharge status: Home / Self Care | Payer: Medicaid Other | Attending: Emergency Medicine | Admitting: Emergency Medicine

## 2020-10-12 ENCOUNTER — Other Ambulatory Visit: Payer: Self-pay

## 2020-10-12 DIAGNOSIS — Z87891 Personal history of nicotine dependence: Secondary | ICD-10-CM | POA: Diagnosis not present

## 2020-10-12 DIAGNOSIS — J45909 Unspecified asthma, uncomplicated: Secondary | ICD-10-CM | POA: Insufficient documentation

## 2020-10-12 DIAGNOSIS — N939 Abnormal uterine and vaginal bleeding, unspecified: Secondary | ICD-10-CM

## 2020-10-12 LAB — CBC WITH DIFFERENTIAL/PLATELET
Abs Immature Granulocytes: 0.05 10*3/uL (ref 0.00–0.07)
Basophils Absolute: 0.1 10*3/uL (ref 0.0–0.1)
Basophils Relative: 0 %
Eosinophils Absolute: 0.1 10*3/uL (ref 0.0–0.5)
Eosinophils Relative: 1 %
HCT: 42.6 % (ref 36.0–46.0)
Hemoglobin: 13.7 g/dL (ref 12.0–15.0)
Immature Granulocytes: 0 %
Lymphocytes Relative: 17 %
Lymphs Abs: 2.1 10*3/uL (ref 0.7–4.0)
MCH: 27.4 pg (ref 26.0–34.0)
MCHC: 32.2 g/dL (ref 30.0–36.0)
MCV: 85.2 fL (ref 80.0–100.0)
Monocytes Absolute: 1 10*3/uL (ref 0.1–1.0)
Monocytes Relative: 8 %
Neutro Abs: 9.5 10*3/uL — ABNORMAL HIGH (ref 1.7–7.7)
Neutrophils Relative %: 74 %
Platelets: 346 10*3/uL (ref 150–400)
RBC: 5 MIL/uL (ref 3.87–5.11)
RDW: 12.2 % (ref 11.5–15.5)
WBC: 12.8 10*3/uL — ABNORMAL HIGH (ref 4.0–10.5)
nRBC: 0 % (ref 0.0–0.2)

## 2020-10-12 LAB — BASIC METABOLIC PANEL
Anion gap: 8 (ref 5–15)
BUN: 12 mg/dL (ref 6–20)
CO2: 25 mmol/L (ref 22–32)
Calcium: 9 mg/dL (ref 8.9–10.3)
Chloride: 107 mmol/L (ref 98–111)
Creatinine, Ser: 0.77 mg/dL (ref 0.44–1.00)
GFR, Estimated: >60 mL/min (ref >60)
Glucose, Bld: 103 mg/dL — ABNORMAL HIGH (ref 70–99)
Potassium: 4.2 mmol/L (ref 3.5–5.1)
Sodium: 140 mmol/L (ref 135–145)

## 2020-10-12 LAB — HCG, QUANTITATIVE, PREGNANCY: hCG, Beta Chain, Quant, S: <1 m[IU]/mL (ref <5)

## 2020-10-12 NOTE — Discharge Instructions (Addendum)
Follow up with your gynecologist for evaluation

## 2020-10-12 NOTE — ED Triage Notes (Signed)
Pt having heavy vaginal bleeding, that started this morning, previous episode of same the whole month of February, put on Megace and Clomid.

## 2020-10-12 NOTE — ED Provider Notes (Signed)
[facility] EMERGENCY DEPARTMENT Provider Note   CSN: [id number] Arrival date & time: 10/12/20  1219     History Chief Complaint  Patient presents with  . Vaginal Bleeding    Mckenzie Cooley is a 30 y.o. female.  Pt reports severe cramping before coming in.  Pt is on clomid and reports this happened before.  Pt reports she passed some tissue that looks like endometrial tissue.  Pt reports this happened before   The history is provided by the patient. No language interpreter was used.  Vaginal Bleeding Quality:  Dark red Severity:  Moderate Onset quality:  Gradual Duration:  1 day Timing:  Constant Menstrual history:  Irregular Relieved by:  Nothing Worsened by:  Nothing Ineffective treatments:  None tried Associated symptoms: abdominal pain        Past Medical History:  Diagnosis Date  . Acid reflux   . Anemia   . Bloating   . Childhood asthma   . History of blood transfusion   . PCOS (polycystic ovarian syndrome) 09/27/2014  . Preeclampsia   . Seasonal allergies     Patient Active Problem List   Diagnosis Date Noted  . Spotting 12/14/2018  . Positive urine pregnancy test 12/14/2018  . Carpal tunnel syndrome, left upper limb 06/01/2018  . Migraine 12/09/2017  . Iron deficiency 07/20/2017  . Viral upper respiratory tract infection 03/24/2017  . Allergic otitis media of both ears 03/24/2017  . History of severe pre-eclampsia 02/23/2017  . History of anemia 02/23/2017  . Previous cesarean section 02/09/2017  . H/O severe preeclampsia 02/09/2017  . Postoperative anemia due to acute blood loss 01/06/2017  . Smoker 09/27/2014  . Asthma 07/01/2011  . Gastroesophageal reflux disease 07/01/2011    Past Surgical History:  Procedure Laterality Date  . CESAREAN SECTION N/A 01/02/2017   Procedure: CESAREAN SECTION;  Surgeon: Chancy Milroy, MD;  Location: [place];  Service: Obstetrics;  Laterality: N/A;  . CHOLECYSTECTOMY  2011   Dr.Byerly  .  ESOPHAGOGASTRODUODENOSCOPY N/A 10/26/2014   Procedure: ESOPHAGOGASTRODUODENOSCOPY (EGD);  Surgeon: Rogene Houston, MD;  [facility];  Service: Endoscopy;  Laterality: N/A;  . UPPER GASTROINTESTINAL ENDOSCOPY  12/05/2009  . UPPER GASTROINTESTINAL ENDOSCOPY  02/07/2009  . WISDOM TOOTH EXTRACTION    . WISDOM TOOTH EXTRACTION       OB History    Gravida  5   Para  1   Term  1   Preterm      AB  3   Living  1     SAB  3   IAB  0   Ectopic  0   Multiple  0   Live Births  1           Family History  Problem Relation Age of Onset  . Hypertension Mother   . Heart disease Father   . Cancer - Cervical Maternal Aunt   . Kidney disease Maternal Grandmother   . Renal Disease Maternal Grandmother     Social History   Tobacco Use  . Smoking status: Former Smoker    Packs/day: 0.50    Years: 12.00    Pack years: 6.00    Types: Cigarettes    Quit date: 05/05/2016    Years since quitting: 4.4  . Smokeless tobacco: Never Used  Vaping Use  . Vaping Use: Never used  Substance Use Topics  . Alcohol use: No    Alcohol/week: 0.0 standard drinks  . Drug use: No  Home Medications Prior to Admission medications   Medication Sig Start Date End Date Taking? Authorizing Provider  acetaminophen (TYLENOL) 500 MG tablet Take 1,000 mg by mouth daily as needed.    [provider]  clomiPHENE (CLOMID) 50 MG tablet Take 1 tablet (50 mg total) by mouth daily. Days 1-5 of the cycle 10/01/20   Florian Buff, MD  megestrol (MEGACE) 40 MG tablet TAKE 3 TABS A DAY FOR 5 DAYS, 2 TABS A DAY FOR 5 DAYS THEN 1 TABLET DAILY 09/12/20   Florian Buff, MD    Allergies    Percocet [oxycodone-acetaminophen] and Amoxicillin  Review of Systems   Review of Systems  Gastrointestinal: Positive for abdominal pain.  Genitourinary: Positive for vaginal bleeding.    Physical Exam Updated Vital Signs BP 131/74 (BP Location: Right Arm)   Pulse 67   Temp 98.2 F (36.8 C)  (Oral)   Resp 18   Ht 5' (1.524 m)   Wt 104.3 kg   LMP 10/12/2020   SpO2 100%   BMI 44.92 kg/m   Physical Exam Vitals and nursing note reviewed.  Constitutional:      General: She is not in acute distress.    Appearance: She is well-developed.  HENT:     Head: Normocephalic and atraumatic.  Eyes:     Conjunctiva/sclera: Conjunctivae normal.  Cardiovascular:     Rate and Rhythm: Normal rate and regular rhythm.     Heart sounds: No murmur heard.   Pulmonary:     Effort: Pulmonary effort is normal. No respiratory distress.     Breath sounds: Normal breath sounds.  Abdominal:     Palpations: Abdomen is soft.     Tenderness: There is no abdominal tenderness.  Musculoskeletal:     Cervical back: Neck supple.  Skin:    General: Skin is warm and dry.  Neurological:     Mental Status: She is alert.  Psychiatric:        Mood and Affect: Mood normal.     ED Results / Procedures / Treatments   Labs (all labs ordered are listed, but only abnormal results are displayed) Labs Reviewed  CBC WITH DIFFERENTIAL/PLATELET - Abnormal; Notable for the following components:      Result Value   WBC 12.8 (*)    Neutro Abs 9.5 (*)    All other components within normal limits  BASIC METABOLIC PANEL - Abnormal; Notable for the following components:   Glucose, Bld 103 (*)    All other components within normal limits  HCG, QUANTITATIVE, PREGNANCY    EKG None  Radiology No results found.  Procedures Procedures   Medications Ordered in ED Medications - No data to display  ED Course  I have reviewed the triage vital signs and the nursing notes.  Pertinent labs & imaging results that were available during my care of the patient were reviewed by me and considered in my medical decision making (see chart for details).    MDM Rules/Calculators/A&P                          MDM:  Pregnancy negative.  Labs no acute abnormality.  Pt went to bathroom and passed some blood and tissue  and pain resolved.  Pt states she feel better and wants to go home.  Pt advised to discuss symptoms with her obgyn  Final Clinical Impression(s) / ED Diagnoses Final diagnoses:  Vaginal bleeding    Rx /  DC Orders ED Discharge Orders    None    An After Visit Summary was printed and given to the patient.   Fransico Meadow, Hershal Coria 10/12/20 1627    Fredia Sorrow, MD 10/13/20 [phone]

## 2020-10-14 ENCOUNTER — Telehealth: Payer: Self-pay | Admitting: *Deleted

## 2020-10-14 ENCOUNTER — Telehealth: Payer: Self-pay

## 2020-10-14 NOTE — Telephone Encounter (Signed)
Pt calling to see if she needs a f/u from her ED visit on 10/12/2020. She stated that she is still bleeding and is extremely sore

## 2020-10-14 NOTE — Telephone Encounter (Signed)
Transition Care Management Follow-up Telephone Call  Date of discharge and from where: 10/12/2020 - [facility]  How have you been since you were released from the hospital? "I'm fine"  Any questions or concerns? No  Items Reviewed:  Did the pt receive and understand the discharge instructions provided? Yes   Medications obtained and verified? Yes   Other? No   Any new allergies since your discharge? No   Dietary orders reviewed? No  Do you have support at home? Yes    Functional Questionnaire: (I = Independent and D = Dependent) ADLs: I  Bathing/Dressing- I  Meal Prep- I  Eating- I  Maintaining continence- I  Transferring/Ambulation- I  Managing Meds- I  Follow up appointments reviewed:   PCP Hospital f/u appt confirmed? No    Specialist Hospital f/u appt confirmed? No    Are transportation arrangements needed? No   If their condition worsens, is the pt aware to call PCP or go to the Emergency Dept.? Yes  Was the patient provided with contact information for the PCP's office or ED? Yes  Was to pt encouraged to call back with questions or concerns? Yes

## 2020-10-23 DIAGNOSIS — Z319 Encounter for procreative management, unspecified: Secondary | ICD-10-CM | POA: Diagnosis not present

## 2020-10-24 LAB — PROGESTERONE: Progesterone: 0.1 ng/mL

## 2020-10-30 ENCOUNTER — Other Ambulatory Visit: Payer: Self-pay

## 2020-10-30 ENCOUNTER — Encounter: Payer: Self-pay | Admitting: Family Medicine

## 2020-10-30 ENCOUNTER — Ambulatory Visit (INDEPENDENT_AMBULATORY_CARE_PROVIDER_SITE_OTHER): Payer: Medicaid Other | Admitting: Family Medicine

## 2020-10-30 VITALS — BP 109/66 | HR 79 | Temp 98.4°F | Ht 60.0 in | Wt 234.2 lb

## 2020-10-30 DIAGNOSIS — R42 Dizziness and giddiness: Secondary | ICD-10-CM

## 2020-10-30 DIAGNOSIS — G5603 Carpal tunnel syndrome, bilateral upper limbs: Secondary | ICD-10-CM | POA: Diagnosis not present

## 2020-10-30 MED ORDER — MECLIZINE HCL 12.5 MG PO TABS: 12.5000 mg | ORAL_TABLET | Freq: Three times a day (TID) | ORAL | 0 refills | Status: DC | PRN

## 2020-10-30 NOTE — Patient Instructions (Signed)
Vertigo Vertigo is the feeling that you or your surroundings are moving when they are not. This feeling can come and go at any time. Vertigo often goes away on its own. Vertigo can be dangerous if it occurs while you are doing something that could endanger you or others, such as driving or operating machinery. Your health care provider will do tests to determine the cause of your vertigo. Tests will also help your health care provider decide how best to treat your condition. Follow these instructions at home: Eating and drinking  Drink enough fluid to keep your urine pale yellow.  Do not drink alcohol.      Activity  Return to your normal activities as told by your health care provider. Ask your health care provider what activities are safe for you.  In the morning, first sit up on the side of the bed. When you feel okay, stand slowly while you hold onto something until you know that your balance is fine.  Move slowly. Avoid sudden body or head movements or certain positions, as told by your health care provider.  If you have trouble walking or keeping your balance, try using a cane for stability. If you feel dizzy or unstable, sit down right away.  Avoid doing any tasks that would cause danger to you or others if vertigo occurs.  Avoid bending down if you feel dizzy. Place items in your home so that they are easy for you to reach without leaning over.  Do not drive or use heavy machinery if you feel dizzy. General instructions  Take over-the-counter and prescription medicines only as told by your health care provider.  Keep all follow-up visits as told by your health care provider. This is important. Contact a health care provider if:  Your medicines do not relieve your vertigo or they make it worse.  You have a fever.  Your condition gets worse or you develop new symptoms.  Your family or friends notice any behavioral changes.  Your nausea or vomiting gets worse.  You  have numbness or a prickling and tingling sensation in part of your body. Get help right away if you:  Have difficulty moving or speaking.  Are always dizzy.  Faint.  Develop severe headaches.  Have weakness in your hands, arms, or legs.  Have changes in your hearing or vision.  Develop a stiff neck.  Develop sensitivity to light. Summary  Vertigo is the feeling that you or your surroundings are moving when they are not.  Your health care provider will do tests to determine the cause of your vertigo.  Follow instructions for home care. You may be told to avoid certain tasks, positions, or movements.  Contact a health care provider if your medicines do not relieve your symptoms, or if you have a fever, nausea, vomiting, or changes in behavior.  Get help right away if you have severe headaches or difficulty speaking, or you develop hearing or vision problems. This information is not intended to replace advice given to you by your health care provider. Make sure you discuss any questions you have with your health care provider. Document Revised: 05/02/2018 Document Reviewed: 05/02/2018 Elsevier Patient Education  2021 [employer].

## 2020-10-30 NOTE — Progress Notes (Signed)
Acute Office Visit  Subjective:    Patient ID: Mckenzie Cooley, female    DOB: Aug 10, 1990, 30 y.o.   MRN: [id number]  Chief Complaint  Patient presents with  . Dizziness    HPI Patient is in today for dizziness for the last month. It has been intermittent and lasts for just a few seconds. She feels off balance when this happens and wobbly. Her vision is blurred for a few seconds. This happens when she looks down, if she closes her eyes, or with walking. Denies lightheadedness. She denies nausea or vomiting. She report frequent headaches but states that this is normal for her. The dizziness is worse when she has a bad migraine. Denies fever or neck pain. She has been extra tired lately. She does stay well hydrated and eats frequently. Denies chest pain, palpitations, or shortness of breath. Denies anxiety when this happens. Denies focal weakness, numbness, tingling, or changes in speech.   She also reports carpal tunnel in both hand. She was set up to have the surgery a few years ago but was not able to pay out of pocket for this. This now as insurance and would like to have this done. She works at the [employer]. She reports numbness and pain in both wrist and forearms. The pain and numbness is intermittent. It is worse when she uses her hands and at night.   Past Medical History:  Diagnosis Date  . Acid reflux   . Anemia   . Bloating   . Childhood asthma   . History of blood transfusion   . PCOS (polycystic ovarian syndrome) 09/27/2014  . Preeclampsia   . Seasonal allergies     Past Surgical History:  Procedure Laterality Date  . CESAREAN SECTION N/A 01/02/2017   Procedure: CESAREAN SECTION;  Surgeon: Chancy Milroy, MD;  Location: [place];  Service: Obstetrics;  Laterality: N/A;  . CHOLECYSTECTOMY  2011   Dr.Byerly  . ESOPHAGOGASTRODUODENOSCOPY N/A 10/26/2014   Procedure: ESOPHAGOGASTRODUODENOSCOPY (EGD);  Surgeon: Rogene Houston, MD;  [facility];   Service: Endoscopy;  Laterality: N/A;  . UPPER GASTROINTESTINAL ENDOSCOPY  12/05/2009  . UPPER GASTROINTESTINAL ENDOSCOPY  02/07/2009  . WISDOM TOOTH EXTRACTION    . WISDOM TOOTH EXTRACTION      Family History  Problem Relation Age of Onset  . Hypertension Mother   . Heart disease Father   . Cancer - Cervical Maternal Aunt   . Kidney disease Maternal Grandmother   . Renal Disease Maternal Grandmother     Social History   Socioeconomic History  . Marital status: Married    Spouse name: Not on file  . Number of children: 1  . Years of education: Not on file  . Highest education level: Not on file  Occupational History  . Not on file  Tobacco Use  . Smoking status: Former Smoker    Packs/day: 0.50    Years: 12.00    Pack years: 6.00    Types: Cigarettes    Quit date: 05/05/2016    Years since quitting: 4.4  . Smokeless tobacco: Never Used  Vaping Use  . Vaping Use: Never used  Substance and Sexual Activity  . Alcohol use: No    Alcohol/week: 0.0 standard drinks  . Drug use: No  . Sexual activity: Yes    Birth control/protection: None  Other Topics Concern  . Not on file  Social History Narrative  . Not on file   Social Determinants of  Health   Financial Resource Strain: Not on file  Food Insecurity: Not on file  Transportation Needs: Not on file  Physical Activity: Not on file  Stress: Not on file  Social Connections: Not on file  Intimate Partner Violence: Not on file    Outpatient Medications Prior to Visit  Medication Sig Dispense Refill  . acetaminophen (TYLENOL) 500 MG tablet Take 1,000 mg by mouth daily as needed.    . clomiPHENE (CLOMID) 50 MG tablet Take 1 tablet (50 mg total) by mouth daily. Days 1-5 of the cycle 5 tablet 5  . megestrol (MEGACE) 40 MG tablet TAKE 3 TABS A DAY FOR 5 DAYS, 2 TABS A DAY FOR 5 DAYS THEN 1 TABLET DAILY 45 tablet 3   No facility-administered medications prior to visit.    Allergies  Allergen Reactions  . Percocet  [Oxycodone-Acetaminophen] Hives and Itching  . Amoxicillin Rash    Has patient had a PCN reaction causing immediate rash, facial/tongue/throat swelling, SOB or lightheadedness with hypotension: Yes -mild rash Has patient had a PCN reaction causing severe rash involving mucus membranes or skin necrosis: No Has patient had a PCN reaction that required hospitalization: No Has patient had a PCN reaction occurring within the last 10 years: Yes If all of the above answers are "NO", then may proceed with Cephalosporin use. Has tolerated ceftriaxone & cephalexin     Review of Systems As per HPI.      Objective:    Physical Exam Vitals and nursing note reviewed.  Constitutional:      General: She is not in acute distress.    Appearance: Normal appearance. She is not ill-appearing, toxic-appearing or diaphoretic.  HENT:     Head: Normocephalic and atraumatic.     Mouth/Throat:     Mouth: Mucous membranes are moist.     Pharynx: Oropharynx is clear.  Eyes:     General: No visual field deficit or scleral icterus.       Right eye: No discharge.        Left eye: No discharge.     Extraocular Movements:     Right eye: Nystagmus (horizontal) present.     Left eye: Nystagmus (horizontal) present.     Pupils: Pupils are equal, round, and reactive to light.  Cardiovascular:     Rate and Rhythm: Normal rate and regular rhythm.     Heart sounds: Normal heart sounds. No murmur heard.   Pulmonary:     Effort: Pulmonary effort is normal. No respiratory distress.     Breath sounds: Normal breath sounds.  Musculoskeletal:     Right lower leg: No edema.     Left lower leg: No edema.     Comments: + bilateral phalen's  Skin:    General: Skin is warm and dry.  Neurological:     General: No focal deficit present.     Mental Status: She is alert and oriented to person, place, and time.     Cranial Nerves: No cranial nerve deficit, dysarthria or facial asymmetry.     Sensory: Sensation is intact.      Motor: No weakness, tremor, atrophy or abnormal muscle tone.     Coordination: Romberg sign positive (increase sway). Finger-Nose-Finger Test normal. Rapid alternating movements normal.     Gait: Gait normal.     BP 109/66   Pulse 79   Temp 98.4 F (36.9 C) (Temporal)   Ht 5' (1.524 m)   Wt 234 lb 4 oz (106.3  kg)   LMP 10/12/2020   BMI 45.75 kg/m  Wt Readings from Last 3 Encounters:  10/30/20 234 lb 4 oz (106.3 kg)  10/12/20 230 lb (104.3 kg)  08/20/20 232 lb (105.2 kg)    Health Maintenance Due  Topic Date Due  . Hepatitis C Screening  Never done  . PAP SMEAR-Modifier  05/04/2020    There are no preventive care reminders to display for this patient.   Lab Results  Component Value Date   TSH 1.640 03/28/2019   Lab Results  Component Value Date   WBC 12.8 (H) 10/12/2020   HGB 13.7 10/12/2020   HCT 42.6 10/12/2020   MCV 85.2 10/12/2020   PLT 346 10/12/2020   Lab Results  Component Value Date   NA 140 10/12/2020   K 4.2 10/12/2020   CO2 25 10/12/2020   GLUCOSE 103 (H) 10/12/2020   BUN 12 10/12/2020   CREATININE 0.77 10/12/2020   BILITOT 0.4 03/28/2019   ALKPHOS 91 03/28/2019   AST 21 03/28/2019   ALT 22 03/28/2019   PROT 7.2 03/28/2019   ALBUMIN 4.1 03/28/2019   CALCIUM 9.0 10/12/2020   ANIONGAP 8 10/12/2020   Lab Results  Component Value Date   CHOL 155 07/20/2017   Lab Results  Component Value Date   HDL 42 07/20/2017   Lab Results  Component Value Date   LDLCALC 96 07/20/2017   Lab Results  Component Value Date   TRIG 86 07/20/2017   Lab Results  Component Value Date   CHOLHDL 3.7 07/20/2017   Lab Results  Component Value Date   HGBA1C 5.2 08/20/2020       Assessment & Plan:   Shermika was seen today for dizziness.  Diagnoses and all orders for this visit:  Dizziness Discussed vertigo as likely cause. Intermittent short spells with head movement and closing eyes. + horizontal nystagmus and + romberg. Meclizine as below.  Referral to vestibular rehab. Labs pending. -     CBC with Differential/Platelet -     CMP14+EGFR -     Thyroid Panel With TSH -     meclizine (ANTIVERT) 12.5 MG tablet; Take 1-2 tablets (12.5-25 mg total) by mouth 3 (three) times daily as needed for dizziness. -     Ambulatory referral to Physical Therapy  Bilateral carpal tunnel syndrome Previously surgery scheduled but was not unable to pay out of pocket. Would like new referral now that she has insurance. Referral placed.  -     Ambulatory referral to Orthopedic Surgery   Return to office for new or worsening symptoms, or if symptoms persist.   The patient indicates understanding of these issues and agrees with the plan.  Gwenlyn Perking, FNP

## 2020-10-31 LAB — CMP14+EGFR
ALT: 24 IU/L (ref 0–32)
AST: 20 IU/L (ref 0–40)
Albumin/Globulin Ratio: 1.5 (ref 1.2–2.2)
Albumin: 4.1 g/dL (ref 3.9–5.0)
Alkaline Phosphatase: 73 IU/L (ref 44–121)
BUN/Creatinine Ratio: 17 (ref 9–23)
BUN: 15 mg/dL (ref 6–20)
Bilirubin Total: 0.3 mg/dL (ref 0.0–1.2)
CO2: 24 mmol/L (ref 20–29)
Calcium: 9.3 mg/dL (ref 8.7–10.2)
Chloride: 103 mmol/L (ref 96–106)
Creatinine, Ser: 0.86 mg/dL (ref 0.57–1.00)
Globulin, Total: 2.7 g/dL (ref 1.5–4.5)
Glucose: 101 mg/dL — ABNORMAL HIGH (ref 65–99)
Potassium: 4.4 mmol/L (ref 3.5–5.2)
Sodium: 141 mmol/L (ref 134–144)
Total Protein: 6.8 g/dL (ref 6.0–8.5)
eGFR: 93 mL/min/{1.73_m2} (ref >59)

## 2020-10-31 LAB — CBC WITH DIFFERENTIAL/PLATELET
Basophils Absolute: 0.1 10*3/uL (ref 0.0–0.2)
Basos: 1 %
EOS (ABSOLUTE): 0.3 10*3/uL (ref 0.0–0.4)
Eos: 3 %
Hematocrit: 41.6 % (ref 34.0–46.6)
Hemoglobin: 13 g/dL (ref 11.1–15.9)
Immature Grans (Abs): 0 10*3/uL (ref 0.0–0.1)
Immature Granulocytes: 0 %
Lymphocytes Absolute: 2.2 10*3/uL (ref 0.7–3.1)
Lymphs: 22 %
MCH: 26.4 pg — ABNORMAL LOW (ref 26.6–33.0)
MCHC: 31.3 g/dL — ABNORMAL LOW (ref 31.5–35.7)
MCV: 85 fL (ref 79–97)
Monocytes Absolute: 0.7 10*3/uL (ref 0.1–0.9)
Monocytes: 7 %
Neutrophils Absolute: 6.7 10*3/uL (ref 1.4–7.0)
Neutrophils: 67 %
Platelets: 289 10*3/uL (ref 150–450)
RBC: 4.92 x10E6/uL (ref 3.77–5.28)
RDW: 12.6 % (ref 11.7–15.4)
WBC: 9.9 10*3/uL (ref 3.4–10.8)

## 2020-10-31 LAB — THYROID PANEL WITH TSH
Free Thyroxine Index: 1.7 (ref 1.2–4.9)
T3 Uptake Ratio: 25 % (ref 24–39)
T4, Total: 6.6 ug/dL (ref 4.5–12.0)
TSH: 1.15 u[IU]/mL (ref 0.450–4.500)

## 2020-11-06 ENCOUNTER — Encounter: Payer: Self-pay | Admitting: Orthopaedic Surgery

## 2020-11-06 ENCOUNTER — Other Ambulatory Visit: Payer: Self-pay

## 2020-11-06 ENCOUNTER — Ambulatory Visit (INDEPENDENT_AMBULATORY_CARE_PROVIDER_SITE_OTHER): Payer: Medicaid Other | Admitting: Orthopaedic Surgery

## 2020-11-06 DIAGNOSIS — G5603 Carpal tunnel syndrome, bilateral upper limbs: Secondary | ICD-10-CM | POA: Diagnosis not present

## 2020-11-06 NOTE — Progress Notes (Signed)
Office Visit Note   Patient: Mckenzie Cooley           Date of Birth: [birth year]           MRN: [id number] Visit Date: 11/06/2020              Requested by: Gwenlyn Perking, [place],  [place] [address] PCP: Gwenlyn Perking, FNP   Assessment & Plan: Visit Diagnoses:  1. Bilateral carpal tunnel syndrome     Plan: Mckenzie Cooley has bilateral carpal tunnel syndrome diagnosed with EMGs nerve conduction studies in November 2019.  For insurance reasons she was unable to proceed with surgical release.  She notes that now with her Medicaid that she like to proceed.  She is having more trouble on the right than the left.  She has had numbness and tingling when she drives a car even when she is sleeping at night.  She has worn the splints which give her some relief at night but do not prevent the symptoms from occurring during the day.  EMGs and nerve conduction studies demonstrated moderate to severe bilateral carpal tunnel.  She relates she is not diabetic nor does she have low thyroid.  Have discussed the surgery with her again as I did several years ago in terms of the anesthesia, outpatient nature of the surgery, and potential complications.  She is also aware that may not completely resolve her pain.  She like to start on the right as it now is more symptomatic than normal  Follow-Up Instructions: Return We will schedule right carpal tunnel release.   Orders:  No orders of the defined types were placed in this encounter.  No orders of the defined types were placed in this encounter.     Procedures: No procedures performed   Clinical Data: No additional findings.   Subjective: Chief Complaint  Patient presents with  . Right Hand - Pain  . Left Hand - Pain  Patient presents today for both hands. She said that she has carpal tunnel bilaterally. She was scheduled for surgery two years ago but had to cancel due to financial reasons. She said that her right may be worse  than the left. 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 She takes Ibuprofen as needed. She is left hand dominant.   HPI  Review of Systems   Objective: Vital Signs: LMP 10/12/2020   Physical Exam Constitutional:      Appearance: She is well-developed.  Eyes:     Pupils: Pupils are equal, round, and reactive to light.  Pulmonary:     Effort: Pulmonary effort is normal.  Skin:    General: Skin is warm and dry.  Neurological:     Mental Status: She is alert and oriented to person, place, and time.  Psychiatric:        Behavior: Behavior normal.     Ortho Exam bilateral Phalen's and Tinel's at both wrists consistent with carpal tunnel syndrome.  Good opposition of thumb the little finger with good strength of opponens pollicis muscles.  Still has some residual tingling in the tips of her fingers more so the radial 3 than the ulnar 2.  Good capillary refill  Specialty Comments:  No specialty comments available.  Imaging: No results found.   PMFS History: Patient Active Problem List   Diagnosis Date Noted  . Bilateral carpal tunnel syndrome 11/06/2020  . Spotting 12/14/2018  .  Positive urine pregnancy test 12/14/2018  . Carpal tunnel syndrome, left upper limb 06/01/2018  . Migraine 12/09/2017  . Iron deficiency 07/20/2017  . Viral upper respiratory tract infection 03/24/2017  . Allergic otitis media of both ears 03/24/2017  . History of severe pre-eclampsia 02/23/2017  . History of anemia 02/23/2017  . Previous cesarean section 02/09/2017  . H/O severe preeclampsia 02/09/2017  . Postoperative anemia due to acute blood loss 01/06/2017  . Smoker 09/27/2014  . Asthma 07/01/2011  . Gastroesophageal reflux disease 07/01/2011   Past Medical History:  Diagnosis Date  . Acid reflux   . Anemia   . Bloating   . Childhood asthma   . History of blood transfusion   . PCOS (polycystic ovarian syndrome) 09/27/2014  . Preeclampsia   .  Seasonal allergies     Family History  Problem Relation Age of Onset  . Hypertension Mother   . Heart disease Father   . Cancer - Cervical Maternal Aunt   . Kidney disease Maternal Grandmother   . Renal Disease Maternal Grandmother     Past Surgical History:  Procedure Laterality Date  . CESAREAN SECTION N/A 01/02/2017   Procedure: CESAREAN SECTION;  Surgeon: Chancy Milroy, MD;  Location: [facility];  Service: Obstetrics;  Laterality: N/A;  . CHOLECYSTECTOMY  2011   Dr.Byerly  . ESOPHAGOGASTRODUODENOSCOPY N/A 10/26/2014   Procedure: ESOPHAGOGASTRODUODENOSCOPY (EGD);  Surgeon: Rogene Houston, MD;  [facility];  Service: Endoscopy;  Laterality: N/A;  . UPPER GASTROINTESTINAL ENDOSCOPY  12/05/2009  . UPPER GASTROINTESTINAL ENDOSCOPY  02/07/2009  . WISDOM TOOTH EXTRACTION    . WISDOM TOOTH EXTRACTION     Social History   Occupational History  . Not on file  Tobacco Use  . Smoking status: Former Smoker    Packs/day: 0.50    Years: 12.00    Pack years: 6.00    Types: Cigarettes    Quit date: 05/05/2016    Years since quitting: 4.5  . Smokeless tobacco: Never Used  Vaping Use  . Vaping Use: Never used  Substance and Sexual Activity  . Alcohol use: No    Alcohol/week: 0.0 standard drinks  . Drug use: No  . Sexual activity: Yes    Birth control/protection: None

## 2020-11-08 DIAGNOSIS — H5213 Myopia, bilateral: Secondary | ICD-10-CM | POA: Diagnosis not present

## 2020-11-20 ENCOUNTER — Other Ambulatory Visit: Payer: Self-pay

## 2020-11-20 ENCOUNTER — Ambulatory Visit (HOSPITAL_COMMUNITY): Payer: Medicaid Other | Attending: Family Medicine | Admitting: Physical Therapy

## 2020-11-20 DIAGNOSIS — R42 Dizziness and giddiness: Secondary | ICD-10-CM | POA: Diagnosis not present

## 2020-11-20 NOTE — Therapy (Signed)
[facility] [address] [place], [place], [address] Phone: [phone]   Fax:  [phone]  Physical Therapy Evaluation  Patient Details  Name: Mckenzie Cooley MRN: [id number] Date of Birth: [birth year] Referring Provider (PT): Marjorie Smolder   Encounter Date: 11/20/2020   PT End of Session - 11/20/20 0957    Visit Number 1    Number of Visits 4    Date for PT Re-Evaluation 12/20/20    Authorization Type medicar healthy    PT Start Time 0825    PT Stop Time 0900    PT Time Calculation (min) 35 min    Activity Tolerance Patient tolerated treatment well    Behavior During Therapy [facility] for tasks assessed/performed           Past Medical History:  Diagnosis Date  . Acid reflux   . Anemia   . Bloating   . Childhood asthma   . History of blood transfusion   . PCOS (polycystic ovarian syndrome) 09/27/2014  . Preeclampsia   . Seasonal allergies     Past Surgical History:  Procedure Laterality Date  . CESAREAN SECTION N/A 01/02/2017   Procedure: CESAREAN SECTION;  Surgeon: Chancy Milroy, MD;  Location: [facility];  Service: Obstetrics;  Laterality: N/A;  . CHOLECYSTECTOMY  2011   Dr.Byerly  . ESOPHAGOGASTRODUODENOSCOPY N/A 10/26/2014   Procedure: ESOPHAGOGASTRODUODENOSCOPY (EGD);  Surgeon: Rogene Houston, MD;  [facility];  Service: Endoscopy;  Laterality: N/A;  . UPPER GASTROINTESTINAL ENDOSCOPY  12/05/2009  . UPPER GASTROINTESTINAL ENDOSCOPY  02/07/2009  . WISDOM TOOTH EXTRACTION    . WISDOM TOOTH EXTRACTION      There were no vitals filed for this visit.    Subjective Assessment - 11/20/20 0817    Subjective Pt has been experiencing dizziness since early May with certain head motions.    Pertinent History B carpal tunnel, B knee pain    Currently in Pain? --   yes but we are not seeing this patient for this diagnosis.             [facility] PT Assessment - 11/20/20 0001      Assessment   Medical  Diagnosis dizziness    Referring Provider (PT) Tiffany Lilia Pro    Onset Date/Surgical Date 10/20/20    Next MD Visit not scheduled    Prior Therapy none      Precautions   Precautions None      Restrictions   Weight Bearing Restrictions No      Balance Screen   Has the patient fallen in the past 6 months No    Has the patient had a decrease in activity level because of a fear of falling?  No    Is the patient reluctant to leave their home because of a fear of falling?  No      Functional Tests   Functional tests Single leg stance      Single Leg Stance   Comments RT:48"; Lt: 32"      ROM / Strength   AROM / PROM / Strength --   cervical ROM WFL                 Vestibular Assessment - 11/20/20 0001      Symptom Behavior   Subjective history of current problem Pt states that the meclazine is helping her sx but it makes her sleepy.  She states if she looks down or closes her eyes  she starts to stumbles, it just feels like her balance is off.    Type of Dizziness  Imbalance    Frequency of Dizziness unable to say multiple times an hour but it only last for a second    Duration of Dizziness seconds    Symptom Nature Positional;Motion provoked    Aggravating Factors Comment   looking down and closing eyes   Relieving Factors Medication    Progression of Symptoms Better      Oculomotor Exam   Ocular ROM normal    Spontaneous Absent    Gaze-induced  Absent    Head shaking Horizontal Absent   c/o of slight dizziness   Head Shaking Vertical Absent   c/o slight dizziness   Smooth Pursuits Saccades   to RT; no saccades noted to left but c/o dizziness; dizziness going down slight sx going up.   Saccades Intact;Comment    Comment saccades slightly dizzy      Oculomotor Exam-Fixation Suppressed    Spontaneous Nystagmus none      Vestibulo-Ocular Reflex   VOR 1 Head Only (x 1 viewing) negative    VOR 2 Head and Object (x 2 viewing) negative      Positional Testing    Dix-Hallpike Dix-Hallpike Right;Dix-Hallpike Left      Dix-Hallpike Right   Dix-Hallpike Right Symptoms No nystagmus      Dix-Hallpike Left   Dix-Hallpike Left Symptoms No nystagmus      Positional Sensitivities   Sit to Supine No dizziness    Supine to Left Side No dizziness    Up from Left Hallpike No dizziness    Head Turning x 5 Mild dizziness    Head Nodding x 5 Mild dizziness    Rolling Right Mild dizziness    Rolling Left No dizziness              Objective measurements completed on examination: See above findings.        Vestibular Treatment/Exercise - 11/20/20 0001      Vestibular Treatment/Exercise   Habituation Exercises --   rolling Rt/Lt x 5; sitting nose to knee x 5   Gaze Exercises X1 Viewing Horizontal;X1 Viewing Vertical      X1 Viewing Horizontal   Foot Position floor    Reps 5      X1 Viewing Vertical   Foot Position floor    Reps 5              Balance Exercises - 11/20/20 0001      Balance Exercises: Standing   SLS Eyes open;3 reps             PT Education - 11/20/20 0957    Education Details HEP    Person(s) Educated Patient    Methods Explanation;Handout;Demonstration    Comprehension Verbalized understanding;Returned demonstration            PT Short Term Goals - 11/20/20 1002      PT SHORT TERM GOAL #1   Title Pt to be I in HEP to allow pt to state that dizziness has decreased 50%    Time 2    Period Weeks    Status New    Target Date 12/04/20             PT Long Term Goals - 11/20/20 1002      PT LONG TERM GOAL #1   Title PT to state that she has not had any dizziness in the past week  Time 4    Period Weeks    Status New    Target Date 12/18/20                  Plan - 11/20/20 0958    Clinical Impression Statement [name] has been experiencing dizziness at least 10x a day for the past month.  She states her sx are improving at this point.  Dix-halpike manuever is negative bilaterally  therefore therapist does not feel this is BPPV but rather a central issue.  Mckenzie Cooley will benefit from skilled PT for habituation and balance exercises.    Examination-Activity Limitations Squat;Locomotion Level    Examination-Participation Restrictions Cleaning;Community Activity;Driving;Shop    Stability/Clinical Decision Making Stable/Uncomplicated    Clinical Decision Making Moderate    Rehab Potential Good    PT Frequency 1x / week    PT Duration 4 weeks    PT Treatment/Interventions ADLs/Self Care Home Management;Manual techniques;Patient/family education;Therapeutic activities;Therapeutic exercise;Balance training;Neuromuscular re-education    PT Next Visit Plan begin tandem stance with head turns, tandem and retro gait, 1/4 and 1/2 turns, marching on foam... progress HEP    PT Home Exercise Plan VOR 1, supine to side lying habituation B, sitting nose to knee B           Patient will benefit from skilled therapeutic intervention in order to improve the following deficits and impairments:  Dizziness  Visit Diagnosis: Dizziness and giddiness     Problem List Patient Active Problem List   Diagnosis Date Noted  . Bilateral carpal tunnel syndrome 11/06/2020  . Spotting 12/14/2018  . Positive urine pregnancy test 12/14/2018  . Carpal tunnel syndrome, left upper limb 06/01/2018  . Migraine 12/09/2017  . Iron deficiency 07/20/2017  . Viral upper respiratory tract infection 03/24/2017  . Allergic otitis media of both ears 03/24/2017  . History of severe pre-eclampsia 02/23/2017  . History of anemia 02/23/2017  . Previous cesarean section 02/09/2017  . H/O severe preeclampsia 02/09/2017  . Postoperative anemia due to acute blood loss 01/06/2017  . Smoker 09/27/2014  . Asthma 07/01/2011  . Gastroesophageal reflux disease 07/01/2011    Rayetta Humphrey, PT CLT [phone] 11/20/2020, 10:03 AM  [facility] [place], [place], [address] Phone: [phone]   Fax:  [phone]  Name: Mckenzie Cooley MRN: [id number] Date of Birth: [birth year]

## 2020-11-27 ENCOUNTER — Ambulatory Visit (HOSPITAL_COMMUNITY): Payer: Medicaid Other | Admitting: Physical Therapy

## 2020-11-27 ENCOUNTER — Encounter (HOSPITAL_COMMUNITY): Payer: Self-pay | Admitting: Physical Therapy

## 2020-11-27 ENCOUNTER — Other Ambulatory Visit: Payer: Self-pay

## 2020-11-27 DIAGNOSIS — R42 Dizziness and giddiness: Secondary | ICD-10-CM

## 2020-11-27 NOTE — Therapy (Signed)
[facility] [address] [place], [place], [address] Phone: [phone]   Fax:  [phone]  Physical Therapy Treatment  Patient Details  Name: Mckenzie Cooley MRN: [id number] Date of Birth: 01-15-1991 Referring Provider (PT): Marjorie Smolder   Encounter Date: 11/27/2020   PT End of Session - 11/27/20 0834    Visit Number 2    Number of Visits 4    Date for PT Re-Evaluation 12/20/20    Authorization Type medicar healthy    PT Start Time 0834    PT Stop Time 0912    PT Time Calculation (min) 38 min    Activity Tolerance Patient tolerated treatment well    Behavior During Therapy [facility] for tasks assessed/performed           Past Medical History:  Diagnosis Date  . Acid reflux   . Anemia   . Bloating   . Childhood asthma   . History of blood transfusion   . PCOS (polycystic ovarian syndrome) 09/27/2014  . Preeclampsia   . Seasonal allergies     Past Surgical History:  Procedure Laterality Date  . CESAREAN SECTION N/A 01/02/2017   Procedure: CESAREAN SECTION;  Surgeon: Chancy Milroy, MD;  Location: [facility];  Service: Obstetrics;  Laterality: N/A;  . CHOLECYSTECTOMY  2011   Dr.Byerly  . ESOPHAGOGASTRODUODENOSCOPY N/A 10/26/2014   Procedure: ESOPHAGOGASTRODUODENOSCOPY (EGD);  Surgeon: Rogene Houston, MD;  [facility];  Service: Endoscopy;  Laterality: N/A;  . UPPER GASTROINTESTINAL ENDOSCOPY  12/05/2009  . UPPER GASTROINTESTINAL ENDOSCOPY  02/07/2009  . WISDOM TOOTH EXTRACTION    . WISDOM TOOTH EXTRACTION      There were no vitals filed for this visit.   Subjective Assessment - 11/27/20 0836    Subjective States her symptoms are getting better. Reports no difficulty with her exercises. States she currently has 3/10 dizziness.    Pertinent History B carpal tunnel, B knee pain    Currently in Pain? Yes    Pain Score 3    dizziness             [facility] PT Assessment - 11/27/20 0001      Assessment    Medical Diagnosis dizziness    Referring Provider (PT) Marjorie Smolder                              Balance Exercises - 11/27/20 0001      Balance Exercises: Standing   Tandem Stance Eyes open;5 reps   with head turns x1 bilateral, then x2 on towel bilateral   Other Standing Exercises marching on blue foam 44x10 alternating legs    Other Standing Exercises Comments 1/4 turns x2 complete turns bilateral--> 1/2 turns 2x5 bilateral (with rest breaks in between reps             PT Education - 11/27/20 0909    Education Details on HEP and on taking rest breaks when symptoms present    Person(s) Educated Patient    Methods Explanation    Comprehension Verbalized understanding            PT Short Term Goals - 11/20/20 1002      PT SHORT TERM GOAL #1   Title Pt to be I in HEP to allow pt to state that dizziness has decreased 50%    Time 2    Period Weeks    Status New  Target Date 12/04/20             PT Long Term Goals - 11/20/20 1002      PT LONG TERM GOAL #1   Title PT to state that she has not had any dizziness in the past week    Time 4    Period Weeks    Status New    Target Date 12/18/20                 Plan - 11/27/20 0834    Clinical Impression Statement No symptoms noted with  turns but increased symptoms noted with  turns. Instructed patient to rest when symptoms present to return them to baseline. Slight nausea noted with  turns, allowed for 2 minute rest break. Rest breaks taken intermittently throughout session. Will continue with current POC as tolerated.    Examination-Activity Limitations Squat;Locomotion Level    Examination-Participation Restrictions Cleaning;Community Activity;Driving;Shop    Stability/Clinical Decision Making Stable/Uncomplicated    Rehab Potential Good    PT Frequency 1x / week    PT Duration 4 weeks    PT Treatment/Interventions ADLs/Self Care Home Management;Manual techniques;Patient/family  education;Therapeutic activities;Therapeutic exercise;Balance training;Neuromuscular re-education    PT Next Visit Plan tandem and retro gait, habituation and balance exercises... progress HEP    PT Home Exercise Plan VOR 1, supine to side lying habituation B, sitting nose to knee B; 6/8 1/2 turns, tandem head rotation           Patient will benefit from skilled therapeutic intervention in order to improve the following deficits and impairments:  Dizziness  Visit Diagnosis: Dizziness and giddiness     Problem List Patient Active Problem List   Diagnosis Date Noted  . Bilateral carpal tunnel syndrome 11/06/2020  . Spotting 12/14/2018  . Positive urine pregnancy test 12/14/2018  . Carpal tunnel syndrome, left upper limb 06/01/2018  . Migraine 12/09/2017  . Iron deficiency 07/20/2017  . Viral upper respiratory tract infection 03/24/2017  . Allergic otitis media of both ears 03/24/2017  . History of severe pre-eclampsia 02/23/2017  . History of anemia 02/23/2017  . Previous cesarean section 02/09/2017  . H/O severe preeclampsia 02/09/2017  . Postoperative anemia due to acute blood loss 01/06/2017  . Smoker 09/27/2014  . Asthma 07/01/2011  . Gastroesophageal reflux disease 07/01/2011   9:13 AM, 11/27/20 [name], DPT Physical Therapy with [facility]  [phone] office  [facility] [address] [place], [place], [address] Phone: [phone]   Fax:  [phone]  Name: LEIGH BLAS MRN: [id number] Date of Birth: [birth year]

## 2020-12-05 ENCOUNTER — Ambulatory Visit (HOSPITAL_COMMUNITY): Payer: Medicaid Other | Admitting: Physical Therapy

## 2020-12-05 ENCOUNTER — Encounter (HOSPITAL_COMMUNITY): Payer: Self-pay | Admitting: Physical Therapy

## 2020-12-05 ENCOUNTER — Other Ambulatory Visit: Payer: Self-pay

## 2020-12-05 DIAGNOSIS — R42 Dizziness and giddiness: Secondary | ICD-10-CM | POA: Diagnosis not present

## 2020-12-05 NOTE — Therapy (Signed)
[facility] [place], [place], [address] Phone: [phone]   Fax:  [phone]  Physical Therapy Treatment  Patient Details  Name: Mckenzie Cooley MRN: [id number] Date of Birth: 1990-07-30 Referring Provider (PT): Marjorie Smolder   Encounter Date: 12/05/2020   PT End of Session - 12/05/20 0826     Visit Number 3    Number of Visits 4    Date for PT Re-Evaluation 12/20/20    Authorization Type medicaid healthyblue: apprived 4 visits from 6/2 to 6/30    Authorization - Visit Number 2    Authorization - Number of Visits 4    PT Start Time [id number]    PT Stop Time 0910    PT Time Calculation (min) 38 min    Activity Tolerance Patient tolerated treatment well    Behavior During Therapy [facility] for tasks assessed/performed             Past Medical History:  Diagnosis Date   Acid reflux    Anemia    Bloating    Childhood asthma    History of blood transfusion    PCOS (polycystic ovarian syndrome) 09/27/2014   Preeclampsia    Seasonal allergies     Past Surgical History:  Procedure Laterality Date   CESAREAN SECTION N/A 01/02/2017   Procedure: CESAREAN SECTION;  Surgeon: Chancy Milroy, MD;  Location: [facility];  Service: Obstetrics;  Laterality: N/A;   CHOLECYSTECTOMY  2011   Dr.Byerly   ESOPHAGOGASTRODUODENOSCOPY N/A 10/26/2014   Procedure: ESOPHAGOGASTRODUODENOSCOPY (EGD);  Surgeon: Rogene Houston, MD;  [facility];  Service: Endoscopy;  Laterality: N/A;   UPPER GASTROINTESTINAL ENDOSCOPY  12/05/2009   UPPER GASTROINTESTINAL ENDOSCOPY  02/07/2009   WISDOM TOOTH EXTRACTION     WISDOM TOOTH EXTRACTION      There were no vitals filed for this visit.   Subjective Assessment - 12/05/20 0833     Subjective States that she is feeling better it is just with fast movement that her symptoms present.    Pertinent History B carpal tunnel, B knee pain    Currently in Pain? No/denies                 [facility] PT Assessment - 12/05/20 0001       Assessment   Medical Diagnosis dizziness    Referring Provider (PT) Tiffany Lilia Pro                           [address] [facility] Adult PT Treatment/Exercise - 12/05/20 0001       Exercises   Exercises Knee/Hip      Knee/Hip Exercises: Standing   Forward Step Up 3 sets;5 sets;Both;Hand Hold: 0   on pink dyno disc   Other Standing Knee Exercises standing on pink dyno disc x15" holds x6; backwards walking 2x5 20 feet; narrow base of support on floor x30" holds EC --> on black foam x2 30" holds    Other Standing Knee Exercises tandem walking x5 of 20 feet             Vestibular Treatment/Exercise - 12/05/20 0001       Vestibular Treatment/Exercise   Habituation Exercises Comment   1/2 turns Bilateral quick - x5                    PT Short Term Goals - 11/20/20 1002       PT SHORT  TERM GOAL #1   Title Pt to be I in HEP to allow pt to state that dizziness has decreased 50%    Time 2    Period Weeks    Status New    Target Date 12/04/20               PT Long Term Goals - 11/20/20 1002       PT LONG TERM GOAL #1   Title PT to state that she has not had any dizziness in the past week    Time 4    Period Weeks    Status New    Target Date 12/18/20                   Plan - 12/05/20 0827     Clinical Impression Statement Intermittent foot/leg symptoms presented during session which required rest breaks but overall dizziness symptoms have improved. Improved ability to perform  turns in clinic today and with greater speed. Overall balance and dizziness symptoms are significantly improved. Anticipate DC from PT next session pending patient presentation.    Examination-Activity Limitations Squat;Locomotion Level    Examination-Participation Restrictions Cleaning;Community Activity;Driving;Shop    Stability/Clinical Decision Making Stable/Uncomplicated    Rehab Potential Good    PT Frequency  1x / week    PT Duration 4 weeks    PT Treatment/Interventions ADLs/Self Care Home Management;Manual techniques;Patient/family education;Therapeutic activities;Therapeutic exercise;Balance training;Neuromuscular re-education    PT Next Visit Plan tandem and retro gait, habituation and balance exercises... progress HEP    PT Home Exercise Plan VOR 1, supine to side lying habituation B, sitting nose to knee B; 6/8 1/2 turns, tandem head rotation; bbackwards walking, EC balance             Patient will benefit from skilled therapeutic intervention in order to improve the following deficits and impairments:  Dizziness  Visit Diagnosis: Dizziness and giddiness     Problem List Patient Active Problem List   Diagnosis Date Noted   Bilateral carpal tunnel syndrome 11/06/2020   Spotting 12/14/2018   Positive urine pregnancy test 12/14/2018   Carpal tunnel syndrome, left upper limb 06/01/2018   Migraine 12/09/2017   Iron deficiency 07/20/2017   Viral upper respiratory tract infection 03/24/2017   Allergic otitis media of both ears 03/24/2017   History of severe pre-eclampsia 02/23/2017   History of anemia 02/23/2017   Previous cesarean section 02/09/2017   H/O severe preeclampsia 02/09/2017   Postoperative anemia due to acute blood loss 01/06/2017   Smoker 09/27/2014   Asthma 07/01/2011   Gastroesophageal reflux disease 07/01/2011    9:11 AM, 12/05/20 [name], DPT Physical Therapy with [facility]  [phone] office   [facility] [address] [place], [place], [address] Phone: [phone]   Fax:  [phone]  Name: Mckenzie Cooley MRN: [id number] Date of Birth: [birth year]

## 2020-12-11 ENCOUNTER — Ambulatory Visit (HOSPITAL_COMMUNITY): Payer: Medicaid Other | Admitting: Physical Therapy

## 2020-12-11 ENCOUNTER — Encounter (HOSPITAL_COMMUNITY): Payer: Self-pay | Admitting: Physical Therapy

## 2020-12-11 ENCOUNTER — Other Ambulatory Visit: Payer: Self-pay

## 2020-12-11 DIAGNOSIS — R42 Dizziness and giddiness: Secondary | ICD-10-CM | POA: Diagnosis not present

## 2020-12-11 NOTE — Therapy (Signed)
[facility] [address] [place], [place], [address] Phone: [phone]   Fax:  [phone]  Physical Therapy Treatment and Discharge Note  Patient Details  Name: Mckenzie Cooley MRN: [id number] Date of Birth: 1991-03-17 Referring Provider (PT): Marjorie Smolder  PHYSICAL THERAPY DISCHARGE SUMMARY  Visits from Start of Care: 4  Current functional level related to goals / functional outcomes: See below   Remaining deficits: See below   Education / Equipment: see below  Patient agrees to discharge. Patient goals were partially met. Patient is being discharged due to being pleased with the current functional level.   Encounter Date: 12/11/2020   PT End of Session - 12/11/20 0748     Visit Number 4    Number of Visits 4    Date for PT Re-Evaluation 12/20/20    Authorization Type medicaid healthyblue: apprived 4 visits from 6/2 to 6/30    Authorization - Visit Number 3    Authorization - Number of Visits 4    PT Start Time 2426    PT Stop Time 0815    PT Time Calculation (min) 28 min    Activity Tolerance Patient tolerated treatment well    Behavior During Therapy [facility] for tasks assessed/performed             Past Medical History:  Diagnosis Date   Acid reflux    Anemia    Bloating    Childhood asthma    History of blood transfusion    PCOS (polycystic ovarian syndrome) 09/27/2014   Preeclampsia    Seasonal allergies     Past Surgical History:  Procedure Laterality Date   CESAREAN SECTION N/A 01/02/2017   Procedure: CESAREAN SECTION;  Surgeon: Chancy Milroy, MD;  Location: [facility];  Service: Obstetrics;  Laterality: N/A;   CHOLECYSTECTOMY  2011   Dr.Byerly   ESOPHAGOGASTRODUODENOSCOPY N/A 10/26/2014   Procedure: ESOPHAGOGASTRODUODENOSCOPY (EGD);  Surgeon: Rogene Houston, MD;  [facility];  Service: Endoscopy;  Laterality: N/A;   UPPER GASTROINTESTINAL ENDOSCOPY  12/05/2009   UPPER  GASTROINTESTINAL ENDOSCOPY  02/07/2009   WISDOM TOOTH EXTRACTION     WISDOM TOOTH EXTRACTION      There were no vitals filed for this visit.       [facility] PT Assessment - 12/11/20 0001       Assessment   Medical Diagnosis dizziness    Referring Provider (PT) Marjorie Smolder                           [facility] PT Treatment/Exercise - 12/11/20 0001       Knee/Hip Exercises: Standing   Other Standing Knee Exercises backwards walking x5 30 feet - quickly; lateral stepping iwht cervical nods x5 bilaterally    Other Standing Knee Exercises tandem walking on line looking straight ahead with increased speed x5 of 30 feet                    PT Education - 12/11/20 0755     Education Details on HEP, importance of continuing exercises and current presetnation.    Person(s) Educated Patient    Methods Explanation    Comprehension Verbalized understanding              PT Short Term Goals - 12/11/20 0751       PT SHORT TERM GOAL #1   Title Pt to be I in HEP to  allow pt to state that dizziness has decreased 50%    Time 2    Period Weeks    Status Achieved    Target Date 12/04/20               PT Long Term Goals - 12/11/20 0753       PT LONG TERM GOAL #1   Title PT to state that she has not had any dizziness in the past week    Time 4    Period Weeks    Status Partially Met                   Plan - 12/11/20 0810     Clinical Impression Statement Overall patient is progressing well and is no longer having constant dizziness but more occasional episodes. She is doing her exercises but this week has been challenging since she has been busy. Reviewed HEP and answered all questions. Patient to discharge from PT to HEP at this time secondary to progress made and independence in HEP.    Examination-Activity Limitations Squat;Locomotion Level    Examination-Participation Restrictions Cleaning;Community Activity;Driving;Shop     Stability/Clinical Decision Making Stable/Uncomplicated    Rehab Potential Good    PT Frequency 1x / week    PT Duration 4 weeks    PT Treatment/Interventions ADLs/Self Care Home Management;Manual techniques;Patient/family education;Therapeutic activities;Therapeutic exercise;Balance training;Neuromuscular re-education    PT Next Visit Plan DC to HEP    PT Home Exercise Plan VOR 1, supine to side lying habituation B, sitting nose to knee B; 6/8 1/2 turns, tandem head rotation; bbackwards walking, EC balance             Patient will benefit from skilled therapeutic intervention in order to improve the following deficits and impairments:  Dizziness  Visit Diagnosis: Dizziness and giddiness     Problem List Patient Active Problem List   Diagnosis Date Noted   Bilateral carpal tunnel syndrome 11/06/2020   Spotting 12/14/2018   Positive urine pregnancy test 12/14/2018   Carpal tunnel syndrome, left upper limb 06/01/2018   Migraine 12/09/2017   Iron deficiency 07/20/2017   Viral upper respiratory tract infection 03/24/2017   Allergic otitis media of both ears 03/24/2017   History of severe pre-eclampsia 02/23/2017   History of anemia 02/23/2017   Previous cesarean section 02/09/2017   H/O severe preeclampsia 02/09/2017   Postoperative anemia due to acute blood loss 01/06/2017   Smoker 09/27/2014   Asthma 07/01/2011   Gastroesophageal reflux disease 07/01/2011    8:27 AM, 12/11/20 [name], DPT Physical Therapy with [facility]  [phone] office   [facility] [address] [place], [place], [address] Phone: [phone]   Fax:  [phone]  Name: Mckenzie Cooley MRN: [id number] Date of Birth: [birth year]

## 2020-12-19 ENCOUNTER — Encounter (HOSPITAL_COMMUNITY): Payer: Medicaid Other | Admitting: Physical Therapy

## 2020-12-25 ENCOUNTER — Encounter: Payer: Self-pay | Admitting: Family Medicine

## 2020-12-25 ENCOUNTER — Ambulatory Visit: Payer: Medicaid Other | Admitting: Family Medicine

## 2020-12-25 ENCOUNTER — Other Ambulatory Visit: Payer: Self-pay

## 2020-12-25 VITALS — BP 109/69 | HR 77 | Temp 97.7°F | Ht 60.0 in | Wt 234.1 lb

## 2020-12-25 DIAGNOSIS — R1030 Lower abdominal pain, unspecified: Secondary | ICD-10-CM

## 2020-12-25 DIAGNOSIS — K219 Gastro-esophageal reflux disease without esophagitis: Secondary | ICD-10-CM

## 2020-12-25 DIAGNOSIS — N946 Dysmenorrhea, unspecified: Secondary | ICD-10-CM | POA: Diagnosis not present

## 2020-12-25 DIAGNOSIS — K649 Unspecified hemorrhoids: Secondary | ICD-10-CM

## 2020-12-25 LAB — PREGNANCY, URINE: Preg Test, Ur: NEGATIVE

## 2020-12-25 LAB — URINALYSIS, ROUTINE W REFLEX MICROSCOPIC
Bilirubin, UA: NEGATIVE
Glucose, UA: NEGATIVE
Ketones, UA: NEGATIVE
Leukocytes,UA: NEGATIVE
Nitrite, UA: NEGATIVE
Protein,UA: NEGATIVE
RBC, UA: NEGATIVE
Specific Gravity, UA: >1.03 — ABNORMAL HIGH (ref 1.005–1.030)
Urobilinogen, Ur: 0.2 mg/dL (ref 0.2–1.0)
pH, UA: 5.5 (ref 5.0–7.5)

## 2020-12-25 MED ORDER — HYDROCORTISONE ACETATE 25 MG RE SUPP: 25.0000 mg | Freq: Two times a day (BID) | RECTAL | 0 refills | Status: DC

## 2020-12-25 MED ORDER — OMEPRAZOLE 20 MG PO CPDR: 20.0000 mg | DELAYED_RELEASE_CAPSULE | Freq: Every day | ORAL | 0 refills | Status: DC

## 2020-12-25 NOTE — Patient Instructions (Signed)
Hemorrhoids Hemorrhoids are swollen veins in and around the rectum or anus. There are two types of hemorrhoids: Internal hemorrhoids. These occur in the veins that are just inside the rectum. They may poke through to the outside and become irritated and painful. External hemorrhoids. These occur in the veins that are outside the anus and can be felt as a painful swelling or hard lump near the anus. Most hemorrhoids do not cause serious problems, and they can be managed with home treatments such as diet and lifestyle changes. If home treatments do nothelp the symptoms, procedures can be done to shrink or remove the hemorrhoids. What are the causes? This condition is caused by increased pressure in the anal area. This pressure may result from various things, including: Constipation. Straining to have a bowel movement. Diarrhea. Pregnancy. Obesity. Sitting for long periods of time. Heavy lifting or other activity that causes you to strain. Anal sex. Riding a bike for a long period of time. What are the signs or symptoms? Symptoms of this condition include: Pain. Anal itching or irritation. Rectal bleeding. Leakage of stool (feces). Anal swelling. One or more lumps around the anus. How is this diagnosed? This condition can often be diagnosed through a visual exam. Other exams or tests may also be done, such as: An exam that involves feeling the rectal area with a gloved hand (digital rectal exam). An exam of the anal canal that is done using a small tube (anoscope). A blood test, if you have lost a significant amount of blood. A test to look inside the colon using a flexible tube with a camera on the end (sigmoidoscopy or colonoscopy). How is this treated? This condition can usually be treated at home. However, various procedures may be done if dietary changes, lifestyle changes, and other home treatments do not help your symptoms. These procedures can help make the hemorrhoids smaller or  remove them completely. Some of these procedures involve surgery, and others do not. Common procedures include: Rubber band ligation. Rubber bands are placed at the base of the hemorrhoids to cut off their blood supply. Sclerotherapy. Medicine is injected into the hemorrhoids to shrink them. Infrared coagulation. A type of light energy is used to get rid of the hemorrhoids. Hemorrhoidectomy surgery. The hemorrhoids are surgically removed, and the veins that supply them are tied off. Stapled hemorrhoidopexy surgery. The surgeon staples the base of the hemorrhoid to the rectal wall. Follow these instructions at home: Eating and drinking  Eat foods that have a lot of fiber in them, such as whole grains, beans, nuts, fruits, and vegetables. Ask your health care provider about taking products that have added fiber (fiber supplements). Reduce the amount of fat in your diet. You can do this by eating low-fat dairy products, eating less red meat, and avoiding processed foods. Drink enough fluid to keep your urine pale yellow.  Managing pain and swelling  Take warm sitz baths for 20 minutes, 3-4 times a day to ease pain and discomfort. You may do this in a bathtub or using a portable sitz bath that fits over the toilet. If directed, apply ice to the affected area. Using ice packs between sitz baths may be helpful. Put ice in a plastic bag. Place a towel between your skin and the bag. Leave the ice on for 20 minutes, 2-3 times a day.  General instructions Take over-the-counter and prescription medicines only as told by your health care provider. Use medicated creams or suppositories as told. Get regular  exercise. Ask your health care provider how much and what kind of exercise is best for you. In general, you should do moderate exercise for at least 30 minutes on most days of the week (150 minutes each week). This can include activities such as walking, biking, or yoga. Go to the bathroom when you  have the urge to have a bowel movement. Do not wait. Avoid straining to have bowel movements. Keep the anal area dry and clean. Use wet toilet paper or moist towelettes after a bowel movement. Do not sit on the toilet for long periods of time. This increases blood pooling and pain. Keep all follow-up visits as told by your health care provider. This is important. Contact a health care provider if you have: Increasing pain and swelling that are not controlled by treatment or medicine. Difficulty having a bowel movement, or you are unable to have a bowel movement. Pain or inflammation outside the area of the hemorrhoids. Get help right away if you have: Uncontrolled bleeding from your rectum. Summary Hemorrhoids are swollen veins in and around the rectum or anus. Most hemorrhoids can be managed with home treatments such as diet and lifestyle changes. Taking warm sitz baths can help ease pain and discomfort. In severe cases, procedures or surgery can be done to shrink or remove the hemorrhoids. This information is not intended to replace advice given to you by your health care provider. Make sure you discuss any questions you have with your healthcare provider. Document Revised: 11/04/2018 Document Reviewed: 10/28/2017 Elsevier Patient Education  [place]..  Food Choices for Gastroesophageal Reflux Disease, Adult When you have gastroesophageal reflux disease (GERD), the foods you eat and your eating habits are very important. Choosing the right foods can help ease the discomfort of GERD. Consider working with a dietitian to help you [employer] choices. What are tips for following this plan? Reading food labels Look for foods that are low in saturated fat. Foods that have less than 5% of daily value (DV) of fat and 0 g of trans fats may help with your symptoms. Cooking Cook foods using methods other than frying. This may include baking, steaming, grilling, or broiling. These are  all methods that do not need a lot of fat for cooking. To add flavor, try to use herbs that are low in spice and acidity. Meal planning  Choose healthy foods that are low in fat, such as fruits, vegetables, whole grains, low-fat dairy products, lean meats, fish, and poultry. Eat frequent, small meals instead of three large meals each day. Eat your meals slowly, in a relaxed setting. Avoid bending over or lying down until 2-3 hours after eating. Limit high-fat foods such as fatty meats or fried foods. Limit your intake of fatty foods, such as oils, butter, and shortening. Avoid the following as told by your health care provider: Foods that cause symptoms. These may be different for different people. Keep a food diary to keep track of foods that cause symptoms. Alcohol. Drinking large amounts of liquid with meals. Eating meals during the 2-3 hours before bed.  Lifestyle Maintain a healthy weight. Ask your health care provider what weight is healthy for you. If you need to lose weight, work with your health care provider to do so safely. Exercise for at least 30 minutes on 5 or more days each week, or as told by your health care provider. Avoid wearing clothes that fit tightly around your waist and chest. Do not use any products  that contain nicotine or tobacco. These products include cigarettes, chewing tobacco, and vaping devices, such as e-cigarettes. If you need help quitting, ask your health care provider. Sleep with the head of your bed raised. Use a wedge under the mattress or blocks under the bed frame to raise the head of the bed. Chew sugar-free gum after mealtimes. What foods should I eat?  Eat a healthy, well-balanced diet of fruits, vegetables, whole grains, low-fat dairy products, lean meats, fish, and poultry. Each person is different. Foods that may trigger symptoms in one person may not trigger any symptoms in another person. Work with your health care provider to identify foods  that are safe foryou. The items listed above may not be a complete list of recommended foods and beverages. Contact a dietitian for more information. What foods should I avoid? Limiting some of these foods may help manage the symptoms of GERD. Everyone is different. Consult a dietitian or your health care provider to help youidentify the exact foods to avoid, if any. Fruits Any fruits prepared with added fat. Any fruits that cause symptoms. For some people this may include citrus fruits, such as oranges, grapefruit, pineapple,and lemons. Vegetables Deep-fried vegetables. [place] fries. Any vegetables prepared with added fat. Any vegetables that cause symptoms. For some people, this may include tomatoesand tomato products, chili peppers, onions and garlic, and horseradish. Grains Pastries or quick breads with added fat. Meats and other proteins High-fat meats, such as fatty beef or pork, hot dogs, ribs, ham, sausage, salami, and bacon. Fried meat or protein, including fried fish and friedchicken. Nuts and nut butters, in large amounts. Dairy Whole milk and chocolate milk. Sour cream. Cream. Ice cream. Cream cheese.Milkshakes. Fats and oils Butter. Margarine. Shortening. Ghee. Beverages Coffee and tea, with or without caffeine. Carbonated beverages. Sodas. Energy drinks. Fruit juice made with acidic fruits, such as orange or grapefruit.Tomato juice. Alcoholic drinks. Sweets and desserts Chocolate and cocoa. Donuts. Seasonings and condiments Pepper. Peppermint and spearmint. Added salt. Any condiments, herbs, or seasonings that cause symptoms. For some people, this may include curry, hotsauce, or vinegar-based salad dressings. The items listed above may not be a complete list of foods and beverages to avoid. Contact a dietitian for more information. Questions to ask your health care provider Diet and lifestyle changes are usually the first steps that are taken to manage symptoms of GERD. If diet  and lifestyle changes do not improve your symptoms,talk with your health care provider about taking medicines. Where to find more information International Foundation for Gastrointestinal Disorders: aboutgerd.org Summary When you have gastroesophageal reflux disease (GERD), food and lifestyle choices may be very helpful in easing the discomfort of GERD. Eat frequent, small meals instead of three large meals each day. Eat your meals slowly, in a relaxed setting. Avoid bending over or lying down until 2-3 hours after eating. Limit high-fat foods such as fatty meats or fried foods. This information is not intended to replace advice given to you by your health care provider. Make sure you discuss any questions you have with your healthcare provider. Document Revised: 12/18/2019 Document Reviewed: 12/18/2019 Elsevier Patient Education  [place].

## 2020-12-25 NOTE — Progress Notes (Signed)
Acute Office Visit  Subjective:    Patient ID: Mckenzie Cooley, female    DOB: 06-Apr-1991, 30 y.o.   MRN: [id number]  Chief Complaint  Patient presents with   Stool Color Change   Abdominal Pain    HPI Patient is in today for lower abdominal pain and changes in stool color for about 1 week. She reports areas of orange/light red present in her stool. This has happened once a day or so. She has 2-3 bowel movements a day. She did see some bright red blood with wiping once. She does have rectal pain a few times in the last week. Denies rectal itching. BMs are easy to pass and denies straining. Denies changes in diet, medications, or supplements.   Reports abdominal pain is epigastric and feels like a soreness. She hasn't paid attention to see if pain is affected by food. The pain is intermittent and is worse with lying down and at night. She has nausea often at night. She has tried peppermint with some short term improvement. This has been going for some time. She has a history of acid reflux and takes zantac prn. Denies vomiting, weight loss, fever, chills, or dysuria.  Past Medical History:  Diagnosis Date   Acid reflux    Anemia    Bloating    Childhood asthma    History of blood transfusion    PCOS (polycystic ovarian syndrome) 09/27/2014   Preeclampsia    Seasonal allergies     Past Surgical History:  Procedure Laterality Date   CESAREAN SECTION N/A 01/02/2017   Procedure: CESAREAN SECTION;  Surgeon: Chancy Milroy, MD;  Location: [facility];  Service: Obstetrics;  Laterality: N/A;   CHOLECYSTECTOMY  2011   Dr.Byerly   ESOPHAGOGASTRODUODENOSCOPY N/A 10/26/2014   Procedure: ESOPHAGOGASTRODUODENOSCOPY (EGD);  Surgeon: Rogene Houston, MD;  [facility];  Service: Endoscopy;  Laterality: N/A;   UPPER GASTROINTESTINAL ENDOSCOPY  12/05/2009   UPPER GASTROINTESTINAL ENDOSCOPY  02/07/2009   WISDOM TOOTH EXTRACTION     WISDOM TOOTH EXTRACTION      Family History   Problem Relation Age of Onset   Hypertension Mother    Heart disease Father    Cancer - Cervical Maternal Aunt    Kidney disease Maternal Grandmother    Renal Disease Maternal Grandmother     Social History   Socioeconomic History   Marital status: Married    Spouse name: Not on file   Number of children: 1   Years of education: Not on file   Highest education level: Not on file  Occupational History   Not on file  Tobacco Use   Smoking status: Former    Packs/day: 0.50    Years: 12.00    Pack years: 6.00    Types: Cigarettes    Quit date: 05/05/2016    Years since quitting: 4.6   Smokeless tobacco: Never  Vaping Use   Vaping Use: Never used  Substance and Sexual Activity   Alcohol use: No    Alcohol/week: 0.0 standard drinks   Drug use: No   Sexual activity: Yes    Birth control/protection: None  Other Topics Concern   Not on file  Social History Narrative   Not on file   Social Determinants of Health   Financial Resource Strain: Not on file  Food Insecurity: Not on file  Transportation Needs: Not on file  Physical Activity: Not on file  Stress: Not on file  Social Connections: Not  on file  Intimate Partner Violence: Not on file    Outpatient Medications Prior to Visit  Medication Sig Dispense Refill   acetaminophen (TYLENOL) 500 MG tablet Take 1,000 mg by mouth daily as needed.     meclizine (ANTIVERT) 12.5 MG tablet Take 1-2 tablets (12.5-25 mg total) by mouth 3 (three) times daily as needed for dizziness. 30 tablet 0   No facility-administered medications prior to visit.    Allergies  Allergen Reactions   Percocet [Oxycodone-Acetaminophen] Hives and Itching   Amoxicillin Rash    Has patient had a PCN reaction causing immediate rash, facial/tongue/throat swelling, SOB or lightheadedness with hypotension: Yes -mild rash Has patient had a PCN reaction causing severe rash involving mucus membranes or skin necrosis: No Has patient had a PCN reaction  that required hospitalization: No Has patient had a PCN reaction occurring within the last 10 years: Yes If all of the above answers are "NO", then may proceed with Cephalosporin use. Has tolerated ceftriaxone & cephalexin     Review of Systems As per HPI.     Objective:    Physical Exam Vitals and nursing note reviewed.  Constitutional:      General: She is not in acute distress.    Appearance: She is not ill-appearing, toxic-appearing or diaphoretic.  Cardiovascular:     Rate and Rhythm: Normal rate and regular rhythm.  Pulmonary:     Effort: Pulmonary effort is normal. No respiratory distress.     Breath sounds: Normal breath sounds.  Abdominal:     General: Bowel sounds are normal. There is no distension.     Palpations: Abdomen is soft. There is no shifting dullness, fluid wave or mass.     Tenderness: There is abdominal tenderness in the epigastric area. There is no right CVA tenderness, left CVA tenderness, guarding or rebound. Negative signs include Murphy's sign, Rovsing's sign and McBurney's sign.     Hernia: No hernia is present.  Genitourinary:    Rectum: External hemorrhoid and internal hemorrhoid present. No mass, tenderness or anal fissure. Normal anal tone.  Skin:    General: Skin is warm and dry.  Neurological:     General: No focal deficit present.     Mental Status: She is alert and oriented to person, place, and time.  Psychiatric:        Mood and Affect: Mood normal.        Behavior: Behavior normal.    BP 109/69   Pulse 77   Temp 97.7 F (36.5 C) (Oral)   Ht 5' (1.524 m)   Wt 234 lb 2 oz (106.2 kg)   BMI 45.72 kg/m  Wt Readings from Last 3 Encounters:  12/25/20 234 lb 2 oz (106.2 kg)  10/30/20 234 lb 4 oz (106.3 kg)  10/12/20 230 lb (104.3 kg)    Health Maintenance Due  Topic Date Due   Hepatitis C Screening  Never done   PAP SMEAR-Modifier  05/04/2020    There are no preventive care reminders to display for this patient.   Lab  Results  Component Value Date   TSH 1.150 10/30/2020   Lab Results  Component Value Date   WBC 9.9 10/30/2020   HGB 13.0 10/30/2020   HCT 41.6 10/30/2020   MCV 85 10/30/2020   PLT 289 10/30/2020   Lab Results  Component Value Date   NA 141 10/30/2020   K 4.4 10/30/2020   CO2 24 10/30/2020   GLUCOSE 101 (H) 10/30/2020  BUN 15 10/30/2020   CREATININE 0.86 10/30/2020   BILITOT 0.3 10/30/2020   ALKPHOS 73 10/30/2020   AST 20 10/30/2020   ALT 24 10/30/2020   PROT 6.8 10/30/2020   ALBUMIN 4.1 10/30/2020   CALCIUM 9.3 10/30/2020   ANIONGAP 8 10/12/2020   EGFR 93 10/30/2020   Lab Results  Component Value Date   CHOL 155 07/20/2017   Lab Results  Component Value Date   HDL 42 07/20/2017   Lab Results  Component Value Date   LDLCALC 96 07/20/2017   Lab Results  Component Value Date   TRIG 86 07/20/2017   Lab Results  Component Value Date   CHOLHDL 3.7 07/20/2017   Lab Results  Component Value Date   HGBA1C 5.2 08/20/2020       Assessment & Plan:   Terriah was seen today for stool color change and abdominal pain.  Diagnoses and all orders for this visit:  Hemorrhoids, unspecified hemorrhoid type Discussed likely cause of blood present with stool. Suppositories as below. Discussed diet and hydration. Handout given.  -     hydrocortisone (ANUSOL-HC) 25 MG suppository; Place 1 suppository (25 mg total) rectally 2 (two) times daily.  Gastroesophageal reflux disease without esophagitis Chronic, currently taking zantac but not well controlled. Start Prilosec as below. Discussed diet. Handout given.  -     omeprazole (PRILOSEC) 20 MG capsule; Take 1 capsule (20 mg total) by mouth daily.  Lower abdominal pain Negative pregnancy and UA today.  -     Pregnancy, urine -     Urinalysis, Routine w reflex microscopic  Return in about 6 weeks (around 02/05/2021) for follow up. Sooner for new or worsening symptoms, or if symptoms persist.   The patient indicates  understanding of these issues and agrees with the plan.    Gwenlyn Perking, FNP

## 2020-12-26 ENCOUNTER — Encounter: Payer: Self-pay | Admitting: Family Medicine

## 2020-12-30 ENCOUNTER — Encounter: Payer: Self-pay | Admitting: Adult Health

## 2020-12-30 ENCOUNTER — Ambulatory Visit: Payer: Medicaid Other | Admitting: Adult Health

## 2020-12-30 ENCOUNTER — Other Ambulatory Visit: Payer: Self-pay

## 2020-12-30 VITALS — BP 119/67 | HR 64 | Ht 60.0 in | Wt 237.0 lb

## 2020-12-30 DIAGNOSIS — Z319 Encounter for procreative management, unspecified: Secondary | ICD-10-CM

## 2020-12-30 DIAGNOSIS — N926 Irregular menstruation, unspecified: Secondary | ICD-10-CM | POA: Diagnosis not present

## 2020-12-30 LAB — POCT URINE PREGNANCY: Preg Test, Ur: NEGATIVE

## 2020-12-30 MED ORDER — LETROZOLE 2.5 MG PO TABS: ORAL_TABLET | ORAL | 2 refills | Status: DC

## 2020-12-30 MED ORDER — PRENATAL PLUS 27-1 MG PO TABS: 1.0000 | ORAL_TABLET | Freq: Every day | ORAL | 12 refills | Status: DC

## 2020-12-30 NOTE — Progress Notes (Signed)
  Subjective:     Patient ID: Mckenzie Cooley, female   DOB: 22-Jan-1991, 30 y.o.   MRN: [id number]  HPI [name] is a 30 year old white female,married, [id number] in to discuss ?endometriosis, wants to get pregnant.  PCP is Marjorie Smolder NP  Lab Results  Component Value Date   DIAGPAP  05/04/2017    NEGATIVE FOR INTRAEPITHELIAL LESIONS OR MALIGNANCY.    Review of Systems Has missed period +cramping Reviewed past medical,surgical, social and family history. Reviewed medications and allergies.     Objective:   Physical Exam BP 119/67 (BP Location: Left Arm, Patient Position: Sitting, Cuff Size: Normal)   Pulse 64   Ht 5' (1.524 m)   Wt 237 lb (107.5 kg)   LMP 11/27/2020   BMI 46.29 kg/m     UPT is negative Skin warm and dry. Lungs: clear to ausculation bilaterally. Cardiovascular: regular rate and rhythm.  Fall risk is low  Upstream - 12/30/20 1543       Pregnancy Intention Screening   Does the patient want to become pregnant in the next year? Yes    Does the patient's partner want to become pregnant in the next year? Yes    Would the patient like to discuss contraceptive options today? No      Contraception Wrap Up   Current Method Pregnant/Seeking Pregnancy    End Method Pregnant/Seeking Pregnancy    Contraception Counseling Provided No             Assessment:     1. Menstrual period late UPT is negative  2. Patient desires pregnancy Will rx PNV and femara Have sex every other day, days 3-7 of cycle  Meds ordered this encounter  Medications   prenatal vitamin w/FE, FA (PRENATAL 1 + 1) 27-1 MG TABS tablet    Sig: Take 1 tablet by mouth daily at 12 noon.    Dispense:  30 tablet    Refill:  12    Order Specific Question:   Supervising Provider    Answer:   Tania Ade H [[id number]]   letrozole ([place]) 2.5 MG tablet    Sig: Take days 3-7 of cycle    Dispense:  5 tablet    Refill:  2    Order Specific Question:   Supervising Provider    Answer:   Florian Buff  [[id number]]   Call me when period starts    Plan:     Follow up in 4 weeks for pap and physical

## 2021-01-13 ENCOUNTER — Other Ambulatory Visit: Payer: Self-pay | Admitting: Adult Health

## 2021-01-13 DIAGNOSIS — Z319 Encounter for procreative management, unspecified: Secondary | ICD-10-CM

## 2021-01-13 NOTE — Progress Notes (Signed)
Check progesterone level august 11

## 2021-01-16 ENCOUNTER — Encounter: Payer: Self-pay | Admitting: Family Medicine

## 2021-01-16 ENCOUNTER — Other Ambulatory Visit: Payer: Self-pay

## 2021-01-16 ENCOUNTER — Ambulatory Visit: Payer: Medicaid Other | Admitting: Family Medicine

## 2021-01-16 VITALS — BP 140/76 | HR 61 | Temp 97.4°F | Ht 60.0 in | Wt 236.0 lb

## 2021-01-16 DIAGNOSIS — N62 Hypertrophy of breast: Secondary | ICD-10-CM | POA: Diagnosis not present

## 2021-01-16 DIAGNOSIS — G8929 Other chronic pain: Secondary | ICD-10-CM | POA: Diagnosis not present

## 2021-01-16 DIAGNOSIS — M549 Dorsalgia, unspecified: Secondary | ICD-10-CM

## 2021-01-16 DIAGNOSIS — M25511 Pain in right shoulder: Secondary | ICD-10-CM | POA: Diagnosis not present

## 2021-01-16 DIAGNOSIS — M25512 Pain in left shoulder: Secondary | ICD-10-CM | POA: Diagnosis not present

## 2021-01-16 DIAGNOSIS — M542 Cervicalgia: Secondary | ICD-10-CM

## 2021-01-16 NOTE — Progress Notes (Signed)
Acute Office Visit  Subjective:    Patient ID: Mckenzie Cooley, female    DOB: [birth year], 30 y.o.   MRN: [id number]  Chief Complaint  Patient presents with   Back Pain        Shoulder Pain        Neck Pain   Discuss breast reduction     HPI Patient is in today for to discuss a breast reduction. She reports chronic shoulder, neck, and upper back pain for years. She reports that this improves significant when she holds up her breasts with her hands. She also reports that her headaches improve with lifting her breast. She is working to lose weight but she has to stop after short distances of walking due to her upper back pain. She is at least a cup size H and has indentations on her shoulders from her bra straps.   Past Medical History:  Diagnosis Date   Acid reflux    Anemia    Bloating    Childhood asthma    History of blood transfusion    PCOS (polycystic ovarian syndrome) 09/27/2014   Preeclampsia    Seasonal allergies     Past Surgical History:  Procedure Laterality Date   CESAREAN SECTION N/A 01/02/2017   Procedure: CESAREAN SECTION;  Surgeon: Chancy Milroy, MD;  Location: [place];  Service: Obstetrics;  Laterality: N/A;   CHOLECYSTECTOMY  2011   Dr.Byerly   ESOPHAGOGASTRODUODENOSCOPY N/A 10/26/2014   Procedure: ESOPHAGOGASTRODUODENOSCOPY (EGD);  Surgeon: Rogene Houston, MD;  [facility];  Service: Endoscopy;  Laterality: N/A;   UPPER GASTROINTESTINAL ENDOSCOPY  12/05/2009   UPPER GASTROINTESTINAL ENDOSCOPY  02/07/2009   WISDOM TOOTH EXTRACTION     WISDOM TOOTH EXTRACTION      Family History  Problem Relation Age of Onset   Hypertension Mother    Heart disease Father    Cancer - Cervical Maternal Aunt    Kidney disease Maternal Grandmother    Renal Disease Maternal Grandmother     Social History   Socioeconomic History   Marital status: Married    Spouse name: Not on file   Number of children: 1   Years of education: Not on file    Highest education level: Not on file  Occupational History   Not on file  Tobacco Use   Smoking status: Former    Packs/day: 0.50    Years: 12.00    Pack years: 6.00    Types: Cigarettes    Quit date: 05/05/2016    Years since quitting: 4.7   Smokeless tobacco: Never  Vaping Use   Vaping Use: Never used  Substance and Sexual Activity   Alcohol use: No    Alcohol/week: 0.0 standard drinks   Drug use: No   Sexual activity: Yes    Birth control/protection: None  Other Topics Concern   Not on file  Social History Narrative   Not on file   Social Determinants of Health   Financial Resource Strain: Not on file  Food Insecurity: Not on file  Transportation Needs: Not on file  Physical Activity: Not on file  Stress: Not on file  Social Connections: Not on file  Intimate Partner Violence: Not on file    Outpatient Medications Prior to Visit  Medication Sig Dispense Refill   acetaminophen (TYLENOL) 500 MG tablet Take 1,000 mg by mouth daily as needed.     letrozole (FEMARA) 2.5 MG tablet Take days 3-7 of cycle 5 tablet  2   prenatal vitamin w/FE, FA (PRENATAL 1 + 1) 27-1 MG TABS tablet Take 1 tablet by mouth daily at 12 noon. 30 tablet 12   ranitidine (ZANTAC) 150 MG capsule Take 150 mg by mouth 2 (two) times daily.     No facility-administered medications prior to visit.    Allergies  Allergen Reactions   Percocet [Oxycodone-Acetaminophen] Hives and Itching   Prilosec [Omeprazole] Other (See Comments)    Stomach cramp   Amoxicillin Rash    Has patient had a PCN reaction causing immediate rash, facial/tongue/throat swelling, SOB or lightheadedness with hypotension: Yes -mild rash Has patient had a PCN reaction causing severe rash involving mucus membranes or skin necrosis: No Has patient had a PCN reaction that required hospitalization: No Has patient had a PCN reaction occurring within the last 10 years: Yes If all of the above answers are "NO", then may proceed with  Cephalosporin use. Has tolerated ceftriaxone & cephalexin     Review of Systems As per HPI.     Objective:    Physical Exam Vitals and nursing note reviewed.  Constitutional:      General: She is not in acute distress.    Appearance: She is not ill-appearing, toxic-appearing or diaphoretic.  Pulmonary:     Effort: Pulmonary effort is normal. No respiratory distress.     Breath sounds: Normal breath sounds.  Musculoskeletal:     Right shoulder: No swelling, deformity, effusion, tenderness or bony tenderness.     Left shoulder: No swelling, deformity, effusion, tenderness or bony tenderness.     Cervical back: Neck supple. No swelling, deformity, erythema, tenderness or bony tenderness.     Right lower leg: No edema.     Left lower leg: No edema.  Skin:    General: Skin is warm and dry.  Neurological:     General: No focal deficit present.     Mental Status: She is alert and oriented to person, place, and time.  Psychiatric:        Mood and Affect: Mood normal.        Behavior: Behavior normal.    BP 140/76   Pulse 61   Temp (!) 97.4 F (36.3 C) (Temporal)   Ht 5' (1.524 m)   Wt 236 lb (107 kg)   LMP 11/27/2020   BMI 46.09 kg/m  Wt Readings from Last 3 Encounters:  01/16/21 236 lb (107 kg)  12/30/20 237 lb (107.5 kg)  12/25/20 234 lb 2 oz (106.2 kg)    Health Maintenance Due  Topic Date Due   Hepatitis C Screening  Never done   PAP SMEAR-Modifier  05/04/2020    There are no preventive care reminders to display for this patient.   Lab Results  Component Value Date   TSH 1.150 10/30/2020   Lab Results  Component Value Date   WBC 9.9 10/30/2020   HGB 13.0 10/30/2020   HCT 41.6 10/30/2020   MCV 85 10/30/2020   PLT 289 10/30/2020   Lab Results  Component Value Date   NA 141 10/30/2020   K 4.4 10/30/2020   CO2 24 10/30/2020   GLUCOSE 101 (H) 10/30/2020   BUN 15 10/30/2020   CREATININE 0.86 10/30/2020   BILITOT 0.3 10/30/2020   ALKPHOS 73  10/30/2020   AST 20 10/30/2020   ALT 24 10/30/2020   PROT 6.8 10/30/2020   ALBUMIN 4.1 10/30/2020   CALCIUM 9.3 10/30/2020   ANIONGAP 8 10/12/2020   EGFR 93 10/30/2020  Lab Results  Component Value Date   CHOL 155 07/20/2017   Lab Results  Component Value Date   HDL 42 07/20/2017   Lab Results  Component Value Date   LDLCALC 96 07/20/2017   Lab Results  Component Value Date   TRIG 86 07/20/2017   Lab Results  Component Value Date   CHOLHDL 3.7 07/20/2017   Lab Results  Component Value Date   HGBA1C 5.2 08/20/2020       Assessment & Plan:   Lilliona was seen today for back pain, shoulder pain, neck pain and discuss breast reduction .  Diagnoses and all orders for this visit:  Chronic neck pain -     Ambulatory referral to Plastic Surgery  Chronic pain of both shoulders -     Ambulatory referral to Plastic Surgery  Chronic upper back pain -     Ambulatory referral to Plastic Surgery  Large breasts -     Ambulatory referral to Plastic Surgery   Reports chronic neck, shoulder, and upper back pain due to large breast. Pain is relieved by lifting breast. She desires a breast reduction to relieve her pain. Referral placed today for consult with breast surgeon.   The patient indicates understanding of these issues and agrees with the plan.  Gwenlyn Perking, FNP

## 2021-01-20 ENCOUNTER — Telehealth: Payer: Self-pay | Admitting: Orthopaedic Surgery

## 2021-01-20 NOTE — Telephone Encounter (Signed)
Patient is scheduled for right carpal tunnel release at [facility] on August 11th.  [facility] called and said patient is scheduled for right wrist, but the patient states it is supposed to be the Left.  Dictation indicates right and surgery sheet is indicates right. I called patient about this and she said she wants the left done first since it is her dominant hand.  Please advise if you would like this changed.  Need response ASAP in the event it needs to be corrected.

## 2021-01-20 NOTE — Telephone Encounter (Signed)
Would defer to the patient"s request.Change to left

## 2021-01-29 DIAGNOSIS — Z319 Encounter for procreative management, unspecified: Secondary | ICD-10-CM | POA: Diagnosis not present

## 2021-01-30 ENCOUNTER — Telehealth: Payer: Self-pay

## 2021-01-30 ENCOUNTER — Other Ambulatory Visit: Payer: Self-pay | Admitting: Orthopaedic Surgery

## 2021-01-30 DIAGNOSIS — G5602 Carpal tunnel syndrome, left upper limb: Secondary | ICD-10-CM | POA: Diagnosis not present

## 2021-01-30 HISTORY — PX: OTHER SURGICAL HISTORY: SHX169

## 2021-01-30 LAB — PROGESTERONE: Progesterone: 4.8 ng/mL

## 2021-01-30 MED ORDER — TRAMADOL HCL 50 MG PO TABS: 50.0000 mg | ORAL_TABLET | Freq: Three times a day (TID) | ORAL | 0 refills | Status: DC | PRN

## 2021-01-30 NOTE — Telephone Encounter (Signed)
Keep dressing clean and dry-office next week--can user left hand but needs to stay in splint

## 2021-01-30 NOTE — Telephone Encounter (Signed)
Called and spoke with patient. Relayed information from Dr.Whitfield. I encouraged her to call if she has anymore questions or concerns.

## 2021-01-30 NOTE — Telephone Encounter (Signed)
Patient called wanting to know what she can and can not do.  Patient had right carpal tunnel release today, 01/30/2021.  Cb# [phone].  Please advise.  Thank you.

## 2021-02-05 ENCOUNTER — Ambulatory Visit: Payer: Medicaid Other | Admitting: Family Medicine

## 2021-02-05 ENCOUNTER — Other Ambulatory Visit: Payer: Self-pay

## 2021-02-05 ENCOUNTER — Encounter: Payer: Self-pay | Admitting: Family Medicine

## 2021-02-05 VITALS — BP 112/74 | HR 58 | Temp 98.6°F | Ht 60.0 in | Wt 234.5 lb

## 2021-02-05 DIAGNOSIS — K649 Unspecified hemorrhoids: Secondary | ICD-10-CM

## 2021-02-05 DIAGNOSIS — G5603 Carpal tunnel syndrome, bilateral upper limbs: Secondary | ICD-10-CM | POA: Diagnosis not present

## 2021-02-05 DIAGNOSIS — K219 Gastro-esophageal reflux disease without esophagitis: Secondary | ICD-10-CM | POA: Diagnosis not present

## 2021-02-05 MED ORDER — ESOMEPRAZOLE MAGNESIUM 20 MG PO PACK: 20.0000 mg | PACK | Freq: Every day | ORAL | 12 refills | Status: DC

## 2021-02-05 NOTE — Patient Instructions (Signed)
Food Choices for Gastroesophageal Reflux Disease, Adult °When you have gastroesophageal reflux disease (GERD), the foods you eat and your eating habits are very important. Choosing the right foods can help ease the discomfort of GERD. Consider working with a dietitian to help you make healthy food choices. °What are tips for following this plan? °Reading food labels °Look for foods that are low in saturated fat. Foods that have less than 5% of daily value (DV) of fat and 0 g of trans fats may help with your symptoms. °Cooking °Cook foods using methods other than frying. This may include baking, steaming, grilling, or broiling. These are all methods that do not need a lot of fat for cooking. °To add flavor, try to use herbs that are low in spice and acidity. °Meal planning ° °Choose healthy foods that are low in fat, such as fruits, vegetables, whole grains, low-fat dairy products, lean meats, fish, and poultry. °Eat frequent, small meals instead of three large meals each day. Eat your meals slowly, in a relaxed setting. Avoid bending over or lying down until 2-3 hours after eating. °Limit high-fat foods such as fatty meats or fried foods. °Limit your intake of fatty foods, such as oils, butter, and shortening. °Avoid the following as told by your health care provider: °Foods that cause symptoms. These may be different for different people. Keep a food diary to keep track of foods that cause symptoms. °Alcohol. °Drinking large amounts of liquid with meals. °Eating meals during the 2-3 hours before bed. °Lifestyle °Maintain a healthy weight. Ask your health care provider what weight is healthy for you. If you need to lose weight, work with your health care provider to do so safely. °Exercise for at least 30 minutes on 5 or more days each week, or as told by your health care provider. °Avoid wearing clothes that fit tightly around your waist and chest. °Do not use any products that contain nicotine or tobacco. These  products include cigarettes, chewing tobacco, and vaping devices, such as e-cigarettes. If you need help quitting, ask your health care provider. °Sleep with the head of your bed raised. Use a wedge under the mattress or blocks under the bed frame to raise the head of the bed. °Chew sugar-free gum after mealtimes. °What foods should I eat? °Eat a healthy, well-balanced diet of fruits, vegetables, whole grains, low-fat dairy products, lean meats, fish, and poultry. Each person is different. Foods that may trigger symptoms in one person may not trigger any symptoms in another person. Work with your health care provider to identify foods that are safe for you. °The items listed above may not be a complete list of recommended foods and beverages. Contact a dietitian for more information. °What foods should I avoid? °Limiting some of these foods may help manage the symptoms of GERD. Everyone is different. Consult a dietitian or your health care provider to help you identify the exact foods to avoid, if any. °Fruits °Any fruits prepared with added fat. Any fruits that cause symptoms. For some people this may include citrus fruits, such as oranges, grapefruit, pineapple, and lemons. °Vegetables °Deep-fried vegetables. French fries. Any vegetables prepared with added fat. Any vegetables that cause symptoms. For some people, this may include tomatoes and tomato products, chili peppers, onions and garlic, and horseradish. °Grains °Pastries or quick breads with added fat. °Meats and other proteins °High-fat meats, such as fatty beef or pork, hot dogs, ribs, ham, sausage, salami, and bacon. Fried meat or protein, including fried   fish and fried chicken. Nuts and nut butters, in large amounts. °Dairy °Whole milk and chocolate milk. Sour cream. Cream. Ice cream. Cream cheese. Milkshakes. °Fats and oils °Butter. Margarine. Shortening. Ghee. °Beverages °Coffee and tea, with or without caffeine. Carbonated beverages. Sodas. Energy  drinks. Fruit juice made with acidic fruits, such as orange or grapefruit. Tomato juice. Alcoholic drinks. °Sweets and desserts °Chocolate and cocoa. Donuts. °Seasonings and condiments °Pepper. Peppermint and spearmint. Added salt. Any condiments, herbs, or seasonings that cause symptoms. For some people, this may include curry, hot sauce, or vinegar-based salad dressings. °The items listed above may not be a complete list of foods and beverages to avoid. Contact a dietitian for more information. °Questions to ask your health care provider °Diet and lifestyle changes are usually the first steps that are taken to manage symptoms of GERD. If diet and lifestyle changes do not improve your symptoms, talk with your health care provider about taking medicines. °Where to find more information °International [employer] for Gastrointestinal Disorders: aboutgerd.org °Summary °When you have gastroesophageal reflux disease (GERD), food and lifestyle choices may be very helpful in easing the discomfort of GERD. °Eat frequent, small meals instead of three large meals each day. Eat your meals slowly, in a relaxed setting. Avoid bending over or lying down until 2-3 hours after eating. °Limit high-fat foods such as fatty meats or fried foods. °This information is not intended to replace advice given to you by your health care provider. Make sure you discuss any questions you have with your health care provider. °Document Revised: 12/18/2019 Document Reviewed: 12/18/2019 °Elsevier Patient Education © 2022 [employer] Inc. ° °

## 2021-02-05 NOTE — Progress Notes (Signed)
Established Patient Office Visit  Subjective:  Patient ID: Mckenzie Cooley, female    DOB: [birth year]  Age: 30 y.o. MRN: [id number]  CC:  Chief Complaint  Patient presents with   Hemorrhoids   Gastroesophageal Reflux    HPI Mckenzie Cooley presents for follow up for GERD and hemorrhoids.   She reports that omeprazole upset her upset with cramping. She has taken Nexium in the past with good results. She has been taking zantac and using peppermints. She reports heartburn most days and she continues to have nausea in the evenings. She denies vomiting or weight loss.   She reports that her hemorrhoids have resolved. She stopped drinking some detox drinks she was drinking and this resolved her hemorrhoids.   She had surgery on her left wrist about 1 week ago for carpal tunnel. She reports that this went well and can already tell a difference. She is scheduled for a follow up tomorrow with ortho to have her brace removed. Once this heals in 6 weeks, they plan to complete the same procedure on the right wrist.   Past Medical History:  Diagnosis Date   Acid reflux    Anemia    Bloating    Childhood asthma    History of blood transfusion    PCOS (polycystic ovarian syndrome) 09/27/2014   Preeclampsia    Seasonal allergies     Past Surgical History:  Procedure Laterality Date   CESAREAN SECTION N/A 01/02/2017   Procedure: CESAREAN SECTION;  Surgeon: Chancy Milroy, MD;  Location: [place];  Service: Obstetrics;  Laterality: N/A;   CHOLECYSTECTOMY  2011   Dr.Byerly   ESOPHAGOGASTRODUODENOSCOPY N/A 10/26/2014   Procedure: ESOPHAGOGASTRODUODENOSCOPY (EGD);  Surgeon: Rogene Houston, MD;  [facility];  Service: Endoscopy;  Laterality: N/A;   UPPER GASTROINTESTINAL ENDOSCOPY  12/05/2009   UPPER GASTROINTESTINAL ENDOSCOPY  02/07/2009   WISDOM TOOTH EXTRACTION     WISDOM TOOTH EXTRACTION      Family History  Problem Relation Age of Onset   Hypertension Mother     Heart disease Father    Cancer - Cervical Maternal Aunt    Kidney disease Maternal Grandmother    Renal Disease Maternal Grandmother     Social History   Socioeconomic History   Marital status: Married    Spouse name: Not on file   Number of children: 1   Years of education: Not on file   Highest education level: Not on file  Occupational History   Not on file  Tobacco Use   Smoking status: Former    Packs/day: 0.50    Years: 12.00    Pack years: 6.00    Types: Cigarettes    Quit date: 05/05/2016    Years since quitting: 4.7   Smokeless tobacco: Never  Vaping Use   Vaping Use: Never used  Substance and Sexual Activity   Alcohol use: No    Alcohol/week: 0.0 standard drinks   Drug use: No   Sexual activity: Yes    Birth control/protection: None  Other Topics Concern   Not on file  Social History Narrative   Not on file   Social Determinants of Health   Financial Resource Strain: Not on file  Food Insecurity: Not on file  Transportation Needs: Not on file  Physical Activity: Not on file  Stress: Not on file  Social Connections: Not on file  Intimate Partner Violence: Not on file    Outpatient Medications Prior to Visit  Medication Sig Dispense Refill   acetaminophen (TYLENOL) 500 MG tablet Take 1,000 mg by mouth daily as needed.     letrozole (FEMARA) 2.5 MG tablet Take days 3-7 of cycle 5 tablet 2   ranitidine (ZANTAC) 150 MG capsule Take 150 mg by mouth 2 (two) times daily.     traMADol (ULTRAM) 50 MG tablet Take 1 tablet (50 mg total) by mouth 3 (three) times daily as needed for moderate pain. 30 tablet 0   prenatal vitamin w/FE, FA (PRENATAL 1 + 1) 27-1 MG TABS tablet Take 1 tablet by mouth daily at 12 noon. (Patient not taking: Reported on 02/05/2021) 30 tablet 12   No facility-administered medications prior to visit.    Allergies  Allergen Reactions   Percocet [Oxycodone-Acetaminophen] Hives and Itching   Prilosec [Omeprazole] Other (See Comments)     Stomach cramp   Amoxicillin Rash    Has patient had a PCN reaction causing immediate rash, facial/tongue/throat swelling, SOB or lightheadedness with hypotension: Yes -mild rash Has patient had a PCN reaction causing severe rash involving mucus membranes or skin necrosis: No Has patient had a PCN reaction that required hospitalization: No Has patient had a PCN reaction occurring within the last 10 years: Yes If all of the above answers are "NO", then may proceed with Cephalosporin use. Has tolerated ceftriaxone & cephalexin     ROS Review of Systems As per HPI.    Objective:    Physical Exam Vitals and nursing note reviewed.  Constitutional:      General: She is not in acute distress.    Appearance: She is not ill-appearing, toxic-appearing or diaphoretic.  Cardiovascular:     Rate and Rhythm: Normal rate and regular rhythm.     Heart sounds: Normal heart sounds. No murmur heard. Pulmonary:     Effort: Pulmonary effort is normal. No respiratory distress.     Breath sounds: Normal breath sounds.  Abdominal:     General: Bowel sounds are normal. There is no distension.     Palpations: Abdomen is soft.     Tenderness: There is no abdominal tenderness. There is no guarding or rebound.  Musculoskeletal:     Right lower leg: No edema.     Left lower leg: No edema.     Comments: Left wrist in brace  Skin:    General: Skin is warm and dry.  Neurological:     General: No focal deficit present.     Mental Status: She is alert and oriented to person, place, and time.  Psychiatric:        Mood and Affect: Mood normal.        Behavior: Behavior normal.    BP 112/74   Pulse (!) 58   Temp 98.6 F (37 C) (Temporal)   Ht 5' (1.524 m)   Wt 234 lb 8 oz (106.4 kg)   BMI 45.80 kg/m  Wt Readings from Last 3 Encounters:  02/05/21 234 lb 8 oz (106.4 kg)  01/16/21 236 lb (107 kg)  12/30/20 237 lb (107.5 kg)     Health Maintenance Due  Topic Date Due   Hepatitis C Screening  Never  done   PAP SMEAR-Modifier  05/04/2020    There are no preventive care reminders to display for this patient.  Lab Results  Component Value Date   TSH 1.150 10/30/2020   Lab Results  Component Value Date   WBC 9.9 10/30/2020   HGB 13.0 10/30/2020   HCT 41.6 10/30/2020  MCV 85 10/30/2020   PLT 289 10/30/2020   Lab Results  Component Value Date   NA 141 10/30/2020   K 4.4 10/30/2020   CO2 24 10/30/2020   GLUCOSE 101 (H) 10/30/2020   BUN 15 10/30/2020   CREATININE 0.86 10/30/2020   BILITOT 0.3 10/30/2020   ALKPHOS 73 10/30/2020   AST 20 10/30/2020   ALT 24 10/30/2020   PROT 6.8 10/30/2020   ALBUMIN 4.1 10/30/2020   CALCIUM 9.3 10/30/2020   ANIONGAP 8 10/12/2020   EGFR 93 10/30/2020   Lab Results  Component Value Date   CHOL 155 07/20/2017   Lab Results  Component Value Date   HDL 42 07/20/2017   Lab Results  Component Value Date   LDLCALC 96 07/20/2017   Lab Results  Component Value Date   TRIG 86 07/20/2017   Lab Results  Component Value Date   CHOLHDL 3.7 07/20/2017   Lab Results  Component Value Date   HGBA1C 5.2 08/20/2020      Assessment & Plan:   Keerthana was seen today for hemorrhoids and gastroesophageal reflux.  Diagnoses and all orders for this visit:  Gastroesophageal reflux disease without esophagitis Didn't tolerate prilosec. Has tolerated nexium in the past. Can continue zanctac. Handout given for diet.  -     esomeprazole (NEXIUM) 20 MG packet; Take 20 mg by mouth daily before breakfast.  Hemorrhoids, unspecified hemorrhoid type Resolved.   Bilateral carpal tunnel syndrome Recently had produce on left wrist. Plans to have the same done on the right in 6 weeks. Continue to follow up with ortho.     Follow-up: Return in about 3 months (around 05/08/2021) for GERD.   The patient indicates understanding of these issues and agrees with the plan.  Gwenlyn Perking, FNP

## 2021-02-06 ENCOUNTER — Other Ambulatory Visit (HOSPITAL_COMMUNITY)
Admission: RE | Admit: 2021-02-06 | Discharge: 2021-02-06 | Disposition: A | Discharge status: Home / Self Care | Payer: Medicaid Other | Source: Ambulatory Visit | Attending: Adult Health | Admitting: Adult Health

## 2021-02-06 ENCOUNTER — Ambulatory Visit (INDEPENDENT_AMBULATORY_CARE_PROVIDER_SITE_OTHER): Payer: Medicaid Other | Admitting: Adult Health

## 2021-02-06 ENCOUNTER — Ambulatory Visit (INDEPENDENT_AMBULATORY_CARE_PROVIDER_SITE_OTHER): Payer: Medicaid Other | Admitting: Orthopaedic Surgery

## 2021-02-06 ENCOUNTER — Encounter: Payer: Self-pay | Admitting: Adult Health

## 2021-02-06 VITALS — BP 120/77 | HR 60 | Ht 59.0 in | Wt 235.5 lb

## 2021-02-06 VITALS — Ht 60.0 in | Wt 234.0 lb

## 2021-02-06 DIAGNOSIS — Z124 Encounter for screening for malignant neoplasm of cervix: Secondary | ICD-10-CM | POA: Diagnosis not present

## 2021-02-06 DIAGNOSIS — Z0000 Encounter for general adult medical examination without abnormal findings: Secondary | ICD-10-CM | POA: Insufficient documentation

## 2021-02-06 DIAGNOSIS — G5602 Carpal tunnel syndrome, left upper limb: Secondary | ICD-10-CM

## 2021-02-06 DIAGNOSIS — Z01419 Encounter for gynecological examination (general) (routine) without abnormal findings: Secondary | ICD-10-CM

## 2021-02-06 DIAGNOSIS — Z319 Encounter for procreative management, unspecified: Secondary | ICD-10-CM | POA: Insufficient documentation

## 2021-02-06 NOTE — Progress Notes (Signed)
Patient ID: Mckenzie Cooley, female   DOB: 02/11/1991, 30 y.o.   MRN: [id number] History of Present Illness:  Mckenzie Cooley is a 30 year old white female, married, [id number], in for a well woman gyn exam and pap. She is wanting to get pregnant and is taking Femara. She had CTS 01/20/21 on the left. PCP is Marjorie Smolder NP.   Current Medications, Allergies, Past Medical History, Past Surgical History, Family History and Social History were reviewed in [employer] record.     Review of Systems: Patient denies any headaches, hearing loss, fatigue, blurred vision, shortness of breath, chest pain, abdominal pain, problems with bowel movements, urination, or intercourse. No joint pain or mood swings.     Physical Exam:BP 120/77 (BP Location: Right Arm, Patient Position: Sitting, Cuff Size: Large)   Pulse 60   Ht '4\' 11"'$  (1.499 m)   Wt 235 lb 8 oz (106.8 kg)   LMP 02/04/2021   Breastfeeding No   BMI 47.57 kg/m   General:  Well developed, well nourished, no acute distress Skin:  Warm and dry Neck:  Midline trachea, normal thyroid, good ROM, no lymphadenopathy Lungs; Clear to auscultation bilaterally Breast:  No dominant palpable mass, retraction, or nipple discharge Cardiovascular: Regular rate and rhythm Abdomen:  Soft, non tender, no hepatosplenomegaly Pelvic:  External genitalia is normal in appearance, no lesions.  The vagina is normal in appearance.+blood in vagina. Urethra has no lesions or masses. The cervix is bulbous.  Pap with HR HPV genotyping preformed.Uterus is felt to be normal size, shape, and contour.  No adnexal masses or tenderness noted.Bladder is non tender, no masses felt. Rectal: Deferred Extremities/musculoskeletal:  No swelling or varicosities noted, no clubbing or cyanosis Psych:  No mood changes, alert and cooperative,seems happy AA is 0  Fall risk is low Depression screen [facility] 2/9 02/06/2021 02/05/2021 01/16/2021  Decreased Interest 0 0 0  Down, Depressed,  Hopeless 0 0 0  PHQ - 2 Score 0 0 0  Altered sleeping 0 0 0  Tired, decreased energy 0 0 0  Change in appetite 0 0 0  Feeling bad or failure about yourself  0 0 0  Trouble concentrating 0 0 0  Moving slowly or fidgety/restless 0 0 0  Suicidal thoughts 0 0 0  PHQ-9 Score 0 0 0  Difficult doing work/chores - Not difficult at all Not difficult at all  Some recent data might be hidden    GAD 7 : Generalized Anxiety Score 02/06/2021 02/05/2021 12/25/2020  Nervous, Anxious, on Edge 0 0 0  Control/stop worrying 0 0 0  Worry too much - different things 0 0 3  Trouble relaxing 0 0 0  Restless 0 0 0  Easily annoyed or irritable 0 0 3  Afraid - awful might happen 0 0 0  Total GAD 7 Score 0 0 6  Anxiety Difficulty - Not difficult at all Somewhat difficult      Upstream - 02/06/21 1522       Pregnancy Intention Screening   Does the patient want to become pregnant in the next year? Yes    Does the patient's partner want to become pregnant in the next year? Yes    Would the patient like to discuss contraceptive options today? No      Contraception Wrap Up   Current Method Pregnant/Seeking Pregnancy    End Method Pregnant/Seeking Pregnancy            Examination chaperoned by Levy Pupa LPN  Impression and Plan: 1. Routine general medical examination at a health care facility Pap sent  2. Patient desires pregnancy Start femara today, has refills Check progesterone level 02/25/21, order given   3. Encounter for gynecological examination with Papanicolaou smear of cervix Pap sent Physical in 1 year  Pap in 3 if normal

## 2021-02-06 NOTE — Progress Notes (Signed)
   Post-Op Visit Note   Patient: Mckenzie Cooley           Date of Birth: 1990/10/28           MRN: [id number] Visit Date: 02/06/2021 PCP: Gwenlyn Perking, FNP   Assessment & Plan: Patient turns post carpal tunnel release by Dr. Durward Fortes.  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  Work note supplied.  She will follow-up next week for suture removal.  Chief Complaint:  Chief Complaint  Patient presents with   Left Wrist - Follow-up    01/30/2021 Left CTR   Visit Diagnoses:  1. Carpal tunnel syndrome, left upper limb     Plan:   Follow-Up Instructions: No follow-ups on file.   Orders:  No orders of the defined types were placed in this encounter.  No orders of the defined types were placed in this encounter.   Imaging: No results found.  PMFS History: Patient Active Problem List   Diagnosis Date Noted   Bilateral carpal tunnel syndrome 11/06/2020   Spotting 12/14/2018   Positive urine pregnancy test 12/14/2018   Carpal tunnel syndrome, left upper limb 06/01/2018   Migraine 12/09/2017   Iron deficiency 07/20/2017   Viral upper respiratory tract infection 03/24/2017   Allergic otitis media of both ears 03/24/2017   History of severe pre-eclampsia 02/23/2017   History of anemia 02/23/2017   Previous cesarean section 02/09/2017   H/O severe preeclampsia 02/09/2017   Postoperative anemia due to acute blood loss 01/06/2017   Smoker 09/27/2014   Asthma 07/01/2011   Gastroesophageal reflux disease 07/01/2011   Past Medical History:  Diagnosis Date   Acid reflux    Anemia    Bloating    Childhood asthma    History of blood transfusion    PCOS (polycystic ovarian syndrome) 09/27/2014   Preeclampsia    Seasonal allergies     Family History  Problem Relation Age of Onset   Hypertension Mother    Heart disease Father    Cancer - Cervical Maternal Aunt    Kidney disease Maternal Grandmother    Renal  Disease Maternal Grandmother     Past Surgical History:  Procedure Laterality Date   CESAREAN SECTION N/A 01/02/2017   Procedure: CESAREAN SECTION;  Surgeon: Chancy Milroy, MD;  Location: [facility];  Service: Obstetrics;  Laterality: N/A;   CHOLECYSTECTOMY  2011   Dr.Byerly   ESOPHAGOGASTRODUODENOSCOPY N/A 10/26/2014   Procedure: ESOPHAGOGASTRODUODENOSCOPY (EGD);  Surgeon: Rogene Houston, MD;  [facility];  Service: Endoscopy;  Laterality: N/A;   UPPER GASTROINTESTINAL ENDOSCOPY  12/05/2009   UPPER GASTROINTESTINAL ENDOSCOPY  02/07/2009   WISDOM TOOTH EXTRACTION     WISDOM TOOTH EXTRACTION     Social History   Occupational History   Not on file  Tobacco Use   Smoking status: Former    Packs/day: 0.50    Years: 12.00    Pack years: 6.00    Types: Cigarettes    Quit date: 05/05/2016    Years since quitting: 4.7   Smokeless tobacco: Never  Vaping Use   Vaping Use: Never used  Substance and Sexual Activity   Alcohol use: No    Alcohol/week: 0.0 standard drinks   Drug use: No   Sexual activity: Yes    Birth control/protection: None

## 2021-02-11 LAB — CYTOLOGY - PAP
Comment: NEGATIVE
Diagnosis: NEGATIVE
High risk HPV: NEGATIVE

## 2021-02-13 ENCOUNTER — Encounter: Payer: Self-pay | Admitting: Orthopaedic Surgery

## 2021-02-13 ENCOUNTER — Ambulatory Visit (INDEPENDENT_AMBULATORY_CARE_PROVIDER_SITE_OTHER): Payer: Medicaid Other | Admitting: Orthopaedic Surgery

## 2021-02-13 ENCOUNTER — Other Ambulatory Visit: Payer: Self-pay

## 2021-02-13 VITALS — Ht 59.0 in | Wt 235.0 lb

## 2021-02-13 DIAGNOSIS — G5603 Carpal tunnel syndrome, bilateral upper limbs: Secondary | ICD-10-CM

## 2021-02-13 NOTE — Progress Notes (Signed)
2 weeks post left carpal tunnel release.  Sutures are harvested incision looks good good relief of preop finger numbness.  Mckenzie Cooley has carpal tunnel on the opposite hand Mckenzie Cooley will call back to talk with Dr. Durward Fortes once her operative hand is doing well enough that Mckenzie Cooley feels like Mckenzie Cooley can have the other hand done.

## 2021-02-25 ENCOUNTER — Telehealth: Payer: Self-pay | Admitting: Family Medicine

## 2021-02-25 DIAGNOSIS — Z319 Encounter for procreative management, unspecified: Secondary | ICD-10-CM | POA: Diagnosis not present

## 2021-02-25 DIAGNOSIS — K219 Gastro-esophageal reflux disease without esophagitis: Secondary | ICD-10-CM

## 2021-02-25 NOTE — Telephone Encounter (Signed)
REFERRAL REQUEST Telephone Note  Have you been seen at our office for this problem? yes (Advise that they may need an appointment with their PCP before a referral can be done)  Reason for Referral: GERD also pt feels food gets stuck in throat thinks she might have something stuck Referral discussed with patient: Yes Best contact number of patient for referral team:  [phone]  Has patient been seen by a specialist for this issue before: yes  Patient provider preference for referral:DR. Corbin Ade in [place]  Patient location preference for referral:    Patient notified that referrals can take up to a week or longer to process. If they haven't heard anything within a week they should call back and speak with the referral department.

## 2021-02-26 ENCOUNTER — Ambulatory Visit (HOSPITAL_COMMUNITY)
Admission: EM | Admit: 2021-02-26 | Discharge: 2021-02-26 | Disposition: A | Discharge status: Home / Self Care | Payer: Medicaid Other | Attending: Internal Medicine | Admitting: Internal Medicine

## 2021-02-26 ENCOUNTER — Emergency Department (HOSPITAL_COMMUNITY): Payer: Medicaid Other | Admitting: Anesthesiology

## 2021-02-26 ENCOUNTER — Emergency Department (HOSPITAL_COMMUNITY): Payer: Medicaid Other

## 2021-02-26 ENCOUNTER — Other Ambulatory Visit: Payer: Self-pay

## 2021-02-26 ENCOUNTER — Encounter (HOSPITAL_COMMUNITY): Payer: Self-pay | Admitting: Emergency Medicine

## 2021-02-26 ENCOUNTER — Encounter (HOSPITAL_COMMUNITY)
Admission: EM | Disposition: A | Discharge status: Home / Self Care | Payer: Self-pay | Source: Home / Self Care | Attending: Emergency Medicine

## 2021-02-26 DIAGNOSIS — Z20822 Contact with and (suspected) exposure to covid-19: Secondary | ICD-10-CM | POA: Diagnosis not present

## 2021-02-26 DIAGNOSIS — Z888 Allergy status to other drugs, medicaments and biological substances status: Secondary | ICD-10-CM | POA: Diagnosis not present

## 2021-02-26 DIAGNOSIS — Z8049 Family history of malignant neoplasm of other genital organs: Secondary | ICD-10-CM | POA: Insufficient documentation

## 2021-02-26 DIAGNOSIS — K219 Gastro-esophageal reflux disease without esophagitis: Secondary | ICD-10-CM | POA: Insufficient documentation

## 2021-02-26 DIAGNOSIS — R0989 Other specified symptoms and signs involving the circulatory and respiratory systems: Secondary | ICD-10-CM | POA: Diagnosis not present

## 2021-02-26 DIAGNOSIS — Z6841 Body Mass Index (BMI) 40.0 and over, adult: Secondary | ICD-10-CM | POA: Insufficient documentation

## 2021-02-26 DIAGNOSIS — Z880 Allergy status to penicillin: Secondary | ICD-10-CM | POA: Insufficient documentation

## 2021-02-26 DIAGNOSIS — K317 Polyp of stomach and duodenum: Secondary | ICD-10-CM | POA: Diagnosis not present

## 2021-02-26 DIAGNOSIS — K449 Diaphragmatic hernia without obstruction or gangrene: Secondary | ICD-10-CM | POA: Diagnosis not present

## 2021-02-26 DIAGNOSIS — K2289 Other specified disease of esophagus: Secondary | ICD-10-CM | POA: Insufficient documentation

## 2021-02-26 DIAGNOSIS — Z87891 Personal history of nicotine dependence: Secondary | ICD-10-CM | POA: Insufficient documentation

## 2021-02-26 DIAGNOSIS — Z881 Allergy status to other antibiotic agents status: Secondary | ICD-10-CM | POA: Diagnosis not present

## 2021-02-26 DIAGNOSIS — R1310 Dysphagia, unspecified: Secondary | ICD-10-CM | POA: Diagnosis not present

## 2021-02-26 DIAGNOSIS — Z841 Family history of disorders of kidney and ureter: Secondary | ICD-10-CM | POA: Diagnosis not present

## 2021-02-26 DIAGNOSIS — Z8249 Family history of ischemic heart disease and other diseases of the circulatory system: Secondary | ICD-10-CM | POA: Diagnosis not present

## 2021-02-26 DIAGNOSIS — J45909 Unspecified asthma, uncomplicated: Secondary | ICD-10-CM | POA: Diagnosis not present

## 2021-02-26 DIAGNOSIS — T18108A Unspecified foreign body in esophagus causing other injury, initial encounter: Secondary | ICD-10-CM | POA: Diagnosis not present

## 2021-02-26 DIAGNOSIS — I517 Cardiomegaly: Secondary | ICD-10-CM | POA: Diagnosis not present

## 2021-02-26 HISTORY — PX: BIOPSY: SHX5522

## 2021-02-26 HISTORY — PX: POLYPECTOMY: SHX5525

## 2021-02-26 HISTORY — PX: ESOPHAGOGASTRODUODENOSCOPY (EGD) WITH PROPOFOL: SHX5813

## 2021-02-26 HISTORY — PX: ESOPHAGEAL DILATION: SHX303

## 2021-02-26 LAB — CBC WITH DIFFERENTIAL/PLATELET
Abs Immature Granulocytes: 0.04 10*3/uL (ref 0.00–0.07)
Basophils Absolute: 0.1 10*3/uL (ref 0.0–0.1)
Basophils Relative: 1 %
Eosinophils Absolute: 0.1 10*3/uL (ref 0.0–0.5)
Eosinophils Relative: 1 %
HCT: 44.6 % (ref 36.0–46.0)
Hemoglobin: 14.6 g/dL (ref 12.0–15.0)
Immature Granulocytes: 0 %
Lymphocytes Relative: 26 %
Lymphs Abs: 2.4 10*3/uL (ref 0.7–4.0)
MCH: 27.7 pg (ref 26.0–34.0)
MCHC: 32.7 g/dL (ref 30.0–36.0)
MCV: 84.5 fL (ref 80.0–100.0)
Monocytes Absolute: 0.8 10*3/uL (ref 0.1–1.0)
Monocytes Relative: 9 %
Neutro Abs: 5.8 10*3/uL (ref 1.7–7.7)
Neutrophils Relative %: 63 %
Platelets: 268 10*3/uL (ref 150–400)
RBC: 5.28 MIL/uL — ABNORMAL HIGH (ref 3.87–5.11)
RDW: 12.8 % (ref 11.5–15.5)
WBC: 9.2 10*3/uL (ref 4.0–10.5)
nRBC: 0 % (ref 0.0–0.2)

## 2021-02-26 LAB — COMPREHENSIVE METABOLIC PANEL
ALT: 28 U/L (ref 0–44)
AST: 20 U/L (ref 15–41)
Albumin: 4.1 g/dL (ref 3.5–5.0)
Alkaline Phosphatase: 64 U/L (ref 38–126)
Anion gap: 9 (ref 5–15)
BUN: 9 mg/dL (ref 6–20)
CO2: 27 mmol/L (ref 22–32)
Calcium: 9.2 mg/dL (ref 8.9–10.3)
Chloride: 102 mmol/L (ref 98–111)
Creatinine, Ser: 0.71 mg/dL (ref 0.44–1.00)
GFR, Estimated: >60 mL/min (ref >60)
Glucose, Bld: 104 mg/dL — ABNORMAL HIGH (ref 70–99)
Potassium: 4.4 mmol/L (ref 3.5–5.1)
Sodium: 138 mmol/L (ref 135–145)
Total Bilirubin: 0.8 mg/dL (ref 0.3–1.2)
Total Protein: 7.6 g/dL (ref 6.5–8.1)

## 2021-02-26 LAB — PREGNANCY, URINE: Preg Test, Ur: NEGATIVE

## 2021-02-26 LAB — PROGESTERONE: Progesterone: 0.1 ng/mL

## 2021-02-26 LAB — RESP PANEL BY RT-PCR (FLU A&B, COVID) ARPGX2
Influenza A by PCR: NEGATIVE
Influenza B by PCR: NEGATIVE
SARS Coronavirus 2 by RT PCR: NEGATIVE

## 2021-02-26 SURGERY — ESOPHAGOGASTRODUODENOSCOPY (EGD) WITH PROPOFOL
Anesthesia: General

## 2021-02-26 MED ORDER — LIDOCAINE HCL 1 % IJ SOLN
INTRAMUSCULAR | Status: DC | PRN
  Administered 2021-02-26: 50 mg via INTRADERMAL

## 2021-02-26 MED ORDER — ONDANSETRON HCL 4 MG/2ML IJ SOLN
INTRAMUSCULAR | Status: AC
  Filled 2021-02-26: qty 2

## 2021-02-26 MED ORDER — LACTATED RINGERS IV SOLN: INTRAVENOUS | Status: DC

## 2021-02-26 MED ORDER — ONDANSETRON HCL 4 MG/2ML IJ SOLN
4.0000 mg | Freq: Once | INTRAMUSCULAR | Status: AC
  Administered 2021-02-26: 4 mg via INTRAVENOUS

## 2021-02-26 MED ORDER — PROPOFOL 10 MG/ML IV BOLUS
INTRAVENOUS | Status: DC | PRN
  Administered 2021-02-26: 30 mg via INTRAVENOUS
  Administered 2021-02-26 (×3): 50 mg via INTRAVENOUS
  Administered 2021-02-26: 100 mg via INTRAVENOUS
  Administered 2021-02-26: 50 mg via INTRAVENOUS
  Administered 2021-02-26: 20 mg via INTRAVENOUS
  Administered 2021-02-26: 100 mg via INTRAVENOUS
  Administered 2021-02-26: 50 mg via INTRAVENOUS

## 2021-02-26 MED ORDER — FENTANYL CITRATE (PF) 100 MCG/2ML IJ SOLN
INTRAMUSCULAR | Status: AC
  Filled 2021-02-26: qty 2

## 2021-02-26 MED ORDER — PROPOFOL 10 MG/ML IV BOLUS
INTRAVENOUS | Status: AC
  Filled 2021-02-26: qty 20

## 2021-02-26 MED ORDER — GLUCAGON HCL RDNA (DIAGNOSTIC) 1 MG IJ SOLR
1.0000 mg | Freq: Once | INTRAMUSCULAR | Status: AC
  Administered 2021-02-26: 1 mg via INTRAVENOUS
  Filled 2021-02-26: qty 1

## 2021-02-26 NOTE — Telephone Encounter (Signed)
Referral placed.

## 2021-02-26 NOTE — ED Triage Notes (Signed)
Pt reports feeling like food has been stuck in her throat since Monday. Pt states she is able to drink fluids but not eat. Denis vomiting. Airway patent.

## 2021-02-26 NOTE — Anesthesia Preprocedure Evaluation (Signed)
Anesthesia Evaluation  Patient identified by MRN, date of birth, ID band Patient awake    Reviewed: Allergy & Precautions, H&P , NPO status , Patient's Chart, lab work & pertinent test results, reviewed documented beta blocker date and time   Airway Mallampati: II  TM Distance: >3 FB Neck ROM: full    Dental no notable dental hx.    Pulmonary asthma , former smoker,    Pulmonary exam normal breath sounds clear to auscultation       Cardiovascular Exercise Tolerance: Good hypertension, negative cardio ROS   Rhythm:regular Rate:Normal     Neuro/Psych  Headaches,  Neuromuscular disease negative psych ROS   GI/Hepatic Neg liver ROS, GERD  Medicated,  Endo/Other  Morbid obesity  Renal/GU negative Renal ROS  negative genitourinary   Musculoskeletal   Abdominal   Peds  Hematology  (+) Blood dyscrasia, anemia ,   Anesthesia Other Findings   Reproductive/Obstetrics negative OB ROS                             Anesthesia Physical Anesthesia Plan  ASA: 3 and emergent  Anesthesia Plan: General   Post-op Pain Management:    Induction:   PONV Risk Score and Plan: Propofol infusion  Airway Management Planned:   Additional Equipment:   Intra-op Plan:   Post-operative Plan:   Informed Consent: I have reviewed the patients History and Physical, chart, labs and discussed the procedure including the risks, benefits and alternatives for the proposed anesthesia with the patient or authorized representative who has indicated his/her understanding and acceptance.     Dental Advisory Given  Plan Discussed with: CRNA  Anesthesia Plan Comments:         Anesthesia Quick Evaluation

## 2021-02-26 NOTE — Discharge Instructions (Addendum)
No aspirin or NSAIDs for 24 hours. Resume usual medications as before. Mechanical soft diet for 24 hours and thereafter usual diet. No driving for 24 hours. Physician will call with biopsy results.

## 2021-02-26 NOTE — Anesthesia Postprocedure Evaluation (Signed)
Anesthesia Post Note  Patient: Mckenzie Cooley  Procedure(s) Performed: ESOPHAGOGASTRODUODENOSCOPY (EGD) WITH PROPOFOL ESOPHAGEAL DILATION BIOPSY POLYPECTOMY  Patient location during evaluation: PACU Anesthesia Type: General Level of consciousness: awake and alert Pain management: pain level controlled Vital Signs Assessment: post-procedure vital signs reviewed and stable Respiratory status: spontaneous breathing Cardiovascular status: blood pressure returned to baseline and stable Postop Assessment: no apparent nausea or vomiting Anesthetic complications: no   No notable events documented.   Last Vitals:  Vitals:   02/26/21 1342 02/26/21 1445  BP: 123/69 125/80  Pulse: (!) 55 (!) 57  Resp: 18 20  Temp:  37.1 C  SpO2: 99% 100%    Last Pain:  Vitals:   02/26/21 1445  TempSrc: Oral  PainSc: 2                  Mischa Brittingham

## 2021-02-26 NOTE — Transfer of Care (Signed)
Immediate Anesthesia Transfer of Care Note  Patient: Mckenzie Cooley  Procedure(s) Performed: ESOPHAGOGASTRODUODENOSCOPY (EGD) WITH PROPOFOL ESOPHAGEAL DILATION BIOPSY POLYPECTOMY  Patient Location: Short Stay  Anesthesia Type:General  Level of Consciousness: awake  Airway & Oxygen Therapy: Patient Spontanous Breathing  Post-op Assessment: Report given to RN  Post vital signs: Reviewed  Last Vitals:  Vitals Value Taken Time  BP    Temp    Pulse    Resp    SpO2      Last Pain:  Vitals:   02/26/21 1445  TempSrc: Oral  PainSc: 2          Complications: No notable events documented.

## 2021-02-26 NOTE — Telephone Encounter (Signed)
Left detailed message.   

## 2021-02-26 NOTE — Progress Notes (Signed)
Brief EGD note.  Normal hypopharynx. 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 Esophageal mucosa otherwise normal. GE junction at 34 cm from the incisors 3 cm sliding hiatal hernia. Few gastric polyps and body.  3 were biopsied for histology. No evidence of peptic ulcer disease or pyloric stenosis. Normal bulbar and post bulbar mucosa.  Esophageal dilation performed by passing 54 French Maloney dilator but no mucosal disruption noted. Esophageal biopsy taken for routine histology.

## 2021-02-26 NOTE — Op Note (Addendum)
[facility] Patient Name: Mckenzie Cooley Procedure Date: 02/26/2021 3:04 PM MRN: [id number] Date of Birth: [birth year] Attending MD: Hildred Laser , MD CSN: [id number] Age: 30 Admit Type: Outpatient Procedure:                Upper GI endoscopy Indications:              Foreign body in the esophagus Providers:                Hildred Laser, MD, Rosina Lowenstein, RN, Raphael Gibney,                            Technician Referring MD:             Gwenlyn Perking, FNP Medicines:                Propofol per Anesthesia Complications:            No immediate complications. Estimated Blood Loss:     Estimated blood loss was minimal. Procedure:                Pre-Anesthesia Assessment:                           - Prior to the procedure, a History and Physical                            was performed, and patient medications and                            allergies were reviewed. The patient's tolerance of                            previous anesthesia was also reviewed. The risks                            and benefits of the procedure and the sedation                            options and risks were discussed with the patient.                            All questions were answered, and informed consent                            was obtained. Prior Anticoagulants: The patient has                            taken no previous anticoagulant or antiplatelet                            agents. ASA Grade Assessment: II - A patient with                            mild systemic disease. After reviewing the risks  and benefits, the patient was deemed in                            satisfactory condition to undergo the procedure.                           After obtaining informed consent, the endoscope was                            passed under direct vision. Throughout the                            procedure, the patient's blood pressure, pulse, and                             oxygen saturations were monitored continuously. The                            [id number]-[id number]) scope was introduced through the                            mouth, and advanced to the second part of duodenum.                            The upper GI endoscopy was accomplished without                            difficulty. The patient tolerated the procedure                            well. Scope In: 3:29:54 PM Scope Out: 3:44:30 PM Total Procedure Duration: 0 hours 14 minutes 36 seconds  Findings:      The hypopharynx was normal.      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      The exam of the esophagus was otherwise normal.      The Z-line was regular and was found 34 cm from the incisors.      A 3 cm hiatal hernia was present.      No endoscopic abnormality was evident in the esophagus to explain the       patient's complaint of dysphagia. It was decided, however, to proceed       with dilation of the entire esophagus. The scope was withdrawn. Dilation       was performed with a Maloney dilator with mild resistance at 41 Fr. The       dilation site was examined following endoscope reinsertion and showed no       change and no bleeding, mucosal tear or perforation. Biopsies were taken       esophageal body with a cold forceps for histology. The pathology       specimen was placed into Bottle Number 1.      A few small pedunculated and sessile polyps with no bleeding and no       stigmata of recent bleeding were found in the gastric  body. Biopsies       were taken with a cold forceps for histology. The pathology specimen was       placed into Bottle Number 2.      The exam of the stomach was otherwise normal.      The duodenal bulb and second portion of the duodenum were normal. Impression:               - Normal hypopharynx.                           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                           - Z-line regular, 34 cm from the incisors.                           - 3 cm hiatal hernia.                           - No endoscopic esophageal abnormality to explain                            patient's dysphagia. Esophagus dilated. Dilated.                            Biopsied.                           - A few gastric polyps. Biopsied.                           - Normal duodenal bulb and second portion of the                            duodenum.                           Comment: No foreign body noted in the esophagus or                            stomach. Moderate Sedation:      Per Anesthesia Care Recommendation:           - Patient has a contact number available for                            emergencies. The signs and symptoms of potential                            delayed complications were discussed with the                            patient. Return to normal activities tomorrow.  Written discharge instructions were provided to the                            patient.                           - Mechanical soft diet today.                           - Resume previous diet for 1 day.                           - Continue present medications.                           - No aspirin, ibuprofen, naproxen, or other                            non-steroidal anti-inflammatory drugs for 1 day.                           - Await pathology results. Procedure Code(s):        --- Professional ---                           [id number], Esophagogastroduodenoscopy, flexible,                            transoral; with biopsy, single or multiple                           43450, Dilation of esophagus, by unguided sound or                            bougie, single or multiple passes Diagnosis Code(s):        --- Professional ---                           K22.8, Other specified diseases of esophagus                            K44.9, Diaphragmatic hernia without obstruction or                            gangrene                           R13.10, Dysphagia, unspecified                           K31.7, Polyp of stomach and duodenum                           T18.108A, Unspecified foreign body in esophagus                            causing other injury, initial encounter CPT copyright 2019 American Medical  Association. All rights reserved. The codes documented in this report are preliminary and upon coder review may  be revised to meet current compliance requirements. Hildred Laser, MD Hildred Laser, MD 02/26/2021 4:01:52 PM This report has been signed electronically. Number of Addenda: 0

## 2021-02-26 NOTE — ED Provider Notes (Addendum)
[facility] EMERGENCY DEPARTMENT Provider Note   CSN: [id number] Arrival date & time: 02/26/21  [id number]     History Chief Complaint  Patient presents with   Swallowed Foreign Body    Mckenzie Cooley is a 30 y.o. female.  Patient states that she feels like something is in her throat and it has been there since Monday.  She is able to take liquids then but feels like something is still there.  The history is provided by the patient and medical records. No language interpreter was used.  Swallowed Foreign Body This is a new problem. The current episode started 6 to 12 hours ago. The problem occurs constantly. Pertinent negatives include no chest pain, no abdominal pain and no headaches. Nothing aggravates the symptoms. Nothing relieves the symptoms. She has tried nothing for the symptoms. The treatment provided no relief.      Past Medical History:  Diagnosis Date   Acid reflux    Anemia    Bloating    Childhood asthma    History of blood transfusion    PCOS (polycystic ovarian syndrome) 09/27/2014   Preeclampsia    Seasonal allergies     Patient Active Problem List   Diagnosis Date Noted   Routine general medical examination at a health care facility 02/06/2021   Patient desires pregnancy 02/06/2021   Bilateral carpal tunnel syndrome 11/06/2020   Spotting 12/14/2018   Positive urine pregnancy test 12/14/2018   Carpal tunnel syndrome, left upper limb 06/01/2018   Migraine 12/09/2017   Iron deficiency 07/20/2017   Viral upper respiratory tract infection 03/24/2017   Allergic otitis media of both ears 03/24/2017   History of severe pre-eclampsia 02/23/2017   History of anemia 02/23/2017   Previous cesarean section 02/09/2017   H/O severe preeclampsia 02/09/2017   Postoperative anemia due to acute blood loss 01/06/2017   Smoker 09/27/2014   Asthma 07/01/2011   Gastroesophageal reflux disease 07/01/2011    Past Surgical History:  Procedure Laterality Date   carpal tunnel  Left 01/30/2021   CESAREAN SECTION N/A 01/02/2017   Procedure: CESAREAN SECTION;  Surgeon: Chancy Milroy, MD;  Location: [place];  Service: Obstetrics;  Laterality: N/A;   CHOLECYSTECTOMY  2011   Dr.Byerly   ESOPHAGOGASTRODUODENOSCOPY N/A 10/26/2014   Procedure: ESOPHAGOGASTRODUODENOSCOPY (EGD);  Surgeon: Rogene Houston, MD;  [facility];  Service: Endoscopy;  Laterality: N/A;   UPPER GASTROINTESTINAL ENDOSCOPY  12/05/2009   UPPER GASTROINTESTINAL ENDOSCOPY  02/07/2009   WISDOM TOOTH EXTRACTION     WISDOM TOOTH EXTRACTION       OB History     Gravida  5   Para  1   Term  1   Preterm      AB  4   Living  1      SAB  4   IAB  0   Ectopic  0   Multiple  0   Live Births  1           Family History  Problem Relation Age of Onset   Hypertension Mother    Heart disease Father    Cancer - Cervical Maternal Aunt    Kidney disease Maternal Grandmother    Renal Disease Maternal Grandmother     Social History   Tobacco Use   Smoking status: Former    Packs/day: 0.50    Years: 12.00    Pack years: 6.00    Types: Cigarettes    Quit date: 05/05/2016  Years since quitting: 4.8   Smokeless tobacco: Never  Vaping Use   Vaping Use: Never used  Substance Use Topics   Alcohol use: No    Alcohol/week: 0.0 standard drinks   Drug use: No    Home Medications Prior to Admission medications   Medication Sig Start Date End Date Taking? Authorizing Provider  acetaminophen (TYLENOL) 500 MG tablet Take 1,000 mg by mouth daily as needed.   Yes [provider]  esomeprazole (NEXIUM) 20 MG capsule Take 20 mg by mouth daily as needed (acid reflux). 02/05/21  Yes [provider]  esomeprazole (NEXIUM) 20 MG packet Take 20 mg by mouth daily before breakfast. Patient not taking: No sig reported 02/05/21   Gwenlyn Perking, FNP  letrozole [facility]) 2.5 MG tablet Take days 3-7 of cycle Patient not taking: No sig reported 12/30/20    Estill Dooms, NP  prenatal vitamin w/FE, FA (PRENATAL 1 + 1) 27-1 MG TABS tablet Take 1 tablet by mouth daily at 12 noon. Patient not taking: No sig reported 12/30/20   Estill Dooms, NP  traMADol (ULTRAM) 50 MG tablet Take 1 tablet (50 mg total) by mouth 3 (three) times daily as needed for moderate pain. Patient not taking: No sig reported 01/30/21   Garald Balding, MD    Allergies    Percocet [oxycodone-acetaminophen], Prilosec [omeprazole], and Amoxicillin  Review of Systems   Review of Systems  Constitutional:  Negative for appetite change and fatigue.  HENT:  Negative for congestion, ear discharge and sinus pressure.        Foreign body in throat  Eyes:  Negative for discharge.  Respiratory:  Negative for cough.   Cardiovascular:  Negative for chest pain.  Gastrointestinal:  Negative for abdominal pain and diarrhea.  Genitourinary:  Negative for frequency and hematuria.  Musculoskeletal:  Negative for back pain.  Skin:  Negative for rash.  Neurological:  Negative for seizures and headaches.  Psychiatric/Behavioral:  Negative for hallucinations.    Physical Exam Updated Vital Signs BP 115/72   Pulse (!) 51   Temp 97.9 F (36.6 C) (Oral)   Resp 19   Ht 5' (1.524 m)   Wt 108 kg   LMP 02/04/2021   SpO2 100%   BMI 46.48 kg/m   Physical Exam Vitals reviewed.  Constitutional:      Appearance: She is well-developed.  HENT:     Head: Normocephalic.     Nose: Nose normal.     Mouth/Throat:     Mouth: Mucous membranes are moist.  Eyes:     General: No scleral icterus.    Conjunctiva/sclera: Conjunctivae normal.  Neck:     Thyroid: No thyromegaly.  Cardiovascular:     Rate and Rhythm: Normal rate and regular rhythm.     Heart sounds: No murmur heard.   No friction rub. No gallop.  Pulmonary:     Breath sounds: No stridor. No wheezing or rales.  Chest:     Chest wall: No tenderness.  Abdominal:     General: There is no distension.     Tenderness:  There is no abdominal tenderness. There is no rebound.  Musculoskeletal:        General: Normal range of motion.     Cervical back: Neck supple.  Lymphadenopathy:     Cervical: No cervical adenopathy.  Skin:    Findings: No erythema or rash.  Neurological:     Mental Status: She is alert and oriented to person,  place, and time.     Motor: No abnormal muscle tone.     Coordination: Coordination normal.  Psychiatric:        Behavior: Behavior normal.    ED Results / Procedures / Treatments   Labs (all labs ordered are listed, but only abnormal results are displayed) Labs Reviewed  CBC WITH DIFFERENTIAL/PLATELET - Abnormal; Notable for the following components:      Result Value   RBC 5.28 (*)    All other components within normal limits  COMPREHENSIVE METABOLIC PANEL - Abnormal; Notable for the following components:   Glucose, Bld 104 (*)    All other components within normal limits  RESP PANEL BY RT-PCR (FLU A&B, COVID) [id number]    EKG None  Radiology DG Neck Soft Tissue  Result Date: 02/26/2021 CLINICAL DATA:  30 year old female with globus sensation, feels like food has been stuck in her throat for 2 days. EXAM: NECK SOFT TISSUES - 1+ VIEW COMPARISON:  CTA chest 08/11/2017. FINDINGS: Normal prevertebral, pharyngeal, and laryngeal soft tissue contours. The epiglottis appears normal. Visualized tracheal air column is within normal limits. No radiopaque foreign body identified. No osseous abnormality identified. IMPRESSION: Negative. Electronically Signed   By: Genevie Ann M.D.   On: 02/26/2021 07:23   DG Chest Port 1 View  Result Date: 02/26/2021 CLINICAL DATA:  Feels like food has been stuck in her throat since Monday, able to drink fluids but cannot eat, pain EXAM: PORTABLE CHEST 1 VIEW COMPARISON:  Portable exam 0954 hours compared to 03/15/2013 FINDINGS: Borderline enlargement of cardiac silhouette. Mediastinal contours and pulmonary vascularity normal. Lungs clear. No pulmonary  infiltrate, pleural effusion, or pneumothorax. Osseous structures unremarkable. IMPRESSION: No acute abnormalities. Electronically Signed   By: Lavonia Dana M.D.   On: 02/26/2021 10:40    Procedures Procedures   Medications Ordered in ED Medications  glucagon (human recombinant) (GLUCAGEN) injection 1 mg (1 mg Intravenous Given 02/26/21 0939)  ondansetron (ZOFRAN) injection 4 mg (4 mg Intravenous Given 02/26/21 0950)  ondansetron (ZOFRAN) 4 MG/2ML injection (  Given 02/26/21 0950)    ED Course  I have reviewed the triage vital signs and the nursing notes.  Pertinent labs & imaging results that were available during my care of the patient were reviewed by me and considered in my medical decision making (see chart for details).    MDM Rules/Calculators/A&P                           Patient with foreign body sensation in esophagus but she is able to take liquids.  I spoke with Dr. Laural Golden the gastroenterologist and he stated he will come see the patient and decide about upper endoscopy.      Patient was seen by GI and taken to endoscopy Final Clinical Impression(s) / ED Diagnoses Final diagnoses:  Foreign body in esophagus, initial encounter    Rx / DC Orders ED Discharge Orders     None        Milton Ferguson, MD 02/26/21 1135    Milton Ferguson, MD 02/26/21 1434

## 2021-02-26 NOTE — H&P (Signed)
Mckenzie Cooley is an 30 y.o. female.   Chief Complaint: Patient is here for esophagogastroduodenoscopy with foreign body removal and esophageal dilation. HPI: Patient is 30 year old Caucasian female who has chronic GERD maintained on PPI with experiencing intermittent solid food dysphagia for 2 months.  She presented to emergency room this morning with complaints of food not going down.  She has been tolerating aspiration and clear liquids.  She said symptoms started after she ate cheatos night before last.  She feels somewhat raw and her upper sternal area.  She has not experienced hematemesis melena or abdominal pain.  Her appetite is good and her weight has been stable. She does not smoke cigarettes or drink alcohol.  She does not take any NSAIDs.  Past Medical History:  Diagnosis Date   Acid reflux    Anemia    Bloating    Childhood asthma    History of blood transfusion    PCOS (polycystic ovarian syndrome) 09/27/2014   Preeclampsia    Seasonal allergies     Past Surgical History:  Procedure Laterality Date   carpal tunnel Left 01/30/2021   CESAREAN SECTION N/A 01/02/2017   Procedure: CESAREAN SECTION;  Surgeon: Chancy Milroy, MD;  Location: [place];  Service: Obstetrics;  Laterality: N/A;   CHOLECYSTECTOMY  2011   Dr.Byerly   ESOPHAGOGASTRODUODENOSCOPY N/A 10/26/2014   Procedure: ESOPHAGOGASTRODUODENOSCOPY (EGD);  Surgeon: Rogene Houston, MD;  [facility];  Service: Endoscopy;  Laterality: N/A;   UPPER GASTROINTESTINAL ENDOSCOPY  12/05/2009   UPPER GASTROINTESTINAL ENDOSCOPY  02/07/2009   WISDOM TOOTH EXTRACTION     WISDOM TOOTH EXTRACTION      Family History  Problem Relation Age of Onset   Hypertension Mother    Heart disease Father    Cancer - Cervical Maternal Aunt    Kidney disease Maternal Grandmother    Renal Disease Maternal Grandmother    Social History:  reports that she quit smoking about 4 years ago. Her smoking use included  cigarettes. She has a 6.00 pack-year smoking history. She has never used smokeless tobacco. She reports that she does not drink alcohol and does not use drugs.  Allergies:  Allergies  Allergen Reactions   Percocet [Oxycodone-Acetaminophen] Hives and Itching   Prilosec [Omeprazole] Other (See Comments)    Stomach cramp   Amoxicillin Rash    Has patient had a PCN reaction causing immediate rash, facial/tongue/throat swelling, SOB or lightheadedness with hypotension: Yes -mild rash Has patient had a PCN reaction causing severe rash involving mucus membranes or skin necrosis: No Has patient had a PCN reaction that required hospitalization: No Has patient had a PCN reaction occurring within the last 10 years: Yes If all of the above answers are "NO", then may proceed with Cephalosporin use. Has tolerated ceftriaxone & cephalexin     (Not in a hospital admission)   Results for orders placed or performed during the hospital encounter of 02/26/21 (from the past 48 hour(s))  CBC with Differential/Platelet     Status: Abnormal   Collection Time: 02/26/21  8:41 AM  Result Value Ref Range   WBC 9.2 4.0 - 10.5 K/uL   RBC 5.28 (H) 3.87 - 5.11 MIL/uL   Hemoglobin 14.6 12.0 - 15.0 g/dL   HCT 44.6 36.0 - 46.0 %   MCV 84.5 80.0 - 100.0 fL   MCH 27.7 26.0 - 34.0 pg   MCHC 32.7 30.0 - 36.0 g/dL   RDW 12.8 11.5 - 15.5 %  Platelets 268 150 - 400 K/uL   nRBC 0.0 0.0 - 0.2 %   Neutrophils Relative % 63 %   Neutro Abs 5.8 1.7 - 7.7 K/uL   Lymphocytes Relative 26 %   Lymphs Abs 2.4 0.7 - 4.0 K/uL   Monocytes Relative 9 %   Monocytes Absolute 0.8 0.1 - 1.0 K/uL   Eosinophils Relative 1 %   Eosinophils Absolute 0.1 0.0 - 0.5 K/uL   Basophils Relative 1 %   Basophils Absolute 0.1 0.0 - 0.1 K/uL   Immature Granulocytes 0 %   Abs Immature Granulocytes 0.04 0.00 - 0.07 K/uL    Comment: Performed at [facility], [address], [place], [place] [address]  Comprehensive metabolic panel     Status:  Abnormal   Collection Time: 02/26/21  8:41 AM  Result Value Ref Range   Sodium 138 135 - 145 mmol/L   Potassium 4.4 3.5 - 5.1 mmol/L   Chloride 102 98 - 111 mmol/L   CO2 27 22 - 32 mmol/L   Glucose, Bld 104 (H) 70 - 99 mg/dL    Comment: Glucose reference range applies only to samples taken after fasting for at least 8 hours.   BUN 9 6 - 20 mg/dL   Creatinine, Ser 0.71 0.44 - 1.00 mg/dL   Calcium 9.2 8.9 - 10.3 mg/dL   Total Protein 7.6 6.5 - 8.1 g/dL   Albumin 4.1 3.5 - 5.0 g/dL   AST 20 15 - 41 U/L   ALT 28 0 - 44 U/L   Alkaline Phosphatase 64 38 - 126 U/L   Total Bilirubin 0.8 0.3 - 1.2 mg/dL   GFR, Estimated >60 >60 mL/min    Comment: (NOTE) Calculated using the CKD-EPI Creatinine Equation (2021)    Anion gap 9 5 - 15    Comment: Performed at [facility], [address], [place], [place] [address]  Resp Panel by RT-PCR (Flu A&B, Covid) Nasopharyngeal Swab     Status: None   Collection Time: 02/26/21 10:41 AM   Specimen: Nasopharyngeal Swab; Nasopharyngeal(NP) swabs in vial transport medium  Result Value Ref Range   SARS Coronavirus 2 by RT PCR NEGATIVE NEGATIVE    Comment: (NOTE) SARS-CoV-2 target nucleic acids are NOT DETECTED.  The SARS-CoV-2 RNA is generally detectable in upper respiratory specimens during the acute phase of infection. The lowest concentration of SARS-CoV-2 viral copies this assay can detect is 138 copies/mL. A negative result does not preclude SARS-Cov-2 infection and should not be used as the sole basis for treatment or other patient management decisions. A negative result may occur with  improper specimen collection/handling, submission of specimen other than nasopharyngeal swab, presence of viral mutation(s) within the areas targeted by this assay, and inadequate number of viral copies(<138 copies/mL). A negative result must be combined with clinical observations, patient history, and epidemiological information. The expected result is  Negative.  Fact Sheet for Patients:  EntrepreneurPulse.com.au  Fact Sheet for Healthcare Providers:  IncredibleEmployment.be  This test is no t yet approved or cleared by the [place] FDA and  has been authorized for detection and/or diagnosis of SARS-CoV-2 by FDA under an Emergency Use Authorization (EUA). This EUA will remain  in effect (meaning this test can be used) for the duration of the COVID-19 declaration under Section 564(b)(1) of the Act, 21 U.S.C.section 360bbb-3(b)(1), unless the authorization is terminated  or revoked sooner.       Influenza A by PCR NEGATIVE NEGATIVE   Influenza B by  PCR NEGATIVE NEGATIVE    Comment: (NOTE) The Xpert Xpress SARS-CoV-2/FLU/RSV plus assay is intended as an aid in the diagnosis of influenza from Nasopharyngeal swab specimens and should not be used as a sole basis for treatment. Nasal washings and aspirates are unacceptable for Xpert Xpress SARS-CoV-2/FLU/RSV testing.  Fact Sheet for Patients: EntrepreneurPulse.com.au  Fact Sheet for Healthcare Providers: IncredibleEmployment.be  This test is not yet approved or cleared by the [place] FDA and has been authorized for detection and/or diagnosis of SARS-CoV-2 by FDA under an Emergency Use Authorization (EUA). This EUA will remain in effect (meaning this test can be used) for the duration of the COVID-19 declaration under Section 564(b)(1) of the Act, 21 U.S.C. section 360bbb-3(b)(1), unless the authorization is terminated or revoked.  Performed at [facility], [address] [place], [place] [address]   Pregnancy, urine     Status: None   Collection Time: 02/26/21  2:21 PM  Result Value Ref Range   Preg Test, Ur NEGATIVE NEGATIVE    Comment:        THE SENSITIVITY OF THIS METHODOLOGY IS >20 mIU/mL. Performed at [facility], [address], [place], [place] [address]    DG Neck Soft  Tissue  Result Date: 02/26/2021 CLINICAL DATA:  30 year old female with globus sensation, feels like food has been stuck in her throat for 2 days. EXAM: NECK SOFT TISSUES - 1+ VIEW COMPARISON:  CTA chest 08/11/2017. FINDINGS: Normal prevertebral, pharyngeal, and laryngeal soft tissue contours. The epiglottis appears normal. Visualized tracheal air column is within normal limits. No radiopaque foreign body identified. No osseous abnormality identified. IMPRESSION: Negative. Electronically Signed   By: Genevie Ann M.D.   On: 02/26/2021 07:23   DG Chest Port 1 View  Result Date: 02/26/2021 CLINICAL DATA:  Feels like food has been stuck in her throat since Monday, able to drink fluids but cannot eat, pain EXAM: PORTABLE CHEST 1 VIEW COMPARISON:  Portable exam 0954 hours compared to 03/15/2013 FINDINGS: Borderline enlargement of cardiac silhouette. Mediastinal contours and pulmonary vascularity normal. Lungs clear. No pulmonary infiltrate, pleural effusion, or pneumothorax. Osseous structures unremarkable. IMPRESSION: No acute abnormalities. Electronically Signed   By: Lavonia Dana M.D.   On: 02/26/2021 10:40    Review of Systems  Blood pressure 125/80, pulse (!) 57, temperature 98.7 F (37.1 C), temperature source Oral, resp. rate 20, height 5' (1.524 m), weight 108 kg, last menstrual period 02/04/2021, SpO2 100 %. Physical Exam HENT:     Mouth/Throat:     Mouth: Mucous membranes are moist.     Pharynx: Oropharynx is clear.  Eyes:     General: No scleral icterus.    Conjunctiva/sclera: Conjunctivae normal.  Cardiovascular:     Rate and Rhythm: Normal rate and regular rhythm.     Heart sounds: Normal heart sounds. No murmur heard. Pulmonary:     Effort: Pulmonary effort is normal.     Breath sounds: Normal breath sounds.  Abdominal:     Comments: Abdomen is obese but soft and nontender with organomegaly or masses.  Musculoskeletal:        General: No swelling.     Cervical back: Neck supple.   Lymphadenopathy:     Cervical: No cervical adenopathy.  Skin:    General: Skin is warm and dry.  Neurological:     Mental Status: She is alert.     Assessment/Plan  Foreign body esophagus. Esophagogastroduodenoscopy with foreign body removal and esophageal dilation.  Hildred Laser, MD 02/26/2021, 3:20  PM

## 2021-02-27 ENCOUNTER — Encounter (INDEPENDENT_AMBULATORY_CARE_PROVIDER_SITE_OTHER): Payer: Self-pay | Admitting: *Deleted

## 2021-02-27 ENCOUNTER — Telehealth: Payer: Self-pay

## 2021-02-27 NOTE — Telephone Encounter (Signed)
Transition Care Management Unsuccessful Follow-up Telephone Call  Date of discharge and from where:  02/26/2021-[place]  Attempts:  1st Attempt  Reason for unsuccessful TCM follow-up call:  Left voice message

## 2021-02-28 LAB — SURGICAL PATHOLOGY

## 2021-02-28 NOTE — Telephone Encounter (Signed)
Transition Care Management Follow-up Telephone Call Date of discharge and from where: 02/26/2021-[place] How have you been since you were released from the hospital? Patient stated she is doing ok.  Any questions or concerns? No  Items Reviewed: Did the pt receive and understand the discharge instructions provided? Yes  Medications obtained and verified?  No medications sent at discharge Other? No  Any new allergies since your discharge? No  Dietary orders reviewed? N/A Do you have support at home? Yes   Home Care and Equipment/Supplies: Were home health services ordered? not applicable If so, what is the name of the agency? N/A  Has the agency set up a time to come to the patient's home? not applicable Were any new equipment or medical supplies ordered?  No What is the name of the medical supply agency? N/A Were you able to get the supplies/equipment? not applicable Do you have any questions related to the use of the equipment or supplies? No  Functional Questionnaire: (I = Independent and D = Dependent) ADLs: I  Bathing/Dressing- I  Meal Prep- I  Eating- I  Maintaining continence- I  Transferring/Ambulation- I  Managing Meds- I  Follow up appointments reviewed:  PCP Hospital f/u appt confirmed? No   Specialist Hospital f/u appt confirmed? No   Are transportation arrangements needed? No  If their condition worsens, is the pt aware to call PCP or go to the Emergency Dept.? Yes Was the patient provided with contact information for the PCP's office or ED? Yes Was to pt encouraged to call back with questions or concerns? Yes

## 2021-03-03 ENCOUNTER — Other Ambulatory Visit (INDEPENDENT_AMBULATORY_CARE_PROVIDER_SITE_OTHER): Payer: Self-pay

## 2021-03-03 DIAGNOSIS — R1319 Other dysphagia: Secondary | ICD-10-CM

## 2021-03-05 ENCOUNTER — Encounter (HOSPITAL_COMMUNITY): Payer: Self-pay | Admitting: Internal Medicine

## 2021-03-06 ENCOUNTER — Ambulatory Visit (HOSPITAL_COMMUNITY)
Admission: RE | Admit: 2021-03-06 | Discharge: 2021-03-06 | Disposition: A | Discharge status: Home / Self Care | Payer: Medicaid Other | Source: Ambulatory Visit | Attending: Internal Medicine | Admitting: Internal Medicine

## 2021-03-06 ENCOUNTER — Other Ambulatory Visit: Payer: Self-pay

## 2021-03-06 DIAGNOSIS — R1319 Other dysphagia: Secondary | ICD-10-CM | POA: Diagnosis not present

## 2021-03-06 DIAGNOSIS — K224 Dyskinesia of esophagus: Secondary | ICD-10-CM | POA: Diagnosis not present

## 2021-03-11 ENCOUNTER — Encounter (INDEPENDENT_AMBULATORY_CARE_PROVIDER_SITE_OTHER): Payer: Self-pay

## 2021-03-12 ENCOUNTER — Other Ambulatory Visit: Payer: Self-pay

## 2021-03-12 ENCOUNTER — Encounter: Payer: Self-pay | Admitting: Orthopaedic Surgery

## 2021-03-12 ENCOUNTER — Ambulatory Visit (INDEPENDENT_AMBULATORY_CARE_PROVIDER_SITE_OTHER): Payer: Medicaid Other | Admitting: Orthopaedic Surgery

## 2021-03-12 DIAGNOSIS — G5602 Carpal tunnel syndrome, left upper limb: Secondary | ICD-10-CM

## 2021-03-12 DIAGNOSIS — G5603 Carpal tunnel syndrome, bilateral upper limbs: Secondary | ICD-10-CM

## 2021-03-12 NOTE — Telephone Encounter (Signed)
Talked with referral coordinator and referral was sent 03/10/21 and states [facility] will [facility] records and call her. I explained this to pt. She wanted to let dr Laural Golden know that 2 nights ago she was up half the night choking and she is sleeping at an angle.

## 2021-03-12 NOTE — Progress Notes (Signed)
Office Visit Note   Patient: Mckenzie Cooley           Date of Birth: 03-03-1991           MRN: [id number] Visit Date: 03/12/2021              Requested by: Gwenlyn Perking, [place],  [place] [id number] PCP: Gwenlyn Perking, FNP   Assessment & Plan: Visit Diagnoses:  1. Bilateral carpal tunnel syndrome   2. Carpal tunnel syndrome, left upper limb     Plan: 5-1/2-week status post left carpal tunnel release and doing quite well.  No longer has the pain or numbness or tingling into the hand.  Scar is still little bit tender but will apply vitamin E or aloe vera.  Wants to consider release of her right carpal tunnel at some point in the future and will call  Follow-Up Instructions: Return if symptoms worsen or fail to improve.   Orders:  No orders of the defined types were placed in this encounter.  No orders of the defined types were placed in this encounter.     Procedures: No procedures performed   Clinical Data: No additional findings.   Subjective: Chief Complaint  Patient presents with   Left Wrist - Follow-up    Left carpal tunnel release 01/30/2021  Patient presents today for follow up on her left wrist. She had carpal tunnel release on 01/30/2021. She is now 6 weeks out from surgery and doing well.   HPI  Review of Systems   Objective: Vital Signs: LMP 03/06/2021 Comment: Urine pregnancy test negative, see Results Review  Physical Exam  Ortho Exam left hand carpal tunnel incision is healing without problem.  There is just a little bit of induration and firmness.  Good opposition of thumb to little finger.  Full range of motion of her digits.  Normal sensation  Specialty Comments:  No specialty comments available.  Imaging: No results found.   PMFS History: Patient Active Problem List   Diagnosis Date Noted   Routine general medical examination at a health care facility 02/06/2021   Patient desires pregnancy 02/06/2021   Bilateral  carpal tunnel syndrome 11/06/2020   Spotting 12/14/2018   Positive urine pregnancy test 12/14/2018   Carpal tunnel syndrome, left upper limb 06/01/2018   Migraine 12/09/2017   Iron deficiency 07/20/2017   Viral upper respiratory tract infection 03/24/2017   Allergic otitis media of both ears 03/24/2017   History of severe pre-eclampsia 02/23/2017   History of anemia 02/23/2017   Previous cesarean section 02/09/2017   H/O severe preeclampsia 02/09/2017   Postoperative anemia due to acute blood loss 01/06/2017   Smoker 09/27/2014   Asthma 07/01/2011   Gastroesophageal reflux disease 07/01/2011   Past Medical History:  Diagnosis Date   Acid reflux    Anemia    Bloating    Childhood asthma    History of blood transfusion    PCOS (polycystic ovarian syndrome) 09/27/2014   Preeclampsia    Seasonal allergies     Family History  Problem Relation Age of Onset   Hypertension Mother    Heart disease Father    Cancer - Cervical Maternal Aunt    Kidney disease Maternal Grandmother    Renal Disease Maternal Grandmother     Past Surgical History:  Procedure Laterality Date   BIOPSY  02/26/2021   Procedure: BIOPSY;  Surgeon: Rogene Houston, MD;  [facility];  Service:  Endoscopy;;  Esophageal biopsy   carpal tunnel Left 01/30/2021   CESAREAN SECTION N/A 01/02/2017   Procedure: CESAREAN SECTION;  Surgeon: Chancy Milroy, MD;  Location: [place];  Service: Obstetrics;  Laterality: N/A;   CHOLECYSTECTOMY  2011   Dr.Byerly   ESOPHAGEAL DILATION N/A 02/26/2021   Procedure: ESOPHAGEAL DILATION;  Surgeon: Rogene Houston, MD;  [facility];  Service: Endoscopy;  Laterality: N/A;   ESOPHAGOGASTRODUODENOSCOPY N/A 10/26/2014   Procedure: ESOPHAGOGASTRODUODENOSCOPY (EGD);  Surgeon: Rogene Houston, MD;  [facility];  Service: Endoscopy;  Laterality: N/A;   ESOPHAGOGASTRODUODENOSCOPY (EGD) WITH PROPOFOL N/A 02/26/2021   Procedure:  ESOPHAGOGASTRODUODENOSCOPY (EGD) WITH PROPOFOL;  Surgeon: Rogene Houston, MD;  [facility];  Service: Endoscopy;  Laterality: N/A;   POLYPECTOMY  02/26/2021   Procedure: POLYPECTOMY;  Surgeon: Rogene Houston, MD;  [facility];  Service: Endoscopy;;  Gastric Body Polyps x 3    UPPER GASTROINTESTINAL ENDOSCOPY  12/05/2009   UPPER GASTROINTESTINAL ENDOSCOPY  02/07/2009   WISDOM TOOTH EXTRACTION     WISDOM TOOTH EXTRACTION     Social History   Occupational History   Not on file  Tobacco Use   Smoking status: Former    Packs/day: 0.50    Years: 12.00    Pack years: 6.00    Types: Cigarettes    Quit date: 05/05/2016    Years since quitting: 4.8   Smokeless tobacco: Never  Vaping Use   Vaping Use: Never used  Substance and Sexual Activity   Alcohol use: No    Alcohol/week: 0.0 standard drinks   Drug use: No   Sexual activity: Yes    Birth control/protection: None

## 2021-03-13 ENCOUNTER — Other Ambulatory Visit (INDEPENDENT_AMBULATORY_CARE_PROVIDER_SITE_OTHER): Payer: Self-pay | Admitting: *Deleted

## 2021-03-13 MED ORDER — NITROGLYCERIN 0.4 MG SL SUBL: SUBLINGUAL_TABLET | SUBLINGUAL | 0 refills | Status: DC

## 2021-03-13 NOTE — Telephone Encounter (Signed)
Per dr Laural Golden - nitroglycerin 0.4mg . take one while sitting 5 mins before each meal 3 times a day #100. 0 refills. Stop med is having headache or lightheadness. Call next week with an update. Pt called and notified and she verbalized understanding.

## 2021-03-18 NOTE — Telephone Encounter (Signed)
Pt called and not comfortable taking nitroglycerin. States her grandfather took it and he passed out and they had to call ems one time and she is worried about taking this med. Would like to know if there is another med she can take to help with muscle spasms. She states she is having to hold a massager on her neck and chest to get food to go down. When lying on her left side or standing she feels a lump on left side where rib cage is. She has a burning sensation she rubbing that area. Has not heard back from [facility] about appt. ( Referral was sent ). She is eating soft foods.

## 2021-03-19 ENCOUNTER — Other Ambulatory Visit (INDEPENDENT_AMBULATORY_CARE_PROVIDER_SITE_OTHER): Payer: Self-pay | Admitting: Internal Medicine

## 2021-03-19 ENCOUNTER — Other Ambulatory Visit (INDEPENDENT_AMBULATORY_CARE_PROVIDER_SITE_OTHER): Payer: Self-pay | Admitting: *Deleted

## 2021-03-19 MED ORDER — DILTIAZEM HCL 30 MG PO TABS: ORAL_TABLET | ORAL | 0 refills | Status: DC

## 2021-03-19 NOTE — Telephone Encounter (Signed)
Hi Anique,  Per Dr. Laural Golden - cancel nitro and may take cardizem 30 mg, 30 mins before each meal to relax esophagus, may give a 2 week supply if pt is interested in taking med. Ok to use massager but not too hard so that pt does not choke, feels like lump on left side is not related.   If  you want to try cardizem let me know which pharmacy to send med to.

## 2021-03-25 ENCOUNTER — Telehealth (INDEPENDENT_AMBULATORY_CARE_PROVIDER_SITE_OTHER): Payer: Self-pay | Admitting: *Deleted

## 2021-03-25 NOTE — Telephone Encounter (Signed)
Referraplced for Dr Silverio Decamp, they will contact patient to schedule, please referral as URGENT

## 2021-03-25 NOTE — Telephone Encounter (Signed)
Referral placed for for Dr Silverio Decamp, they will contact patient with apt, placed referral as urgent

## 2021-03-25 NOTE — Telephone Encounter (Signed)
Discussed with dr Laural Golden and he would like to refer pt to [facility] or [place] Dr. Silverio Decamp.

## 2021-03-25 NOTE — Telephone Encounter (Signed)
Discussed with dr Laural Golden and he would like to refer pt to [facility] or [place] Dr. Silverio Decamp.

## 2021-03-26 ENCOUNTER — Ambulatory Visit (INDEPENDENT_AMBULATORY_CARE_PROVIDER_SITE_OTHER): Payer: Medicaid Other | Admitting: Family Medicine

## 2021-03-26 DIAGNOSIS — B3731 Acute candidiasis of vulva and vagina: Secondary | ICD-10-CM

## 2021-03-26 MED ORDER — FLUCONAZOLE 150 MG PO TABS: 150.0000 mg | ORAL_TABLET | Freq: Once | ORAL | 0 refills | Status: AC

## 2021-03-26 NOTE — Progress Notes (Signed)
Telephone visit  Subjective: CC: yeast vaginitis PCP: Mckenzie Perking, FNP [id number] Mckenzie Cooley is a 29 y.o. female calls for telephone consult today. Patient provides verbal consent for consult held via phone.  Due to COVID-19 pandemic this visit was conducted virtually. This visit type was conducted due to national recommendations for restrictions regarding the COVID-19 Pandemic (e.g. social distancing, sheltering in place) in an effort to limit this patient's exposure and mitigate transmission in our community. All issues noted in this document were discussed and addressed.  A physical exam was not performed with this format.   Location of patient: home Location of provider: [facility] Others present for call: none  1. Yeast vaginitis She reports that for almost 5 days, she has been taking AZO yeast plus.  She reports vaginal discharge and perfuse itching.  She reports erythema and now external pain with urination.  She just got off her menses  ROS: Per HPI  Allergies  Allergen Reactions   Percocet [Oxycodone-Acetaminophen] Hives and Itching   Prilosec [Omeprazole] Other (See Comments)    Stomach cramp   Amoxicillin Rash    Has patient had a PCN reaction causing immediate rash, facial/tongue/throat swelling, SOB or lightheadedness with hypotension: Yes -mild rash Has patient had a PCN reaction causing severe rash involving mucus membranes or skin necrosis: No Has patient had a PCN reaction that required hospitalization: No Has patient had a PCN reaction occurring within the last 10 years: Yes If all of the above answers are "NO", then may proceed with Cephalosporin use. Has tolerated ceftriaxone & cephalexin    Past Medical History:  Diagnosis Date   Acid reflux    Anemia    Bloating    Childhood asthma    History of blood transfusion    PCOS (polycystic ovarian syndrome) 09/27/2014   Preeclampsia    Seasonal allergies     Current Outpatient Medications:    diltiazem (CARDIZEM)  30 MG tablet, Take one tablet 30 minutes before each meal., Disp: 42 tablet, Rfl: 0   esomeprazole (NEXIUM) 20 MG capsule, Take 20 mg by mouth daily as needed (acid reflux)., Disp: , Rfl:    esomeprazole (NEXIUM) 20 MG packet, Take 20 mg by mouth daily before breakfast., Disp: 30 each, Rfl: 12   letrozole (FEMARA) 2.5 MG tablet, Take days 3-7 of cycle, Disp: 5 tablet, Rfl: 2   prenatal vitamin w/FE, FA (PRENATAL 1 + 1) 27-1 MG TABS tablet, Take 1 tablet by mouth daily at 12 noon., Disp: 30 tablet, Rfl: 12   traMADol (ULTRAM) 50 MG tablet, Take 1 tablet (50 mg total) by mouth 3 (three) times daily as needed for moderate pain., Disp: 30 tablet, Rfl: 0  Assessment/ Plan: 30 y.o. female   Yeast vaginitis - Plan: fluconazole (DIFLUCAN) 150 MG tablet  Clinically seems very consistent with yeast vaginitis.  Fluconazole sent.  She will take 1 tablet today and then repeat in 3 days since it sounds to be very severe and refractory to OTC management.  We discussed that if symptoms do not resolve or if they worsen I would like her to be seen in office for pelvic exam.  She voiced understanding of follow-up.  Start time: 5:25pm End time: 5:30pm  Total time spent on patient care (including telephone call/ virtual visit): 5 minutes  [name], [place] [phone]

## 2021-03-27 DIAGNOSIS — G5601 Carpal tunnel syndrome, right upper limb: Secondary | ICD-10-CM | POA: Diagnosis not present

## 2021-03-27 HISTORY — PX: CARPAL TUNNEL RELEASE: SHX101

## 2021-03-31 ENCOUNTER — Telehealth: Payer: Self-pay | Admitting: Gastroenterology

## 2021-03-31 ENCOUNTER — Other Ambulatory Visit: Payer: Self-pay

## 2021-03-31 DIAGNOSIS — R1319 Other dysphagia: Secondary | ICD-10-CM

## 2021-03-31 NOTE — Telephone Encounter (Signed)
Patient calling back stating she called and confirmed that she does an esophageal manometry scheduled for 05/02/21 with Dr. Laural Golden.

## 2021-03-31 NOTE — Telephone Encounter (Signed)
Hi Dr. Silverio Decamp,   We received an Urgent referral from Dr. Hildred Laser in [place] for patient to be evaluated by you for Esophageal dysmotility. Records are available through Epic for review.   Please advise on scheudling. Thank you

## 2021-03-31 NOTE — Telephone Encounter (Signed)
Reviewed barium esophagogram, no significant abnormality other than mild esophageal dysmotility. Please schedule direct esophageal manometry at [facility] dno unit next available appointment.  Unfortunately due to staffing issue there is significant delay in getting the procedures scheduled.

## 2021-03-31 NOTE — Telephone Encounter (Signed)
Called patient back to confirm, and Yes she does have an esophageal manometry scheduled at [facility] on 05/02/21

## 2021-03-31 NOTE — Telephone Encounter (Signed)
Patient has been scheduled for an esophageal manometry at 07/02/2021 at 12:30 pm. Pt is to arrive at [facility] at 12 pm.  CASE ID: [id number]  Spoke with patient in regards to results and recommendations. Pt states that she thinks she is already scheduled for a manometry on 11/11 for Dr. Laural Golden. Advised that I can't see the appt in epic, advised patient to call over and clarify what the appt is for and call us back with an update. Pt wanted to know if she would be seeing Dr. Silverio Decamp or Dr. Laural Golden. Advised that once we have the information about the appt we will let Dr. Silverio Decamp know and she will further advise at that time. Pt verbalized understanding.

## 2021-04-01 ENCOUNTER — Ambulatory Visit: Payer: Medicaid Other | Admitting: Nurse Practitioner

## 2021-04-01 ENCOUNTER — Encounter: Payer: Self-pay | Admitting: Nurse Practitioner

## 2021-04-01 ENCOUNTER — Other Ambulatory Visit: Payer: Self-pay

## 2021-04-01 ENCOUNTER — Other Ambulatory Visit (HOSPITAL_COMMUNITY)
Admission: RE | Admit: 2021-04-01 | Discharge: 2021-04-01 | Disposition: A | Discharge status: Home / Self Care | Payer: Medicaid Other | Source: Ambulatory Visit | Attending: Nurse Practitioner | Admitting: Nurse Practitioner

## 2021-04-01 VITALS — BP 146/81 | HR 80 | Temp 97.7°F | Ht 60.0 in | Wt 231.0 lb

## 2021-04-01 DIAGNOSIS — N898 Other specified noninflammatory disorders of vagina: Secondary | ICD-10-CM | POA: Insufficient documentation

## 2021-04-01 DIAGNOSIS — R8290 Unspecified abnormal findings in urine: Secondary | ICD-10-CM

## 2021-04-01 LAB — URINALYSIS, ROUTINE W REFLEX MICROSCOPIC
Bilirubin, UA: NEGATIVE
Glucose, UA: NEGATIVE
Ketones, UA: NEGATIVE
Leukocytes,UA: NEGATIVE
Nitrite, UA: NEGATIVE
Protein,UA: NEGATIVE
RBC, UA: NEGATIVE
Specific Gravity, UA: >1.03 — ABNORMAL HIGH (ref 1.005–1.030)
Urobilinogen, Ur: 0.2 mg/dL (ref 0.2–1.0)
pH, UA: 5.5 (ref 5.0–7.5)

## 2021-04-01 LAB — WET PREP FOR TRICH, YEAST, CLUE
Clue Cell Exam: NEGATIVE
Trichomonas Exam: NEGATIVE

## 2021-04-01 NOTE — Telephone Encounter (Signed)
Spoke with patient, she has been informed to keep manometry as scheduled for 05/02/21 and Dr. Laural Golden will follow up with her from here. She is aware that we have cancelled 06/2021 manometry appt. Pt verbalized understanding and had no concerns at the end of the call.

## 2021-04-01 NOTE — Telephone Encounter (Signed)
Dr. Silverio Decamp, please see note below. Please advise, thanks.

## 2021-04-01 NOTE — Telephone Encounter (Signed)
Lm on vm for patient to return call.  Manometry for 07/02/20 at [facility] has been cancelled.

## 2021-04-01 NOTE — Telephone Encounter (Signed)
Ok, thanks.

## 2021-04-01 NOTE — Progress Notes (Signed)
Acute Office Visit  Subjective:    Patient ID: Mckenzie Cooley, female    DOB: 01/05/1991, 30 y.o.   MRN: [id number]  Chief Complaint  Patient presents with   Vaginal Discharge    Vaginal Discharge The patient's primary symptoms include vaginal discharge. The patient's pertinent negatives include no genital itching, genital rash, missed menses or vaginal bleeding. This is a new problem. The current episode started in the past 7 days. The problem occurs constantly. The problem has been gradually worsening. The pain is mild. Associated symptoms include back pain and nausea. Pertinent negatives include no abdominal pain, flank pain, joint pain or vomiting. The vaginal discharge was copious (tan). Nothing aggravates the symptoms. She has tried nothing for the symptoms. She is sexually active.    Past Medical History:  Diagnosis Date   Acid reflux    Anemia    Bloating    Childhood asthma    History of blood transfusion    PCOS (polycystic ovarian syndrome) 09/27/2014   Preeclampsia    Seasonal allergies     Past Surgical History:  Procedure Laterality Date   BIOPSY  02/26/2021   Procedure: BIOPSY;  Surgeon: Rogene Houston, MD;  [facility];  Service: Endoscopy;;  Esophageal biopsy   carpal tunnel Left 01/30/2021   CESAREAN SECTION N/A 01/02/2017   Procedure: CESAREAN SECTION;  Surgeon: Chancy Milroy, MD;  Location: [place];  Service: Obstetrics;  Laterality: N/A;   CHOLECYSTECTOMY  2011   Dr.Byerly   ESOPHAGEAL DILATION N/A 02/26/2021   Procedure: ESOPHAGEAL DILATION;  Surgeon: Rogene Houston, MD;  [facility];  Service: Endoscopy;  Laterality: N/A;   ESOPHAGOGASTRODUODENOSCOPY N/A 10/26/2014   Procedure: ESOPHAGOGASTRODUODENOSCOPY (EGD);  Surgeon: Rogene Houston, MD;  [facility];  Service: Endoscopy;  Laterality: N/A;   ESOPHAGOGASTRODUODENOSCOPY (EGD) WITH PROPOFOL N/A 02/26/2021   Procedure: ESOPHAGOGASTRODUODENOSCOPY (EGD) WITH  PROPOFOL;  Surgeon: Rogene Houston, MD;  [facility];  Service: Endoscopy;  Laterality: N/A;   POLYPECTOMY  02/26/2021   Procedure: POLYPECTOMY;  Surgeon: Rogene Houston, MD;  [facility];  Service: Endoscopy;;  Gastric Body Polyps x 3    UPPER GASTROINTESTINAL ENDOSCOPY  12/05/2009   UPPER GASTROINTESTINAL ENDOSCOPY  02/07/2009   WISDOM TOOTH EXTRACTION     WISDOM TOOTH EXTRACTION      Family History  Problem Relation Age of Onset   Hypertension Mother    Heart disease Father    Cancer - Cervical Maternal Aunt    Kidney disease Maternal Grandmother    Renal Disease Maternal Grandmother     Social History   Socioeconomic History   Marital status: Married    Spouse name: Not on file   Number of children: 1   Years of education: Not on file   Highest education level: Not on file  Occupational History   Not on file  Tobacco Use   Smoking status: Former    Packs/day: 0.50    Years: 12.00    Pack years: 6.00    Types: Cigarettes    Quit date: 05/05/2016    Years since quitting: 4.9   Smokeless tobacco: Never  Vaping Use   Vaping Use: Never used  Substance and Sexual Activity   Alcohol use: No    Alcohol/week: 0.0 standard drinks   Drug use: No   Sexual activity: Yes    Birth control/protection: None  Other Topics Concern   Not on file  Social History Narrative   Not on file   Social Determinants of Health   Financial Resource Strain: Low Risk    Difficulty of Paying Living Expenses: Not hard at all  Food Insecurity: No Food Insecurity   Worried About Charity fundraiser in the Last Year: Never true   Arboriculturist in the Last Year: Never true  Transportation Needs: No Transportation Needs   Lack of Transportation (Medical): No   Lack of Transportation (Non-Medical): No  Physical Activity: Inactive   Days of Exercise per Week: 0 days   Minutes of Exercise per Session: 0 min  Stress: No Stress Concern Present   Feeling of Stress :  Not at all  Social Connections: Moderately Integrated   Frequency of Communication with Friends and Family: More than three times a week   Frequency of Social Gatherings with Friends and Family: Once a week   Attends Religious Services: 1 to 4 times per year   Active Member of [employer] or Organizations: No   Attends Music therapist: Never   Marital Status: Married  Human resources officer Violence: Not At Risk   Fear of Current or Ex-Partner: No   Emotionally Abused: No   Physically Abused: No   Sexually Abused: No    Outpatient Medications Prior to Visit  Medication Sig Dispense Refill   esomeprazole (NEXIUM) 20 MG capsule Take 20 mg by mouth daily as needed (acid reflux).     diltiazem (CARDIZEM) 30 MG tablet Take one tablet 30 minutes before each meal. 42 tablet 0   esomeprazole (NEXIUM) 20 MG packet Take 20 mg by mouth daily before breakfast. 30 each 12   letrozole (FEMARA) 2.5 MG tablet Take days 3-7 of cycle 5 tablet 2   prenatal vitamin w/FE, FA (PRENATAL 1 + 1) 27-1 MG TABS tablet Take 1 tablet by mouth daily at 12 noon. 30 tablet 12   traMADol (ULTRAM) 50 MG tablet Take 1 tablet (50 mg total) by mouth 3 (three) times daily as needed for moderate pain. 30 tablet 0   No facility-administered medications prior to visit.    Allergies  Allergen Reactions   Percocet [Oxycodone-Acetaminophen] Hives and Itching   Prilosec [Omeprazole] Other (See Comments)    Stomach cramp   Amoxicillin Rash    Has patient had a PCN reaction causing immediate rash, facial/tongue/throat swelling, SOB or lightheadedness with hypotension: Yes -mild rash Has patient had a PCN reaction causing severe rash involving mucus membranes or skin necrosis: No Has patient had a PCN reaction that required hospitalization: No Has patient had a PCN reaction occurring within the last 10 years: Yes If all of the above answers are "NO", then may proceed with Cephalosporin use. Has tolerated ceftriaxone &  cephalexin     Review of Systems  Constitutional: Negative.   HENT: Negative.    Eyes: Negative.   Respiratory: Negative.    Cardiovascular: Negative.   Gastrointestinal:  Positive for nausea. Negative for abdominal pain and vomiting.  Genitourinary:  Positive for vaginal discharge. Negative for flank pain and missed menses.  Musculoskeletal:  Positive for back pain. Negative for joint pain.  All other systems reviewed and are negative.     Objective:    Physical Exam Vitals and nursing note reviewed.  Constitutional:      Appearance: Normal appearance.  HENT:     Head: Normocephalic.     Nose: Nose normal.     Mouth/Throat:     Mouth: Mucous membranes  are moist.     Pharynx: Oropharynx is clear.  Eyes:     Conjunctiva/sclera: Conjunctivae normal.  Cardiovascular:     Rate and Rhythm: Normal rate and regular rhythm.     Pulses: Normal pulses.     Heart sounds: Normal heart sounds.  Pulmonary:     Effort: Pulmonary effort is normal.     Breath sounds: Normal breath sounds.  Abdominal:     General: Bowel sounds are normal.     Tenderness: There is no right CVA tenderness or left CVA tenderness.  Musculoskeletal:     Lumbar back: Tenderness present.  Skin:    General: Skin is warm.     Findings: No rash.  Neurological:     Mental Status: She is alert and oriented to person, place, and time.  Psychiatric:        Behavior: Behavior normal.    BP (!) 146/81   Pulse 80   Temp 97.7 F (36.5 C) (Temporal)   Ht 5' (1.524 m)   Wt 231 lb (104.8 kg)   LMP 03/06/2021 Comment: Urine pregnancy test negative, see Results Review  BMI 45.11 kg/m  Wt Readings from Last 3 Encounters:  04/01/21 231 lb (104.8 kg)  02/26/21 238 lb (108 kg)  02/13/21 235 lb (106.6 kg)    Health Maintenance Due  Topic Date Due   Hepatitis C Screening  Never done    There are no preventive care reminders to display for this patient.   Lab Results  Component Value Date   TSH 1.150  10/30/2020   Lab Results  Component Value Date   WBC 9.2 02/26/2021   HGB 14.6 02/26/2021   HCT 44.6 02/26/2021   MCV 84.5 02/26/2021   PLT 268 02/26/2021   Lab Results  Component Value Date   NA 138 02/26/2021   K 4.4 02/26/2021   CO2 27 02/26/2021   GLUCOSE 104 (H) 02/26/2021   BUN 9 02/26/2021   CREATININE 0.71 02/26/2021   BILITOT 0.8 02/26/2021   ALKPHOS 64 02/26/2021   AST 20 02/26/2021   ALT 28 02/26/2021   PROT 7.6 02/26/2021   ALBUMIN 4.1 02/26/2021   CALCIUM 9.2 02/26/2021   ANIONGAP 9 02/26/2021   EGFR 93 10/30/2020   Lab Results  Component Value Date   CHOL 155 07/20/2017   Lab Results  Component Value Date   HDL 42 07/20/2017   Lab Results  Component Value Date   LDLCALC 96 07/20/2017   Lab Results  Component Value Date   TRIG 86 07/20/2017   Lab Results  Component Value Date   CHOLHDL 3.7 07/20/2017   Lab Results  Component Value Date   HGBA1C 5.2 08/20/2020       Assessment & Plan:   Problem List Items Addressed This Visit       Other   Vaginal discharge - Primary    Vaginal discharge not well controlled, symptoms are recurrent and has not responded to previous treatment.  completed, BV, wet prep, and urinalysis . Results pending. Increase hydration, Tylenol/ibuprophen for pain as needed. Follow up with worsening unresolved symptoms      Relevant Orders   WET PREP FOR [place], YEAST, CLUE (Completed)   BV+Ct/Ng/Tv NAA+Yeast Culture   HIV antibody (with reflex)   GC/Chlamydia probe amp ([place])not at [facility]   GC/Chlamydia probe amp ()not at [facility]   Other Visit Diagnoses     Cloudy urine       Relevant Orders  Urinalysis, Routine w reflex microscopic (Completed)   GC/Chlamydia probe amp ([place])not at [facility]        No orders of the defined types were placed in this encounter.    Ivy Lynn, NP

## 2021-04-01 NOTE — Assessment & Plan Note (Signed)
Vaginal discharge not well controlled, symptoms are recurrent and has not responded to previous treatment.  completed, BV, wet prep, and urinalysis . Results pending. Increase hydration, Tylenol/ibuprophen for pain as needed. Follow up with worsening unresolved symptoms

## 2021-04-03 ENCOUNTER — Encounter: Payer: Self-pay | Admitting: Orthopaedic Surgery

## 2021-04-03 ENCOUNTER — Other Ambulatory Visit: Payer: Self-pay

## 2021-04-03 ENCOUNTER — Encounter: Payer: Medicaid Other | Admitting: Orthopaedic Surgery

## 2021-04-03 ENCOUNTER — Ambulatory Visit (INDEPENDENT_AMBULATORY_CARE_PROVIDER_SITE_OTHER): Payer: Medicaid Other | Admitting: Orthopaedic Surgery

## 2021-04-03 DIAGNOSIS — G5602 Carpal tunnel syndrome, left upper limb: Secondary | ICD-10-CM

## 2021-04-03 LAB — GC/CHLAMYDIA PROBE AMP (~~LOC~~) NOT AT ARMC
Chlamydia: NEGATIVE
Comment: NEGATIVE
Comment: NORMAL
Neisseria Gonorrhea: NEGATIVE

## 2021-04-03 NOTE — Progress Notes (Signed)
Office Visit Note   Patient: Mckenzie Cooley           Date of Birth: [birth year]           MRN: [id number] Visit Date: 04/03/2021              Requested by: Gwenlyn Perking, [place],  [place] [address] PCP: Gwenlyn Perking, FNP   Assessment & Plan: Visit Diagnoses: No diagnosis found.  Plan: Patient is 1 week status post right carpal tunnel release, she has had a similar procedure on the left side.  She has no complaints.  One of her sutures was untied and this was removed today.  A waterproof Band-Aid was placed.  She should continue to wear the wrist splint but also should begin to move her fingers.  She will follow-up in 1 week at which time we will remove the remaining sutures   Follow-Up Instructions: No follow-ups on file.   Orders:  No orders of the defined types were placed in this encounter.  No orders of the defined types were placed in this encounter.     Procedures: No procedures performed   Clinical Data: No additional findings.   Subjective: No chief complaint on file. Patient presents today for follow up on her right wrist. She had carpal tunnel release on 03/27/2021. She is now a week out from surgery. Patient states that she is having a lot of pain, almost constantly until she sits very still. She said that the Tramadol is not helping. She feels this pain is worse than when she had her left side done. The brace makes it hurt more, so she does not wear the brace at home.    Review of Systems  All other systems reviewed and are negative.   Objective: Vital Signs: LMP 03/06/2021 Comment: Urine pregnancy test negative, see Results Review  Physical Exam Constitutional:      Appearance: Normal appearance.  Pulmonary:     Effort: Pulmonary effort is normal.  Neurological:     Mental Status: She is alert.    Ortho Exam Examination of her right hand demonstrates well apposed wound edges.  She has no erythema no ascending cellulitis.   She is able to oppose her pinky to her thumb.  Wound is here healing nicely and 1 suture had become untied so this was removed without difficulty.  A waterproof Band-Aid was applied Specialty Comments:  No specialty comments available.  Imaging: No results found.   PMFS History: Patient Active Problem List   Diagnosis Date Noted   Vaginal discharge 04/01/2021   Routine general medical examination at a health care facility 02/06/2021   Patient desires pregnancy 02/06/2021   Bilateral carpal tunnel syndrome 11/06/2020   Spotting 12/14/2018   Positive urine pregnancy test 12/14/2018   Carpal tunnel syndrome, left upper limb 06/01/2018   Migraine 12/09/2017   Iron deficiency 07/20/2017   Viral upper respiratory tract infection 03/24/2017   Allergic otitis media of both ears 03/24/2017   History of severe pre-eclampsia 02/23/2017   History of anemia 02/23/2017   Previous cesarean section 02/09/2017   H/O severe preeclampsia 02/09/2017   Postoperative anemia due to acute blood loss 01/06/2017   Smoker 09/27/2014   Asthma 07/01/2011   Gastroesophageal reflux disease 07/01/2011   Past Medical History:  Diagnosis Date   Acid reflux    Anemia    Bloating    Childhood asthma    History of blood transfusion  PCOS (polycystic ovarian syndrome) 09/27/2014   Preeclampsia    Seasonal allergies     Family History  Problem Relation Age of Onset   Hypertension Mother    Heart disease Father    Cancer - Cervical Maternal Aunt    Kidney disease Maternal Grandmother    Renal Disease Maternal Grandmother     Past Surgical History:  Procedure Laterality Date   BIOPSY  02/26/2021   Procedure: BIOPSY;  Surgeon: Rogene Houston, MD;  [facility];  Service: Endoscopy;;  Esophageal biopsy   carpal tunnel Left 01/30/2021   CESAREAN SECTION N/A 01/02/2017   Procedure: CESAREAN SECTION;  Surgeon: Chancy Milroy, MD;  Location: [facility];  Service: Obstetrics;   Laterality: N/A;   CHOLECYSTECTOMY  2011   Dr.Byerly   ESOPHAGEAL DILATION N/A 02/26/2021   Procedure: ESOPHAGEAL DILATION;  Surgeon: Rogene Houston, MD;  [facility];  Service: Endoscopy;  Laterality: N/A;   ESOPHAGOGASTRODUODENOSCOPY N/A 10/26/2014   Procedure: ESOPHAGOGASTRODUODENOSCOPY (EGD);  Surgeon: Rogene Houston, MD;  [facility];  Service: Endoscopy;  Laterality: N/A;   ESOPHAGOGASTRODUODENOSCOPY (EGD) WITH PROPOFOL N/A 02/26/2021   Procedure: ESOPHAGOGASTRODUODENOSCOPY (EGD) WITH PROPOFOL;  Surgeon: Rogene Houston, MD;  [facility];  Service: Endoscopy;  Laterality: N/A;   POLYPECTOMY  02/26/2021   Procedure: POLYPECTOMY;  Surgeon: Rogene Houston, MD;  [facility];  Service: Endoscopy;;  Gastric Body Polyps x 3    UPPER GASTROINTESTINAL ENDOSCOPY  12/05/2009   UPPER GASTROINTESTINAL ENDOSCOPY  02/07/2009   WISDOM TOOTH EXTRACTION     WISDOM TOOTH EXTRACTION     Social History   Occupational History   Not on file  Tobacco Use   Smoking status: Former    Packs/day: 0.50    Years: 12.00    Pack years: 6.00    Types: Cigarettes    Quit date: 05/05/2016    Years since quitting: 4.9   Smokeless tobacco: Never  Vaping Use   Vaping Use: Never used  Substance and Sexual Activity   Alcohol use: No    Alcohol/week: 0.0 standard drinks   Drug use: No   Sexual activity: Yes    Birth control/protection: None

## 2021-04-04 ENCOUNTER — Telehealth: Payer: Self-pay | Admitting: Family Medicine

## 2021-04-04 NOTE — Telephone Encounter (Signed)
Pt called to check and see if all of her lab results had come back and if Je was going to call in an antibiotic for her for her yeast infection?  Please advise and call patient.

## 2021-04-06 ENCOUNTER — Other Ambulatory Visit: Payer: Self-pay | Admitting: Nurse Practitioner

## 2021-04-06 MED ORDER — FLUCONAZOLE 150 MG PO TABS: 150.0000 mg | ORAL_TABLET | Freq: Once | ORAL | 0 refills | Status: AC

## 2021-04-07 ENCOUNTER — Encounter: Payer: Self-pay | Admitting: Nurse Practitioner

## 2021-04-07 ENCOUNTER — Telehealth: Payer: Self-pay | Admitting: Family Medicine

## 2021-04-07 NOTE — Telephone Encounter (Signed)
LMTCB

## 2021-04-07 NOTE — Telephone Encounter (Signed)
Pt was seen on 10/11 and is wanting an antibiotic for infection. Please call back and advise.

## 2021-04-07 NOTE — Telephone Encounter (Signed)
Pt aware of provider feedback and voiced understanding. See note below from provider.    Diflucan sent to Pharmacy for yeast on wet prep. Urinalysis has nothing significant or UTI. Specific gravity was high, please increase hydration(water).

## 2021-04-08 ENCOUNTER — Other Ambulatory Visit: Payer: Self-pay | Admitting: Nurse Practitioner

## 2021-04-09 ENCOUNTER — Ambulatory Visit (INDEPENDENT_AMBULATORY_CARE_PROVIDER_SITE_OTHER): Payer: Medicaid Other | Admitting: Orthopaedic Surgery

## 2021-04-09 ENCOUNTER — Ambulatory Visit: Payer: Medicaid Other | Admitting: Plastic Surgery

## 2021-04-09 ENCOUNTER — Encounter: Payer: Self-pay | Admitting: Orthopaedic Surgery

## 2021-04-09 ENCOUNTER — Encounter: Payer: Medicaid Other | Admitting: Orthopaedic Surgery

## 2021-04-09 ENCOUNTER — Other Ambulatory Visit: Payer: Self-pay

## 2021-04-09 VITALS — BP 129/74 | HR 67 | Ht 60.0 in | Wt 228.8 lb

## 2021-04-09 DIAGNOSIS — N62 Hypertrophy of breast: Secondary | ICD-10-CM | POA: Diagnosis not present

## 2021-04-09 DIAGNOSIS — M545 Low back pain, unspecified: Secondary | ICD-10-CM | POA: Diagnosis not present

## 2021-04-09 DIAGNOSIS — M4004 Postural kyphosis, thoracic region: Secondary | ICD-10-CM

## 2021-04-09 DIAGNOSIS — M546 Pain in thoracic spine: Secondary | ICD-10-CM | POA: Diagnosis not present

## 2021-04-09 DIAGNOSIS — G5603 Carpal tunnel syndrome, bilateral upper limbs: Secondary | ICD-10-CM

## 2021-04-09 NOTE — Progress Notes (Signed)
Referring Provider Gwenlyn Perking, [place],   [id number]   CC:  Chief Complaint  Patient presents with   Advice Only      Mckenzie Cooley is an 30 y.o. female.  HPI: Patient presents to discuss breast reduction.  She has had years of back pain, neck pain and shoulder grooving related to her large breast.  She tried over-the-counter medications, warm packs, cold packs and supportive bras with little relief.  She also gets rashes beneath her breasts have been refractory to over-the-counter treatments.  She is currently an F cup and wants to be around a D cup.  She has no family history of breast cancer and no previous breast biopsies or procedures.  She does not smoke and is not a diabetic.  She has been to physical therapy before during this year.  Allergies  Allergen Reactions   Percocet [Oxycodone-Acetaminophen] Hives and Itching   Prilosec [Omeprazole] Other (See Comments)    Stomach cramp   Amoxicillin Rash    Has patient had a PCN reaction causing immediate rash, facial/tongue/throat swelling, SOB or lightheadedness with hypotension: Yes -mild rash Has patient had a PCN reaction causing severe rash involving mucus membranes or skin necrosis: No Has patient had a PCN reaction that required hospitalization: No Has patient had a PCN reaction occurring within the last 10 years: Yes If all of the above answers are "NO", then may proceed with Cephalosporin use. Has tolerated ceftriaxone & cephalexin     Outpatient Encounter Medications as of 04/09/2021  Medication Sig   esomeprazole (NEXIUM) 20 MG capsule Take 20 mg by mouth daily as needed (acid reflux).   No facility-administered encounter medications on file as of 04/09/2021.     Past Medical History:  Diagnosis Date   Acid reflux    Anemia    Bloating    Childhood asthma    History of blood transfusion    PCOS (polycystic ovarian syndrome) 09/27/2014   Preeclampsia    Seasonal allergies     Past  Surgical History:  Procedure Laterality Date   BIOPSY  02/26/2021   Procedure: BIOPSY;  Surgeon: Rogene Houston, MD;  [facility];  Service: Endoscopy;;  Esophageal biopsy   carpal tunnel Left 01/30/2021   CESAREAN SECTION N/A 01/02/2017   Procedure: CESAREAN SECTION;  Surgeon: Chancy Milroy, MD;  Location: [facility];  Service: Obstetrics;  Laterality: N/A;   CHOLECYSTECTOMY  2011   Dr.Byerly   ESOPHAGEAL DILATION N/A 02/26/2021   Procedure: ESOPHAGEAL DILATION;  Surgeon: Rogene Houston, MD;  [facility];  Service: Endoscopy;  Laterality: N/A;   ESOPHAGOGASTRODUODENOSCOPY N/A 10/26/2014   Procedure: ESOPHAGOGASTRODUODENOSCOPY (EGD);  Surgeon: Rogene Houston, MD;  [facility];  Service: Endoscopy;  Laterality: N/A;   ESOPHAGOGASTRODUODENOSCOPY (EGD) WITH PROPOFOL N/A 02/26/2021   Procedure: ESOPHAGOGASTRODUODENOSCOPY (EGD) WITH PROPOFOL;  Surgeon: Rogene Houston, MD;  [facility];  Service: Endoscopy;  Laterality: N/A;   POLYPECTOMY  02/26/2021   Procedure: POLYPECTOMY;  Surgeon: Rogene Houston, MD;  [facility];  Service: Endoscopy;;  Gastric Body Polyps x 3    UPPER GASTROINTESTINAL ENDOSCOPY  12/05/2009   UPPER GASTROINTESTINAL ENDOSCOPY  02/07/2009   WISDOM TOOTH EXTRACTION     WISDOM TOOTH EXTRACTION      Family History  Problem Relation Age of Onset   Hypertension Mother    Heart disease Father    Cancer - Cervical  Maternal Aunt    Kidney disease Maternal Grandmother    Renal Disease Maternal Grandmother     Social History   Social History Narrative   Not on file     Review of Systems General: Denies fevers, chills, weight loss CV: Denies chest pain, shortness of breath, palpitations  Physical Exam Vitals with BMI 04/09/2021 04/01/2021 02/26/2021  Height 5\' 0"  5\' 0"  -  Weight 228 lbs 13 oz 231 lbs -  BMI 32.99 24.26 -  Systolic 834 196 222  Diastolic 74 81 73  Pulse 67 80 70    General:  No  acute distress,  Alert and oriented, Non-Toxic, Normal speech and affect Breast: She has grade 3 ptosis.  Sternal notch to nipple is 37 cm bilaterally.  Nipple to fold is 19 cm bilaterally.  No obvious scars or masses.  Assessment/Plan The patient has bilateral symptomatic macromastia.  She is a good candidate for a breast reduction.  She is interested in pursuing surgical treatment.  She has tried supportive garments and fitted bras with no relief.  The details of breast reduction surgery were discussed.  I explained the procedure in detail along the with the expected scars.  The risks were discussed in detail and include bleeding, infection, damage to surrounding structures, need for additional procedures, nipple loss, change in nipple sensation, persistent pain, contour irregularities and asymmetries.  I explained that breast feeding is often not possible after breast reduction surgery.  We discussed the expected postoperative course with an overall recovery period of about 1 month.  She demonstrated full understanding of all risks.  We discussed her personal risk factors that include BMI.  The patient is interested in pursuing surgical treatment.  I anticipate approximately 800 g of tissue removed from each side.   [name] 04/09/2021, 11:28 AM

## 2021-04-09 NOTE — Progress Notes (Signed)
Office Visit Note   Patient: Mckenzie Cooley           Date of Birth: 1990-08-30           MRN: [id number] Visit Date: 04/09/2021              Requested by: Gwenlyn Perking, [place],  [place] [address] PCP: Gwenlyn Perking, FNP   Assessment & Plan: Visit Diagnoses:  1. Bilateral carpal tunnel syndrome     Plan: 2-week status post right carpal tunnel release and doing well.  Wound is healing nicely.  Remaining stitches were removed.  Steri-Strips applied over benzoin.  Has full range of motion of her fingers neurologically intact.  Discussed activity modification.  Like to see her in 2 weeks  Follow-Up Instructions: Return in about 2 weeks (around 04/23/2021).   Orders:  No orders of the defined types were placed in this encounter.  No orders of the defined types were placed in this encounter.     Procedures: No procedures performed   Clinical Data: No additional findings.   Subjective: Chief Complaint  Patient presents with   Left Wrist - Follow-up    Left carpal tunnel release 03/27/2021  Patient presents today for a one week follow up on her left wrist. She is now two weeks out from left carpal tunnel release. Her surgery was on 03/27/2021. She said that she cannot wear the brace. She is still experiencing some pain but notes that she no longer has the pain no other numbness or tingling in the radial 3 digits  HPI  Review of Systems   Objective: Vital Signs: There were no vitals taken for this visit.  Physical Exam  Ortho Exam right hand incisions healing nicely.  Remaining stitches stitches removed and Steri-Strips applied.  Excellent capillary refill to all of her fingers neurologically intact.  Good opposition of thumb the little finger.  Able to make a full fist  Specialty Comments:  No specialty comments available.  Imaging: No results found.   PMFS History: Patient Active Problem List   Diagnosis Date Noted   Vaginal discharge  04/01/2021   Routine general medical examination at a health care facility 02/06/2021   Patient desires pregnancy 02/06/2021   Bilateral carpal tunnel syndrome 11/06/2020   Spotting 12/14/2018   Positive urine pregnancy test 12/14/2018   Carpal tunnel syndrome, left upper limb 06/01/2018   Migraine 12/09/2017   Iron deficiency 07/20/2017   Viral upper respiratory tract infection 03/24/2017   Allergic otitis media of both ears 03/24/2017   History of severe pre-eclampsia 02/23/2017   History of anemia 02/23/2017   Previous cesarean section 02/09/2017   H/O severe preeclampsia 02/09/2017   Postoperative anemia due to acute blood loss 01/06/2017   Smoker 09/27/2014   Asthma 07/01/2011   Gastroesophageal reflux disease 07/01/2011   Past Medical History:  Diagnosis Date   Acid reflux    Anemia    Bloating    Childhood asthma    History of blood transfusion    PCOS (polycystic ovarian syndrome) 09/27/2014   Preeclampsia    Seasonal allergies     Family History  Problem Relation Age of Onset   Hypertension Mother    Heart disease Father    Cancer - Cervical Maternal Aunt    Kidney disease Maternal Grandmother    Renal Disease Maternal Grandmother     Past Surgical History:  Procedure Laterality Date   BIOPSY  02/26/2021  Procedure: BIOPSY;  Surgeon: Rogene Houston, MD;  [facility];  Service: Endoscopy;;  Esophageal biopsy   carpal tunnel Left 01/30/2021   CESAREAN SECTION N/A 01/02/2017   Procedure: CESAREAN SECTION;  Surgeon: Chancy Milroy, MD;  Location: [facility];  Service: Obstetrics;  Laterality: N/A;   CHOLECYSTECTOMY  2011   Dr.Byerly   ESOPHAGEAL DILATION N/A 02/26/2021   Procedure: ESOPHAGEAL DILATION;  Surgeon: Rogene Houston, MD;  [facility];  Service: Endoscopy;  Laterality: N/A;   ESOPHAGOGASTRODUODENOSCOPY N/A 10/26/2014   Procedure: ESOPHAGOGASTRODUODENOSCOPY (EGD);  Surgeon: Rogene Houston, MD;  [facility];   Service: Endoscopy;  Laterality: N/A;   ESOPHAGOGASTRODUODENOSCOPY (EGD) WITH PROPOFOL N/A 02/26/2021   Procedure: ESOPHAGOGASTRODUODENOSCOPY (EGD) WITH PROPOFOL;  Surgeon: Rogene Houston, MD;  [facility];  Service: Endoscopy;  Laterality: N/A;   POLYPECTOMY  02/26/2021   Procedure: POLYPECTOMY;  Surgeon: Rogene Houston, MD;  [facility];  Service: Endoscopy;;  Gastric Body Polyps x 3    UPPER GASTROINTESTINAL ENDOSCOPY  12/05/2009   UPPER GASTROINTESTINAL ENDOSCOPY  02/07/2009   WISDOM TOOTH EXTRACTION     WISDOM TOOTH EXTRACTION     Social History   Occupational History   Not on file  Tobacco Use   Smoking status: Former    Packs/day: 0.50    Years: 12.00    Pack years: 6.00    Types: Cigarettes    Quit date: 05/05/2016    Years since quitting: 4.9   Smokeless tobacco: Never  Vaping Use   Vaping Use: Never used  Substance and Sexual Activity   Alcohol use: No    Alcohol/week: 0.0 standard drinks   Drug use: No   Sexual activity: Yes    Birth control/protection: None

## 2021-04-11 ENCOUNTER — Encounter: Payer: Self-pay | Admitting: Family Medicine

## 2021-04-11 ENCOUNTER — Ambulatory Visit (INDEPENDENT_AMBULATORY_CARE_PROVIDER_SITE_OTHER): Payer: Medicaid Other | Admitting: Family Medicine

## 2021-04-11 DIAGNOSIS — R5383 Other fatigue: Secondary | ICD-10-CM

## 2021-04-11 DIAGNOSIS — B3731 Acute candidiasis of vulva and vagina: Secondary | ICD-10-CM

## 2021-04-11 MED ORDER — FLUCONAZOLE 150 MG PO TABS: 150.0000 mg | ORAL_TABLET | ORAL | 0 refills | Status: DC

## 2021-04-11 NOTE — Progress Notes (Signed)
Virtual Visit  Note Due to COVID-19 pandemic this visit was conducted virtually. This visit type was conducted due to national recommendations for restrictions regarding the COVID-19 Pandemic (e.g. social distancing, sheltering in place) in an effort to limit this patient's exposure and mitigate transmission in our community. All issues noted in this document were discussed and addressed.  A physical exam was not performed with this format.  I connected with Mckenzie Cooley on 04/11/21 at 1336 by telephone and verified that I am speaking with the correct person using two identifiers. Mckenzie Cooley is currently located in her car and no one is currently with her during the visit. The provider, Gwenlyn Perking, FNP is located in their office at time of visit.  I discussed the limitations, risks, security and privacy concerns of performing an evaluation and management service by telephone and the availability of in person appointments. I also discussed with the patient that there may be a patient responsible charge related to this service. The patient expressed understanding and agreed to proceed.  CC: yeast infection  History and Present Illness:  HPI [name] reports having a yeast infection that started after her last cycle about 3 weeks ago. She has tried diflucan twice without improvement. She continues to have increased clear discharge, pelvic pain, and back pain. She had a normal pelvic exam and pap with her OB/GYN 2 months ago. She was negative for BV and STD screening for her appointment last week. Denies urinary symptoms or fever.   She also has been feeling very fatigued lately. She has been seeing GI for issues with her esophagus. Since then she has not been eating much. She would like to have labs done.    ROS As per HPI.   Observations/Objective: Alert and oriented x 3. Able to speak in full sentences without difficulty.    Assessment and Plan: Mckenzie Cooley was seen today for  vaginitis.  Diagnoses and all orders for this visit:  Yeast vaginitis + yeast on 04/01/21. Will try diflucan as below. Discussed if symptoms do not improve or worsen, she needs to be seen in office for a pelvic exam either here or with her GYN.  -     fluconazole (DIFLUCAN) 150 MG tablet; Take 1 tablet (150 mg total) by mouth once a week for 8 doses.  Other fatigue Labs pending as below. Will notify patient of results.  -     CMP14+EGFR -     CBC with Differential/Platelet -     TSH    Follow Up Instructions: Return to office for new or worsening symptoms, or if symptoms persist.     I discussed the assessment and treatment plan with the patient. The patient was provided an opportunity to ask questions and all were answered. The patient agreed with the plan and demonstrated an understanding of the instructions.   The patient was advised to call back or seek an in-person evaluation if the symptoms worsen or if the condition fails to improve as anticipated.  The above assessment and management plan was discussed with the patient. The patient verbalized understanding of and has agreed to the management plan. Patient is aware to call the clinic if symptoms persist or worsen. Patient is aware when to return to the clinic for a follow-up visit. Patient educated on when it is appropriate to go to the emergency department.   Time call ended:  1349  I provided 13 minutes of  non face-to-face time during this encounter.  Gwenlyn Perking, FNP

## 2021-04-14 ENCOUNTER — Other Ambulatory Visit: Payer: Self-pay

## 2021-04-14 ENCOUNTER — Other Ambulatory Visit: Payer: Medicaid Other

## 2021-04-14 DIAGNOSIS — R5383 Other fatigue: Secondary | ICD-10-CM | POA: Diagnosis not present

## 2021-04-15 LAB — CBC WITH DIFFERENTIAL/PLATELET
Basophils Absolute: 0 10*3/uL (ref 0.0–0.2)
Basos: 0 %
EOS (ABSOLUTE): 0.1 10*3/uL (ref 0.0–0.4)
Eos: 1 %
Hematocrit: 41.7 % (ref 34.0–46.6)
Hemoglobin: 13.9 g/dL (ref 11.1–15.9)
Immature Grans (Abs): 0 10*3/uL (ref 0.0–0.1)
Immature Granulocytes: 0 %
Lymphocytes Absolute: 2.6 10*3/uL (ref 0.7–3.1)
Lymphs: 27 %
MCH: 27.9 pg (ref 26.6–33.0)
MCHC: 33.3 g/dL (ref 31.5–35.7)
MCV: 84 fL (ref 79–97)
Monocytes Absolute: 0.8 10*3/uL (ref 0.1–0.9)
Monocytes: 8 %
Neutrophils Absolute: 5.9 10*3/uL (ref 1.4–7.0)
Neutrophils: 64 %
Platelets: 300 10*3/uL (ref 150–450)
RBC: 4.98 x10E6/uL (ref 3.77–5.28)
RDW: 13.6 % (ref 11.7–15.4)
WBC: 9.3 10*3/uL (ref 3.4–10.8)

## 2021-04-15 LAB — CMP14+EGFR
ALT: 29 IU/L (ref 0–32)
AST: 23 IU/L (ref 0–40)
Albumin/Globulin Ratio: 1.5 (ref 1.2–2.2)
Albumin: 4.3 g/dL (ref 3.9–5.0)
Alkaline Phosphatase: 69 IU/L (ref 44–121)
BUN/Creatinine Ratio: 13 (ref 9–23)
BUN: 11 mg/dL (ref 6–20)
Bilirubin Total: 0.6 mg/dL (ref 0.0–1.2)
CO2: 20 mmol/L (ref 20–29)
Calcium: 9.5 mg/dL (ref 8.7–10.2)
Chloride: 106 mmol/L (ref 96–106)
Creatinine, Ser: 0.82 mg/dL (ref 0.57–1.00)
Globulin, Total: 2.9 g/dL (ref 1.5–4.5)
Glucose: 90 mg/dL (ref 70–99)
Potassium: 4.4 mmol/L (ref 3.5–5.2)
Sodium: 140 mmol/L (ref 134–144)
Total Protein: 7.2 g/dL (ref 6.0–8.5)
eGFR: 99 mL/min/{1.73_m2} (ref >59)

## 2021-04-15 LAB — TSH: TSH: 1.13 u[IU]/mL (ref 0.450–4.500)

## 2021-04-22 ENCOUNTER — Other Ambulatory Visit: Payer: Self-pay

## 2021-04-22 ENCOUNTER — Ambulatory Visit: Payer: Medicaid Other | Admitting: Adult Health

## 2021-04-22 ENCOUNTER — Other Ambulatory Visit (HOSPITAL_COMMUNITY)
Admission: RE | Admit: 2021-04-22 | Discharge: 2021-04-22 | Disposition: A | Discharge status: Home / Self Care | Payer: Medicaid Other | Source: Ambulatory Visit | Attending: Adult Health | Admitting: Adult Health

## 2021-04-22 ENCOUNTER — Encounter: Payer: Self-pay | Admitting: Adult Health

## 2021-04-22 VITALS — BP 138/66 | HR 59 | Ht 60.0 in | Wt 227.4 lb

## 2021-04-22 DIAGNOSIS — N898 Other specified noninflammatory disorders of vagina: Secondary | ICD-10-CM | POA: Diagnosis not present

## 2021-04-22 NOTE — Progress Notes (Signed)
  Subjective:     Patient ID: Mckenzie Cooley, female   DOB: 12-01-1990, 30 y.o.   MRN: [id number]  HPI Mckenzie Cooley is a 30 year old white female,married, [id number] in complaining of having had vaginal discharge and irritation since period in September and pain with sex at times.Is taking diflucan now and feels better since this period started. Lab Results  Component Value Date   DIAGPAP  02/06/2021    - Negative for intraepithelial lesion or malignancy (NILM)   [facility] Negative 02/06/2021    PCP is Marjorie Smolder NP  Lab Results  Component Value Date   DIAGPAP  02/06/2021    - Negative for intraepithelial lesion or malignancy (NILM)   [facility] Negative 02/06/2021    Review of Systems +vaginal discharge and irritation, but better since period started. Has pain with deep penetration Reviewed past medical,surgical, social and family history. Reviewed medications and allergies.     Objective:   Physical Exam BP 138/66 (BP Location: Right Arm, Patient Position: Sitting, Cuff Size: Large)   Pulse (!) 59   Ht 5' (1.524 m)   Wt 227 lb 6.4 oz (103.1 kg)   LMP 04/16/2021 (Exact Date)   BMI 44.41 kg/m  Skin warm and dry.Pelvic: external genitalia is normal in appearance no lesions, vagina: period blood, no odor,urethra has no lesions or masses noted, cervix:smooth and bulbous, uterus: normal size, shape and contour, non tender, no masses felt, adnexa: no masses or tenderness noted. Bladder is non tender and no masses felt. CV swab obtained. Fall risk is low  [facility] - 04/22/21 1059       Pregnancy Intention Screening   Does the patient want to become pregnant in the next year? Unsure    Does the patient's partner want to become pregnant in the next year? Unsure    Would the patient like to discuss contraceptive options today? No      Contraception Wrap Up   Current Method Female Condom    End Method Female Condom    Contraception Counseling Provided No            Examination chaperoned by  Glenard Haring RN     Assessment:     1. Vaginal discharge CV swab sent for GC/CHL,trich and BV,yeast - Cervicovaginal ancillary only( [place])  2. Vaginal irritation  - Cervicovaginal ancillary only( [place])      Plan:     Call returns  Follow up prn

## 2021-04-23 ENCOUNTER — Ambulatory Visit (INDEPENDENT_AMBULATORY_CARE_PROVIDER_SITE_OTHER): Payer: Medicaid Other | Admitting: Orthopaedic Surgery

## 2021-04-23 ENCOUNTER — Encounter: Payer: Self-pay | Admitting: Orthopaedic Surgery

## 2021-04-23 DIAGNOSIS — G5603 Carpal tunnel syndrome, bilateral upper limbs: Secondary | ICD-10-CM

## 2021-04-23 LAB — CERVICOVAGINAL ANCILLARY ONLY
Bacterial Vaginitis (gardnerella): NEGATIVE
Candida Glabrata: NEGATIVE
Candida Vaginitis: NEGATIVE
Chlamydia: NEGATIVE
Comment: NEGATIVE
Comment: NEGATIVE
Comment: NEGATIVE
Comment: NEGATIVE
Comment: NEGATIVE
Comment: NORMAL
Neisseria Gonorrhea: NEGATIVE
Trichomonas: NEGATIVE

## 2021-04-23 NOTE — Progress Notes (Signed)
Office Visit Note   Patient: Mckenzie Cooley           Date of Birth: Sep 04, 1990           MRN: [id number] Visit Date: 04/23/2021              Requested by: Gwenlyn Perking, [place],  [place] [address] PCP: Gwenlyn Perking, FNP   Assessment & Plan: Visit Diagnoses:  1. Bilateral carpal tunnel syndrome     Plan: 1 month status post right carpal tunnel release and doing relatively well.  She does not have the numbness and tingling she had preoperatively but still having a little problem with the scar.  She is using a "homemade remedy" with massage but it still burns and it still little bit firm.  She is able to make a full fist and release and has normal opposition of thumb the little finger.  I think is just a matter of healing and should continue with the scar massage and even wear the splint if it is uncomfortable to apply pressure.  Like to see her 1 more time in a month  Follow-Up Instructions: Return in about 1 month (around 05/23/2021).   Orders:  No orders of the defined types were placed in this encounter.  No orders of the defined types were placed in this encounter.     Procedures: No procedures performed   Clinical Data: No additional findings.   Subjective: Chief Complaint  Patient presents with   Right Wrist - Follow-up    Right carpal tunnel release 03/27/2021  Patient presents today for follow up on her right wrist. She is now almost a month out from right carpal tunnel release. Her surgery was on 03/27/2021. She states that she is still having a lot of pain. She has experienced an intense "burning" with overuse of that hand. She said that she has a knot at the incision. She is taking Tylenol but that does not really help. When moving her index and thumb she has more pain. She is concerned with her progress and questions if this is normal.  HPI  Review of Systems   Objective: Vital Signs: LMP 04/16/2021 (Exact Date)   Physical  Exam  Ortho Exam right hand with well-healed carpal tunnel incision is still quite firm and indurated and it is a little bit red but no evidence of infection.  Good opposition of thumb little finger.  Able to make a full fist with good capillary refill and normal sensibility  Specialty Comments:  No specialty comments available.  Imaging: No results found.   PMFS History: Patient Active Problem List   Diagnosis Date Noted   Vaginal irritation 04/22/2021   Vaginal discharge 04/01/2021   Routine general medical examination at a health care facility 02/06/2021   Patient desires pregnancy 02/06/2021   Bilateral carpal tunnel syndrome 11/06/2020   Spotting 12/14/2018   Positive urine pregnancy test 12/14/2018   Carpal tunnel syndrome, left upper limb 06/01/2018   Migraine 12/09/2017   Iron deficiency 07/20/2017   Viral upper respiratory tract infection 03/24/2017   Allergic otitis media of both ears 03/24/2017   History of severe pre-eclampsia 02/23/2017   History of anemia 02/23/2017   Previous cesarean section 02/09/2017   H/O severe preeclampsia 02/09/2017   Postoperative anemia due to acute blood loss 01/06/2017   Smoker 09/27/2014   Asthma 07/01/2011   Gastroesophageal reflux disease 07/01/2011   Past Medical History:  Diagnosis Date  Acid reflux    Anemia    Bloating    Childhood asthma    History of blood transfusion    PCOS (polycystic ovarian syndrome) 09/27/2014   Preeclampsia    Seasonal allergies     Family History  Problem Relation Age of Onset   Hypertension Mother    Heart disease Father    Cancer - Cervical Maternal Aunt    Kidney disease Maternal Grandmother    Renal Disease Maternal Grandmother     Past Surgical History:  Procedure Laterality Date   BIOPSY  02/26/2021   Procedure: BIOPSY;  Surgeon: Rogene Houston, MD;  [facility];  Service: Endoscopy;;  Esophageal biopsy   carpal tunnel Left 01/30/2021   CARPAL TUNNEL RELEASE Right  03/27/2021   CESAREAN SECTION N/A 01/02/2017   Procedure: CESAREAN SECTION;  Surgeon: Chancy Milroy, MD;  Location: [facility];  Service: Obstetrics;  Laterality: N/A;   CHOLECYSTECTOMY  2011   Dr.Byerly   ESOPHAGEAL DILATION N/A 02/26/2021   Procedure: ESOPHAGEAL DILATION;  Surgeon: Rogene Houston, MD;  [facility];  Service: Endoscopy;  Laterality: N/A;   ESOPHAGOGASTRODUODENOSCOPY N/A 10/26/2014   Procedure: ESOPHAGOGASTRODUODENOSCOPY (EGD);  Surgeon: Rogene Houston, MD;  [facility];  Service: Endoscopy;  Laterality: N/A;   ESOPHAGOGASTRODUODENOSCOPY (EGD) WITH PROPOFOL N/A 02/26/2021   Procedure: ESOPHAGOGASTRODUODENOSCOPY (EGD) WITH PROPOFOL;  Surgeon: Rogene Houston, MD;  [facility];  Service: Endoscopy;  Laterality: N/A;   POLYPECTOMY  02/26/2021   Procedure: POLYPECTOMY;  Surgeon: Rogene Houston, MD;  [facility];  Service: Endoscopy;;  Gastric Body Polyps x 3    UPPER GASTROINTESTINAL ENDOSCOPY  12/05/2009   UPPER GASTROINTESTINAL ENDOSCOPY  02/07/2009   WISDOM TOOTH EXTRACTION     WISDOM TOOTH EXTRACTION     Social History   Occupational History   Not on file  Tobacco Use   Smoking status: Former    Packs/day: 0.50    Years: 12.00    Pack years: 6.00    Types: Cigarettes    Quit date: 05/05/2016    Years since quitting: 4.9   Smokeless tobacco: Never  Vaping Use   Vaping Use: Never used  Substance and Sexual Activity   Alcohol use: No    Alcohol/week: 0.0 standard drinks   Drug use: No   Sexual activity: Yes    Birth control/protection: Condom

## 2021-04-25 NOTE — Telephone Encounter (Signed)
Unable to reach patient.  According to chart, patient saw ob-gyn on 04/22/21.

## 2021-04-29 ENCOUNTER — Encounter (INDEPENDENT_AMBULATORY_CARE_PROVIDER_SITE_OTHER): Payer: Self-pay

## 2021-05-02 ENCOUNTER — Encounter (INDEPENDENT_AMBULATORY_CARE_PROVIDER_SITE_OTHER): Payer: Self-pay

## 2021-05-02 DIAGNOSIS — F458 Other somatoform disorders: Secondary | ICD-10-CM | POA: Diagnosis not present

## 2021-05-02 DIAGNOSIS — R1319 Other dysphagia: Secondary | ICD-10-CM | POA: Diagnosis not present

## 2021-05-02 DIAGNOSIS — R1110 Vomiting, unspecified: Secondary | ICD-10-CM | POA: Diagnosis not present

## 2021-05-02 DIAGNOSIS — R0989 Other specified symptoms and signs involving the circulatory and respiratory systems: Secondary | ICD-10-CM | POA: Diagnosis not present

## 2021-05-02 DIAGNOSIS — R1310 Dysphagia, unspecified: Secondary | ICD-10-CM | POA: Diagnosis not present

## 2021-05-06 ENCOUNTER — Other Ambulatory Visit (INDEPENDENT_AMBULATORY_CARE_PROVIDER_SITE_OTHER): Payer: Self-pay | Admitting: Internal Medicine

## 2021-05-06 DIAGNOSIS — K224 Dyskinesia of esophagus: Secondary | ICD-10-CM

## 2021-05-06 NOTE — Progress Notes (Signed)
Results of high-resolution manometry reviewed with patient. She has ineffective esophageal motility disorder Patient states she has lost 40 pounds in the last 2 months.  She is on liquids diet. Family history negative for autoimmune disease. Will screen for scleroderma and arrange for consultation with Dr. Roosvelt Maser of [facility]

## 2021-05-06 NOTE — Telephone Encounter (Signed)
Called pt. She states she is no longer able to eat mashed potatoes and gravy because it is now getting stuck also. She states last week she stopped taking acid reflux pill because it got stuck for 2 hours. Has been drinking two milkshakes a day from [employer] and states she has a bacterial and yeast infection from too much sugar intake. States she has had this since September. Going to go get some boost today because she is feeling weak. States about one and a half weeks ago she started feeling like her esophagus is sticking together when she lays down a certain way at night.   Pt would like to see if dr Laural Golden would give her a call with results of test done at [facility].  [phone]

## 2021-05-07 ENCOUNTER — Encounter: Payer: Self-pay | Admitting: Family Medicine

## 2021-05-07 ENCOUNTER — Ambulatory Visit (INDEPENDENT_AMBULATORY_CARE_PROVIDER_SITE_OTHER): Payer: Medicaid Other | Admitting: Family Medicine

## 2021-05-07 DIAGNOSIS — K224 Dyskinesia of esophagus: Secondary | ICD-10-CM | POA: Diagnosis not present

## 2021-05-07 DIAGNOSIS — J069 Acute upper respiratory infection, unspecified: Secondary | ICD-10-CM

## 2021-05-07 MED ORDER — CETIRIZINE HCL 5 MG/5ML PO SOLN: 10.0000 mg | Freq: Every day | ORAL | 0 refills | Status: DC

## 2021-05-07 NOTE — Progress Notes (Signed)
   Virtual Visit  Note Due to COVID-19 pandemic this visit was conducted virtually. This visit type was conducted due to national recommendations for restrictions regarding the COVID-19 Pandemic (e.g. social distancing, sheltering in place) in an effort to limit this patient's exposure and mitigate transmission in our community. All issues noted in this document were discussed and addressed.  A physical exam was not performed with this format.  I connected with Mckenzie Cooley on 05/07/21 at 1103 by telephone and verified that I am speaking with the correct person using two identifiers. Mckenzie Cooley is currently located at home and her daughter is currently with her during visit. The provider, Gwenlyn Perking, FNP is located in their office at time of visit.  I discussed the limitations, risks, security and privacy concerns of performing an evaluation and management service by telephone and the availability of in person appointments. I also discussed with the patient that there may be a patient responsible charge related to this service. The patient expressed understanding and agreed to proceed.  CC: congestion  History and Present Illness:  HPI Mckenzie Cooley reports nasal congestion, cough, and HA for 4-5 days. Her symptoms are mostly at night. Her temperature has been around 100 in the evenings as well. She has been taking tylenol for her symptoms. She currently is unable to swallow pills due to issues with her esophagus that she is having. She has been managed by GI for his and has been referred to a specialist with [facility].     ROS As per HPI.    Observations/Objective: Alert and oriented x 3. Able to speak in full sentences without difficulty.   Assessment and Plan: Mckenzie Cooley was seen today for nasal congestion.  Diagnoses and all orders for this visit:  URI with cough and congestion Zyrtec ordered for symptomatic management. Return to office for new or worsening symptoms, or if symptoms  persist.  -     cetirizine HCl (ZYRTEC) 5 MG/5ML SOLN; Take 10 mLs (10 mg total) by mouth daily.   Follow Up Instructions: As needed.     I discussed the assessment and treatment plan with the patient. The patient was provided an opportunity to ask questions and all were answered. The patient agreed with the plan and demonstrated an understanding of the instructions.   The patient was advised to call back or seek an in-person evaluation if the symptoms worsen or if the condition fails to improve as anticipated.  The above assessment and management plan was discussed with the patient. The patient verbalized understanding of and has agreed to the management plan. Patient is aware to call the clinic if symptoms persist or worsen. Patient is aware when to return to the clinic for a follow-up visit. Patient educated on when it is appropriate to go to the emergency department.   Time call ended:  1115  I provided 12 minutes of  non face-to-face time during this encounter.    Gwenlyn Perking, FNP

## 2021-05-08 ENCOUNTER — Ambulatory Visit: Payer: Medicaid Other | Admitting: Family Medicine

## 2021-05-11 LAB — ANA+ENA+DNA/DS+SCL 70+SJOSSA/B
ANA Titer 1: NEGATIVE
ENA RNP Ab: <0.2 AI (ref 0.0–0.9)
ENA SM Ab Ser-aCnc: <0.2 AI (ref 0.0–0.9)
ENA SSA (RO) Ab: 0.6 AI (ref 0.0–0.9)
ENA SSB (LA) Ab: 2.4 AI — ABNORMAL HIGH (ref 0.0–0.9)
Scleroderma (Scl-70) (ENA) Antibody, IgG: <0.2 AI (ref 0.0–0.9)
dsDNA Ab: <1 IU/mL (ref 0–9)

## 2021-05-12 DIAGNOSIS — K219 Gastro-esophageal reflux disease without esophagitis: Secondary | ICD-10-CM | POA: Diagnosis not present

## 2021-05-12 DIAGNOSIS — R1310 Dysphagia, unspecified: Secondary | ICD-10-CM | POA: Diagnosis not present

## 2021-05-12 DIAGNOSIS — J45909 Unspecified asthma, uncomplicated: Secondary | ICD-10-CM | POA: Diagnosis not present

## 2021-05-12 DIAGNOSIS — Z79899 Other long term (current) drug therapy: Secondary | ICD-10-CM | POA: Diagnosis not present

## 2021-05-12 DIAGNOSIS — M349 Systemic sclerosis, unspecified: Secondary | ICD-10-CM | POA: Diagnosis not present

## 2021-05-19 ENCOUNTER — Other Ambulatory Visit: Payer: Self-pay

## 2021-05-19 ENCOUNTER — Ambulatory Visit: Payer: Medicaid Other | Admitting: Adult Health

## 2021-05-19 ENCOUNTER — Encounter: Payer: Self-pay | Admitting: Adult Health

## 2021-05-19 ENCOUNTER — Other Ambulatory Visit (HOSPITAL_COMMUNITY)
Admission: RE | Admit: 2021-05-19 | Discharge: 2021-05-19 | Disposition: A | Discharge status: Home / Self Care | Payer: Medicaid Other | Source: Ambulatory Visit | Attending: Adult Health | Admitting: Adult Health

## 2021-05-19 VITALS — BP 119/79 | HR 55 | Ht 60.0 in | Wt 225.5 lb

## 2021-05-19 DIAGNOSIS — K5900 Constipation, unspecified: Secondary | ICD-10-CM | POA: Diagnosis not present

## 2021-05-19 DIAGNOSIS — N898 Other specified noninflammatory disorders of vagina: Secondary | ICD-10-CM | POA: Insufficient documentation

## 2021-05-19 NOTE — Progress Notes (Signed)
  Subjective:     Patient ID: Mckenzie Cooley, female   DOB: 11/23/1990, 30 y.o.   MRN: [id number]  HPI Mckenzie Cooley is a 30 year old white female,married, [id number], in complaining of vaginal discharge, no odor, or itching, but is constipated and back hurts some. She was diagnosed with esophageal dysmotility and is on soft,liquid  food since 02/24/21 and has lost about 13 lbs.Has seen Dr Laural Golden and referred to [facility].   Lab Results  Component Value Date   DIAGPAP  02/06/2021    - Negative for intraepithelial lesion or malignancy (NILM)   [place] Negative 02/06/2021   PCP is Marjorie Smolder NP.  Review of Systems +vaginal discharge +constipation Reviewed past medical,surgical, social and family history. Reviewed medications and allergies.     Objective:   Physical Exam BP 119/79 (BP Location: Right Arm, Patient Position: Sitting, Cuff Size: Large)   Pulse (!) 55   Ht 5' (1.524 m)   Wt 225 lb 8 oz (102.3 kg)   LMP 04/06/2021   BMI 44.04 kg/m     Skin warm and dry. Lungs: clear to ausculation bilaterally. Cardiovascular: regular rate and rhythm.  Fall risk is low  Upstream - 05/19/21 1539       Pregnancy Intention Screening   Does the patient want to become pregnant in the next year? No    Does the patient's partner want to become pregnant in the next year? No    Would the patient like to discuss contraceptive options today? No      Contraception Wrap Up   Current Method Female Condom    End Method Female Condom             Assessment:     1. Vaginal discharge CV swab sent for GC/CHL,trich,BV and yeast  2. Constipation, unspecified constipation type Try miralax daily     Plan:     Will talk when CV swab back Follow up prn

## 2021-05-20 ENCOUNTER — Ambulatory Visit (HOSPITAL_COMMUNITY): Payer: Medicaid Other | Attending: Plastic Surgery | Admitting: Physical Therapy

## 2021-05-21 ENCOUNTER — Other Ambulatory Visit: Payer: Self-pay

## 2021-05-21 ENCOUNTER — Encounter: Payer: Self-pay | Admitting: Orthopaedic Surgery

## 2021-05-21 ENCOUNTER — Ambulatory Visit (INDEPENDENT_AMBULATORY_CARE_PROVIDER_SITE_OTHER): Payer: Medicaid Other | Admitting: Orthopaedic Surgery

## 2021-05-21 VITALS — Ht 60.0 in | Wt 225.0 lb

## 2021-05-21 DIAGNOSIS — G5602 Carpal tunnel syndrome, left upper limb: Secondary | ICD-10-CM

## 2021-05-21 LAB — CERVICOVAGINAL ANCILLARY ONLY
Bacterial Vaginitis (gardnerella): NEGATIVE
Candida Glabrata: NEGATIVE
Candida Vaginitis: NEGATIVE
Chlamydia: NEGATIVE
Comment: NEGATIVE
Comment: NEGATIVE
Comment: NEGATIVE
Comment: NEGATIVE
Comment: NEGATIVE
Comment: NORMAL
Neisseria Gonorrhea: NEGATIVE
Trichomonas: NEGATIVE

## 2021-05-21 NOTE — Progress Notes (Signed)
Office Visit Note   Patient: Mckenzie Cooley           Date of Birth: 11/24/1990           MRN: [id number] Visit Date: 05/21/2021              Requested by: Gwenlyn Perking, [place],  [place] [address] PCP: Gwenlyn Perking, FNP   Assessment & Plan: Visit Diagnoses: No diagnosis found.  Plan: Patient is 2 months status post right carpal tunnel release.  She is also status post the same procedure on the left and is done quite well on both sides.  She feels much better than prior to surgery.  Still has just a small amount of pain over her thenar eminence however this does not bother her prevent her from doing anything.  Significant improvement in appearance of the scar much softer less scarring.  At this point she may follow-up as needed  Follow-Up Instructions: No follow-ups on file.   Orders:  No orders of the defined types were placed in this encounter.  No orders of the defined types were placed in this encounter.     Procedures: No procedures performed   Clinical Data: No additional findings.   Subjective: Chief Complaint  Patient presents with   Right Wrist - Follow-up    03/27/2021 Right CTR  Patient returns for follow up status post right carpal tunnel release on 03/27/2021. She states that everything is going well.    Review of Systems  All other systems reviewed and are negative.   Objective: Vital Signs: Ht 5' (1.524 m)   Wt 225 lb (102.1 kg)   BMI 43.94 kg/m   Physical Exam Patient appears well.  Comfortable to exam. Ortho Exam Examination of her right wrist she has no erythema significant decrease in stiffness and scar tissue.  She can oppose all of her fingers without difficulty.  Good flexion and extension of her wrist.  Brisk capillary refill in all of her digits no erythema completely healed surgical incision Specialty Comments:  No specialty comments available.  Imaging: No results found.   PMFS History: Patient Active  Problem List   Diagnosis Date Noted   Vaginal irritation 04/22/2021   Vaginal discharge 04/01/2021   Routine general medical examination at a health care facility 02/06/2021   Patient desires pregnancy 02/06/2021   Bilateral carpal tunnel syndrome 11/06/2020   Spotting 12/14/2018   Positive urine pregnancy test 12/14/2018   Carpal tunnel syndrome, left upper limb 06/01/2018   Migraine 12/09/2017   Iron deficiency 07/20/2017   Viral upper respiratory tract infection 03/24/2017   Allergic otitis media of both ears 03/24/2017   History of severe pre-eclampsia 02/23/2017   History of anemia 02/23/2017   Previous cesarean section 02/09/2017   H/O severe preeclampsia 02/09/2017   Postoperative anemia due to acute blood loss 01/06/2017   Constipation 10/27/2014   Smoker 09/27/2014   Asthma 07/01/2011   Gastroesophageal reflux disease 07/01/2011   Past Medical History:  Diagnosis Date   Acid reflux    Anemia    Bloating    Childhood asthma    Esophageal dysmotility    History of blood transfusion    PCOS (polycystic ovarian syndrome) 09/27/2014   Preeclampsia    Seasonal allergies     Family History  Problem Relation Age of Onset   Kidney disease Maternal Grandmother    Renal Disease Maternal Grandmother    Heart disease Father  Hypertension Mother    Cancer - Cervical Maternal Aunt     Past Surgical History:  Procedure Laterality Date   BIOPSY  02/26/2021   Procedure: BIOPSY;  Surgeon: Rogene Houston, MD;  [facility];  Service: Endoscopy;;  Esophageal biopsy   carpal tunnel Left 01/30/2021   CARPAL TUNNEL RELEASE Right 03/27/2021   CESAREAN SECTION N/A 01/02/2017   Procedure: CESAREAN SECTION;  Surgeon: Chancy Milroy, MD;  Location: [place];  Service: Obstetrics;  Laterality: N/A;   CHOLECYSTECTOMY  2011   Dr.Byerly   ESOPHAGEAL DILATION N/A 02/26/2021   Procedure: ESOPHAGEAL DILATION;  Surgeon: Rogene Houston, MD;  [facility];  Service: Endoscopy;  Laterality: N/A;   ESOPHAGOGASTRODUODENOSCOPY N/A 10/26/2014   Procedure: ESOPHAGOGASTRODUODENOSCOPY (EGD);  Surgeon: Rogene Houston, MD;  [facility];  Service: Endoscopy;  Laterality: N/A;   ESOPHAGOGASTRODUODENOSCOPY (EGD) WITH PROPOFOL N/A 02/26/2021   Procedure: ESOPHAGOGASTRODUODENOSCOPY (EGD) WITH PROPOFOL;  Surgeon: Rogene Houston, MD;  [facility];  Service: Endoscopy;  Laterality: N/A;   POLYPECTOMY  02/26/2021   Procedure: POLYPECTOMY;  Surgeon: Rogene Houston, MD;  [facility];  Service: Endoscopy;;  Gastric Body Polyps x 3    UPPER GASTROINTESTINAL ENDOSCOPY  12/05/2009   UPPER GASTROINTESTINAL ENDOSCOPY  02/07/2009   WISDOM TOOTH EXTRACTION     WISDOM TOOTH EXTRACTION     Social History   Occupational History   Not on file  Tobacco Use   Smoking status: Former    Packs/day: 0.50    Years: 12.00    Pack years: 6.00    Types: Cigarettes    Quit date: 05/05/2016    Years since quitting: 5.0   Smokeless tobacco: Never  Vaping Use   Vaping Use: Never used  Substance and Sexual Activity   Alcohol use: No    Alcohol/week: 0.0 standard drinks   Drug use: No   Sexual activity: Yes    Birth control/protection: Condom

## 2021-05-26 DIAGNOSIS — R1319 Other dysphagia: Secondary | ICD-10-CM | POA: Diagnosis not present

## 2021-05-26 DIAGNOSIS — R6339 Other feeding difficulties: Secondary | ICD-10-CM | POA: Diagnosis not present

## 2021-05-26 DIAGNOSIS — R1310 Dysphagia, unspecified: Secondary | ICD-10-CM | POA: Diagnosis not present

## 2021-05-27 ENCOUNTER — Encounter (INDEPENDENT_AMBULATORY_CARE_PROVIDER_SITE_OTHER): Payer: Self-pay | Admitting: Internal Medicine

## 2021-06-02 ENCOUNTER — Encounter (HOSPITAL_COMMUNITY): Payer: Self-pay | Admitting: Physical Therapy

## 2021-06-02 ENCOUNTER — Other Ambulatory Visit: Payer: Self-pay

## 2021-06-02 ENCOUNTER — Ambulatory Visit (HOSPITAL_COMMUNITY): Payer: Medicaid Other | Attending: Plastic Surgery | Admitting: Physical Therapy

## 2021-06-02 DIAGNOSIS — M546 Pain in thoracic spine: Secondary | ICD-10-CM | POA: Insufficient documentation

## 2021-06-02 DIAGNOSIS — M542 Cervicalgia: Secondary | ICD-10-CM | POA: Diagnosis not present

## 2021-06-02 DIAGNOSIS — R293 Abnormal posture: Secondary | ICD-10-CM | POA: Diagnosis not present

## 2021-06-02 NOTE — Therapy (Signed)
[facility] [place], [place], [address] Phone: [phone]   Fax:  [phone]  Physical Therapy Evaluation  Patient Details  Name: Mckenzie Cooley MRN: [id number] Date of Birth: [birth year] Referring Provider (PT): Mingo Amber MD   Encounter Date: 06/02/2021   PT End of Session - 06/02/21 1333     Visit Number 1    Number of Visits 8    Date for PT Re-Evaluation 06/30/21    Authorization Type Medicaid [employer]    Authorization Time Period Check auth    PT Start Time 1305    PT Stop Time 1343    PT Time Calculation (min) 38 min    Activity Tolerance Patient tolerated treatment well    Behavior During Therapy [facility] for tasks assessed/performed             Past Medical History:  Diagnosis Date   Acid reflux    Anemia    Bloating    Childhood asthma    Esophageal dysmotility    History of blood transfusion    PCOS (polycystic ovarian syndrome) 09/27/2014   Preeclampsia    Seasonal allergies     Past Surgical History:  Procedure Laterality Date   BIOPSY  02/26/2021   Procedure: BIOPSY;  Surgeon: Rogene Houston, MD;  [facility];  Service: Endoscopy;;  Esophageal biopsy   carpal tunnel Left 01/30/2021   CARPAL TUNNEL RELEASE Right 03/27/2021   CESAREAN SECTION N/A 01/02/2017   Procedure: CESAREAN SECTION;  Surgeon: Chancy Milroy, MD;  Location: [facility];  Service: Obstetrics;  Laterality: N/A;   CHOLECYSTECTOMY  2011   Dr.Byerly   ESOPHAGEAL DILATION N/A 02/26/2021   Procedure: ESOPHAGEAL DILATION;  Surgeon: Rogene Houston, MD;  [facility];  Service: Endoscopy;  Laterality: N/A;   ESOPHAGOGASTRODUODENOSCOPY N/A 10/26/2014   Procedure: ESOPHAGOGASTRODUODENOSCOPY (EGD);  Surgeon: Rogene Houston, MD;  [facility];  Service: Endoscopy;  Laterality: N/A;   ESOPHAGOGASTRODUODENOSCOPY (EGD) WITH PROPOFOL N/A 02/26/2021   Procedure:  ESOPHAGOGASTRODUODENOSCOPY (EGD) WITH PROPOFOL;  Surgeon: Rogene Houston, MD;  [facility];  Service: Endoscopy;  Laterality: N/A;   POLYPECTOMY  02/26/2021   Procedure: POLYPECTOMY;  Surgeon: Rogene Houston, MD;  [facility];  Service: Endoscopy;;  Gastric Body Polyps x 3    UPPER GASTROINTESTINAL ENDOSCOPY  12/05/2009   UPPER GASTROINTESTINAL ENDOSCOPY  02/07/2009   WISDOM TOOTH EXTRACTION     WISDOM TOOTH EXTRACTION      There were no vitals filed for this visit.    Subjective Assessment - 06/02/21 1317     Subjective Patient presents to therapy with complaint of upper back and neck pain. She is undergoing consultation for breast reduction surgery and is having to try conservative management first for insurance reasons. Patient reports long standing history of upper back pain and postural problems. She has not had prior therapy for this. No pain medication for this currently.    Limitations Sitting;House hold activities;Lifting    Patient Stated Goals Try to avoid surgery if possible, make my back feel better.    Currently in Pain? Yes    Pain Score 3     Pain Location Neck    Pain Orientation Posterior;Upper    Pain Descriptors / Indicators Constant;Aching;Dull    Pain Type Chronic pain    Pain Onset More than a month ago    Pain Frequency Constant    Aggravating  Factors  pronlonged sitting, lifting,    Pain Relieving Factors cracking back, laying down    Effect of Pain on Daily Activities Limits                [facility] PT Assessment - 06/02/21 0001       Assessment   Medical Diagnosis upper back, neck pain    Referring Provider (PT) Mingo Amber MD    Next MD Visit Nothing scheduled    Prior Therapy No      Precautions   Precautions None      Restrictions   Weight Bearing Restrictions No      Prior Function   Level of Independence Independent      Cognition   Overall Cognitive Status Within Functional Limits for tasks assessed       Posture/Postural Control   Posture/Postural Control Postural limitations    Postural Limitations Rounded Shoulders;Forward head      ROM / Strength   AROM / PROM / Strength AROM;Strength      AROM   Overall AROM Comments cervical AROM and bilateral shoulder AROM grossly WNL    AROM Assessment Site Cervical;Shoulder      Strength   Overall Strength Comments bilateral lower trap 4-/5    Strength Assessment Site Shoulder    Right/Left Shoulder Right;Left    Right Shoulder Flexion 5/5    Right Shoulder ABduction 5/5    Right Shoulder External Rotation 4+/5    Left Shoulder Flexion 5/5    Left Shoulder ABduction 5/5    Left Shoulder External Rotation 4+/5      Palpation   Spinal mobility hypomible throughout t spine notably T6-9    Palpation comment Mod tenderness to palpation about bilateral upper trap, levator and periscapular muscles                        Objective measurements completed on examination: See above findings.       [facility] Adult PT Treatment/Exercise - 06/02/21 0001       Exercises   Exercises Shoulder      Shoulder Exercises: Seated   Other Seated Exercises scap retraction, chin tuck x10 each      Shoulder Exercises: Stretch   Corner Stretch 2 reps;30 seconds                     PT Education - 06/02/21 1320     Education Details on evalution findings, POC and HEP    Person(s) Educated Patient    Methods Explanation;Handout    Comprehension Verbalized understanding              PT Short Term Goals - 06/02/21 1339       PT SHORT TERM GOAL #1   Title Patient will be independent with initial HEP and self-management strategies to improve functional outcomes    Time 2    Period Weeks    Status New    Target Date 06/16/21               PT Long Term Goals - 06/02/21 1339       PT LONG TERM GOAL #1   Title Patient will be independent with advanced HEP and self-management strategies to improve functional  outcomes    Time 4    Period Weeks    Status New    Target Date 06/30/21      PT LONG TERM GOAL #2  Title Patient will report at least 70% overall improvement in subjective complaint to indicate improvement in ability to perform ADLs.    Time 4    Period Weeks    Status New    Target Date 06/30/21      PT LONG TERM GOAL #3   Title Patient will improve lower trap MMT to 4+/5 for improved posturing and pain free UE function with ADLs.    Time 4    Period Weeks    Status New    Target Date 06/30/21                    Plan - 06/02/21 1336     Clinical Impression Statement Patient is a 30 y.o. female who presents to physical therapy with complaint of neck and upper back pain. Patient demonstrates decreased strength, reduced flexibility, increased tenderness to palpation and postural abnormalities which are likely contributing to symptoms of pain and are negatively impacting patient ability to perform ADLs. Patient will benefit from skilled physical therapy services to address these deficits to reduce pain and improve level of function with ADLs    Examination-Activity Limitations Sit;Lift;Reach Overhead;Carry;Stand    Examination-Participation Restrictions Cleaning;Shop;Community Activity;Laundry;Yard Work    Stability/Clinical Decision Making Stable/Uncomplicated    Designer, jewellery Low    Rehab Potential Good    PT Frequency 2x / week    PT Duration 4 weeks    PT Treatment/Interventions ADLs/Self Care Home Management;Biofeedback;Cryotherapy;Electrical Stimulation;DME Instruction;Fluidtherapy;Parrafin;Ultrasound;Neuromuscular re-education;Compression bandaging;Passive range of motion;Patient/family education;Dry needling;Orthotic Fit/Training;Energy conservation;Splinting;Taping;Vasopneumatic Device;Manual techniques;Therapeutic exercise;Therapeutic activities;Manual lymph drainage;Traction;Moist Heat;Iontophoresis 4mg /ml Dexamethasone;Visual/perceptual  remediation/compensation;Spinal Manipulations;Joint Manipulations;Scar mobilization    PT Next Visit Plan Review HEP and progress postural strength program as tolerated. Band progressions, prone isometrics. Manual STM as indicated for pain and restricitons.    PT Home Exercise Plan Eval: chin tuck, scapular retraction, pec stretch    Consulted and Agree with Plan of Care Patient             Patient will benefit from skilled therapeutic intervention in order to improve the following deficits and impairments:  Hypomobility, Decreased activity tolerance, Decreased strength, Increased fascial restricitons, Impaired UE functional use, Pain, Decreased mobility, Impaired flexibility, Postural dysfunction, Improper body mechanics  Visit Diagnosis: Cervicalgia  Pain in thoracic spine  Abnormal posture     Problem List Patient Active Problem List   Diagnosis Date Noted   Vaginal irritation 04/22/2021   Vaginal discharge 04/01/2021   Routine general medical examination at a health care facility 02/06/2021   Patient desires pregnancy 02/06/2021   Bilateral carpal tunnel syndrome 11/06/2020   Spotting 12/14/2018   Positive urine pregnancy test 12/14/2018   Carpal tunnel syndrome, left upper limb 06/01/2018   Migraine 12/09/2017   Iron deficiency 07/20/2017   Viral upper respiratory tract infection 03/24/2017   Allergic otitis media of both ears 03/24/2017   History of severe pre-eclampsia 02/23/2017   History of anemia 02/23/2017   Previous cesarean section 02/09/2017   H/O severe preeclampsia 02/09/2017   Postoperative anemia due to acute blood loss 01/06/2017   Constipation 10/27/2014   Smoker 09/27/2014   Asthma 07/01/2011   Gastroesophageal reflux disease 07/01/2011   1:45 PM, 06/02/21 Josue Hector PT DPT  Physical Therapist with [facility]  ([phone] [place],  [place], [address] Phone: [phone]   Fax:  [phone]  Name: OTELIA HETTINGER MRN: [id number] Date of  Birth: 05-Mar-1991

## 2021-06-02 NOTE — Patient Instructions (Signed)
Access Code: [email or web address] URL: [email or web address] Date: 06/02/2021 Prepared by: Josue Hector  Exercises Seated Scapular Retraction - 3 x daily - 7 x weekly - 2 sets - 10 reps - 5 second hold Seated Cervical Retraction - 3 x daily - 7 x weekly - 2 sets - 10 reps - 5 second hold Doorway Pec Stretch at 60 Degrees Abduction with Arm Straight - 3 x daily - 7 x weekly - 1 sets - 3 reps - 30 second hold

## 2021-06-03 ENCOUNTER — Ambulatory Visit (HOSPITAL_COMMUNITY): Payer: Medicaid Other | Admitting: Physical Therapy

## 2021-06-03 ENCOUNTER — Encounter (HOSPITAL_COMMUNITY): Payer: Self-pay | Admitting: Physical Therapy

## 2021-06-03 DIAGNOSIS — M542 Cervicalgia: Secondary | ICD-10-CM | POA: Diagnosis not present

## 2021-06-03 DIAGNOSIS — M546 Pain in thoracic spine: Secondary | ICD-10-CM | POA: Diagnosis not present

## 2021-06-03 DIAGNOSIS — R293 Abnormal posture: Secondary | ICD-10-CM | POA: Diagnosis not present

## 2021-06-03 NOTE — Therapy (Signed)
[facility] [place], [place], [address] Phone: [phone]   Fax:  [phone]  Physical Therapy Treatment  Patient Details  Name: Mckenzie Cooley MRN: [id number] Date of Birth: December 09, 1990 Referring Provider (PT): Mingo Amber MD   Encounter Date: 06/03/2021   PT End of Session - 06/03/21 1110     Visit Number 2    Number of Visits 8    Date for PT Re-Evaluation 06/30/21    Authorization Type Medicaid [employer]    Authorization Time Period Check auth    PT Start Time 1110    PT Stop Time 1155    PT Time Calculation (min) 45 min    Activity Tolerance Patient tolerated treatment well    Behavior During Therapy [facility] for tasks assessed/performed             Past Medical History:  Diagnosis Date   Acid reflux    Anemia    Bloating    Childhood asthma    Esophageal dysmotility    History of blood transfusion    PCOS (polycystic ovarian syndrome) 09/27/2014   Preeclampsia    Seasonal allergies     Past Surgical History:  Procedure Laterality Date   BIOPSY  02/26/2021   Procedure: BIOPSY;  Surgeon: Rogene Houston, MD;  [facility];  Service: Endoscopy;;  Esophageal biopsy   carpal tunnel Left 01/30/2021   CARPAL TUNNEL RELEASE Right 03/27/2021   CESAREAN SECTION N/A 01/02/2017   Procedure: CESAREAN SECTION;  Surgeon: Chancy Milroy, MD;  Location: [facility];  Service: Obstetrics;  Laterality: N/A;   CHOLECYSTECTOMY  2011   Dr.Byerly   ESOPHAGEAL DILATION N/A 02/26/2021   Procedure: ESOPHAGEAL DILATION;  Surgeon: Rogene Houston, MD;  [facility];  Service: Endoscopy;  Laterality: N/A;   ESOPHAGOGASTRODUODENOSCOPY N/A 10/26/2014   Procedure: ESOPHAGOGASTRODUODENOSCOPY (EGD);  Surgeon: Rogene Houston, MD;  [facility];  Service: Endoscopy;  Laterality: N/A;   ESOPHAGOGASTRODUODENOSCOPY (EGD) WITH PROPOFOL N/A 02/26/2021   Procedure: ESOPHAGOGASTRODUODENOSCOPY  (EGD) WITH PROPOFOL;  Surgeon: Rogene Houston, MD;  [facility];  Service: Endoscopy;  Laterality: N/A;   POLYPECTOMY  02/26/2021   Procedure: POLYPECTOMY;  Surgeon: Rogene Houston, MD;  [facility];  Service: Endoscopy;;  Gastric Body Polyps x 3    UPPER GASTROINTESTINAL ENDOSCOPY  12/05/2009   UPPER GASTROINTESTINAL ENDOSCOPY  02/07/2009   WISDOM TOOTH EXTRACTION     WISDOM TOOTH EXTRACTION      There were no vitals filed for this visit.   Subjective Assessment - 06/03/21 1113     Subjective Patient reports mild soreness from exercises yesterday. Doing well otherwise.    Limitations Sitting;House hold activities;Lifting    Patient Stated Goals Try to avoid surgery if possible, make my back feel better.    Currently in Pain? Yes    Pain Score 4     Pain Location Neck    Pain Orientation Posterior;Upper    Pain Descriptors / Indicators Aching;Constant;Dull    Pain Onset More than a month ago                               [facility] Adult PT Treatment/Exercise - 06/03/21 0001       Shoulder Exercises: Seated   Other Seated Exercises scap retraction, chin tuck x10 each      Shoulder Exercises: Standing  Horizontal ABduction 20 reps;Theraband    Theraband Level (Shoulder Horizontal ABduction) Level 3 (Green)    External Rotation 20 reps;Theraband    Theraband Level (Shoulder External Rotation) Level 3 (Green)    Extension 20 reps;Theraband    Theraband Level (Shoulder Extension) Level 3 (Green)    Row 20 reps;Theraband    Theraband Level (Shoulder Row) Level 3 (Green)      Shoulder Exercises: Stretch   Other Shoulder Stretches upper trap stretch 2 x 30"      Manual Therapy   Manual Therapy Soft tissue mobilization    Manual therapy comments Completed separate from all other activity    Soft tissue mobilization IASTM using theragun (lv9) to bilateral rhomboids, upper trap and levator                       PT Short  Term Goals - 06/02/21 1339       PT SHORT TERM GOAL #1   Title Patient will be independent with initial HEP and self-management strategies to improve functional outcomes    Time 2    Period Weeks    Status New    Target Date 06/16/21               PT Long Term Goals - 06/02/21 1339       PT LONG TERM GOAL #1   Title Patient will be independent with advanced HEP and self-management strategies to improve functional outcomes    Time 4    Period Weeks    Status New    Target Date 06/30/21      PT LONG TERM GOAL #2   Title Patient will report at least 70% overall improvement in subjective complaint to indicate improvement in ability to perform ADLs.    Time 4    Period Weeks    Status New    Target Date 06/30/21      PT LONG TERM GOAL #3   Title Patient will improve lower trap MMT to 4+/5 for improved posturing and pain free UE function with ADLs.    Time 4    Period Weeks    Status New    Target Date 06/30/21                   Plan - 06/03/21 1158     Clinical Impression Statement Reviewed therapy goals and HEP. Progressed postural strengthening program with resistance bands. Discussed core activation and abdominal bracing for lumbar stability and reducing back pain. Patient cued on proper form and function of added exercises. Tolerated well, noting improved symptoms end of session. Issued updated HEP handout. Patient will continue to benefit from skilled therapy services to reduce deficits and improve function.    Examination-Activity Limitations Sit;Lift;Reach Overhead;Carry;Stand    Examination-Participation Restrictions Cleaning;Shop;Community Activity;Laundry;Yard Work    Stability/Clinical Decision Making Stable/Uncomplicated    Rehab Potential Good    PT Frequency 2x / week    PT Duration 4 weeks    PT Treatment/Interventions ADLs/Self Care Home Management;Biofeedback;Cryotherapy;Electrical Stimulation;DME  Instruction;Fluidtherapy;Parrafin;Ultrasound;Neuromuscular re-education;Compression bandaging;Passive range of motion;Patient/family education;Dry needling;Orthotic Fit/Training;Energy conservation;Splinting;Taping;Vasopneumatic Device;Manual techniques;Therapeutic exercise;Therapeutic activities;Manual lymph drainage;Traction;Moist Heat;Iontophoresis 4mg /ml Dexamethasone;Visual/perceptual remediation/compensation;Spinal Manipulations;Joint Manipulations;Scar mobilization    PT Next Visit Plan progress postural strength program as tolerated. Band progressions, prone isometrics. Manual STM as indicated for pain and restricitons.    PT Home Exercise Plan Eval: chin tuck, scapular retraction, pec stretch 12/14 band rows, extension, shoulder ER, shoulder horiz abduction  Consulted and Agree with Plan of Care Patient             Patient will benefit from skilled therapeutic intervention in order to improve the following deficits and impairments:  Hypomobility, Decreased activity tolerance, Decreased strength, Increased fascial restricitons, Impaired UE functional use, Pain, Decreased mobility, Impaired flexibility, Postural dysfunction, Improper body mechanics  Visit Diagnosis: Cervicalgia  Pain in thoracic spine  Abnormal posture     Problem List Patient Active Problem List   Diagnosis Date Noted   Vaginal irritation 04/22/2021   Vaginal discharge 04/01/2021   Routine general medical examination at a health care facility 02/06/2021   Patient desires pregnancy 02/06/2021   Bilateral carpal tunnel syndrome 11/06/2020   Spotting 12/14/2018   Positive urine pregnancy test 12/14/2018   Carpal tunnel syndrome, left upper limb 06/01/2018   Migraine 12/09/2017   Iron deficiency 07/20/2017   Viral upper respiratory tract infection 03/24/2017   Allergic otitis media of both ears 03/24/2017   History of severe pre-eclampsia 02/23/2017   History of anemia 02/23/2017   Previous cesarean  section 02/09/2017   H/O severe preeclampsia 02/09/2017   Postoperative anemia due to acute blood loss 01/06/2017   Constipation 10/27/2014   Smoker 09/27/2014   Asthma 07/01/2011   Gastroesophageal reflux disease 07/01/2011   12:01 PM, 06/03/21 Josue Hector PT DPT  Physical Therapist with [facility]  [phone] [place], [place], [address] Phone: [phone]   Fax:  [phone]  Name: Mckenzie Cooley MRN: [id number] Date of Birth: 17-Jan-1991

## 2021-06-04 ENCOUNTER — Other Ambulatory Visit: Payer: Self-pay

## 2021-06-04 ENCOUNTER — Encounter: Payer: Medicaid Other | Admitting: Orthopaedic Surgery

## 2021-06-05 ENCOUNTER — Ambulatory Visit: Payer: Medicaid Other | Admitting: Family Medicine

## 2021-06-05 ENCOUNTER — Telehealth: Payer: Self-pay | Admitting: Family Medicine

## 2021-06-05 ENCOUNTER — Encounter: Payer: Self-pay | Admitting: Family Medicine

## 2021-06-05 DIAGNOSIS — R6889 Other general symptoms and signs: Secondary | ICD-10-CM

## 2021-06-05 MED ORDER — PROMETHAZINE-DM 6.25-15 MG/5ML PO SYRP: 5.0000 mL | ORAL_SOLUTION | Freq: Four times a day (QID) | ORAL | 0 refills | Status: DC | PRN

## 2021-06-05 MED ORDER — PSEUDOEPH-BROMPHEN-DM 30-2-10 MG/5ML PO SYRP: 10.0000 mL | ORAL_SOLUTION | Freq: Three times a day (TID) | ORAL | 0 refills | Status: DC | PRN

## 2021-06-05 NOTE — Telephone Encounter (Signed)
Mckenzie Cooley called from [facility] to let PCP know that this medication is on back order. brompheniramine-pseudoephedrine-DM 30-2-10 MG/5ML syrup

## 2021-06-05 NOTE — Telephone Encounter (Signed)
Left message advising new rx sent in and to call back with any further questions.

## 2021-06-05 NOTE — Telephone Encounter (Signed)
Pt unable to get the medicine (brompheniramine-pseudoephedrine-DM 30-2-10 MG/5ML syrup) that was called in because the pharmacy is out. Can something else be called in? Please call back and advise.

## 2021-06-05 NOTE — Progress Notes (Signed)
° °  Virtual Visit  Note Due to COVID-19 pandemic this visit was conducted virtually. This visit type was conducted due to national recommendations for restrictions regarding the COVID-19 Pandemic (e.g. social distancing, sheltering in place) in an effort to limit this patient's exposure and mitigate transmission in our community. All issues noted in this document were discussed and addressed.  A physical exam was not performed with this format.  I connected with Mckenzie Cooley on 06/05/21 at 1101 by telephone and verified that I am speaking with the correct person using two identifiers. Mckenzie Cooley is currently located at home and no one is currently with him during the visit. The provider, Gwenlyn Perking, FNP is located in their office at time of visit.  I discussed the limitations, risks, security and privacy concerns of performing an evaluation and management service by telephone and the availability of in person appointments. I also discussed with the patient that there may be a patient responsible charge related to this service. The patient expressed understanding and agreed to proceed.  CC: cough  History and Present Illness:  HPI [name] reports cough, congest, sneezing, chills, and body aches x 1 day. She did have a fever last night of 101.1. She has been taking mucinex and tylenol for her symptoms. She denies shortness of breath, wheezing, chest pain, vomiting, or diarrhea.     ROS As per HPI.    Observations/Objective: Alert and oriented x 3. Able to speak in full sentences without difficulty.   Assessment and Plan: [name] was seen today for cough.  Diagnoses and all orders for this visit:  Flu-like symptoms She will come by the office for testing as below. Quarantine until [employer]. Discussed symptomatic care and return precautions. Will notify patient of test results when available. She would like tamiflu liquid sent in if she is positive for the flu.  -     COVID-19,  Flu A+B and RSV -     brompheniramine-pseudoephedrine-DM 30-2-10 MG/5ML syrup; Take 10 mLs by mouth 3 (three) times daily as needed.    Follow Up Instructions: As needed.     I discussed the assessment and treatment plan with the patient. The patient was provided an opportunity to ask questions and all were answered. The patient agreed with the plan and demonstrated an understanding of the instructions.   The patient was advised to call back or seek an in-person evaluation if the symptoms worsen or if the condition fails to improve as anticipated.  The above assessment and management plan was discussed with the patient. The patient verbalized understanding of and has agreed to the management plan. Patient is aware to call the clinic if symptoms persist or worsen. Patient is aware when to return to the clinic for a follow-up visit. Patient educated on when it is appropriate to go to the emergency department.   Time call ended:  1112  I provided 11 minutes of  non face-to-face time during this encounter.    Gwenlyn Perking, FNP

## 2021-06-05 NOTE — Telephone Encounter (Signed)
Promethazine DM sent in instead.

## 2021-06-06 LAB — COVID-19, FLU A+B AND RSV
Influenza A, NAA: NOT DETECTED
Influenza B, NAA: NOT DETECTED
RSV, NAA: NOT DETECTED
SARS-CoV-2, NAA: NOT DETECTED

## 2021-06-08 ENCOUNTER — Encounter: Payer: Self-pay | Admitting: Family Medicine

## 2021-06-10 ENCOUNTER — Ambulatory Visit (HOSPITAL_COMMUNITY): Payer: Medicaid Other | Admitting: Physical Therapy

## 2021-06-13 ENCOUNTER — Ambulatory Visit (HOSPITAL_COMMUNITY): Payer: Medicaid Other

## 2021-06-17 ENCOUNTER — Encounter: Payer: Self-pay | Admitting: Family Medicine

## 2021-06-17 ENCOUNTER — Ambulatory Visit: Payer: Medicaid Other | Admitting: Family Medicine

## 2021-06-17 VITALS — BP 148/92 | HR 59 | Temp 98.5°F | Ht 60.0 in | Wt 225.0 lb

## 2021-06-17 DIAGNOSIS — F321 Major depressive disorder, single episode, moderate: Secondary | ICD-10-CM | POA: Diagnosis not present

## 2021-06-17 DIAGNOSIS — F411 Generalized anxiety disorder: Secondary | ICD-10-CM | POA: Diagnosis not present

## 2021-06-17 DIAGNOSIS — R030 Elevated blood-pressure reading, without diagnosis of hypertension: Secondary | ICD-10-CM | POA: Diagnosis not present

## 2021-06-17 MED ORDER — FLUOXETINE HCL 20 MG PO CAPS: 20.0000 mg | ORAL_CAPSULE | Freq: Every day | ORAL | 3 refills | Status: DC

## 2021-06-17 NOTE — Patient Instructions (Signed)

## 2021-06-17 NOTE — Progress Notes (Signed)
Subjective:  Patient ID: Mckenzie Cooley, female    DOB: [birth year], 29 y.o.   MRN: [id number]  Patient Care Team: Gwenlyn Perking, FNP as PCP - General (Family Medicine)   Chief Complaint:  Follow-up (Anxiety, medication management)   HPI: Mckenzie Cooley is a 30 y.o. female presenting on 06/17/2021 for Follow-up (Anxiety, medication management)   Patient presents today for the evaluation of worsening anxiety and depressive symptoms.  Patient states that she has been diagnosed with depression and anxiety, questionable bipolar disorder and ADHD in the past.  She has never been on SSRI or SNRI therapy.  States she was on some medications for ADHD when she was a child.  She has seen a counselor in the past but does not wish to return.  States that she has noticed over the last several weeks she has had increased anxiety, anger, and agitation.  She denies any SI or HI.  She has been talking with her husband who helps to calm her down during these episodes.  States at times she will have very vivid dreams which causes her to wake up in a panic.  GAD 7 : Generalized Anxiety Score 06/17/2021 02/06/2021 02/05/2021 12/25/2020  Nervous, Anxious, on Edge 3 0 0 0  Control/stop worrying 3 0 0 0  Worry too much - different things 3 0 0 3  Trouble relaxing 3 0 0 0  Restless 3 0 0 0  Easily annoyed or irritable 3 0 0 3  Afraid - awful might happen 3 0 0 0  Total GAD 7 Score 21 0 0 6  Anxiety Difficulty - - Not difficult at all Somewhat difficult    Depression screen [facility] 2/9 06/17/2021 04/01/2021 02/06/2021 02/05/2021 01/16/2021  Decreased Interest 1 0 0 0 0  Down, Depressed, Hopeless 0 0 0 0 0  PHQ - 2 Score 1 0 0 0 0  Altered sleeping 3 - 0 0 0  Tired, decreased energy 3 - 0 0 0  Change in appetite 0 - 0 0 0  Feeling bad or failure about yourself  0 - 0 0 0  Trouble concentrating 1 - 0 0 0  Moving slowly or fidgety/restless 0 - 0 0 0  Suicidal thoughts 0 - 0 0 0  PHQ-9 Score 8 - 0 0 0   Difficult doing work/chores - - - Not difficult at all Not difficult at all  Some recent data might be hidden      Relevant past medical, surgical, family, and social history reviewed and updated as indicated.  Allergies and medications reviewed and updated. Data reviewed: Chart in Epic.   Past Medical History:  Diagnosis Date   Acid reflux    Anemia    Bloating    Childhood asthma    Esophageal dysmotility    History of blood transfusion    PCOS (polycystic ovarian syndrome) 09/27/2014   Preeclampsia    Seasonal allergies     Past Surgical History:  Procedure Laterality Date   BIOPSY  02/26/2021   Procedure: BIOPSY;  Surgeon: Rogene Houston, MD;  [facility];  Service: Endoscopy;;  Esophageal biopsy   carpal tunnel Left 01/30/2021   CARPAL TUNNEL RELEASE Right 03/27/2021   CESAREAN SECTION N/A 01/02/2017   Procedure: CESAREAN SECTION;  Surgeon: Chancy Milroy, MD;  Location: [place];  Service: Obstetrics;  Laterality: N/A;   CHOLECYSTECTOMY  2011   Dr.Byerly   ESOPHAGEAL DILATION N/A 02/26/2021  Procedure: ESOPHAGEAL DILATION;  Surgeon: Rogene Houston, MD;  [facility];  Service: Endoscopy;  Laterality: N/A;   ESOPHAGOGASTRODUODENOSCOPY N/A 10/26/2014   Procedure: ESOPHAGOGASTRODUODENOSCOPY (EGD);  Surgeon: Rogene Houston, MD;  [facility];  Service: Endoscopy;  Laterality: N/A;   ESOPHAGOGASTRODUODENOSCOPY (EGD) WITH PROPOFOL N/A 02/26/2021   Procedure: ESOPHAGOGASTRODUODENOSCOPY (EGD) WITH PROPOFOL;  Surgeon: Rogene Houston, MD;  [facility];  Service: Endoscopy;  Laterality: N/A;   POLYPECTOMY  02/26/2021   Procedure: POLYPECTOMY;  Surgeon: Rogene Houston, MD;  [facility];  Service: Endoscopy;;  Gastric Body Polyps x 3    UPPER GASTROINTESTINAL ENDOSCOPY  12/05/2009   UPPER GASTROINTESTINAL ENDOSCOPY  02/07/2009   WISDOM TOOTH EXTRACTION     WISDOM TOOTH EXTRACTION      Social  History   Socioeconomic History   Marital status: Married    Spouse name: Not on file   Number of children: 1   Years of education: Not on file   Highest education level: Not on file  Occupational History   Not on file  Tobacco Use   Smoking status: Former    Packs/day: 0.50    Years: 12.00    Pack years: 6.00    Types: Cigarettes    Quit date: 05/05/2016    Years since quitting: 5.1   Smokeless tobacco: Never  Vaping Use   Vaping Use: Never used  Substance and Sexual Activity   Alcohol use: No    Alcohol/week: 0.0 standard drinks   Drug use: No   Sexual activity: Yes    Birth control/protection: Condom  Other Topics Concern   Not on file  Social History Narrative   Not on file   Social Determinants of Health   Financial Resource Strain: Low Risk    Difficulty of Paying Living Expenses: Not hard at all  Food Insecurity: No Food Insecurity   Worried About Charity fundraiser in the Last Year: Never true   [place] in the Last Year: Never true  Transportation Needs: No Transportation Needs   Lack of Transportation (Medical): No   Lack of Transportation (Non-Medical): No  Physical Activity: Inactive   Days of Exercise per Week: 0 days   Minutes of Exercise per Session: 0 min  Stress: No Stress Concern Present   Feeling of Stress : Not at all  Social Connections: Moderately Integrated   Frequency of Communication with Friends and Family: More than three times a week   Frequency of Social Gatherings with Friends and Family: Once a week   Attends Religious Services: 1 to 4 times per year   Active Member of [employer] or Organizations: No   Attends Archivist Meetings: Never   Marital Status: Married  Human resources officer Violence: Not At Risk   Fear of Current or Ex-Partner: No   Emotionally Abused: No   Physically Abused: No   Sexually Abused: No    Outpatient Encounter Medications as of 06/17/2021  Medication Sig   acetaminophen (TYLENOL) 500 MG  tablet Take 500 mg by mouth every 6 (six) hours as needed.   cetirizine HCl (ZYRTEC) 5 MG/5ML SOLN Take 10 mLs (10 mg total) by mouth daily.   FLUoxetine (PROZAC) 20 MG capsule Take 1 capsule (20 mg total) by mouth daily.   promethazine-dextromethorphan (PROMETHAZINE-DM) 6.25-15 MG/5ML syrup Take 5 mLs by mouth 4 (four) times daily as needed for cough.   [DISCONTINUED] famotidine (PEPCID) 20 MG tablet Take  by mouth.   [DISCONTINUED] sucralfate (CARAFATE) 1 GM/10ML suspension SMARTSIG:2 Teaspoon By Mouth 4 Times Daily   No facility-administered encounter medications on file as of 06/17/2021.    Allergies  Allergen Reactions   Percocet [Oxycodone-Acetaminophen] Hives and Itching   Prilosec [Omeprazole] Other (See Comments)    Stomach cramp   Amoxicillin Rash    Has patient had a PCN reaction causing immediate rash, facial/tongue/throat swelling, SOB or lightheadedness with hypotension: Yes -mild rash Has patient had a PCN reaction causing severe rash involving mucus membranes or skin necrosis: No Has patient had a PCN reaction that required hospitalization: No Has patient had a PCN reaction occurring within the last 10 years: Yes If all of the above answers are "NO", then may proceed with Cephalosporin use. Has tolerated ceftriaxone & cephalexin     Review of Systems  Constitutional:  Positive for activity change, appetite change and fatigue. Negative for chills, diaphoresis, fever and unexpected weight change.  Respiratory:  Negative for cough and shortness of breath.   Cardiovascular:  Negative for chest pain, palpitations and leg swelling.  Genitourinary:  Negative for decreased urine volume and difficulty urinating.  Neurological:  Negative for dizziness, tremors, seizures, syncope, facial asymmetry, speech difficulty, weakness, light-headedness, numbness and headaches.  Psychiatric/Behavioral:  Positive for agitation, decreased concentration, dysphoric mood and sleep disturbance.  Negative for behavioral problems, confusion, hallucinations, self-injury and suicidal ideas. The patient is nervous/anxious. The patient is not hyperactive.   All other systems reviewed and are negative.      Objective:  BP (!) 148/92    Pulse (!) 59    Temp 98.5 F (36.9 C)    Ht 5' (1.524 m)    Wt 225 lb (102.1 kg)    LMP 06/17/2021 (Exact Date)    SpO2 97%    BMI 43.94 kg/m    Wt Readings from Last 3 Encounters:  06/17/21 225 lb (102.1 kg)  05/21/21 225 lb (102.1 kg)  05/19/21 225 lb 8 oz (102.3 kg)    Physical Exam Vitals and nursing note reviewed.  Constitutional:      General: She is not in acute distress.    Appearance: Normal appearance. She is well-developed and well-groomed. She is obese. She is not ill-appearing, toxic-appearing or diaphoretic.  HENT:     Head: Normocephalic and atraumatic.     Jaw: There is normal jaw occlusion.     Right Ear: Hearing normal.     Left Ear: Hearing normal.     Nose: Nose normal.     Mouth/Throat:     Lips: Pink.     Mouth: Mucous membranes are moist.     Pharynx: Oropharynx is clear. Uvula midline.  Eyes:     General: Lids are normal.     Extraocular Movements: Extraocular movements intact.     Conjunctiva/sclera: Conjunctivae normal.     Pupils: Pupils are equal, round, and reactive to light.  Neck:     Thyroid: No thyroid mass, thyromegaly or thyroid tenderness.     Vascular: No carotid bruit or JVD.     Trachea: Trachea and phonation normal.  Cardiovascular:     Rate and Rhythm: Normal rate and regular rhythm.     Chest Wall: PMI is not displaced.     Pulses: Normal pulses.     Heart sounds: Normal heart sounds. No murmur heard.   No friction rub. No gallop.  Pulmonary:     Effort: Pulmonary effort is normal. No respiratory distress.  Breath sounds: Normal breath sounds. No wheezing.  Abdominal:     General: Bowel sounds are normal. There is no distension or abdominal bruit.     Palpations: Abdomen is soft. There is  no hepatomegaly or splenomegaly.     Tenderness: There is no abdominal tenderness. There is no right CVA tenderness or left CVA tenderness.     Hernia: No hernia is present.  Musculoskeletal:        General: Normal range of motion.     Cervical back: Normal range of motion and neck supple.     Right lower leg: No edema.     Left lower leg: No edema.  Lymphadenopathy:     Cervical: No cervical adenopathy.  Skin:    General: Skin is warm and dry.     Capillary Refill: Capillary refill takes less than 2 seconds.     Coloration: Skin is not cyanotic, jaundiced or pale.     Findings: No rash.  Neurological:     General: No focal deficit present.     Mental Status: She is alert and oriented to person, place, and time.     Sensory: Sensation is intact.     Motor: Motor function is intact.     Coordination: Coordination is intact.     Gait: Gait is intact.     Deep Tendon Reflexes: Reflexes are normal and symmetric.  Psychiatric:        Attention and Perception: Attention and perception normal.        Mood and Affect: Affect normal. Mood is anxious.        Speech: Speech normal.        Behavior: Behavior normal. Behavior is cooperative.        Thought Content: Thought content normal.        Cognition and Memory: Cognition and memory normal.        Judgment: Judgment normal.    Results for orders placed or performed in visit on 06/05/21  COVID-19, Flu A+B and RSV   Specimen: Nasopharyngeal(NP) swabs in vial transport medium  Result Value Ref Range   SARS-CoV-2, NAA Not Detected Not Detected   Influenza A, NAA Not Detected Not Detected   Influenza B, NAA Not Detected Not Detected   RSV, NAA Not Detected Not Detected   Test Information: Comment        Pertinent labs & imaging results that were available during my care of the patient were reviewed by me and considered in my medical decision making.  Assessment & Plan:  [place] was seen today for follow-up.  Diagnoses and all  orders for this visit:  GAD (generalized anxiety disorder) Current moderate 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 episode ([place]) Mixed depressive and anxiety symptoms.  Will check thyroid for possible underlying causes.  Will initiate fluoxetine 20 mg daily.  Patient to follow-up in office in 2 to 3 weeks for reevaluation, or sooner if needed.  Crisis hotline information provided to patient in office today.  Medication dosing discussed in detail.  Patient aware medications will take several weeks to get to full effect. -     Thyroid Panel With TSH -     FLUoxetine (PROZAC) 20 MG capsule; Take 1 capsule (20 mg total) by mouth daily.  Elevated blood-pressure reading without diagnosis of hypertension Patient is anxious in office today. DASH Diet and exercise encouraged.  Patient aware to monitor at home and report any persistent high readings.  Will check below labs  for possible underlying causes. -     Thyroid Panel With TSH -     CBC with Differential/Platelet -     BMP8+EGFR -     Lipid panel     Continue all other maintenance medications.  Follow up plan: Return in about 2 weeks (around 07/01/2021), or if symptoms worsen or fail to improve, for anxiety, depression .   Continue healthy lifestyle choices, including diet (rich in fruits, vegetables, and lean proteins, and low in salt and simple carbohydrates) and exercise (at least 30 minutes of moderate physical activity daily).  Educational handout given for depression, crisis hotline information  The above assessment and management plan was discussed with the patient. The patient verbalized understanding of and has agreed to the management plan. Patient is aware to call the clinic if they develop any new symptoms or if symptoms persist or worsen. Patient is aware when to return to the clinic for a follow-up visit. Patient educated on when it is appropriate to go to the emergency department.   Monia Pouch, FNP-C [facility] [phone]

## 2021-06-18 LAB — CBC WITH DIFFERENTIAL/PLATELET
Basophils Absolute: 0 10*3/uL (ref 0.0–0.2)
Basos: 0 %
EOS (ABSOLUTE): 0.1 10*3/uL (ref 0.0–0.4)
Eos: 2 %
Hematocrit: 42.4 % (ref 34.0–46.6)
Hemoglobin: 14.4 g/dL (ref 11.1–15.9)
Immature Grans (Abs): 0 10*3/uL (ref 0.0–0.1)
Immature Granulocytes: 0 %
Lymphocytes Absolute: 2 10*3/uL (ref 0.7–3.1)
Lymphs: 30 %
MCH: 28.4 pg (ref 26.6–33.0)
MCHC: 34 g/dL (ref 31.5–35.7)
MCV: 84 fL (ref 79–97)
Monocytes Absolute: 0.5 10*3/uL (ref 0.1–0.9)
Monocytes: 8 %
Neutrophils Absolute: 4.1 10*3/uL (ref 1.4–7.0)
Neutrophils: 60 %
Platelets: 281 10*3/uL (ref 150–450)
RBC: 5.07 x10E6/uL (ref 3.77–5.28)
RDW: 12.4 % (ref 11.7–15.4)
WBC: 6.8 10*3/uL (ref 3.4–10.8)

## 2021-06-18 LAB — LIPID PANEL
Chol/HDL Ratio: 3.9 ratio (ref 0.0–4.4)
Cholesterol, Total: 171 mg/dL (ref 100–199)
HDL: 44 mg/dL (ref >39)
LDL Chol Calc (NIH): 112 mg/dL — ABNORMAL HIGH (ref 0–99)
Triglycerides: 80 mg/dL (ref 0–149)
VLDL Cholesterol Cal: 15 mg/dL (ref 5–40)

## 2021-06-18 LAB — THYROID PANEL WITH TSH
Free Thyroxine Index: 1.8 (ref 1.2–4.9)
T3 Uptake Ratio: 24 % (ref 24–39)
T4, Total: 7.4 ug/dL (ref 4.5–12.0)
TSH: 1.63 u[IU]/mL (ref 0.450–4.500)

## 2021-06-18 LAB — BMP8+EGFR
BUN/Creatinine Ratio: 13 (ref 9–23)
BUN: 10 mg/dL (ref 6–20)
CO2: 24 mmol/L (ref 20–29)
Calcium: 9.5 mg/dL (ref 8.7–10.2)
Chloride: 104 mmol/L (ref 96–106)
Creatinine, Ser: 0.76 mg/dL (ref 0.57–1.00)
Glucose: 103 mg/dL — ABNORMAL HIGH (ref 70–99)
Potassium: 4.5 mmol/L (ref 3.5–5.2)
Sodium: 141 mmol/L (ref 134–144)
eGFR: 108 mL/min/{1.73_m2} (ref >59)

## 2021-06-19 ENCOUNTER — Ambulatory Visit (HOSPITAL_COMMUNITY): Payer: Medicaid Other

## 2021-06-19 ENCOUNTER — Telehealth (HOSPITAL_COMMUNITY): Payer: Self-pay

## 2021-06-19 NOTE — Telephone Encounter (Signed)
No Show Note Left message stating patient had an appointment this morning at 8:45. Reminder of appointment tomorrow morning, 12/30, at 11:00 am and to please call if unable to attend.   Indie Nickerson Hartnett-Rands, PT 06/19/2021 10:31 am

## 2021-06-20 ENCOUNTER — Ambulatory Visit (HOSPITAL_COMMUNITY): Payer: Medicaid Other

## 2021-06-27 ENCOUNTER — Other Ambulatory Visit: Payer: Self-pay

## 2021-06-27 ENCOUNTER — Ambulatory Visit: Payer: Medicaid Other | Admitting: Nurse Practitioner

## 2021-06-27 ENCOUNTER — Encounter: Payer: Self-pay | Admitting: Nurse Practitioner

## 2021-06-27 VITALS — BP 136/78 | HR 63 | Temp 99.1°F | Ht 60.0 in | Wt 226.5 lb

## 2021-06-27 DIAGNOSIS — R300 Dysuria: Secondary | ICD-10-CM | POA: Diagnosis not present

## 2021-06-27 LAB — URINALYSIS, ROUTINE W REFLEX MICROSCOPIC
Bilirubin, UA: NEGATIVE
Glucose, UA: NEGATIVE
Ketones, UA: NEGATIVE
Leukocytes,UA: NEGATIVE
Nitrite, UA: NEGATIVE
Protein,UA: NEGATIVE
Specific Gravity, UA: >1.03 — ABNORMAL HIGH (ref 1.005–1.030)
Urobilinogen, Ur: 0.2 mg/dL (ref 0.2–1.0)
pH, UA: 5.5 (ref 5.0–7.5)

## 2021-06-27 LAB — MICROSCOPIC EXAMINATION: Renal Epithel, UA: NONE SEEN /hpf

## 2021-06-27 MED ORDER — SULFAMETHOXAZOLE-TRIMETHOPRIM 800-160 MG PO TABS
1.0000 | ORAL_TABLET | Freq: Two times a day (BID) | ORAL | 0 refills | Status: DC
Start: 2021-06-27 — End: 2021-07-17

## 2021-06-27 MED ORDER — PHENAZOPYRIDINE HCL 95 MG PO TABS: 95.0000 mg | ORAL_TABLET | Freq: Three times a day (TID) | ORAL | 0 refills | Status: DC | PRN

## 2021-06-27 NOTE — Progress Notes (Signed)
Acute Office Visit  Subjective:    Patient ID: Mckenzie Cooley, female    DOB: 1991-01-19, 31 y.o.   MRN: [id number]  Chief Complaint  Patient presents with   Dysuria    Dysuria  This is a new problem. Episode onset: in the past 3 days. The problem occurs every urination. The problem has been unchanged. The quality of the pain is described as aching. The pain is moderate. There has been no fever. Associated symptoms include a discharge and flank pain. Pertinent negatives include no chills or nausea. She has tried nothing for the symptoms.    Past Medical History:  Diagnosis Date   Acid reflux    Anemia    Bloating    Childhood asthma    Esophageal dysmotility    History of blood transfusion    PCOS (polycystic ovarian syndrome) 09/27/2014   Preeclampsia    Seasonal allergies     Past Surgical History:  Procedure Laterality Date   BIOPSY  02/26/2021   Procedure: BIOPSY;  Surgeon: Rogene Houston, MD;  [facility];  Service: Endoscopy;;  Esophageal biopsy   carpal tunnel Left 01/30/2021   CARPAL TUNNEL RELEASE Right 03/27/2021   CESAREAN SECTION N/A 01/02/2017   Procedure: CESAREAN SECTION;  Surgeon: Chancy Milroy, MD;  Location: [place];  Service: Obstetrics;  Laterality: N/A;   CHOLECYSTECTOMY  2011   Dr.Byerly   ESOPHAGEAL DILATION N/A 02/26/2021   Procedure: ESOPHAGEAL DILATION;  Surgeon: Rogene Houston, MD;  [facility];  Service: Endoscopy;  Laterality: N/A;   ESOPHAGOGASTRODUODENOSCOPY N/A 10/26/2014   Procedure: ESOPHAGOGASTRODUODENOSCOPY (EGD);  Surgeon: Rogene Houston, MD;  [facility];  Service: Endoscopy;  Laterality: N/A;   ESOPHAGOGASTRODUODENOSCOPY (EGD) WITH PROPOFOL N/A 02/26/2021   Procedure: ESOPHAGOGASTRODUODENOSCOPY (EGD) WITH PROPOFOL;  Surgeon: Rogene Houston, MD;  [facility];  Service: Endoscopy;  Laterality: N/A;   POLYPECTOMY  02/26/2021   Procedure: POLYPECTOMY;  Surgeon:  Rogene Houston, MD;  [facility];  Service: Endoscopy;;  Gastric Body Polyps x 3    UPPER GASTROINTESTINAL ENDOSCOPY  12/05/2009   UPPER GASTROINTESTINAL ENDOSCOPY  02/07/2009   WISDOM TOOTH EXTRACTION     WISDOM TOOTH EXTRACTION      Family History  Problem Relation Age of Onset   Kidney disease Maternal Grandmother    Renal Disease Maternal Grandmother    Heart disease Father    Hypertension Mother    Cancer - Cervical Maternal Aunt     Social History   Socioeconomic History   Marital status: Married    Spouse name: Not on file   Number of children: 1   Years of education: Not on file   Highest education level: Not on file  Occupational History   Not on file  Tobacco Use   Smoking status: Former    Packs/day: 0.50    Years: 12.00    Pack years: 6.00    Types: Cigarettes    Quit date: 05/05/2016    Years since quitting: 5.1   Smokeless tobacco: Never  Vaping Use   Vaping Use: Never used  Substance and Sexual Activity   Alcohol use: No    Alcohol/week: 0.0 standard drinks   Drug use: No   Sexual activity: Yes    Birth control/protection: Condom  Other Topics Concern   Not on file  Social History Narrative   Not on file   Social Determinants of Health  Financial Resource Strain: Low Risk    Difficulty of Paying Living Expenses: Not hard at all  Food Insecurity: No Food Insecurity   Worried About Charity fundraiser in the Last Year: Never true   Ran Out of Food in the Last Year: Never true  Transportation Needs: No Transportation Needs   Lack of Transportation (Medical): No   Lack of Transportation (Non-Medical): No  Physical Activity: Inactive   Days of Exercise per Week: 0 days   Minutes of Exercise per Session: 0 min  Stress: No Stress Concern Present   Feeling of Stress : Not at all  Social Connections: Moderately Integrated   Frequency of Communication with Friends and Family: More than three times a week   Frequency of Social  Gatherings with Friends and Family: Once a week   Attends Religious Services: 1 to 4 times per year   Active Member of [employer] or Organizations: No   Attends Archivist Meetings: Never   Marital Status: Married  Human resources officer Violence: Not At Risk   Fear of Current or Ex-Partner: No   Emotionally Abused: No   Physically Abused: No   Sexually Abused: No    Outpatient Medications Prior to Visit  Medication Sig Dispense Refill   acetaminophen (TYLENOL) 500 MG tablet Take 500 mg by mouth every 6 (six) hours as needed.     cetirizine HCl (ZYRTEC) 5 MG/5ML SOLN Take 10 mLs (10 mg total) by mouth daily. 300 mL 0   esomeprazole (NEXIUM) 20 MG capsule Take 20 mg by mouth daily at 12 noon.     FLUoxetine (PROZAC) 20 MG capsule Take 1 capsule (20 mg total) by mouth daily. 90 capsule 3   promethazine-dextromethorphan (PROMETHAZINE-DM) 6.25-15 MG/5ML syrup Take 5 mLs by mouth 4 (four) times daily as needed for cough. 118 mL 0   No facility-administered medications prior to visit.    Allergies  Allergen Reactions   Percocet [Oxycodone-Acetaminophen] Hives and Itching   Prilosec [Omeprazole] Other (See Comments)    Stomach cramp   Amoxicillin Rash    Has patient had a PCN reaction causing immediate rash, facial/tongue/throat swelling, SOB or lightheadedness with hypotension: Yes -mild rash Has patient had a PCN reaction causing severe rash involving mucus membranes or skin necrosis: No Has patient had a PCN reaction that required hospitalization: No Has patient had a PCN reaction occurring within the last 10 years: Yes If all of the above answers are "NO", then may proceed with Cephalosporin use. Has tolerated ceftriaxone & cephalexin     Review of Systems  Constitutional:  Negative for chills.  HENT: Negative.    Eyes: Negative.   Respiratory: Negative.    Gastrointestinal:  Negative for nausea.  Genitourinary:  Positive for dysuria and flank pain.  All other systems reviewed  and are negative.     Objective:    Physical Exam Vitals and nursing note reviewed.  Constitutional:      Appearance: Normal appearance.  HENT:     Head: Normocephalic.     Right Ear: External ear normal.     Left Ear: External ear normal.     Nose: Nose normal.     Mouth/Throat:     Mouth: Mucous membranes are moist.     Pharynx: Oropharynx is clear.  Eyes:     Conjunctiva/sclera: Conjunctivae normal.  Cardiovascular:     Rate and Rhythm: Normal rate and regular rhythm.     Pulses: Normal pulses.     Heart  sounds: Normal heart sounds.  Pulmonary:     Effort: Pulmonary effort is normal.     Breath sounds: Normal breath sounds.  Abdominal:     General: Bowel sounds are normal.     Tenderness: There is right CVA tenderness and left CVA tenderness.  Skin:    General: Skin is warm.     Findings: No rash.  Neurological:     General: No focal deficit present.     Mental Status: She is alert and oriented to person, place, and time.  Psychiatric:        Behavior: Behavior normal.    BP 136/78    Pulse 63    Temp 99.1 F (37.3 C) (Temporal)    Ht 5' (1.524 m)    Wt 226 lb 8 oz (102.7 kg)    LMP 06/17/2021 (Exact Date)    BMI 44.24 kg/m  Wt Readings from Last 3 Encounters:  06/27/21 226 lb 8 oz (102.7 kg)  06/17/21 225 lb (102.1 kg)  05/21/21 225 lb (102.1 kg)    Health Maintenance Due  Topic Date Due   Hepatitis C Screening  Never done    There are no preventive care reminders to display for this patient.   Lab Results  Component Value Date   TSH 1.630 06/17/2021   Lab Results  Component Value Date   WBC 6.8 06/17/2021   HGB 14.4 06/17/2021   HCT 42.4 06/17/2021   MCV 84 06/17/2021   PLT 281 06/17/2021   Lab Results  Component Value Date   NA 141 06/17/2021   K 4.5 06/17/2021   CO2 24 06/17/2021   GLUCOSE 103 (H) 06/17/2021   BUN 10 06/17/2021   CREATININE 0.76 06/17/2021   BILITOT 0.6 04/14/2021   ALKPHOS 69 04/14/2021   AST 23 04/14/2021   ALT  29 04/14/2021   PROT 7.2 04/14/2021   ALBUMIN 4.3 04/14/2021   CALCIUM 9.5 06/17/2021   ANIONGAP 9 02/26/2021   EGFR 108 06/17/2021   Lab Results  Component Value Date   CHOL 171 06/17/2021   Lab Results  Component Value Date   HDL 44 06/17/2021   Lab Results  Component Value Date   LDLCALC 112 (H) 06/17/2021   Lab Results  Component Value Date   TRIG 80 06/17/2021   Lab Results  Component Value Date   CHOLHDL 3.9 06/17/2021   Lab Results  Component Value Date   HGBA1C 5.2 08/20/2020       Assessment & Plan:  Dysuria symptoms not well controlled in the past 3 to 4 days.  Patient took an at home UTI test that resulted positive.   Appears well, in no apparent distress.  Vital signs are normal. The abdomen is soft without tenderness, guarding, mass, rebound or organomegaly.  CVA tenderness present, no inguinal adenopathy noted. Urine dipstick shows positive for RBC's.  Micro exam: negative for WBC's or RBC's and few+ bacteria.    UTI uncomplicated without evidence of pyelonephritis   Treatment per orders - also push fluids, may use Pyridium OTC prn. Call or return to clinic prn if these symptoms worsen or fail to improve as anticipated.    Problem List Items Addressed This Visit   None Visit Diagnoses     Dysuria    -  Primary   Relevant Medications   sulfamethoxazole-trimethoprim (BACTRIM DS) 800-160 MG tablet   phenazopyridine (PYRIDIUM) 95 MG tablet   Other Relevant Orders   Urinalysis, Routine w reflex microscopic  CULTURE, URINE COMPREHENSIVE        Meds ordered this encounter  Medications   sulfamethoxazole-trimethoprim (BACTRIM DS) 800-160 MG tablet    Sig: Take 1 tablet by mouth 2 (two) times daily.    Dispense:  14 tablet    Refill:  0   phenazopyridine (PYRIDIUM) 95 MG tablet    Sig: Take 1 tablet (95 mg total) by mouth 3 (three) times daily as needed for pain.    Dispense:  10 tablet    Refill:  0     Ivy Lynn, NP

## 2021-06-27 NOTE — Patient Instructions (Signed)
Dysuria ?Dysuria is pain or discomfort during urination. The pain or discomfort may be felt in the part of the body that drains urine from the bladder (urethra) or in the surrounding tissue of the genitals. The pain may also be felt in the groin area, lower abdomen, or lower back. ?You may have to urinate frequently or have the sudden feeling that you have to urinate (urgency). Dysuria can affect anyone, but it is more common in females. Dysuria can be caused by many different things, including: ?Urinary tract infection. ?Kidney stones or bladder stones. ?Certain STIs (sexually transmitted infections), such as chlamydia. ?Dehydration. ?Inflammation of the tissues of the vagina. ?Use of certain medicines. ?Use of certain soaps or scented products that cause irritation. ?Follow these instructions at home: ?Medicines ?Take over-the-counter and prescription medicines only as told by your health care provider. ?If you were prescribed an antibiotic medicine, take it as told by your health care provider. Do not stop taking the antibiotic even if you start to feel better. ?Eating and drinking ? ?Drink enough fluid to keep your urine pale yellow. ?Avoid caffeinated beverages, tea, and alcohol. These beverages can irritate the bladder and make dysuria worse. In males, alcohol may irritate the prostate. ?General instructions ?Watch your condition for any changes. ?Urinate often. Avoid holding urine for long periods of time. ?If you are female, you should wipe from front to back after urinating or having a bowel movement. Use each piece of toilet paper only once. ?Empty your bladder after sex. ?Keep all follow-up visits. This is important. ?If you had any tests done to find the cause of dysuria, it is up to you to get your test results. Ask your health care provider, or the department that is doing the test, when your results will be ready. ?Contact a health care provider if: ?You have a fever. ?You develop pain in your back or  sides. ?You have nausea or vomiting. ?You have blood in your urine. ?You are not urinating as often as you usually do. ?Get help right away if: ?Your pain is severe and not relieved with medicines. ?You cannot eat or drink without vomiting. ?You are confused. ?You have a rapid heartbeat while resting. ?You have shaking or chills. ?You feel extremely weak. ?Summary ?Dysuria is pain or discomfort while urinating. Many different conditions can lead to dysuria. ?If you have dysuria, you may have to urinate frequently or have the sudden feeling that you have to urinate (urgency). ?Watch your condition for any changes. Keep all follow-up visits. ?Make sure that you urinate often and drink enough fluid to keep your urine pale yellow. ?This information is not intended to replace advice given to you by your health care provider. Make sure you discuss any questions you have with your health care provider. ?Document Revised: 01/19/2020 Document Reviewed: 01/19/2020 ?Elsevier Patient Education ? 2022 Elsevier Inc[employer] ? ?

## 2021-07-01 ENCOUNTER — Encounter: Payer: Self-pay | Admitting: Family Medicine

## 2021-07-01 ENCOUNTER — Ambulatory Visit: Payer: Medicaid Other | Admitting: Family Medicine

## 2021-07-01 VITALS — BP 124/78 | HR 66 | Temp 98.1°F | Ht 60.0 in | Wt 228.0 lb

## 2021-07-01 DIAGNOSIS — F321 Major depressive disorder, single episode, moderate: Secondary | ICD-10-CM | POA: Diagnosis not present

## 2021-07-01 DIAGNOSIS — F411 Generalized anxiety disorder: Secondary | ICD-10-CM

## 2021-07-01 LAB — CULTURE, URINE COMPREHENSIVE

## 2021-07-01 MED ORDER — FLUOXETINE HCL 40 MG PO CAPS: 40.0000 mg | ORAL_CAPSULE | Freq: Every day | ORAL | 3 refills | Status: DC

## 2021-07-01 NOTE — Patient Instructions (Signed)

## 2021-07-01 NOTE — Progress Notes (Signed)
Subjective:  Patient ID: Mckenzie Cooley, female    DOB: 05-29-1991, 31 y.o.   MRN: [id number]  Patient Care Team: Gwenlyn Perking, FNP as PCP - General (Family Medicine)   Chief Complaint:  Anxiety (2 week follow up/)   HPI: Mckenzie Cooley is a 31 y.o. female presenting on 07/01/2021 for Anxiety (2 week follow up/)   Pt presents today for follow up after initiation of fluoxetine for GAD and depression. States she has been taking medications as prescribed and has noticed a great improvement in symptoms. States she continues to have some anxiety, irritability, and worry but it has improved. She denies SI or HI. No adverse side effects from medications.   Anxiety Presents for follow-up visit. Symptoms include excessive worry, irritability, nervous/anxious behavior and palpitations. Patient reports no chest pain, compulsions, confusion, decreased concentration, depressed mood, dizziness, dry mouth, feeling of choking, hyperventilation, impotence, insomnia, malaise, muscle tension, nausea, obsessions, panic, restlessness, shortness of breath or suicidal ideas. Symptoms occur most days. The severity of symptoms is mild. The quality of sleep is good. Nighttime awakenings: none.   Compliance with medications is 76-100%.   Depression screen [facility] 2/9 07/01/2021 06/27/2021 06/17/2021 04/01/2021 02/06/2021  Decreased Interest 0 0 1 0 0  Down, Depressed, Hopeless 0 0 0 0 0  PHQ - 2 Score 0 0 1 0 0  Altered sleeping 0 0 3 - 0  Tired, decreased energy 0 0 3 - 0  Change in appetite 0 0 0 - 0  Feeling bad or failure about yourself  0 0 0 - 0  Trouble concentrating 0 0 1 - 0  Moving slowly or fidgety/restless 0 0 0 - 0  Suicidal thoughts 0 0 0 - 0  PHQ-9 Score 0 0 8 - 0  Difficult doing work/chores Not difficult at all Not difficult at all - - -  Some recent data might be hidden   GAD 7 : Generalized Anxiety Score 07/01/2021 06/27/2021 06/17/2021 02/06/2021  Nervous, Anxious, on Edge 0 0 3 0   Control/stop worrying 0 0 3 0  Worry too much - different things 0 0 3 0  Trouble relaxing 0 0 3 0  Restless 0 0 3 0  Easily annoyed or irritable 0 0 3 0  Afraid - awful might happen 0 0 3 0  Total GAD 7 Score 0 0 21 0  Anxiety Difficulty Not difficult at all Not difficult at all - -       Relevant past medical, surgical, family, and social history reviewed and updated as indicated.  Allergies and medications reviewed and updated. Data reviewed: Chart in Epic.   Past Medical History:  Diagnosis Date   Acid reflux    Anemia    Bloating    Childhood asthma    Esophageal dysmotility    History of blood transfusion    PCOS (polycystic ovarian syndrome) 09/27/2014   Preeclampsia    Seasonal allergies     Past Surgical History:  Procedure Laterality Date   BIOPSY  02/26/2021   Procedure: BIOPSY;  Surgeon: Rogene Houston, MD;  [facility];  Service: Endoscopy;;  Esophageal biopsy   carpal tunnel Left 01/30/2021   CARPAL TUNNEL RELEASE Right 03/27/2021   CESAREAN SECTION N/A 01/02/2017   Procedure: CESAREAN SECTION;  Surgeon: Chancy Milroy, MD;  Location: [place];  Service: Obstetrics;  Laterality: N/A;   CHOLECYSTECTOMY  2011   Dr.Byerly   ESOPHAGEAL DILATION N/A  02/26/2021   Procedure: ESOPHAGEAL DILATION;  Surgeon: Rogene Houston, MD;  [facility];  Service: Endoscopy;  Laterality: N/A;   ESOPHAGOGASTRODUODENOSCOPY N/A 10/26/2014   Procedure: ESOPHAGOGASTRODUODENOSCOPY (EGD);  Surgeon: Rogene Houston, MD;  [facility];  Service: Endoscopy;  Laterality: N/A;   ESOPHAGOGASTRODUODENOSCOPY (EGD) WITH PROPOFOL N/A 02/26/2021   Procedure: ESOPHAGOGASTRODUODENOSCOPY (EGD) WITH PROPOFOL;  Surgeon: Rogene Houston, MD;  [facility];  Service: Endoscopy;  Laterality: N/A;   POLYPECTOMY  02/26/2021   Procedure: POLYPECTOMY;  Surgeon: Rogene Houston, MD;  [facility];  Service: Endoscopy;;  Gastric Body  Polyps x 3    UPPER GASTROINTESTINAL ENDOSCOPY  12/05/2009   UPPER GASTROINTESTINAL ENDOSCOPY  02/07/2009   WISDOM TOOTH EXTRACTION     WISDOM TOOTH EXTRACTION      Social History   Socioeconomic History   Marital status: Married    Spouse name: Not on file   Number of children: 1   Years of education: Not on file   Highest education level: Not on file  Occupational History   Not on file  Tobacco Use   Smoking status: Former    Packs/day: 0.50    Years: 12.00    Pack years: 6.00    Types: Cigarettes    Quit date: 05/05/2016    Years since quitting: 5.1   Smokeless tobacco: Never  Vaping Use   Vaping Use: Never used  Substance and Sexual Activity   Alcohol use: No    Alcohol/week: 0.0 standard drinks   Drug use: No   Sexual activity: Yes    Birth control/protection: Condom  Other Topics Concern   Not on file  Social History Narrative   Not on file   Social Determinants of Health   Financial Resource Strain: Low Risk    Difficulty of Paying Living Expenses: Not hard at all  Food Insecurity: No Food Insecurity   Worried About Charity fundraiser in the Last Year: Never true   [place] in the Last Year: Never true  Transportation Needs: No Transportation Needs   Lack of Transportation (Medical): No   Lack of Transportation (Non-Medical): No  Physical Activity: Inactive   Days of Exercise per Week: 0 days   Minutes of Exercise per Session: 0 min  Stress: No Stress Concern Present   Feeling of Stress : Not at all  Social Connections: Moderately Integrated   Frequency of Communication with Friends and Family: More than three times a week   Frequency of Social Gatherings with Friends and Family: Once a week   Attends Religious Services: 1 to 4 times per year   Active Member of [employer] or Organizations: No   Attends Archivist Meetings: Never   Marital Status: Married  Human resources officer Violence: Not At Risk   Fear of Current or Ex-Partner: No    Emotionally Abused: No   Physically Abused: No   Sexually Abused: No    Outpatient Encounter Medications as of 07/01/2021  Medication Sig   acetaminophen (TYLENOL) 500 MG tablet Take 500 mg by mouth every 6 (six) hours as needed.   cetirizine HCl (ZYRTEC) 5 MG/5ML SOLN Take 10 mLs (10 mg total) by mouth daily.   esomeprazole (NEXIUM) 20 MG capsule Take 20 mg by mouth daily at 12 noon.   FLUoxetine (PROZAC) 40 MG capsule Take 1 capsule (40 mg total) by mouth daily.   phenazopyridine (PYRIDIUM) 95 MG tablet Take 1 tablet (  95 mg total) by mouth 3 (three) times daily as needed for pain.   sulfamethoxazole-trimethoprim (BACTRIM DS) 800-160 MG tablet Take 1 tablet by mouth 2 (two) times daily.   [DISCONTINUED] FLUoxetine (PROZAC) 20 MG capsule Take 1 capsule (20 mg total) by mouth daily.   No facility-administered encounter medications on file as of 07/01/2021.    Allergies  Allergen Reactions   Percocet [Oxycodone-Acetaminophen] Hives and Itching   Prilosec [Omeprazole] Other (See Comments)    Stomach cramp   Amoxicillin Rash    Has patient had a PCN reaction causing immediate rash, facial/tongue/throat swelling, SOB or lightheadedness with hypotension: Yes -mild rash Has patient had a PCN reaction causing severe rash involving mucus membranes or skin necrosis: No Has patient had a PCN reaction that required hospitalization: No Has patient had a PCN reaction occurring within the last 10 years: Yes If all of the above answers are "NO", then may proceed with Cephalosporin use. Has tolerated ceftriaxone & cephalexin     Review of Systems  Constitutional:  Positive for irritability. Negative for activity change, appetite change, chills, diaphoresis, fatigue, fever and unexpected weight change.  Eyes:  Negative for photophobia and visual disturbance.  Respiratory:  Negative for cough and shortness of breath.   Cardiovascular:  Positive for palpitations. Negative for chest pain and leg  swelling.  Gastrointestinal:  Negative for abdominal pain and nausea.  Genitourinary:  Negative for decreased urine volume, difficulty urinating and impotence.  Neurological:  Negative for dizziness.  Psychiatric/Behavioral:  Positive for agitation. Negative for behavioral problems, confusion, decreased concentration, dysphoric mood, hallucinations, self-injury, sleep disturbance and suicidal ideas. The patient is nervous/anxious. The patient does not have insomnia and is not hyperactive.   All other systems reviewed and are negative.      Objective:  BP 124/78    Pulse 66    Temp 98.1 F (36.7 C)    Ht 5' (1.524 m)    Wt 228 lb (103.4 kg)    LMP 06/17/2021 (Exact Date)    SpO2 98%    BMI 44.53 kg/m    Wt Readings from Last 3 Encounters:  07/01/21 228 lb (103.4 kg)  06/27/21 226 lb 8 oz (102.7 kg)  06/17/21 225 lb (102.1 kg)    Physical Exam Vitals and nursing note reviewed.  Constitutional:      General: She is not in acute distress.    Appearance: Normal appearance. She is obese. She is not ill-appearing, toxic-appearing or diaphoretic.  HENT:     Head: Atraumatic.  Eyes:     Conjunctiva/sclera: Conjunctivae normal.     Pupils: Pupils are equal, round, and reactive to light.  Cardiovascular:     Rate and Rhythm: Regular rhythm.     Heart sounds: Normal heart sounds.  Pulmonary:     Breath sounds: Normal breath sounds.  Skin:    General: Skin is dry.     Capillary Refill: Capillary refill takes less than 2 seconds.  Neurological:     General: No focal deficit present.     Mental Status: She is alert and oriented to person, place, and time.  Psychiatric:        Mood and Affect: Mood normal.        Behavior: Behavior normal.        Thought Content: Thought content normal.    Results for orders placed or performed in visit on 06/27/21  CULTURE, URINE COMPREHENSIVE   Specimen: Urine   UR  Result Value Ref Range  Urine Culture, Comprehensive Final report (A)     Organism ID, Bacteria Escherichia coli (A)    ANTIMICROBIAL SUSCEPTIBILITY Comment   Microscopic Examination   Urine  Result Value Ref Range   WBC, UA 0-5 0 - 5 /hpf   RBC 0-2 0 - 2 /hpf   Epithelial Cells (non renal) 0-10 0 - 10 /hpf   Renal Epithel, UA None seen None seen /hpf   Bacteria, UA Few (A) None seen/Few  Urinalysis, Routine w reflex microscopic  Result Value Ref Range   Specific Gravity, UA >1.030 (H) 1.005 - 1.030   pH, UA 5.5 5.0 - 7.5   Color, UA Yellow Yellow   Appearance Ur Clear Clear   Leukocytes,UA Negative Negative   Protein,UA Negative Negative/Trace   Glucose, UA Negative Negative   Ketones, UA Negative Negative   RBC, UA 2+ (A) Negative   Bilirubin, UA Negative Negative   Urobilinogen, Ur 0.2 0.2 - 1.0 mg/dL   Nitrite, UA Negative Negative   Microscopic Examination See below:        Pertinent labs & imaging results that were available during my care of the patient were reviewed by me and considered in my medical decision making.  Assessment & Plan:  Tarrin was seen today for anxiety.  Diagnoses and all orders for this visit:  GAD (generalized anxiety disorder) Current moderate 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 episode ([place]) Improvement with fluoxetine but feels symptoms could be better controlled. No SI or HI. No adverse side effects from medications. Will increase dose to 40 mg daily. Follow up in 8 weeks for reevaluation or sooner if warranted.  -     FLUoxetine (PROZAC) 40 MG capsule; Take 1 capsule (40 mg total) by mouth daily.     Continue all other maintenance medications.  Follow up plan: Return in about 8 weeks (around 08/26/2021), or if symptoms worsen or fail to improve, for GAD, depression .   Continue healthy lifestyle choices, including diet (rich in fruits, vegetables, and lean proteins, and low in salt and simple carbohydrates) and exercise (at least 30 minutes of moderate physical activity daily).  Educational  handout given for depression   The above assessment and management plan was discussed with the patient. The patient verbalized understanding of and has agreed to the management plan. Patient is aware to call the clinic if they develop any new symptoms or if symptoms persist or worsen. Patient is aware when to return to the clinic for a follow-up visit. Patient educated on when it is appropriate to go to the emergency department.   Monia Pouch, FNP-C [facility] [phone]

## 2021-07-02 ENCOUNTER — Encounter (HOSPITAL_COMMUNITY): Payer: Self-pay

## 2021-07-02 ENCOUNTER — Ambulatory Visit (HOSPITAL_COMMUNITY): Admit: 2021-07-02 | Payer: Medicaid Other | Admitting: Gastroenterology

## 2021-07-02 SURGERY — MANOMETRY, ESOPHAGUS
Anesthesia: Choice

## 2021-07-07 ENCOUNTER — Telehealth: Payer: Self-pay | Admitting: Family Medicine

## 2021-07-07 NOTE — Telephone Encounter (Signed)
Pt is aware Rakes out of office today but will return tomorrow and address.

## 2021-07-07 NOTE — Telephone Encounter (Signed)
Pt returned missed call. Please call pt back with update on what provider is going to do about changing her Prozac Rx.

## 2021-07-08 ENCOUNTER — Encounter (INDEPENDENT_AMBULATORY_CARE_PROVIDER_SITE_OTHER): Payer: Self-pay | Admitting: Internal Medicine

## 2021-07-08 ENCOUNTER — Other Ambulatory Visit: Payer: Self-pay

## 2021-07-08 ENCOUNTER — Other Ambulatory Visit: Payer: Self-pay | Admitting: Family Medicine

## 2021-07-08 ENCOUNTER — Ambulatory Visit (INDEPENDENT_AMBULATORY_CARE_PROVIDER_SITE_OTHER): Payer: Medicaid Other | Admitting: Internal Medicine

## 2021-07-08 DIAGNOSIS — R1011 Right upper quadrant pain: Secondary | ICD-10-CM | POA: Insufficient documentation

## 2021-07-08 DIAGNOSIS — F321 Major depressive disorder, single episode, moderate: Secondary | ICD-10-CM

## 2021-07-08 DIAGNOSIS — K224 Dyskinesia of esophagus: Secondary | ICD-10-CM | POA: Diagnosis not present

## 2021-07-08 DIAGNOSIS — F411 Generalized anxiety disorder: Secondary | ICD-10-CM

## 2021-07-08 MED ORDER — DICYCLOMINE HCL 10 MG PO CAPS: 10.0000 mg | ORAL_CAPSULE | Freq: Three times a day (TID) | ORAL | 1 refills | Status: DC | PRN

## 2021-07-08 MED ORDER — SERTRALINE HCL 25 MG PO TABS: 25.0000 mg | ORAL_TABLET | Freq: Every day | ORAL | 3 refills | Status: DC

## 2021-07-08 NOTE — Progress Notes (Signed)
Presenting complaint; ° °Right upper quadrant pain. °History of GERD and dysphagia. 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 Steven Clayton of [facility].  Since he was not able to see her on initial visit she was seen by Dr. Stephen James of [facility] and Dr. Weinberg on 05/13/2021 PPI was added to her regimen.  She she had modified barium swallow and was referred to [facility].  She has an appointment to see Dr. Clayton next month. °Patient states her dysphagia is about the same.  She stays on soft foods.  She eats slowly and has not had any episodes of food impaction.  She says she is coping with dysphagia much better than she has been in the past.  She feels heartburn is well controlled.  She says she eats foods like pasta and rice and stays away from bread and meat.  She points to suprasternal area as well as upper chest at site of bolus obstruction.  She has not had any episode of food impaction. °She says heartburn is well controlled with esomeprazole. ° °Patient has new symptom today.  She complains of right upper quadrant pain which started 2 weeks ago.  Pain is intermittent and usually occurs with certain foods and associated with nausea which may last for an hour.  She says she has more pain when she is standing.  She denies melena or rectal bleeding.  Bowels move daily. °She has not lost any weight in the last 3 months. °Patient states Prozac caused stomach cramps.  She was just given prescription for sertraline which she will start soon. ° ° °Current Medications: °Outpatient Encounter Medications as of 07/08/2021  °Medication Sig  ° acetaminophen (TYLENOL) 500 MG tablet Take 500 mg by mouth every 6 (six) hours as needed.  ° cetirizine HCl (ZYRTEC) 5 MG/5ML SOLN Take 10 mLs (10 mg total) by mouth daily.  ° esomeprazole (NEXIUM) 20 MG capsule Take 20 mg by mouth  daily at 12 noon.  ° sertraline (ZOLOFT) 25 MG tablet Take 1 tablet (25 mg total) by mouth daily. (Patient not taking: Reported on 07/08/2021)  ° sulfamethoxazole-trimethoprim (BACTRIM DS) 800-160 MG tablet Take 1 tablet by mouth 2 (two) times daily.  ° [DISCONTINUED] FLUoxetine (PROZAC) 40 MG capsule Take 1 capsule (40 mg total) by mouth daily.  ° [DISCONTINUED] phenazopyridine (PYRIDIUM) 95 MG tablet Take 1 tablet (95 mg total) by mouth 3 (three) times daily as needed for pain.  ° °No facility-administered encounter medications on file as of 07/08/2021.  ° ° ° °Objective: °Blood pressure 121/85, pulse 67, temperature 98 °F (36.7 °C), temperature source Oral, height 5' (1.524 m), weight 230 lb (104.3 kg), last menstrual period 06/17/2021. °Patient is alert and in no acute distress. °Conjunctiva is pink. Sclera is nonicteric °Oropharyngeal mucosa is normal. °No neck masses or thyromegaly noted. °Cardiac exam with regular rhythm normal S1 and S2. No murmur or gallop noted. °Lungs are clear to auscultation. °Abdomen is full.  Laparoscopy scars noted across upper abdomen.  On palpation abdomen is soft.  Mild tenderness noted in right upper quadrant no guarding.  No organomegaly or masses. °No LE edema or clubbing noted. ° °Labs/studies Results: ° ° °CBC Latest Ref Rng & Units 06/17/2021 04/14/2021 02/26/2021  °WBC 3.4 - 10.8 x10E3/uL 6.8 9.3 9.2  °Hemoglobin 11.1 - 15.9 g/dL 14.4 13.9 14.6  °Hematocrit 34.0 - 46.6 % 42.4 41.7 44.6  °  Platelets 150 - 450 x10E3/uL 281 300 268  °  °CMP Latest Ref Rng & Units 06/17/2021 04/14/2021 02/26/2021  °Glucose 70 - 99 mg/dL 103(H) 90 104(H)  °BUN 6 - 20 mg/dL 10 11 9  °Creatinine 0.57 - 1.00 mg/dL 0.76 0.82 0.71  °Sodium 134 - 144 mmol/L 141 140 138  °Potassium 3.5 - 5.2 mmol/L 4.5 4.4 4.4  °Chloride 96 - 106 mmol/L 104 106 102  °CO2 20 - 29 mmol/L 24 20 27  °Calcium 8.7 - 10.2 mg/dL 9.5 9.5 9.2  °Total Protein 6.0 - 8.5 g/dL - 7.2 7.6  °Total Bilirubin 0.0 - 1.2 mg/dL - 0.6 0.8   °Alkaline Phos 44 - 121 IU/L - 69 64  °AST 0 - 40 IU/L - 23 20  °ALT 0 - 32 IU/L - 29 28  °  °Hepatic Function Latest Ref Rng & Units 04/14/2021 02/26/2021 10/30/2020  °Total Protein 6.0 - 8.5 g/dL 7.2 7.6 6.8  °Albumin 3.9 - 5.0 g/dL 4.3 4.1 4.1  °AST 0 - 40 IU/L 23 20 20  °ALT 0 - 32 IU/L 29 28 24  °Alk Phosphatase 44 - 121 IU/L 69 64 73  °Total Bilirubin 0.0 - 1.2 mg/dL 0.6 0.8 0.3  °  °Recent lab data reviewed. ° °Assessment: ° °#1.  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  Biochemical markers for scleroderma were negative.  It remains to be seen if she would evolve into achalasia. ° °#2.  GERD.  Heartburn well controlled with low-dose of esomeprazole. ° °#3.  Right upper quadrant abdominal pain.  This pain is associated with nausea but no vomiting.  She is status postcholecystectomy.  LFTs back in October 2022 were normal.  He may have an IBS.  If pain does not respond would consider LFTs and abdominal ultrasound. ° ° °Plan: ° °Dicyclomine 10 mg by mouth 30 minutes before each meal. °Patient will call with progress report in 2 weeks or if she has another episode of moderate to severe pain in which case we will proceed with abdominal ultrasound and LFTs. °Office visit on as-needed basis. ° ° ° ° ° °

## 2021-07-08 NOTE — Patient Instructions (Signed)
Notify if dicyclomine does not help.

## 2021-07-08 NOTE — Telephone Encounter (Signed)
Pt aware by phone 

## 2021-07-15 ENCOUNTER — Telehealth: Payer: Self-pay | Admitting: Family Medicine

## 2021-07-15 NOTE — Telephone Encounter (Signed)
Left detailed message advising of provider feedback and to call back.

## 2021-07-15 NOTE — Telephone Encounter (Signed)
Pt says that she does not want to take sertraline (ZOLOFT) 25 MG tablet. It makes her shakey nervous and sick on her stomach. She took it for two days and on the third days she felt fine. Pt says she will be at work and may not be able to answer. She is ok with a detail message if nurse or Rakes calls back.

## 2021-07-16 ENCOUNTER — Encounter (INDEPENDENT_AMBULATORY_CARE_PROVIDER_SITE_OTHER): Payer: Self-pay | Admitting: Internal Medicine

## 2021-07-17 ENCOUNTER — Ambulatory Visit: Payer: Medicaid Other | Admitting: Family Medicine

## 2021-07-17 ENCOUNTER — Other Ambulatory Visit (INDEPENDENT_AMBULATORY_CARE_PROVIDER_SITE_OTHER): Payer: Self-pay | Admitting: *Deleted

## 2021-07-17 ENCOUNTER — Encounter: Payer: Self-pay | Admitting: Family Medicine

## 2021-07-17 VITALS — BP 141/70 | HR 68 | Temp 98.1°F | Ht 61.0 in | Wt 230.6 lb

## 2021-07-17 DIAGNOSIS — F321 Major depressive disorder, single episode, moderate: Secondary | ICD-10-CM

## 2021-07-17 DIAGNOSIS — F411 Generalized anxiety disorder: Secondary | ICD-10-CM | POA: Diagnosis not present

## 2021-07-17 MED ORDER — MAGIC MOUTHWASH: ORAL | 0 refills | Status: DC

## 2021-07-17 NOTE — Progress Notes (Signed)
Assessment & Plan:  1-2. GAD (generalized anxiety disorder)/Current moderate 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 episode ([place]) Uncontrolled. GeneSight testing completed today.   Follow up plan: Return in about 2 months (around 09/14/2021) for Depression/Anxiety.  Hendricks Limes, MSN, APRN, FNP-C [facility]  Subjective:   Patient ID: Mckenzie Cooley, female    DOB: January 23, 1991, 31 y.o.   MRN: [id number]  HPI: Mckenzie Cooley is a 31 y.o. female presenting on 07/17/2021 for gene sight  Patient is here to complete GeneSight testing for uncontrolled anxiety and depression. She has failed treatment with Prozac, Lexapro, and Zoloft.    ROS: Negative unless specifically indicated above in HPI.   Relevant past medical history reviewed and updated as indicated.   Allergies and medications reviewed and updated.   Current Outpatient Medications:    acetaminophen (TYLENOL) 500 MG tablet, Take 500 mg by mouth every 6 (six) hours as needed., Disp: , Rfl:    cetirizine HCl (ZYRTEC) 5 MG/5ML SOLN, Take 10 mLs (10 mg total) by mouth daily., Disp: 300 mL, Rfl: 0   dicyclomine (BENTYL) 10 MG capsule, Take 1 capsule (10 mg total) by mouth 3 (three) times daily as needed for spasms., Disp: 60 capsule, Rfl: 1   esomeprazole (NEXIUM) 20 MG capsule, Take 20 mg by mouth daily at 12 noon., Disp: , Rfl:    magic mouthwash SOLN, 5 ml's swish and swallow qid for 7 days, Disp: 140 mL, Rfl: 0   sertraline (ZOLOFT) 25 MG tablet, Take 1 tablet (25 mg total) by mouth daily. (Patient not taking: Reported on 07/08/2021), Disp: 30 tablet, Rfl: 3  Allergies  Allergen Reactions   Fluoxetine Other (See Comments)    Stomach ache,headaches, increased appetite.   Percocet [Oxycodone-Acetaminophen] Hives and Itching   Prilosec [Omeprazole] Other (See Comments)    Stomach cramp   Amoxicillin Rash    Has patient had a PCN reaction causing immediate rash, facial/tongue/throat swelling,  SOB or lightheadedness with hypotension: Yes -mild rash Has patient had a PCN reaction causing severe rash involving mucus membranes or skin necrosis: No Has patient had a PCN reaction that required hospitalization: No Has patient had a PCN reaction occurring within the last 10 years: Yes If all of the above answers are "NO", then may proceed with Cephalosporin use. Has tolerated ceftriaxone & cephalexin     Objective:   BP (!) 141/70    Pulse 68    Temp 98.1 F (36.7 C) (Temporal)    Ht 5\' 1"  (1.549 m)    Wt 230 lb 9.6 oz (104.6 kg)    LMP 06/17/2021 (Exact Date)    BMI 43.57 kg/m    Physical Exam Vitals reviewed.  Constitutional:      General: She is not in acute distress.    Appearance: Normal appearance. She is not ill-appearing, toxic-appearing or diaphoretic.  HENT:     Head: Normocephalic and atraumatic.  Eyes:     General: No scleral icterus.       Right eye: No discharge.        Left eye: No discharge.     Conjunctiva/sclera: Conjunctivae normal.  Cardiovascular:     Rate and Rhythm: Normal rate.  Pulmonary:     Effort: Pulmonary effort is normal. No respiratory distress.  Musculoskeletal:        General: Normal range of motion.     Cervical back: Normal range of motion.  Skin:    General: Skin is warm and  dry.     Capillary Refill: Capillary refill takes less than 2 seconds.  Neurological:     General: No focal deficit present.     Mental Status: She is alert and oriented to person, place, and time. Mental status is at baseline.  Psychiatric:        Mood and Affect: Mood normal.        Behavior: Behavior normal.        Thought Content: Thought content normal.        Judgment: Judgment normal.

## 2021-07-17 NOTE — Telephone Encounter (Signed)
Patient returned call and I tried to call her back and got voicemail. Will try again

## 2021-07-17 NOTE — Telephone Encounter (Signed)
Spoke with patient, she is already scheduled for an appointment this afternoon at 1:50 with Britney.

## 2021-07-17 NOTE — Telephone Encounter (Signed)
Left message to return call 

## 2021-07-29 DIAGNOSIS — K219 Gastro-esophageal reflux disease without esophagitis: Secondary | ICD-10-CM | POA: Diagnosis not present

## 2021-07-29 DIAGNOSIS — R1319 Other dysphagia: Secondary | ICD-10-CM | POA: Diagnosis not present

## 2021-08-13 ENCOUNTER — Telehealth: Payer: Self-pay | Admitting: Family Medicine

## 2021-08-13 NOTE — Telephone Encounter (Signed)
Pt called stating that she has been waiting on Hendricks Limes to receive/review her genesight test results and call her with the results.  Pt says she received her results in the mail today. Wants to speak with Britney to go over the results and what meds she recommends/does not recommend.

## 2021-08-14 NOTE — Telephone Encounter (Signed)
I have an appointment with her 03/07 to discuss results and starting medications.

## 2021-08-14 NOTE — Telephone Encounter (Signed)
Patient aware.

## 2021-08-14 NOTE — Telephone Encounter (Signed)
Are you starting her one med since Jonelle Sidle is out?

## 2021-08-14 NOTE — Telephone Encounter (Signed)
Please make patient aware of this.

## 2021-08-14 NOTE — Telephone Encounter (Signed)
Were you going to start her on something or did you want me to? I think I put her results on your desk when they came in.

## 2021-08-19 ENCOUNTER — Encounter: Payer: Self-pay | Admitting: Family Medicine

## 2021-08-19 ENCOUNTER — Ambulatory Visit: Payer: Medicaid Other | Admitting: Family Medicine

## 2021-08-19 VITALS — BP 117/75 | HR 62 | Temp 99.1°F | Resp 20 | Ht 61.0 in | Wt 228.0 lb

## 2021-08-19 DIAGNOSIS — M5442 Lumbago with sciatica, left side: Secondary | ICD-10-CM | POA: Diagnosis not present

## 2021-08-19 MED ORDER — NAPROXEN 500 MG PO TABS
500.0000 mg | ORAL_TABLET | Freq: Two times a day (BID) | ORAL | 0 refills | Status: DC
Start: 2021-08-19 — End: 2021-08-26

## 2021-08-19 MED ORDER — METHYLPREDNISOLONE ACETATE 40 MG/ML IJ SUSP
60.0000 mg | Freq: Once | INTRAMUSCULAR | Status: AC
  Administered 2021-08-19: 60 mg via INTRAMUSCULAR

## 2021-08-19 MED ORDER — KETOROLAC TROMETHAMINE 30 MG/ML IJ SOLN
30.0000 mg | Freq: Once | INTRAMUSCULAR | Status: AC
  Administered 2021-08-19: 30 mg via INTRAMUSCULAR

## 2021-08-19 NOTE — Progress Notes (Signed)
Subjective:  Patient ID: Mckenzie Cooley, female    DOB: September 21, 1990, 31 y.o.   MRN: [id number]  Patient Care Team: Gwenlyn Perking, FNP as PCP - General (Family Medicine)   Chief Complaint:  Back Pain (Low back and radiating down to left hip)   HPI: Mckenzie Cooley is a 31 y.o. female presenting on 08/19/2021 for Back Pain (Low back and radiating down to left hip)   Pt presents today with complaints of lower back pain that radiates into her left leg. States she recently started a new job at [employer] and has to stand on concrete floors daily.   Back Pain This is a new problem. The current episode started in the past 7 days. The problem occurs daily. The problem has been waxing and waning since onset. The pain is present in the lumbar spine, gluteal and sacro-iliac. The quality of the pain is described as aching, burning and shooting. The pain radiates to the left thigh. The pain is at a severity of 5/10. The pain is moderate. The symptoms are aggravated by twisting, standing, sitting, position and bending. Associated symptoms include leg pain and tingling. Pertinent negatives include no abdominal pain, bladder incontinence, bowel incontinence, chest pain, dysuria, fever, headaches, numbness, paresis, paresthesias, pelvic pain, perianal numbness, weakness or weight loss. She has tried heat, ice, muscle relaxant and walking for the symptoms. The treatment provided mild relief.    Relevant past medical, surgical, family, and social history reviewed and updated as indicated.  Allergies and medications reviewed and updated. Data reviewed: Chart in Epic.   Past Medical History:  Diagnosis Date   Acid reflux    Anemia    Bloating    Childhood asthma    Esophageal dysmotility    History of blood transfusion    PCOS (polycystic ovarian syndrome) 09/27/2014   Preeclampsia    Seasonal allergies     Past Surgical History:  Procedure Laterality Date   BIOPSY  02/26/2021   Procedure:  BIOPSY;  Surgeon: Rogene Houston, MD;  [facility];  Service: Endoscopy;;  Esophageal biopsy   carpal tunnel Left 01/30/2021   CARPAL TUNNEL RELEASE Right 03/27/2021   CESAREAN SECTION N/A 01/02/2017   Procedure: CESAREAN SECTION;  Surgeon: Chancy Milroy, MD;  Location: [facility];  Service: Obstetrics;  Laterality: N/A;   CHOLECYSTECTOMY  2011   Dr.Byerly   ESOPHAGEAL DILATION N/A 02/26/2021   Procedure: ESOPHAGEAL DILATION;  Surgeon: Rogene Houston, MD;  [facility];  Service: Endoscopy;  Laterality: N/A;   ESOPHAGOGASTRODUODENOSCOPY N/A 10/26/2014   Procedure: ESOPHAGOGASTRODUODENOSCOPY (EGD);  Surgeon: Rogene Houston, MD;  [facility];  Service: Endoscopy;  Laterality: N/A;   ESOPHAGOGASTRODUODENOSCOPY (EGD) WITH PROPOFOL N/A 02/26/2021   Procedure: ESOPHAGOGASTRODUODENOSCOPY (EGD) WITH PROPOFOL;  Surgeon: Rogene Houston, MD;  [facility];  Service: Endoscopy;  Laterality: N/A;   POLYPECTOMY  02/26/2021   Procedure: POLYPECTOMY;  Surgeon: Rogene Houston, MD;  [facility];  Service: Endoscopy;;  Gastric Body Polyps x 3    UPPER GASTROINTESTINAL ENDOSCOPY  12/05/2009   UPPER GASTROINTESTINAL ENDOSCOPY  02/07/2009   WISDOM TOOTH EXTRACTION     WISDOM TOOTH EXTRACTION      Social History   Socioeconomic History   Marital status: Married    Spouse name: Not on file   Number of children: 1   Years of education: Not on file   Highest education level: Not  on file  Occupational History   Not on file  Tobacco Use   Smoking status: Former    Packs/day: 0.50    Years: 12.00    Pack years: 6.00    Types: Cigarettes    Quit date: 05/05/2016    Years since quitting: 5.2   Smokeless tobacco: Never  Vaping Use   Vaping Use: Never used  Substance and Sexual Activity   Alcohol use: No    Alcohol/week: 0.0 standard drinks   Drug use: No   Sexual activity: Yes    Birth control/protection: Condom  Other  Topics Concern   Not on file  Social History Narrative   Not on file   Social Determinants of Health   Financial Resource Strain: Low Risk    Difficulty of Paying Living Expenses: Not hard at all  Food Insecurity: No Food Insecurity   Worried About Charity fundraiser in the Last Year: Never true   Gans in the Last Year: Never true  Transportation Needs: No Transportation Needs   Lack of Transportation (Medical): No   Lack of Transportation (Non-Medical): No  Physical Activity: Inactive   Days of Exercise per Week: 0 days   Minutes of Exercise per Session: 0 min  Stress: No Stress Concern Present   Feeling of Stress : Not at all  Social Connections: Moderately Integrated   Frequency of Communication with Friends and Family: More than three times a week   Frequency of Social Gatherings with Friends and Family: Once a week   Attends Religious Services: 1 to 4 times per year   Active Member of [employer] or Organizations: No   Attends Archivist Meetings: Never   Marital Status: Married  Human resources officer Violence: Not At Risk   Fear of Current or Ex-Partner: No   Emotionally Abused: No   Physically Abused: No   Sexually Abused: No    Outpatient Encounter Medications as of 08/19/2021  Medication Sig   acetaminophen (TYLENOL) 500 MG tablet Take 500 mg by mouth every 6 (six) hours as needed.   cetirizine HCl (ZYRTEC) 5 MG/5ML SOLN Take 10 mLs (10 mg total) by mouth daily.   dicyclomine (BENTYL) 10 MG capsule Take 1 capsule (10 mg total) by mouth 3 (three) times daily as needed for spasms.   esomeprazole (NEXIUM) 20 MG capsule Take 20 mg by mouth daily at 12 noon.   naproxen (NAPROSYN) 500 MG tablet Take 1 tablet (500 mg total) by mouth 2 (two) times daily with a meal for 14 days.   [DISCONTINUED] magic mouthwash SOLN 5 ml's swish and swallow qid for 7 days   [DISCONTINUED] sertraline (ZOLOFT) 25 MG tablet Take 1 tablet (25 mg total) by mouth daily. (Patient not  taking: Reported on 07/08/2021)   [EXPIRED] ketorolac (TORADOL) 30 MG/ML injection 30 mg    [EXPIRED] methylPREDNISolone acetate (DEPO-MEDROL) injection 60 mg    No facility-administered encounter medications on file as of 08/19/2021.    Allergies  Allergen Reactions   Fluoxetine Other (See Comments)    Stomach ache,headaches, increased appetite.   Percocet [Oxycodone-Acetaminophen] Hives and Itching   Prilosec [Omeprazole] Other (See Comments)    Stomach cramp   Amoxicillin Rash    Has patient had a PCN reaction causing immediate rash, facial/tongue/throat swelling, SOB or lightheadedness with hypotension: Yes -mild rash Has patient had a PCN reaction causing severe rash involving mucus membranes or skin necrosis: No Has patient had a PCN reaction that required hospitalization:  No Has patient had a PCN reaction occurring within the last 10 years: Yes If all of the above answers are "NO", then may proceed with Cephalosporin use. Has tolerated ceftriaxone & cephalexin     Review of Systems  Constitutional:  Negative for activity change, appetite change, chills, diaphoresis, fatigue, fever, unexpected weight change and weight loss.  Cardiovascular:  Negative for chest pain, palpitations and leg swelling.  Gastrointestinal:  Negative for abdominal distention, abdominal pain and bowel incontinence.  Genitourinary:  Negative for bladder incontinence, decreased urine volume, difficulty urinating, dysuria and pelvic pain.  Musculoskeletal:  Positive for arthralgias and back pain. Negative for gait problem, joint swelling, myalgias, neck pain and neck stiffness.  Neurological:  Positive for tingling. Negative for dizziness, tremors, seizures, syncope, facial asymmetry, speech difficulty, weakness, light-headedness, numbness, headaches and paresthesias.  All other systems reviewed and are negative.      Objective:  BP 117/75    Pulse 62    Temp 99.1 F (37.3 C) (Temporal)    Resp 20    Ht 5'  1" (1.549 m)    Wt 228 lb (103.4 kg)    SpO2 100%    BMI 43.08 kg/m    Wt Readings from Last 3 Encounters:  08/19/21 228 lb (103.4 kg)  07/17/21 230 lb 9.6 oz (104.6 kg)  07/08/21 230 lb (104.3 kg)    Physical Exam Vitals and nursing note reviewed.  Constitutional:      General: She is not in acute distress.    Appearance: Normal appearance. She is well-developed and well-groomed. She is obese. She is not ill-appearing, toxic-appearing or diaphoretic.  HENT:     Head: Normocephalic and atraumatic.     Jaw: There is normal jaw occlusion.     Right Ear: Hearing normal.     Left Ear: Hearing normal.     Mouth/Throat:     Lips: Pink.     Mouth: Mucous membranes are moist.     Pharynx: Uvula midline.  Eyes:     General: Lids are normal.     Conjunctiva/sclera: Conjunctivae normal.     Pupils: Pupils are equal, round, and reactive to light.  Neck:     Thyroid: No thyroid mass, thyromegaly or thyroid tenderness.     Vascular: No carotid bruit or JVD.     Trachea: Trachea and phonation normal.  Cardiovascular:     Rate and Rhythm: Normal rate and regular rhythm.     Chest Wall: PMI is not displaced.     Pulses: Normal pulses.     Heart sounds: Normal heart sounds. No murmur heard.   No friction rub. No gallop.  Pulmonary:     Effort: Pulmonary effort is normal. No respiratory distress.     Breath sounds: Normal breath sounds. No wheezing.  Abdominal:     General: Bowel sounds are normal. There is no distension or abdominal bruit.     Palpations: Abdomen is soft. There is no hepatomegaly or splenomegaly.     Tenderness: There is no abdominal tenderness. There is no right CVA tenderness or left CVA tenderness.     Hernia: No hernia is present.  Musculoskeletal:     Cervical back: Normal range of motion and neck supple.     Thoracic back: Normal.     Lumbar back: Tenderness present. No swelling, edema, deformity, signs of trauma, lacerations, spasms or bony tenderness. Decreased  range of motion. Positive left straight leg raise test. Negative right straight leg raise test.  No scoliosis.     Right hip: Normal.     Left hip: Normal.     Right lower leg: No edema.     Left lower leg: No edema.  Lymphadenopathy:     Cervical: No cervical adenopathy.  Skin:    General: Skin is warm and dry.     Capillary Refill: Capillary refill takes less than 2 seconds.     Coloration: Skin is not cyanotic, jaundiced or pale.     Findings: No rash.  Neurological:     General: No focal deficit present.     Mental Status: She is alert and oriented to person, place, and time.     Cranial Nerves: No cranial nerve deficit.     Sensory: Sensation is intact. No sensory deficit.     Motor: Motor function is intact. No weakness.     Coordination: Coordination is intact. Coordination normal.     Gait: Gait is intact. Gait normal.     Deep Tendon Reflexes: Reflexes are normal and symmetric. Reflexes normal.  Psychiatric:        Attention and Perception: Attention and perception normal.        Mood and Affect: Mood and affect normal.        Speech: Speech normal.        Behavior: Behavior normal. Behavior is cooperative.        Thought Content: Thought content normal.        Cognition and Memory: Cognition and memory normal.        Judgment: Judgment normal.    Results for orders placed or performed in visit on 06/27/21  CULTURE, URINE COMPREHENSIVE   Specimen: Urine   UR  Result Value Ref Range   Urine Culture, Comprehensive Final report (A)    Organism ID, Bacteria Escherichia coli (A)    ANTIMICROBIAL SUSCEPTIBILITY Comment   Microscopic Examination   Urine  Result Value Ref Range   WBC, UA 0-5 0 - 5 /hpf   RBC 0-2 0 - 2 /hpf   Epithelial Cells (non renal) 0-10 0 - 10 /hpf   Renal Epithel, UA None seen None seen /hpf   Bacteria, UA Few (A) None seen/Few  Urinalysis, Routine w reflex microscopic  Result Value Ref Range   Specific Gravity, UA >1.030 (H) 1.005 - 1.030    pH, UA 5.5 5.0 - 7.5   Color, UA Yellow Yellow   Appearance Ur Clear Clear   Leukocytes,UA Negative Negative   Protein,UA Negative Negative/Trace   Glucose, UA Negative Negative   Ketones, UA Negative Negative   RBC, UA 2+ (A) Negative   Bilirubin, UA Negative Negative   Urobilinogen, Ur 0.2 0.2 - 1.0 mg/dL   Nitrite, UA Negative Negative   Microscopic Examination See below:        Pertinent labs & imaging results that were available during my care of the patient were reviewed by me and considered in my medical decision making.  Assessment & Plan:  Aura was seen today for back pain.  Diagnoses and all orders for this visit:  Acute midline low back pain with left-sided sciatica No red flags concerning for Cauda Equina Syndrome. Stretching exercises discussed and handout provided. Toradol and depo-medrol given in office. Naproxen twice daily for 14 days. Reevaluation in 6 weeks. Report any new, worsening, or persistent symptoms.  -     naproxen (NAPROSYN) 500 MG tablet; Take 1 tablet (500 mg total) by mouth 2 (two) times daily with a  meal for 14 days. -     methylPREDNISolone acetate (DEPO-MEDROL) injection 60 mg -     ketorolac (TORADOL) 30 MG/ML injection 30 mg     Continue all other maintenance medications.  Follow up plan: Return in about 6 weeks (around 09/30/2021), or if symptoms worsen or fail to improve, for low back pain.   Continue healthy lifestyle choices, including diet (rich in fruits, vegetables, and lean proteins, and low in salt and simple carbohydrates) and exercise (at least 30 minutes of moderate physical activity daily).  Educational handout given for sciatica  The above assessment and management plan was discussed with the patient. The patient verbalized understanding of and has agreed to the management plan. Patient is aware to call the clinic if they develop any new symptoms or if symptoms persist or worsen. Patient is aware when to return to the clinic  for a follow-up visit. Patient educated on when it is appropriate to go to the emergency department.   Monia Pouch, FNP-C [facility] [phone]

## 2021-08-26 ENCOUNTER — Encounter: Payer: Self-pay | Admitting: Family Medicine

## 2021-08-26 ENCOUNTER — Ambulatory Visit: Payer: Medicaid Other | Admitting: Family Medicine

## 2021-08-26 VITALS — BP 103/64 | HR 58 | Temp 98.2°F | Ht 61.0 in | Wt 225.0 lb

## 2021-08-26 DIAGNOSIS — R110 Nausea: Secondary | ICD-10-CM

## 2021-08-26 DIAGNOSIS — F411 Generalized anxiety disorder: Secondary | ICD-10-CM

## 2021-08-26 DIAGNOSIS — F321 Major depressive disorder, single episode, moderate: Secondary | ICD-10-CM

## 2021-08-26 MED ORDER — ONDANSETRON HCL 4 MG PO TABS: 4.0000 mg | ORAL_TABLET | Freq: Three times a day (TID) | ORAL | 0 refills | Status: DC | PRN

## 2021-08-26 MED ORDER — FLUOXETINE HCL 10 MG PO CAPS: 10.0000 mg | ORAL_CAPSULE | Freq: Every day | ORAL | 3 refills | Status: DC

## 2021-08-26 NOTE — Progress Notes (Signed)
Subjective:  Patient ID: Mckenzie Cooley, female    DOB: 1990/11/24, 32 y.o.   MRN: [id number]  Patient Care Team: Gwenlyn Perking, FNP as PCP - General (Family Medicine)   Chief Complaint:  Medical Management of Chronic Issues   HPI: Mckenzie Cooley is a 31 y.o. female presenting on 08/26/2021 for Medical Management of Chronic Issues  Pt presents today for anxiety and depression follow up. She was started on fluoxetine which caused nausea, she stopped this and started zoloft which also caused nausea, so she stopped it as well. She has since had genesight testing. Results were reviewed in detail today. Pt agrees to trail fluoxetine again. Will provide with PRN zofran until pt adjusts to medications.   GAD 7 : Generalized Anxiety Score 08/26/2021 08/19/2021 07/17/2021 07/01/2021  Nervous, Anxious, on Edge '3 1 3 '$ 0  Control/stop worrying 0 1 3 0  Worry too much - different things '3 1 3 '$ 0  Trouble relaxing '3 1 2 '$ 0  Restless 1 0 2 0  Easily annoyed or irritable '3 3 3 '$ 0  Afraid - awful might happen 0 0 3 0  Total GAD 7 Score '13 7 19 '$ 0  Anxiety Difficulty Very difficult Somewhat difficult Very difficult Not difficult at all   Depression screen [facility] 2/9 08/26/2021 08/19/2021 07/17/2021 07/01/2021 06/27/2021  Decreased Interest 0 0 0 0 0  Down, Depressed, Hopeless 0 0 0 0 0  PHQ - 2 Score 0 0 0 0 0  Altered sleeping '2 3 3 '$ 0 0  Tired, decreased energy '1 3 3 '$ 0 0  Change in appetite 1 0 3 0 0  Feeling bad or failure about yourself  0 0 0 0 0  Trouble concentrating 0 0 3 0 0  Moving slowly or fidgety/restless 0 0 3 0 0  Suicidal thoughts 0 0 0 0 0  PHQ-9 Score '4 6 15 '$ 0 0  Difficult doing work/chores Not difficult at all Somewhat difficult Very difficult Not difficult at all Not difficult at all  Some recent data might be hidden     Relevant past medical, surgical, family, and social history reviewed and updated as indicated.  Allergies and medications reviewed and updated. Data reviewed: Chart in  Epic.   Past Medical History:  Diagnosis Date   Acid reflux    Anemia    Bloating    Childhood asthma    Esophageal dysmotility    History of blood transfusion    PCOS (polycystic ovarian syndrome) 09/27/2014   Preeclampsia    Seasonal allergies     Past Surgical History:  Procedure Laterality Date   BIOPSY  02/26/2021   Procedure: BIOPSY;  Surgeon: Rogene Houston, MD;  [facility];  Service: Endoscopy;;  Esophageal biopsy   carpal tunnel Left 01/30/2021   CARPAL TUNNEL RELEASE Right 03/27/2021   CESAREAN SECTION N/A 01/02/2017   Procedure: CESAREAN SECTION;  Surgeon: Chancy Milroy, MD;  Location: [place];  Service: Obstetrics;  Laterality: N/A;   CHOLECYSTECTOMY  2011   Dr.Byerly   ESOPHAGEAL DILATION N/A 02/26/2021   Procedure: ESOPHAGEAL DILATION;  Surgeon: Rogene Houston, MD;  [facility];  Service: Endoscopy;  Laterality: N/A;   ESOPHAGOGASTRODUODENOSCOPY N/A 10/26/2014   Procedure: ESOPHAGOGASTRODUODENOSCOPY (EGD);  Surgeon: Rogene Houston, MD;  [facility];  Service: Endoscopy;  Laterality: N/A;   ESOPHAGOGASTRODUODENOSCOPY (EGD) WITH PROPOFOL N/A 02/26/2021   Procedure: ESOPHAGOGASTRODUODENOSCOPY (EGD) WITH PROPOFOL;  Surgeon:  Rogene Houston, MD;  [facility];  Service: Endoscopy;  Laterality: N/A;   POLYPECTOMY  02/26/2021   Procedure: POLYPECTOMY;  Surgeon: Rogene Houston, MD;  [facility];  Service: Endoscopy;;  Gastric Body Polyps x 3    UPPER GASTROINTESTINAL ENDOSCOPY  12/05/2009   UPPER GASTROINTESTINAL ENDOSCOPY  02/07/2009   WISDOM TOOTH EXTRACTION     WISDOM TOOTH EXTRACTION      Social History   Socioeconomic History   Marital status: Married    Spouse name: Not on file   Number of children: 1   Years of education: Not on file   Highest education level: Not on file  Occupational History   Not on file  Tobacco Use   Smoking status: Former    Packs/day: 0.50     Years: 12.00    Pack years: 6.00    Types: Cigarettes    Quit date: 05/05/2016    Years since quitting: 5.3   Smokeless tobacco: Never  Vaping Use   Vaping Use: Never used  Substance and Sexual Activity   Alcohol use: No    Alcohol/week: 0.0 standard drinks   Drug use: No   Sexual activity: Yes    Birth control/protection: Condom  Other Topics Concern   Not on file  Social History Narrative   Not on file   Social Determinants of Health   Financial Resource Strain: Low Risk    Difficulty of Paying Living Expenses: Not hard at all  Food Insecurity: No Food Insecurity   Worried About Charity fundraiser in the Last Year: Never true   [place] in the Last Year: Never true  Transportation Needs: No Transportation Needs   Lack of Transportation (Medical): No   Lack of Transportation (Non-Medical): No  Physical Activity: Inactive   Days of Exercise per Week: 0 days   Minutes of Exercise per Session: 0 min  Stress: No Stress Concern Present   Feeling of Stress : Not at all  Social Connections: Moderately Integrated   Frequency of Communication with Friends and Family: More than three times a week   Frequency of Social Gatherings with Friends and Family: Once a week   Attends Religious Services: 1 to 4 times per year   Active Member of [employer] or Organizations: No   Attends Archivist Meetings: Never   Marital Status: Married  Human resources officer Violence: Not At Risk   Fear of Current or Ex-Partner: No   Emotionally Abused: No   Physically Abused: No   Sexually Abused: No    Outpatient Encounter Medications as of 08/26/2021  Medication Sig   acetaminophen (TYLENOL) 500 MG tablet Take 500 mg by mouth every 6 (six) hours as needed.   cetirizine HCl (ZYRTEC) 5 MG/5ML SOLN Take 10 mLs (10 mg total) by mouth daily.   dicyclomine (BENTYL) 10 MG capsule Take 10 mg by mouth 4 (four) times daily -  before meals and at bedtime.   esomeprazole (NEXIUM) 20 MG capsule Take  20 mg by mouth daily at 12 noon.   FLUoxetine (PROZAC) 10 MG capsule Take 1 capsule (10 mg total) by mouth daily.   ondansetron (ZOFRAN) 4 MG tablet Take 1 tablet (4 mg total) by mouth every 8 (eight) hours as needed for nausea or vomiting.   [DISCONTINUED] dicyclomine (BENTYL) 10 MG capsule Take 1 capsule (10 mg total) by mouth 3 (three) times daily as needed for spasms.   [DISCONTINUED] naproxen (NAPROSYN)  500 MG tablet Take 1 tablet (500 mg total) by mouth 2 (two) times daily with a meal for 14 days.   No facility-administered encounter medications on file as of 08/26/2021.    Allergies  Allergen Reactions   Percocet [Oxycodone-Acetaminophen] Hives and Itching   Prilosec [Omeprazole] Other (See Comments)    Stomach cramp   Amoxicillin Rash    Has patient had a PCN reaction causing immediate rash, facial/tongue/throat swelling, SOB or lightheadedness with hypotension: Yes -mild rash Has patient had a PCN reaction causing severe rash involving mucus membranes or skin necrosis: No Has patient had a PCN reaction that required hospitalization: No Has patient had a PCN reaction occurring within the last 10 years: Yes If all of the above answers are "NO", then may proceed with Cephalosporin use. Has tolerated ceftriaxone & cephalexin     Review of Systems  Constitutional:  Positive for activity change, appetite change and fatigue. Negative for chills, diaphoresis, fever and unexpected weight change.  HENT: Negative.    Eyes: Negative.   Respiratory:  Negative for cough, chest tightness and shortness of breath.   Cardiovascular:  Negative for chest pain, palpitations and leg swelling.  Gastrointestinal:  Negative for abdominal pain, blood in stool, constipation, diarrhea, nausea and vomiting.  Endocrine: Negative.   Genitourinary:  Negative for decreased urine volume, difficulty urinating, dysuria, frequency and urgency.  Musculoskeletal:  Negative for arthralgias and myalgias.  Skin:  Negative.   Allergic/Immunologic: Negative.   Neurological:  Negative for dizziness and headaches.  Hematological: Negative.   Psychiatric/Behavioral:  Positive for agitation and sleep disturbance. Negative for behavioral problems, confusion, decreased concentration, dysphoric mood, hallucinations, self-injury and suicidal ideas. The patient is nervous/anxious. The patient is not hyperactive.   All other systems reviewed and are negative.      Objective:  BP 103/64    Pulse (!) 58    Temp 98.2 F (36.8 C)    Ht '5\' 1"'$  (1.549 m)    Wt 225 lb (102.1 kg)    LMP 06/17/2021 (Exact Date)    SpO2 98%    BMI 42.51 kg/m    Wt Readings from Last 3 Encounters:  08/26/21 225 lb (102.1 kg)  08/19/21 228 lb (103.4 kg)  07/17/21 230 lb 9.6 oz (104.6 kg)    Physical Exam Vitals and nursing note reviewed.  Constitutional:      General: She is not in acute distress.    Appearance: Normal appearance. She is obese. She is not ill-appearing, toxic-appearing or diaphoretic.  HENT:     Head: Normocephalic and atraumatic.     Mouth/Throat:     Mouth: Mucous membranes are moist.  Eyes:     Pupils: Pupils are equal, round, and reactive to light.  Cardiovascular:     Rate and Rhythm: Normal rate and regular rhythm.     Heart sounds: Normal heart sounds. No murmur heard.   No friction rub. No gallop.  Pulmonary:     Effort: Pulmonary effort is normal.     Breath sounds: Normal breath sounds.  Abdominal:     General: Bowel sounds are normal. There is no distension.     Palpations: Abdomen is soft. There is no mass.     Tenderness: There is no abdominal tenderness. There is no right CVA tenderness, left CVA tenderness, guarding or rebound.     Hernia: No hernia is present.  Skin:    General: Skin is warm and dry.     Capillary Refill: Capillary refill  takes less than 2 seconds.  Neurological:     General: No focal deficit present.     Mental Status: She is alert and oriented to person, place, and  time.  Psychiatric:        Mood and Affect: Mood normal.        Behavior: Behavior normal.        Thought Content: Thought content normal.        Judgment: Judgment normal.    Results for orders placed or performed in visit on 06/27/21  CULTURE, URINE COMPREHENSIVE   Specimen: Urine   UR  Result Value Ref Range   Urine Culture, Comprehensive Final report (A)    Organism ID, Bacteria Escherichia coli (A)    ANTIMICROBIAL SUSCEPTIBILITY Comment   Microscopic Examination   Urine  Result Value Ref Range   WBC, UA 0-5 0 - 5 /hpf   RBC 0-2 0 - 2 /hpf   Epithelial Cells (non renal) 0-10 0 - 10 /hpf   Renal Epithel, UA None seen None seen /hpf   Bacteria, UA Few (A) None seen/Few  Urinalysis, Routine w reflex microscopic  Result Value Ref Range   Specific Gravity, UA >1.030 (H) 1.005 - 1.030   pH, UA 5.5 5.0 - 7.5   Color, UA Yellow Yellow   Appearance Ur Clear Clear   Leukocytes,UA Negative Negative   Protein,UA Negative Negative/Trace   Glucose, UA Negative Negative   Ketones, UA Negative Negative   RBC, UA 2+ (A) Negative   Bilirubin, UA Negative Negative   Urobilinogen, Ur 0.2 0.2 - 1.0 mg/dL   Nitrite, UA Negative Negative   Microscopic Examination See below:        Pertinent labs & imaging results that were available during my care of the patient were reviewed by me and considered in my medical decision making.  Assessment & Plan:  Daviana was seen today for medical management of chronic issues.  Diagnoses and all orders for this visit:  GAD (generalized anxiety disorder) Current moderate 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 episode ([place]) Genesight testing results were reviewed in detail today. Pt agrees to trail fluoxetine again. Will provide with PRN zofran until pt adjusts to medications. Pt aware to report any new, worsening, or persistent symptoms. Follow up in 4-6 weeks or sooner if warranted.  -     FLUoxetine (PROZAC) 10 MG capsule; Take 1 capsule  (10 mg total) by mouth daily.  Nausea in adult -     ondansetron (ZOFRAN) 4 MG tablet; Take 1 tablet (4 mg total) by mouth every 8 (eight) hours as needed for nausea or vomiting.     Continue all other maintenance medications.  Follow up plan: Return in about 6 weeks (around 10/07/2021), or if symptoms worsen or fail to improve, for GAD, depression.   Continue healthy lifestyle choices, including diet (rich in fruits, vegetables, and lean proteins, and low in salt and simple carbohydrates) and exercise (at least 30 minutes of moderate physical activity daily).  Educational handout given for depression  The above assessment and management plan was discussed with the patient. The patient verbalized understanding of and has agreed to the management plan. Patient is aware to call the clinic if they develop any new symptoms or if symptoms persist or worsen. Patient is aware when to return to the clinic for a follow-up visit. Patient educated on when it is appropriate to go to the emergency department.   Monia Pouch, FNP-C [facility] [phone]

## 2021-08-26 NOTE — Patient Instructions (Signed)

## 2021-08-28 DIAGNOSIS — R1319 Other dysphagia: Secondary | ICD-10-CM | POA: Diagnosis not present

## 2021-09-03 ENCOUNTER — Encounter: Payer: Self-pay | Admitting: Family Medicine

## 2021-09-03 ENCOUNTER — Ambulatory Visit: Payer: Medicaid Other | Admitting: Family Medicine

## 2021-09-03 DIAGNOSIS — J101 Influenza due to other identified influenza virus with other respiratory manifestations: Secondary | ICD-10-CM | POA: Diagnosis not present

## 2021-09-03 DIAGNOSIS — J069 Acute upper respiratory infection, unspecified: Secondary | ICD-10-CM | POA: Diagnosis not present

## 2021-09-03 NOTE — Progress Notes (Signed)
? ?Virtual Visit via telephone Note ? ?I connected with Mckenzie Cooley on 09/03/21 at 1800 by telephone and verified that I am speaking with the correct person using two identifiers. Mckenzie Cooley is currently located at home and patient are currently with her during visit. The provider, Fransisca Kaufmann Rudie Rikard, MD is located in their office at time of visit. ? ?Call ended at 1805 ? ?I discussed the limitations, risks, security and privacy concerns of performing an evaluation and management service by telephone and the availability of in person appointments. I also discussed with the patient that there may be a patient responsible charge related to this service. The patient expressed understanding and agreed to proceed. ? ? ?History and Present Illness: ?Patient is calling inf for cough and congestion and green phlegm and runny nose and headache and body aches and fevers.  Her temp was 101.2.  her symptoms started yesterday and husband has similar symptoms.  She has productive cough. She denies SOB but does have wheezing. She is not flu or covid vaccinated.  She is taking mucinex and tylenol cold.   ? ?1. Upper respiratory tract infection, unspecified type   ? ? ?Outpatient Encounter Medications as of 09/03/2021  ?Medication Sig  ? acetaminophen (TYLENOL) 500 MG tablet Take 500 mg by mouth every 6 (six) hours as needed.  ? cetirizine HCl (ZYRTEC) 5 MG/5ML SOLN Take 10 mLs (10 mg total) by mouth daily.  ? dicyclomine (BENTYL) 10 MG capsule Take 10 mg by mouth 4 (four) times daily -  before meals and at bedtime.  ? esomeprazole (NEXIUM) 20 MG capsule Take 20 mg by mouth daily at 12 noon.  ? FLUoxetine (PROZAC) 10 MG capsule Take 1 capsule (10 mg total) by mouth daily.  ? ondansetron (ZOFRAN) 4 MG tablet Take 1 tablet (4 mg total) by mouth every 8 (eight) hours as needed for nausea or vomiting.  ? ?No facility-administered encounter medications on file as of 09/03/2021.  ? ? ?Review of Systems  ?Constitutional:  Positive  for chills and fever.  ?HENT:  Positive for congestion, postnasal drip, rhinorrhea, sinus pressure, sneezing and sore throat. Negative for ear discharge and ear pain.   ?Eyes:  Negative for pain, redness and visual disturbance.  ?Respiratory:  Positive for cough. Negative for chest tightness and shortness of breath.   ?Cardiovascular:  Negative for chest pain and leg swelling.  ?Genitourinary:  Negative for difficulty urinating and dysuria.  ?Musculoskeletal:  Negative for back pain and gait problem.  ?Skin:  Negative for rash.  ?Neurological:  Negative for light-headedness and headaches.  ?Psychiatric/Behavioral:  Negative for agitation and behavioral problems.   ?All other systems reviewed and are negative. ? ?Observations/Objective: ?Patient sounds comfortable and in no acute distress. ? ?Assessment and Plan: ?Problem List Items Addressed This Visit   ? ?  ? Respiratory  ? Upper respiratory infection - Primary  ? Relevant Orders  ? Veritor Flu A/B Waived  ? Novel Coronavirus, NAA ([facility])  ?  ?Will treat and symptomatically and will talk after testing. ?Follow up plan: ?Return if symptoms worsen or fail to improve. ? ? ?  ?I discussed the assessment and treatment plan with the patient. The patient was provided an opportunity to ask questions and all were answered. The patient agreed with the plan and demonstrated an understanding of the instructions. ?  ?The patient was advised to call back or seek an in-person evaluation if the symptoms worsen or if the condition fails to improve  as anticipated. ? ?The above assessment and management plan was discussed with the patient. The patient verbalized understanding of and has agreed to the management plan. Patient is aware to call the clinic if symptoms persist or worsen. Patient is aware when to return to the clinic for a follow-up visit. Patient educated on when it is appropriate to go to the emergency department.  ? ? ?I provided 5 minutes of non-face-to-face time  during this encounter. ? ? ? ?Worthy Rancher, MD ?  ? ?

## 2021-09-04 ENCOUNTER — Encounter: Payer: Self-pay | Admitting: Family Medicine

## 2021-09-04 DIAGNOSIS — J069 Acute upper respiratory infection, unspecified: Secondary | ICD-10-CM | POA: Diagnosis not present

## 2021-09-04 LAB — VERITOR FLU A/B WAIVED
Influenza A: POSITIVE — AB
Influenza B: NEGATIVE

## 2021-09-04 MED ORDER — OSELTAMIVIR PHOSPHATE 75 MG PO CAPS: 75.0000 mg | ORAL_CAPSULE | Freq: Two times a day (BID) | ORAL | 0 refills | Status: AC

## 2021-09-04 NOTE — Addendum Note (Signed)
Addended by: Caryl Pina on: 09/04/2021 12:55 PM ? ? Modules accepted: Orders ? ?

## 2021-09-05 LAB — NOVEL CORONAVIRUS, NAA: SARS-CoV-2, NAA: NOT DETECTED

## 2021-09-15 DIAGNOSIS — K219 Gastro-esophageal reflux disease without esophagitis: Secondary | ICD-10-CM | POA: Diagnosis not present

## 2021-09-15 DIAGNOSIS — R1319 Other dysphagia: Secondary | ICD-10-CM | POA: Diagnosis not present

## 2021-09-15 DIAGNOSIS — K2950 Unspecified chronic gastritis without bleeding: Secondary | ICD-10-CM | POA: Diagnosis not present

## 2021-09-15 DIAGNOSIS — K449 Diaphragmatic hernia without obstruction or gangrene: Secondary | ICD-10-CM | POA: Diagnosis not present

## 2021-09-17 ENCOUNTER — Encounter: Payer: Self-pay | Admitting: Family Medicine

## 2021-09-18 ENCOUNTER — Other Ambulatory Visit: Payer: Self-pay | Admitting: Family Medicine

## 2021-09-18 DIAGNOSIS — R110 Nausea: Secondary | ICD-10-CM

## 2021-09-18 MED ORDER — ONDANSETRON HCL 8 MG PO TABS: 8.0000 mg | ORAL_TABLET | Freq: Three times a day (TID) | ORAL | 0 refills | Status: DC | PRN

## 2021-09-19 ENCOUNTER — Ambulatory Visit: Payer: Medicaid Other | Admitting: Family Medicine

## 2021-09-23 ENCOUNTER — Encounter: Payer: Self-pay | Admitting: Family Medicine

## 2021-10-08 ENCOUNTER — Ambulatory Visit: Payer: Medicaid Other | Admitting: Family Medicine

## 2021-10-23 ENCOUNTER — Other Ambulatory Visit: Payer: Self-pay | Admitting: Family Medicine

## 2021-10-23 ENCOUNTER — Encounter: Payer: Self-pay | Admitting: Family Medicine

## 2021-10-23 DIAGNOSIS — R110 Nausea: Secondary | ICD-10-CM

## 2021-10-23 MED ORDER — ONDANSETRON HCL 8 MG PO TABS: 8.0000 mg | ORAL_TABLET | Freq: Three times a day (TID) | ORAL | 0 refills | Status: DC | PRN

## 2021-10-30 ENCOUNTER — Telehealth: Payer: Self-pay

## 2021-10-30 NOTE — Telephone Encounter (Signed)
Advised patient, insurance denied her pre-athorizaiton due to they only cover BL breast reduction due to trauma, breast disease/cancer or birth defects. Offered  quoted and will send via mychart ?

## 2021-11-12 ENCOUNTER — Encounter (INDEPENDENT_AMBULATORY_CARE_PROVIDER_SITE_OTHER): Payer: Self-pay | Admitting: Internal Medicine

## 2021-11-12 NOTE — Telephone Encounter (Signed)
Will discuss with dr Laural Golden tomorrow when he is in office.

## 2021-11-13 ENCOUNTER — Other Ambulatory Visit (INDEPENDENT_AMBULATORY_CARE_PROVIDER_SITE_OTHER): Payer: Self-pay | Admitting: *Deleted

## 2021-11-13 MED ORDER — DICYCLOMINE HCL 10 MG PO CAPS: 10.0000 mg | ORAL_CAPSULE | Freq: Three times a day (TID) | ORAL | 5 refills | Status: DC

## 2021-11-25 ENCOUNTER — Encounter: Payer: Self-pay | Admitting: Obstetrics & Gynecology

## 2021-11-25 ENCOUNTER — Ambulatory Visit (INDEPENDENT_AMBULATORY_CARE_PROVIDER_SITE_OTHER): Payer: Medicaid Other | Admitting: Obstetrics & Gynecology

## 2021-11-25 VITALS — BP 138/80 | HR 55 | Wt 227.0 lb

## 2021-11-25 DIAGNOSIS — N970 Female infertility associated with anovulation: Secondary | ICD-10-CM

## 2021-11-25 MED ORDER — LETROZOLE 2.5 MG PO TABS
2.5000 mg | ORAL_TABLET | Freq: Every day | ORAL | 6 refills | Status: DC
Start: 2021-11-25 — End: 2022-02-19

## 2021-11-25 NOTE — Progress Notes (Signed)
Chief Complaint  Patient presents with   Infertility      31 y.o. [id number] No LMP recorded. The current method of family planning is none.  Outpatient Encounter Medications as of 11/25/2021  Medication Sig Note   acetaminophen (TYLENOL) 500 MG tablet Take 500 mg by mouth every 6 (six) hours as needed.    dicyclomine (BENTYL) 10 MG capsule Take 1 capsule (10 mg total) by mouth 4 (four) times daily -  before meals and at bedtime.    esomeprazole (NEXIUM) 20 MG capsule Take 20 mg by mouth daily at 12 noon.    letrozole (FEMARA) 2.5 MG tablet Take 1 tablet (2.5 mg total) by mouth daily. For 5 days days 3-7    cetirizine HCl (ZYRTEC) 5 MG/5ML SOLN Take 10 mLs (10 mg total) by mouth daily. (Patient not taking: Reported on 11/25/2021) 07/08/2021: As needed per patient.   FLUoxetine (PROZAC) 10 MG capsule Take 1 capsule (10 mg total) by mouth daily. (Patient not taking: Reported on 11/25/2021)    ondansetron (ZOFRAN) 8 MG tablet Take 1 tablet (8 mg total) by mouth every 8 (eight) hours as needed for nausea or vomiting. (Patient not taking: Reported on 11/25/2021)    No facility-administered encounter medications on file as of 11/25/2021.    Subjective Has not used birth control for several years Having fairly regular periods  Will try femara  Past Medical History:  Diagnosis Date   Acid reflux    Anemia    Bloating    Childhood asthma    Esophageal dysmotility    History of blood transfusion    PCOS (polycystic ovarian syndrome) 09/27/2014   Preeclampsia    Seasonal allergies     Past Surgical History:  Procedure Laterality Date   BIOPSY  02/26/2021   Procedure: BIOPSY;  Surgeon: Rogene Houston, MD;  [facility]: [facility];  Service: Endoscopy;;  Esophageal biopsy   carpal tunnel Left 01/30/2021   CARPAL TUNNEL RELEASE Right 03/27/2021   CESAREAN SECTION N/A 01/02/2017   Procedure: CESAREAN SECTION;  Surgeon: Chancy Milroy, MD;  Location: [place];  Service:  Obstetrics;  Laterality: N/A;   CHOLECYSTECTOMY  2011   Dr.Byerly   ESOPHAGEAL DILATION N/A 02/26/2021   Procedure: ESOPHAGEAL DILATION;  Surgeon: Rogene Houston, MD;  Location: [facility];  Service: Endoscopy;  Laterality: N/A;   ESOPHAGOGASTRODUODENOSCOPY N/A 10/26/2014   Procedure: ESOPHAGOGASTRODUODENOSCOPY (EGD);  Surgeon: Rogene Houston, MD;  Location: [facility];  Service: Endoscopy;  Laterality: N/A;   ESOPHAGOGASTRODUODENOSCOPY (EGD) WITH PROPOFOL N/A 02/26/2021   Procedure: ESOPHAGOGASTRODUODENOSCOPY (EGD) WITH PROPOFOL;  Surgeon: Rogene Houston, MD;  Location: [facility];  Service: Endoscopy;  Laterality: N/A;   POLYPECTOMY  02/26/2021   Procedure: POLYPECTOMY;  Surgeon: Rogene Houston, MD;  Location: [facility];  Service: Endoscopy;;  Gastric Body Polyps x 3    UPPER GASTROINTESTINAL ENDOSCOPY  12/05/2009   UPPER GASTROINTESTINAL ENDOSCOPY  02/07/2009   WISDOM TOOTH EXTRACTION     WISDOM TOOTH EXTRACTION      OB History     Gravida  5   Para  1   Term  1   Preterm      AB  4   Living  1      SAB  4   IAB  0   Ectopic  0   Multiple  0   Live Births  1  Allergies  Allergen Reactions   Percocet [Oxycodone-Acetaminophen] Hives and Itching   Prilosec [Omeprazole] Other (See Comments)    Stomach cramp   Amoxicillin Rash    Has patient had a PCN reaction causing immediate rash, facial/tongue/throat swelling, SOB or lightheadedness with hypotension: Yes -mild rash Has patient had a PCN reaction causing severe rash involving mucus membranes or skin necrosis: No Has patient had a PCN reaction that required hospitalization: No Has patient had a PCN reaction occurring within the last 10 years: Yes If all of the above answers are "NO", then may proceed with Cephalosporin use. Has tolerated ceftriaxone & cephalexin     Social History   Socioeconomic History   Marital status: Married    Spouse name: Not on file   Number of  children: 1   Years of education: Not on file   Highest education level: Not on file  Occupational History   Not on file  Tobacco Use   Smoking status: Former    Packs/day: 0.50    Years: 12.00    Pack years: 6.00    Types: Cigarettes    Quit date: 05/05/2016    Years since quitting: 5.5   Smokeless tobacco: Never  Vaping Use   Vaping Use: Never used  Substance and Sexual Activity   Alcohol use: No    Alcohol/week: 0.0 standard drinks   Drug use: No   Sexual activity: Yes    Birth control/protection: Condom  Other Topics Concern   Not on file  Social History Narrative   Not on file   Social Determinants of Health   Financial Resource Strain: Low Risk    Difficulty of Paying Living Expenses: Not hard at all  Food Insecurity: No Food Insecurity   Worried About Charity fundraiser in the Last Year: Never true   [place] in the Last Year: Never true  Transportation Needs: No Transportation Needs   Lack of Transportation (Medical): No   Lack of Transportation (Non-Medical): No  Physical Activity: Inactive   Days of Exercise per Week: 0 days   Minutes of Exercise per Session: 0 min  Stress: No Stress Concern Present   Feeling of Stress : Not at all  Social Connections: Moderately Integrated   Frequency of Communication with Friends and Family: More than three times a week   Frequency of Social Gatherings with Friends and Family: Once a week   Attends Religious Services: 1 to 4 times per year   Active Member of [employer] or Organizations: No   Attends Music therapist: Never   Marital Status: Married    Family History  Problem Relation Age of Onset   Kidney disease Maternal Grandmother    Renal Disease Maternal Grandmother    Heart disease Father    Hypertension Mother    Cancer - Cervical Maternal Aunt     Medications:       Current Outpatient Medications:    acetaminophen (TYLENOL) 500 MG tablet, Take 500 mg by mouth every 6 (six) hours as  needed., Disp: , Rfl:    dicyclomine (BENTYL) 10 MG capsule, Take 1 capsule (10 mg total) by mouth 4 (four) times daily -  before meals and at bedtime., Disp: 120 capsule, Rfl: 5   esomeprazole (NEXIUM) 20 MG capsule, Take 20 mg by mouth daily at 12 noon., Disp: , Rfl:    letrozole (FEMARA) 2.5 MG tablet, Take 1 tablet (2.5 mg total) by mouth daily. For 5 days days  3-7, Disp: 5 tablet, Rfl: 6   cetirizine HCl (ZYRTEC) 5 MG/5ML SOLN, Take 10 mLs (10 mg total) by mouth daily. (Patient not taking: Reported on 11/25/2021), Disp: 300 mL, Rfl: 0   FLUoxetine (PROZAC) 10 MG capsule, Take 1 capsule (10 mg total) by mouth daily. (Patient not taking: Reported on 11/25/2021), Disp: 90 capsule, Rfl: 3   ondansetron (ZOFRAN) 8 MG tablet, Take 1 tablet (8 mg total) by mouth every 8 (eight) hours as needed for nausea or vomiting. (Patient not taking: Reported on 11/25/2021), Disp: 30 tablet, Rfl: 0  Objective Blood pressure 138/80, pulse (!) 55, weight 227 lb (103 kg).  Gen WDWN NAD  Pertinent ROS No burning with urination, frequency or urgency No nausea, vomiting or diarrhea Nor fever chills or other constitutional symptoms   Labs or studies     Impression + Management Plan: Diagnoses this Encounter::   ICD-10-CM   1. Female infertility associated with anovulation  N97.0    femara 2.5 mg days 3-7        Medications prescribed during  this encounter: Meds ordered this encounter  Medications   letrozole (FEMARA) 2.5 MG tablet    Sig: Take 1 tablet (2.5 mg total) by mouth daily. For 5 days days 3-7    Dispense:  5 tablet    Refill:  6    Labs or Scans Ordered during this encounter: No orders of the defined types were placed in this encounter.     Follow up Return if symptoms worsen or fail to improve.

## 2021-12-02 DIAGNOSIS — R1319 Other dysphagia: Secondary | ICD-10-CM | POA: Diagnosis not present

## 2021-12-02 DIAGNOSIS — K224 Dyskinesia of esophagus: Secondary | ICD-10-CM | POA: Diagnosis not present

## 2021-12-02 DIAGNOSIS — K219 Gastro-esophageal reflux disease without esophagitis: Secondary | ICD-10-CM | POA: Diagnosis not present

## 2021-12-09 IMAGING — RF DG ESOPHAGUS
8 series · 14 of 24 positions shown · non-contrast
Comparison: None.

CLINICAL DATA: Dysphagia

EXAM:
ESOPHOGRAM / BARIUM SWALLOW / BARIUM TABLET STUDY
TECHNIQUE: Combined double contrast and single contrast examination performed
using effervescent crystals, thick barium liquid, and thin barium
liquid. The patient was observed with fluoroscopy swallowing a 13 mm
barium sulphate tablet.
FLUOROSCOPY TIME:  Fluoroscopy Time:  2 minutes 42 seconds
Radiation Exposure Index (if provided by the fluoroscopic device):
52.8 mGy
Number of Acquired Spot Images: 0

[Series 3: cp_standard · 0.17mm/px · 2 of 53 frames shown (1 of 8)]
[frame 8/53]
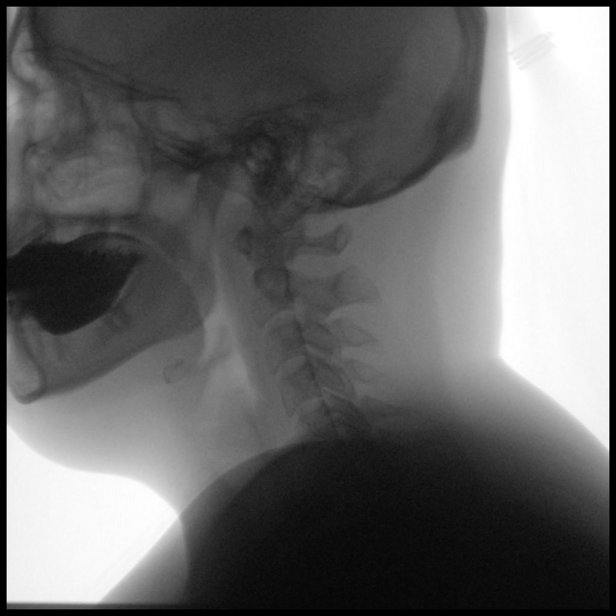
[frame 46/53]
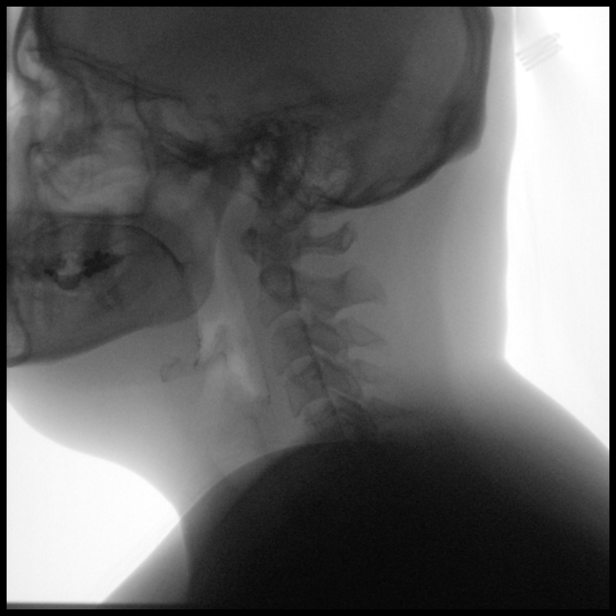

[Series 4: cp_standard · 0.17mm/px · 1 of 61 frames shown (2 of 8)]
[frame 31/61]
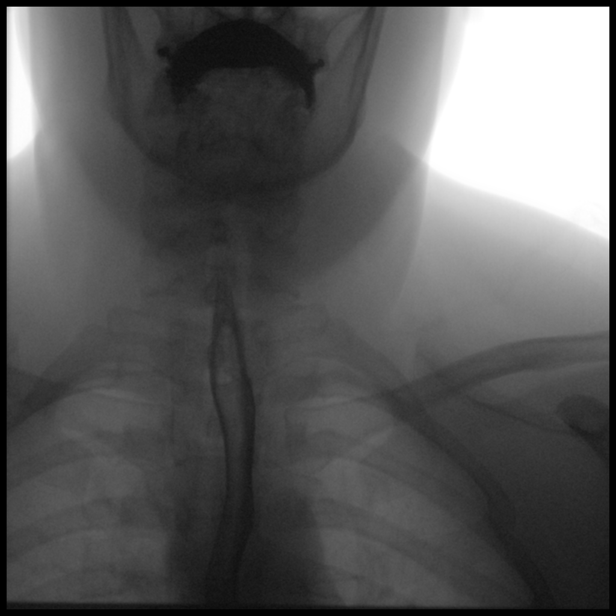

[Series 6: cp_standard · 0.18mm/px · 2 of 155 frames shown (3 of 8)]
[frame 24/155]
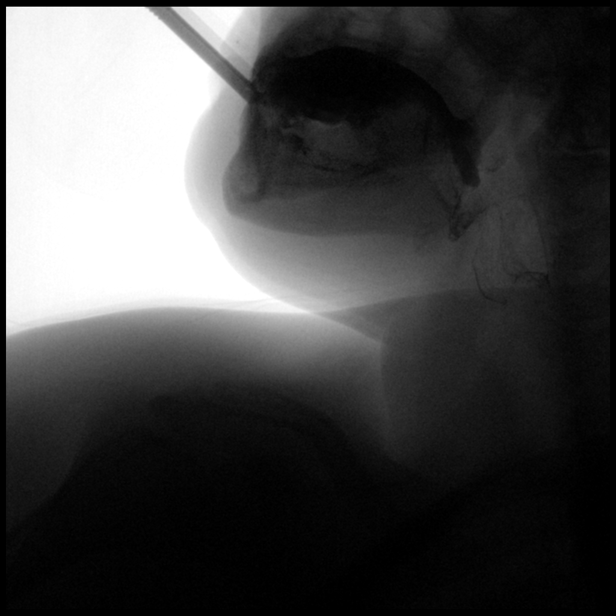
[frame 78/155]
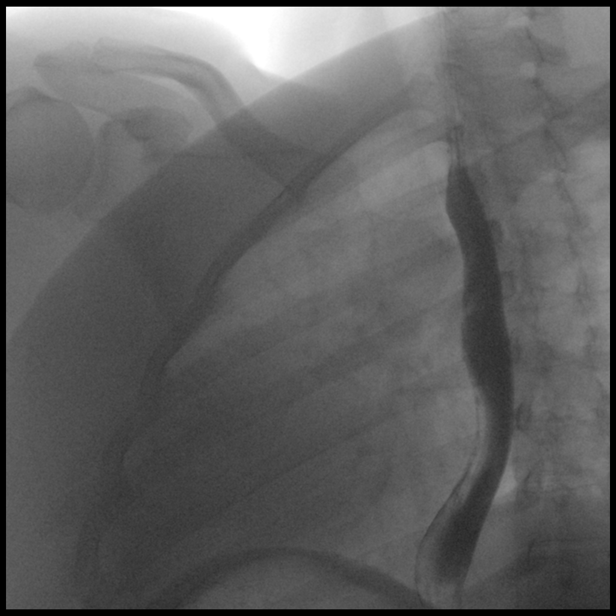

[Series 7: cp_standard · 0.18mm/px · 2 of 155 frames shown (4 of 8)]
[frame 78/155]
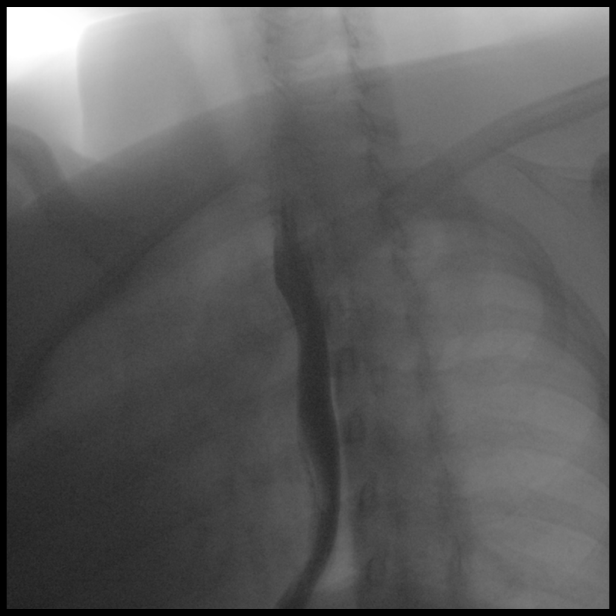
[frame 140/155]
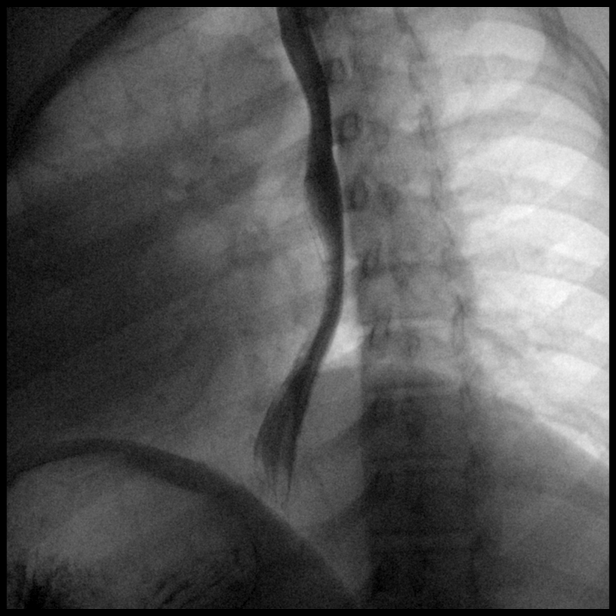

[Series 8: cp_standard · 0.17mm/px · 2 of 170 frames shown (5 of 8)]
[frame 86/170]
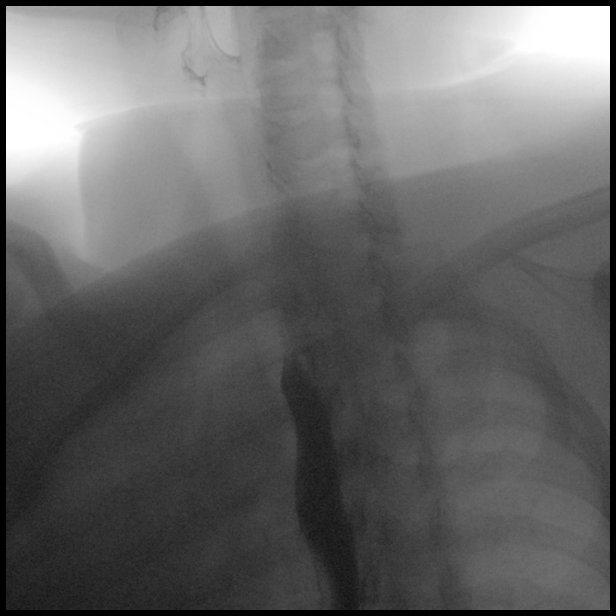
[frame 160/170]
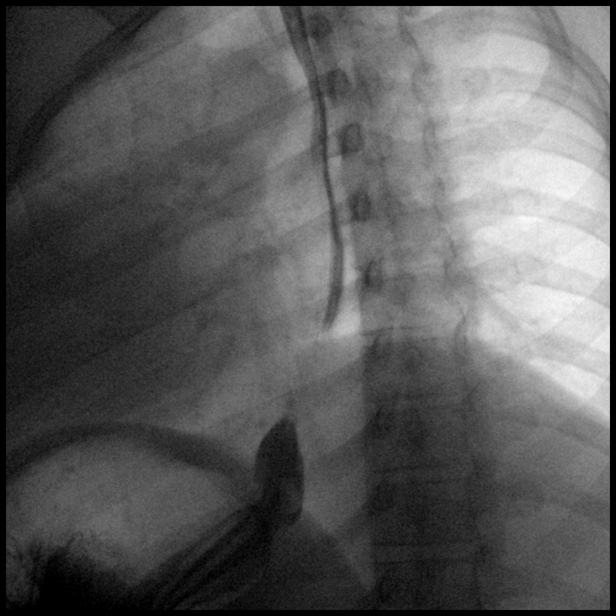

[Series 9: cp_standard · 0.17mm/px · 1 of 170 frames shown (6 of 8)]
[frame 86/170]
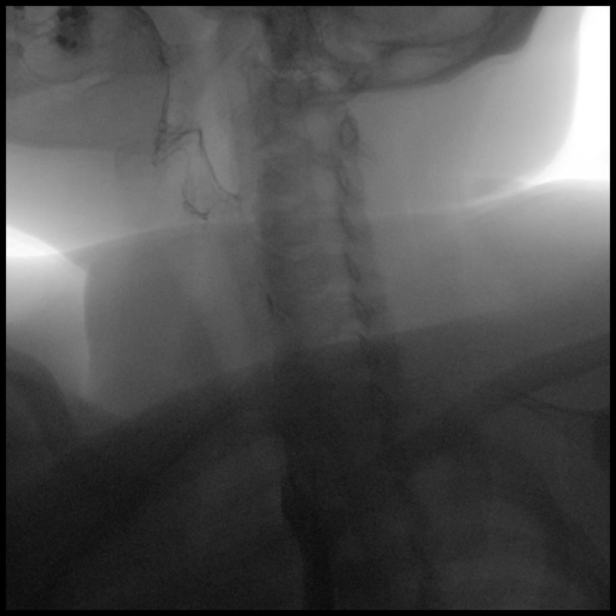

[Series 11: cp_standard · 0.17mm/px · 2 of 204 frames shown (7 of 8)]
[frame 31/204]
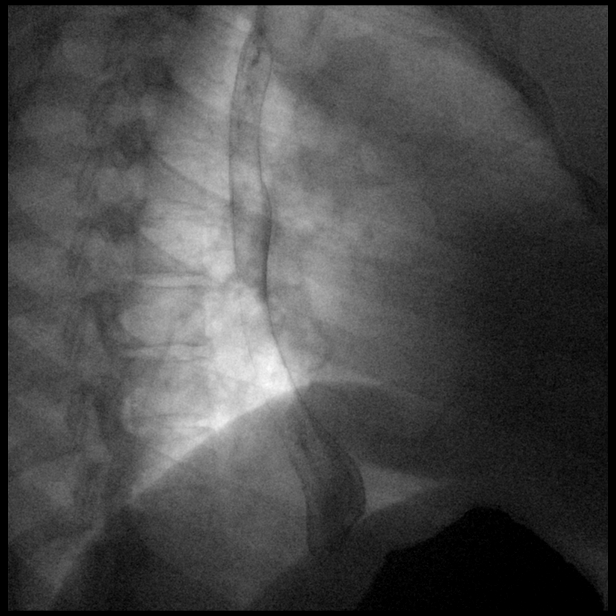
[frame 103/204]
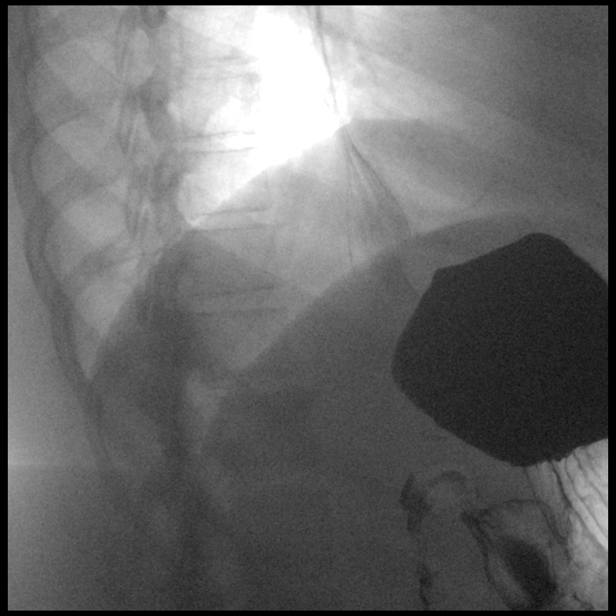

[Series 12: cp_standard · 0.17mm/px · 2 of 143 frames shown (8 of 8)]
[frame 22/143]
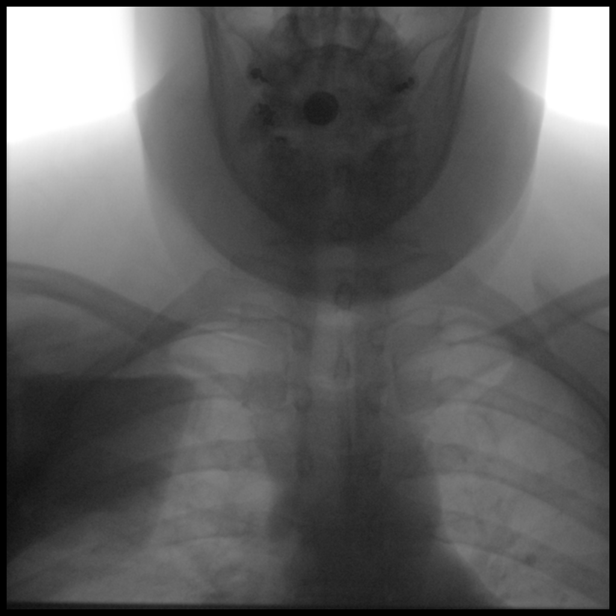
[frame 122/143]
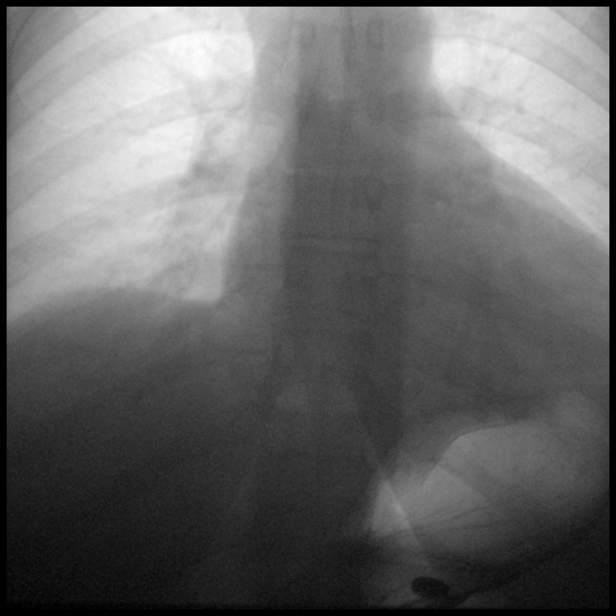

[14 of 24 positions shown; findings below may reference images not displayed]

FINDINGS: Fluoroscopic evaluation of swallowing demonstrates normal appearance
of the cervical esophagus. There is disruption [DATE] primary
esophageal peristaltic waves. No fixed stricture, fold thickening or
mass. No reflux with the water siphon maneuver. The patient
swallowed a 13 mm barium tablet with difficulty. This freely passed
into the stomach.
IMPRESSION: No fixed stricture.

Esophageal dysmotility.

## 2021-12-31 ENCOUNTER — Encounter: Payer: Self-pay | Admitting: Family Medicine

## 2021-12-31 ENCOUNTER — Ambulatory Visit: Payer: Medicaid Other | Admitting: Family Medicine

## 2021-12-31 VITALS — BP 114/65 | HR 46 | Temp 98.3°F | Ht 61.0 in | Wt 214.0 lb

## 2021-12-31 DIAGNOSIS — M5442 Lumbago with sciatica, left side: Secondary | ICD-10-CM

## 2021-12-31 DIAGNOSIS — G8929 Other chronic pain: Secondary | ICD-10-CM

## 2021-12-31 MED ORDER — METHYLPREDNISOLONE ACETATE 80 MG/ML IJ SUSP
80.0000 mg | Freq: Once | INTRAMUSCULAR | Status: AC
  Administered 2021-12-31: 80 mg via INTRAMUSCULAR

## 2021-12-31 NOTE — Progress Notes (Signed)
Established Patient Office Visit  Subjective   Patient ID: Mckenzie Cooley, female    DOB: Jul 29, 1990  Age: 31 y.o. MRN: [id number]  Chief Complaint  Patient presents with   sciatica pain    Back Pain This is a chronic problem. The problem occurs constantly. The problem has been waxing and waning since onset. The pain is present in the gluteal, lumbar spine and sacro-iliac. The quality of the pain is described as aching, shooting and burning. The pain radiates to the left thigh and left knee. The pain is moderate. The symptoms are aggravated by bending, standing and twisting. Associated symptoms include tingling. Pertinent negatives include no bladder incontinence, bowel incontinence, dysuria, fever, headaches, numbness, paresis, paresthesias, perianal numbness or weakness. She has tried analgesics, bed rest, heat, home exercises, NSAIDs and walking for the symptoms. The treatment provided mild relief.     Past Medical History:  Diagnosis Date   Acid reflux    Anemia    Bloating    Childhood asthma    Esophageal dysmotility    History of blood transfusion    PCOS (polycystic ovarian syndrome) 09/27/2014   Preeclampsia    Seasonal allergies       Review of Systems  Constitutional:  Negative for fever.  Gastrointestinal:  Negative for bowel incontinence.  Genitourinary:  Negative for bladder incontinence and dysuria.  Musculoskeletal:  Positive for back pain.  Neurological:  Positive for tingling. Negative for weakness, numbness, headaches and paresthesias.      Objective:     BP 114/65   Pulse (!) 46   Temp 98.3 F (36.8 C)   Ht '5\' 1"'$  (1.549 m)   Wt 214 lb (97.1 kg)   SpO2 98%   BMI 40.43 kg/m    Physical Exam Vitals and nursing note reviewed.  Constitutional:      General: She is not in acute distress.    Appearance: She is not ill-appearing, toxic-appearing or diaphoretic.  Cardiovascular:     Rate and Rhythm: Normal rate and regular rhythm.     Heart  sounds: Normal heart sounds. No murmur heard. Pulmonary:     Effort: Pulmonary effort is normal. No respiratory distress.     Breath sounds: Normal breath sounds.  Abdominal:     General: Bowel sounds are normal. There is no distension.     Palpations: Abdomen is soft.     Tenderness: There is no abdominal tenderness. There is no right CVA tenderness, left CVA tenderness, guarding or rebound.  Musculoskeletal:     Lumbar back: Tenderness present. No swelling, edema, deformity, signs of trauma or bony tenderness. Normal range of motion. Positive left straight leg raise test. Negative right straight leg raise test.  Neurological:     Mental Status: She is alert.      No results found for any visits on 12/31/21.    The ASCVD Risk score (Arnett DK, et al., 2019) failed to calculate for the following reasons:   The 2019 ASCVD risk score is only valid for ages 31 to 59    Assessment & Plan:   Mckenzie Cooley was seen today for sciatica pain.  Diagnoses and all orders for this visit:  Chronic bilateral low back pain with left-sided sciatica Increase in symptoms for last week. No red flags. Steroid IM injection today in office. Continue tylenol, NSAIDs, stretching, heat.  -     methylPREDNISolone acetate (DEPO-MEDROL) injection 80 mg  Return to office for new or worsening symptoms, or if symptoms  persist.   The patient indicates understanding of these issues and agrees with the plan.   Gwenlyn Perking, FNP

## 2022-01-28 ENCOUNTER — Encounter: Payer: Self-pay | Admitting: Family Medicine

## 2022-01-28 ENCOUNTER — Ambulatory Visit: Payer: Medicaid Other | Admitting: Family Medicine

## 2022-01-28 DIAGNOSIS — Z8659 Personal history of other mental and behavioral disorders: Secondary | ICD-10-CM

## 2022-01-28 DIAGNOSIS — F909 Attention-deficit hyperactivity disorder, unspecified type: Secondary | ICD-10-CM | POA: Diagnosis not present

## 2022-01-28 MED ORDER — ATOMOXETINE HCL 40 MG PO CAPS: 40.0000 mg | ORAL_CAPSULE | Freq: Every day | ORAL | 1 refills | Status: DC

## 2022-01-28 NOTE — Progress Notes (Signed)
Virtual Visit  Note Due to COVID-19 pandemic this visit was conducted virtually. This visit type was conducted due to national recommendations for restrictions regarding the COVID-19 Pandemic (e.g. social distancing, sheltering in place) in an effort to limit this patient's exposure and mitigate transmission in our community. All issues noted in this document were discussed and addressed.  A physical exam was not performed with this format.  I connected with Mckenzie Cooley on 01/28/22 at [phone] by telephone and verified that I am speaking with the correct person using two identifiers. Mckenzie Cooley is currently located at home and her daughter is currently with her during the visit. The provider, Gwenlyn Perking, FNP is located in their office at time of visit.  I discussed the limitations, risks, security and privacy concerns of performing an evaluation and management service by telephone and the availability of in person appointments. I also discussed with the patient that there may be a patient responsible charge related to this service. The patient expressed understanding and agreed to proceed.  CC: ADHD  History and Present Illness:  HPI Mckenzie Cooley reports a history of ADHD. She was previously on adderall as a teenager. She reports that this really helped with her focus, attention span, productivity, and her anger outbursts. Lately she has been struggling to control her anger outbursts and she would like to try medication for ADHD again. She also reports that she was on medication for bipolar disorder at one point along with adderall but doesn't recall if she had a diagnosis of this or not. She doesn't recall if that medication was beneficial. She does not wish to try anything controlled. She is interested in a referral.      01/28/2022    9:06 AM 12/31/2021   11:51 AM 08/26/2021    2:54 PM  Depression screen PHQ 2/9  Decreased Interest 0 0 0  Down, Depressed, Hopeless 0 0 0  PHQ - 2 Score 0 0 0   Altered sleeping  3 2  Tired, decreased energy  3 1  Change in appetite  0 1  Feeling bad or failure about yourself   0 0  Trouble concentrating  0 0  Moving slowly or fidgety/restless  0 0  Suicidal thoughts  0 0  PHQ-9 Score  6 4  Difficult doing work/chores  Very difficult Not difficult at all     ROS As per HPI.   Observations/Objective: Alert and oriented x 3. Able to speak in full sentences without difficulty.   Assessment and Plan: Mckenzie Cooley was seen today for adhd.  Diagnoses and all orders for this visit:  Attention deficit hyperactivity disorder (ADHD), unspecified ADHD type Will try strattera as well. Referral to psychiatry placed as well for further evaluation and management.  -     atomoxetine (STRATTERA) 40 MG capsule; Take 1 capsule (40 mg total) by mouth daily. -     Ambulatory referral to Psychiatry  History of bipolar disorder -     Ambulatory referral to Psychiatry   Follow Up Instructions: Follow up in 6 weeks.     I discussed the assessment and treatment plan with the patient. The patient was provided an opportunity to ask questions and all were answered. The patient agreed with the plan and demonstrated an understanding of the instructions.   The patient was advised to call back or seek an in-person evaluation if the symptoms worsen or if the condition fails to improve as anticipated.  The above assessment  and management plan was discussed with the patient. The patient verbalized understanding of and has agreed to the management plan. Patient is aware to call the clinic if symptoms persist or worsen. Patient is aware when to return to the clinic for a follow-up visit. Patient educated on when it is appropriate to go to the emergency department.   Time call ended:  0910  I provided 12 minutes of  non face-to-face time during this encounter.    Gwenlyn Perking, FNP

## 2022-01-29 ENCOUNTER — Other Ambulatory Visit: Payer: Self-pay | Admitting: Family Medicine

## 2022-01-29 DIAGNOSIS — R110 Nausea: Secondary | ICD-10-CM

## 2022-01-29 MED ORDER — ONDANSETRON HCL 8 MG PO TABS: 8.0000 mg | ORAL_TABLET | Freq: Three times a day (TID) | ORAL | 0 refills | Status: DC | PRN

## 2022-01-29 NOTE — Telephone Encounter (Signed)
Last OV 01/28/22. Last RF 10/23/21 # 30 no rf. Next OV none scheduled

## 2022-02-02 ENCOUNTER — Encounter: Payer: Self-pay | Admitting: Family Medicine

## 2022-02-02 ENCOUNTER — Ambulatory Visit: Payer: Medicaid Other | Admitting: Family Medicine

## 2022-02-02 VITALS — BP 122/70 | HR 68 | Temp 98.3°F | Ht 61.0 in | Wt 220.2 lb

## 2022-02-02 DIAGNOSIS — J0100 Acute maxillary sinusitis, unspecified: Secondary | ICD-10-CM | POA: Diagnosis not present

## 2022-02-02 MED ORDER — CEFDINIR 250 MG/5ML PO SUSR: 300.0000 mg | Freq: Two times a day (BID) | ORAL | 0 refills | Status: AC

## 2022-02-02 NOTE — Progress Notes (Signed)
Acute Office Visit  Subjective:     Patient ID: Mckenzie Cooley, female    DOB: May 31, 1991, 31 y.o.   MRN: [id number]  Chief Complaint  Patient presents with   Cough   Nasal Congestion    Cough This is a new problem. Episode onset: 1 week ago. The problem has been unchanged. The cough is Non-productive. Associated symptoms include ear congestion, ear pain, headaches, nasal congestion, postnasal drip and a sore throat. Pertinent negatives include no chest pain, chills, fever, heartburn, hemoptysis, myalgias, rash, rhinorrhea, shortness of breath, sweats, weight loss or wheezing. Nothing aggravates the symptoms. She has tried OTC cough suppressant and rest for the symptoms. The treatment provided no relief. There is no history of asthma, COPD or emphysema.  She has had 3 negative home Covid tests.    Review of Systems  Constitutional:  Negative for chills, fever and weight loss.  HENT:  Positive for ear pain, postnasal drip and sore throat. Negative for rhinorrhea.   Respiratory:  Positive for cough. Negative for hemoptysis, shortness of breath and wheezing.   Cardiovascular:  Negative for chest pain.  Gastrointestinal:  Negative for heartburn.  Musculoskeletal:  Negative for myalgias.  Skin:  Negative for rash.  Neurological:  Positive for headaches.        Objective:    BP 122/70   Pulse 68   Temp 98.3 F (36.8 C) (Temporal)   Ht '5\' 1"'$  (1.549 m)   Wt 220 lb 4 oz (99.9 kg)   SpO2 98%   BMI 41.62 kg/m    Physical Exam Vitals and nursing note reviewed.  Constitutional:      General: She is not in acute distress.    Appearance: She is not toxic-appearing or diaphoretic.  HENT:     Right Ear: Ear canal and external ear normal. A middle ear effusion is present. Tympanic membrane is not perforated, erythematous, retracted or bulging.     Left Ear: Ear canal and external ear normal. A middle ear effusion is present. Tympanic membrane is not perforated, erythematous,  retracted or bulging.     Nose: Congestion present.     Right Sinus: Maxillary sinus tenderness present. No frontal sinus tenderness.     Left Sinus: Maxillary sinus tenderness present. No frontal sinus tenderness.     Mouth/Throat:     Mouth: Mucous membranes are moist.     Pharynx: Posterior oropharyngeal erythema present. No pharyngeal swelling or oropharyngeal exudate.     Tonsils: No tonsillar exudate or tonsillar abscesses. 1+ on the right. 1+ on the left.  Eyes:     General:        Right eye: No discharge.        Left eye: No discharge.     Conjunctiva/sclera: Conjunctivae normal.  Cardiovascular:     Rate and Rhythm: Normal rate and regular rhythm.     Heart sounds: No murmur heard. Pulmonary:     Effort: Pulmonary effort is normal. No respiratory distress.     Breath sounds: Normal breath sounds. No wheezing.  Abdominal:     General: Bowel sounds are normal. There is no distension.     Palpations: Abdomen is soft.     Tenderness: There is no abdominal tenderness. There is no guarding or rebound.  Lymphadenopathy:     Cervical: No cervical adenopathy.  Skin:    General: Skin is warm and dry.  Neurological:     Mental Status: She is alert and oriented to person, place,  and time.  Psychiatric:        Mood and Affect: Mood normal.        Behavior: Behavior normal.     No results found for any visits on 02/02/22.      Assessment & Plan:   [name] was seen today for cough and nasal congestion.  Diagnoses and all orders for this visit:  Acute non-recurrent maxillary sinusitis Omnicef as below. Discussed symptomatic care and return precautions.  -     cefdinir (OMNICEF) 250 MG/5ML suspension; Take 6 mLs (300 mg total) by mouth 2 (two) times daily for 7 days.  Return to office for new or worsening symptoms, or if symptoms persist.   The patient indicates understanding of these issues and agrees with the plan.  Gwenlyn Perking, FNP

## 2022-02-19 ENCOUNTER — Telehealth (INDEPENDENT_AMBULATORY_CARE_PROVIDER_SITE_OTHER): Payer: Medicaid Other | Admitting: Psychiatry

## 2022-02-19 DIAGNOSIS — F4329 Adjustment disorder with other symptoms: Secondary | ICD-10-CM

## 2022-02-19 DIAGNOSIS — R4587 Impulsiveness: Secondary | ICD-10-CM

## 2022-02-19 DIAGNOSIS — G479 Sleep disorder, unspecified: Secondary | ICD-10-CM

## 2022-02-19 DIAGNOSIS — G4700 Insomnia, unspecified: Secondary | ICD-10-CM

## 2022-02-19 DIAGNOSIS — F900 Attention-deficit hyperactivity disorder, predominantly inattentive type: Secondary | ICD-10-CM

## 2022-02-19 DIAGNOSIS — Z8659 Personal history of other mental and behavioral disorders: Secondary | ICD-10-CM

## 2022-02-19 MED ORDER — LAMOTRIGINE 25 MG PO TABS: ORAL_TABLET | ORAL | 0 refills | Status: DC

## 2022-02-19 NOTE — Progress Notes (Signed)
Psychiatric Initial Adult Assessment  Patient Identification: Mckenzie Cooley MRN:  [id number] Date of Evaluation:  02/20/2022 Referral Source: PCP  Assessment:  Mckenzie Cooley is a 31 y.o. y.o. female with a history of previously diagnosed bipolar disorder, ADHD, esophageal dysmotility who presents to [facility] via video conferencing for initial evaluation of bipolar disorder.  Patient's symptoms of multiple nighttime awakenings, chronic fatigue, snoring, co-sleeping with child, bright light (phone) use prior to bed, and caffeine overuse are concerning for a dyssomnia which appears to be multifactorial. 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 She doesn't meet criteria for a major depression as she is not endorsing depressed mood or anhedonia. However, her other symptoms of irritability, fatigue, poor sleep, concentration difficulties, and appetite changes are more consistent with an adjustment disorder. She has already noticed some benefit from initiation of Strattera with regard to irritability and concentration. Do question the prior diagnosis of bipolar illness in a patient that hasn't had sleeplessness and denies the excesses of sex, spending, projects without elevation in mood. This ties in to prior diagnosis of ADHD as all of her siblings carry an ADHD diagnosis as well with commonality being having a stressful home life. Her prior improvement to concentration while on adderall is not diagnostic for ADHD and her current issues with memory could be explained by poor sleep and increased stress as above. Do agree though, with trial of strattera and as long as she is benefiting from this could be that impact on norepinephrine is addressing some of her underlying stress. Her spending and eating in response to stress does  speak to impulsivity which could respond well to a trial of an SSRI. However, this would not be the safest course of action if she does have a bipolar spectrum illness as she is already on Strattera which could further predispose toward mania. Instead, will opt for lamotrigine which should help with sleep, irritability, and impulsivity and be protective against mania. She also has pre-menstrual worsening of her mood symptoms along with cramping and migraines and subsequent alleviation of all symptoms with menstruation. This could qualify her for pre-menstrual dysphoric disorder, the gold standard of treatment then being an SSRI; though for reasons listed above will hold off on this for now. She would likely benefit most from DBT or CBT as gaining further insight into triggers for habits as above and anger management strategies would be helpful.   Plan:  # Adjustment disorder with anxious/irritable mood  r/o PMDD Past medication trials: unknown Status of problem: new to provider Interventions: -- encourage DBT or CBT -- continue strattera '40mg'$  daily -- start lamotrigine '25mg'$  nightly for 2 weeks then increase to '50mg'$  nightly. My chart check in at 2 weeks  # ADHD Past medication trials: adderall, strattera Status of problem: new to provider Interventions: -- continue strattera '40mg'$  daily  # Impulsivity  irritability r/o bipolar spectrum Past medication trials: unknown Status of problem: new to provider Interventions: -- lamotrigine as above  # Insomnia multifactorial Past medication trials: unknown Status of problem: new to provider Interventions: -- lamotrigine as above -- start sleep hygiene -- consider sleep study  Patient was given contact information for behavioral health clinic and was instructed to call 911 for emergencies.   Subjective:  Chief Complaint: No chief complaint on file.   History of Present  Illness:  Went to PCP due to outbursts of anger. Was diagnosed with  bipolar disorder and ADHD in her teens. PCP started an ADHD medication [name]) about 2 weeks ago and seems to have immediately helped "a whole lot." Has been a lot calmer. Had problems staying on task both at work and at home, memory and focus "not great at all." Doesn't lose keys but does lose her phone frequently or her remote. Hasn't had reprimands at work from [employer] but does forget things there. Even with physically writing things down will sometimes forget calendar events. However, all these symptoms did not start until her mid 70s. Got married, started a job, at 60 had their daughter. The anger outbursts are the most recent symptoms. Have also noticed that she and husband are becoming different people. Stress is major contributor.   Lives at home with husband, his parents (for 16 years), and their daughter, Mckenzie Cooley (named after slain coworker of father), one dog and one cat. Recent loss of other dog. Works at [employer] in [employer], is stressful but gets along with coworkers. Likes to play with daughter, bake, watching tv which she still enjoys.   Sleep is horrible, can't remember when she slept well last. Can get to sleep but has frequent awakenings and is tired throughout the day. Had been staying nauseous due to the amount of mountain dew she was drinking and tried switching to tea. Has a switch schedule at work so is either opening or closing. Also co sleeps with daughter. Has been told she snores but not loudly. Has had sleep attacks and sleeps great when this occurs. Has esophageal dysmotility with difficulty in emptying into the stomach and we occasionally have dreams of being unable to swallow something. One of these instances was suicidal because of it from a subsequent panic attack. Was able to wake her husband to help with those feelings when they occurred. Currently limited on what she can eat because is now s/p liquid diet. Tends to snack most of the day with two meals per  day otherwise. No binges with illness. Does have some stress around trying to lose weight, does stress eat. No purging. No guilt feelings. Does get bothered at times with daughter being a "daddy's girl." Did quit her previous job to be able to bond with her daughter. No psychomotor symptoms. Denies SI outside of the panic episode as above. Does have thoughts of what life would be like if she were parenting daughter on her own. No HI. Guns are in the home but are secured in lock box.   When she was a child on a trip to [place] as a child she was up for 72hrs because she didn't want to miss anything. No incidence after age 54. Does have tendency to take on projects but doesn't finish as it relates to attention. No hypersexuality. Does spend money consistently but not beyond means; spending tends to be impulsive. No AVH. No traumatic events. Has been with husband for 16 years total and has good relationship.   OB recently said that she does not have PCOS and is not sure why that was diagnosed. Periods have only recently gotten regular again. Does have heavier menstruation. Does have pre-menstrual worsening of mood. Does get migraines and cramps with periods.   Quit smoking when pregnant. Social drinker, very infrequent with 1 unit or less in one sitting. No other drugs.  Associated Signs/Symptoms: Depression Symptoms:  insomnia, fatigue, impaired memory, anxiety, loss of energy/fatigue,  disturbed sleep, weight loss, decreased appetite, (Hypo) Manic Symptoms:  Distractibility, Impulsivity, Irritable Mood, Anxiety Symptoms:   worry about relationship with daughter Psychotic Symptoms:   none PTSD Symptoms: Negative  Past Psychiatric History:  Diagnoses: bipolar in teens (was also on adderall at the time, noted severe anger issues at the time), ADHD in teens Suicide attempts: none Hospitalizations: none Therapy: Past psychiatrist Ambrose Pancoast  Previous Psychotropic Medications: Yes :  adderall worked well. Uncertain of other medications.   Substance Abuse History in the last 12 months:  No.  Consequences of Substance Abuse: Negative  Past Medical History:  Past Medical History:  Diagnosis Date   Acid reflux    Anemia    Bloating    Childhood asthma    Esophageal dysmotility    History of blood transfusion    Positive urine pregnancy test 12/14/2018   Preeclampsia    Seasonal allergies     Past Surgical History:  Procedure Laterality Date   BIOPSY  02/26/2021   Procedure: BIOPSY;  Surgeon: Rogene Houston, MD;  [facility];  Service: Endoscopy;;  Esophageal biopsy   carpal tunnel Left 01/30/2021   CARPAL TUNNEL RELEASE Right 03/27/2021   CESAREAN SECTION N/A 01/02/2017   Procedure: CESAREAN SECTION;  Surgeon: Chancy Milroy, MD;  Location: [facility];  Service: Obstetrics;  Laterality: N/A;   CHOLECYSTECTOMY  2011   Dr.Byerly   ESOPHAGEAL DILATION N/A 02/26/2021   Procedure: ESOPHAGEAL DILATION;  Surgeon: Rogene Houston, MD;  [facility];  Service: Endoscopy;  Laterality: N/A;   ESOPHAGOGASTRODUODENOSCOPY N/A 10/26/2014   Procedure: ESOPHAGOGASTRODUODENOSCOPY (EGD);  Surgeon: Rogene Houston, MD;  [facility];  Service: Endoscopy;  Laterality: N/A;   ESOPHAGOGASTRODUODENOSCOPY (EGD) WITH PROPOFOL N/A 02/26/2021   Procedure: ESOPHAGOGASTRODUODENOSCOPY (EGD) WITH PROPOFOL;  Surgeon: Rogene Houston, MD;  [facility];  Service: Endoscopy;  Laterality: N/A;   POLYPECTOMY  02/26/2021   Procedure: POLYPECTOMY;  Surgeon: Rogene Houston, MD;  [facility];  Service: Endoscopy;;  Gastric Body Polyps x 3    UPPER GASTROINTESTINAL ENDOSCOPY  12/05/2009   UPPER GASTROINTESTINAL ENDOSCOPY  02/07/2009   WISDOM TOOTH EXTRACTION     WISDOM TOOTH EXTRACTION      Family Psychiatric History: both sisters and brother have ADHD  Family History:  Family History  Problem Relation Age of Onset    Kidney disease Maternal Grandmother    Renal Disease Maternal Grandmother    Heart disease Father    Hypertension Mother    Cancer - Cervical Maternal Aunt     Social History:   Social History   Socioeconomic History   Marital status: Married    Spouse name: Not on file   Number of children: 1   Years of education: Not on file   Highest education level: Not on file  Occupational History   Not on file  Tobacco Use   Smoking status: Former    Packs/day: 0.50    Years: 12.00    Total pack years: 6.00    Types: Cigarettes    Quit date: 05/05/2016    Years since quitting: 5.8   Smokeless tobacco: Never  Vaping Use   Vaping Use: Never used  Substance and Sexual Activity   Alcohol use: No    Alcohol/week: 0.0 standard drinks of alcohol   Drug use: No   Sexual activity: Yes    Birth control/protection: Condom  Other Topics Concern   Not on  file  Social History Narrative   Not on file   Social Determinants of Health   Financial Resource Strain: Low Risk  (02/06/2021)   Overall Financial Resource Strain (CARDIA)    Difficulty of Paying Living Expenses: Not hard at all  Food Insecurity: No Food Insecurity (02/06/2021)   Hunger Vital Sign    Worried About Running Out of Food in the Last Year: Never true    Ran Out of Food in the Last Year: Never true  Transportation Needs: No Transportation Needs (02/06/2021)   PRAPARE - Hydrologist (Medical): No    Lack of Transportation (Non-Medical): No  Physical Activity: Inactive (02/06/2021)   Exercise Vital Sign    Days of Exercise per Week: 0 days    Minutes of Exercise per Session: 0 min  Stress: No Stress Concern Present (02/06/2021)   [place]    Feeling of Stress : Not at all  Social Connections: Moderately Integrated (02/06/2021)   Social Connection and Isolation Panel [NHANES]    Frequency of Communication with Friends and Family:  More than three times a week    Frequency of Social Gatherings with Friends and Family: Once a week    Attends Religious Services: 1 to 4 times per year    Active Member of [employer] or Organizations: No    Attends Archivist Meetings: Never    Marital Status: Married    Additional Social History: see HPI  Allergies:   Allergies  Allergen Reactions   Percocet [Oxycodone-Acetaminophen] Hives and Itching   Prilosec [Omeprazole] Other (See Comments)    Stomach cramp   Amoxicillin Rash    Has patient had a PCN reaction causing immediate rash, facial/tongue/throat swelling, SOB or lightheadedness with hypotension: Yes -mild rash Has patient had a PCN reaction causing severe rash involving mucus membranes or skin necrosis: No Has patient had a PCN reaction that required hospitalization: No Has patient had a PCN reaction occurring within the last 10 years: Yes If all of the above answers are "NO", then may proceed with Cephalosporin use. Has tolerated ceftriaxone & cephalexin     Current Medications: Current Outpatient Medications  Medication Sig Dispense Refill   lamoTRIgine (LAMICTAL) 25 MG tablet Take 1 tablet by mouth nightly for two weeks. Then take 2 tablets by mouth nightly. 42 tablet 0   acetaminophen (TYLENOL) 500 MG tablet Take 500 mg by mouth every 6 (six) hours as needed.     atomoxetine (STRATTERA) 40 MG capsule Take 1 capsule (40 mg total) by mouth daily. 90 capsule 1   cetirizine HCl (ZYRTEC) 5 MG/5ML SOLN Take 10 mLs (10 mg total) by mouth daily. 300 mL 0   dicyclomine (BENTYL) 10 MG capsule Take 1 capsule (10 mg total) by mouth 4 (four) times daily -  before meals and at bedtime. 120 capsule 5   esomeprazole (NEXIUM) 20 MG capsule Take 20 mg by mouth daily at 12 noon.     famotidine (PEPCID) 40 MG tablet Take 40 mg by mouth 2 (two) times daily.     ondansetron (ZOFRAN) 8 MG tablet Take 1 tablet (8 mg total) by mouth every 8 (eight) hours as needed for nausea or  vomiting. 30 tablet 0   No current facility-administered medications for this visit.    ROS: Review of Systems  Constitutional:  Positive for appetite change.  HENT:  Positive for trouble swallowing.   Gastrointestinal:  Positive for  nausea.  Psychiatric/Behavioral:  Positive for decreased concentration and sleep disturbance.     Objective:  Psychiatric Specialty Exam: There were no vitals taken for this visit.There is no height or weight on file to calculate BMI.  General Appearance: Casual, Neat, Well Groomed, and wearing glasses  Eye Contact:  Good  Speech:  Clear and Coherent and Normal Rate  Volume:  Normal  Mood:   "stressed"  Affect:  Appropriate, Congruent, and Full Range  Thought Process:  Coherent, Goal Directed, and Linear  Orientation:  Full (Time, Place, and Person)  Thought Content:  Logical and Rumination on relationship with daughter  Suicidal Thoughts:  No  Homicidal Thoughts:  No  Memory:  Immediate;   Fair Recent;   Fair Remote;   Fair  Judgment:  Fair  Insight:  Fair  Psychomotor Activity:  Normal  Concentration:  Concentration: Fair and Attention Span: Fair; able to attend to conversation and largely stay on topic  Recall:  [place]  Language: Good  Akathisia:  No  Handed:  Right  AIMS (if indicated):  not done  Assets:  Communication Skills Desire for Improvement Financial Resources/Insurance Housing Intimacy Social Support Talents/Skills Transportation Vocational/Educational  ADL's:  Intact  Cognition: WNL  Sleep:  Poor   PE: General: sits comfortably in view of camera; no acute distress. Wearing glasses Pulm: no increased work of breathing on room air  MSK: all extremity movements appear intact  Neuro: no focal neurological deficits observed  Gait & Station: unable to assess by video    Metabolic Disorder Labs: Lab Results  Component Value Date   HGBA1C 5.2 08/20/2020   Lab Results  Component Value Date    PROLACTIN 13.7 03/28/2019   Lab Results  Component Value Date   CHOL 171 06/17/2021   TRIG 80 06/17/2021   HDL 44 06/17/2021   CHOLHDL 3.9 06/17/2021   LDLCALC 112 (H) 06/17/2021   LDLCALC 96 07/20/2017   Lab Results  Component Value Date   TSH 1.630 06/17/2021    Therapeutic Level Labs: No results found for: "LITHIUM" No results found for: "CBMZ" No results found for: "VALPROATE"  Screenings:  [facility] Office Visit from 02/02/2022 in [facility] Visit from 12/31/2021 in [facility] Visit from 08/26/2021 in [facility] Visit from 08/19/2021 in [facility] Visit from 07/17/2021 in [facility]  Total GAD-7 Score '3 3 13 7 19      '$ PHQ2-9    Flowsheet Row Video Visit from 02/19/2022 in [facility] Office Visit from 02/02/2022 in [facility] Office Visit from 01/28/2022 in [facility] Visit from 12/31/2021 in [facility] Visit from 08/26/2021 in [facility]  PHQ-2 Total Score 1 0 0 0 0  PHQ-9 Total Score 12 6 -- 6 4      Flowsheet Row ED to Hosp-Admission (Discharged) from 02/26/2021 in [facility] ED from 10/12/2020 in [facility] No Risk Error: Question 6 not populated       Collaboration of Care: Collaboration of Care: Referral or follow-up with counselor/therapist AEB recommended establishing care with psychotherapy in the community  Patient/Guardian was advised Release of Information must be obtained prior to any record release in order to collaborate their care with an outside provider. Patient/Guardian was advised if they have  not already done so to contact the registration department to sign all necessary forms in order for [place] to  release information regarding their care.   Consent: Patient/Guardian gives verbal consent for treatment and assignment of benefits for services provided during this visit. Patient/Guardian expressed understanding and agreed to proceed.   Televisit via video: I connected with Mckenzie Cooley on 02/20/22 at  3:00 PM EDT by a video enabled telemedicine application and verified that I am speaking with the correct person using two identifiers.  Location: Patient: in car in parking lot of [employer]; alone Provider: [facility]   I discussed the limitations of evaluation and management by telemedicine and the availability of in person appointments. The patient expressed understanding and agreed to proceed.  I discussed the assessment and treatment plan with the patient. The patient was provided an opportunity to ask questions and all were answered. The patient agreed with the plan and demonstrated an understanding of the instructions.   The patient was advised to call back or seek an in-person evaluation if the symptoms worsen or if the condition fails to improve as anticipated.  I provided 65 minutes of non-face-to-face time during this encounter. An additional 10 minutes was spent involved in chart review and documentation.   Debby Bud 9/1/202311:31 AM

## 2022-02-20 ENCOUNTER — Encounter (HOSPITAL_COMMUNITY): Payer: Self-pay | Admitting: Psychiatry

## 2022-02-20 DIAGNOSIS — F4320 Adjustment disorder, unspecified: Secondary | ICD-10-CM | POA: Insufficient documentation

## 2022-02-20 DIAGNOSIS — Z8659 Personal history of other mental and behavioral disorders: Secondary | ICD-10-CM | POA: Insufficient documentation

## 2022-02-20 DIAGNOSIS — F900 Attention-deficit hyperactivity disorder, predominantly inattentive type: Secondary | ICD-10-CM | POA: Insufficient documentation

## 2022-02-20 DIAGNOSIS — G479 Sleep disorder, unspecified: Secondary | ICD-10-CM | POA: Insufficient documentation

## 2022-03-05 ENCOUNTER — Other Ambulatory Visit: Payer: Self-pay | Admitting: Family Medicine

## 2022-03-05 DIAGNOSIS — R110 Nausea: Secondary | ICD-10-CM

## 2022-03-06 ENCOUNTER — Encounter: Payer: Self-pay | Admitting: Family Medicine

## 2022-03-06 ENCOUNTER — Ambulatory Visit: Payer: Medicaid Other | Admitting: Family Medicine

## 2022-03-06 VITALS — BP 121/80 | HR 60 | Temp 99.0°F | Ht 61.0 in | Wt 219.0 lb

## 2022-03-06 DIAGNOSIS — R197 Diarrhea, unspecified: Secondary | ICD-10-CM | POA: Diagnosis not present

## 2022-03-06 DIAGNOSIS — J069 Acute upper respiratory infection, unspecified: Secondary | ICD-10-CM | POA: Diagnosis not present

## 2022-03-06 DIAGNOSIS — R300 Dysuria: Secondary | ICD-10-CM

## 2022-03-06 LAB — URINALYSIS, ROUTINE W REFLEX MICROSCOPIC
Bilirubin, UA: NEGATIVE
Glucose, UA: NEGATIVE
Ketones, UA: NEGATIVE
Leukocytes,UA: NEGATIVE
Nitrite, UA: NEGATIVE
Protein,UA: NEGATIVE
RBC, UA: NEGATIVE
Specific Gravity, UA: 1.025 (ref 1.005–1.030)
Urobilinogen, Ur: 0.2 mg/dL (ref 0.2–1.0)
pH, UA: 6.5 (ref 5.0–7.5)

## 2022-03-06 NOTE — Progress Notes (Signed)
Subjective:  Patient ID: Mckenzie Cooley, female    DOB: Jul 03, 1990, 31 y.o.   MRN: [id number]  Patient Care Team: Gwenlyn Perking, FNP as PCP - General (Family Medicine)   Chief Complaint:  Dysuria, URI, and Diarrhea   HPI: Mckenzie Cooley is a 31 y.o. female presenting on 03/06/2022 for Dysuria, URI, and Diarrhea   Dysuria  This is a new problem. The current episode started today. The problem has been unchanged. Quality: pressure. The pain is mild. There has been no fever. She is Not sexually active. There is No history of pyelonephritis. Pertinent negatives include no chills, discharge, flank pain, frequency, hematuria, hesitancy, nausea, possible pregnancy, sweats, urgency or vomiting. She has tried nothing for the symptoms. The treatment provided no relief.  URI  This is a new problem. Episode onset: 2 days ago, home COVID test negative. There has been no fever. Associated symptoms include abdominal pain, congestion, coughing, diarrhea, dysuria, a plugged ear sensation, rhinorrhea, sinus pain and a sore throat. Pertinent negatives include no chest pain, ear pain, headaches, joint pain, joint swelling, nausea, neck pain, rash, sneezing, swollen glands, vomiting or wheezing. She has tried acetaminophen for the symptoms. The treatment provided no relief.  Diarrhea  This is a recurrent problem. The current episode started more than 1 year ago. The problem has been waxing and waning. Associated symptoms include abdominal pain, coughing and a URI. Pertinent negatives include no arthralgias, bloating, chills, fever, headaches, increased  flatus, myalgias, sweats, vomiting or weight loss. Nothing aggravates the symptoms. She has tried nothing for the symptoms.  Has seen GI in the past but has not made a follow up for the diarrhea. No melena or hematochezia.    Relevant past medical, surgical, family, and social history reviewed and updated as indicated.  Allergies and medications reviewed and  updated. Data reviewed: Chart in Epic.   Past Medical History:  Diagnosis Date   Acid reflux    Anemia    Bloating    Childhood asthma    Esophageal dysmotility    History of blood transfusion    Positive urine pregnancy test 12/14/2018   Preeclampsia    Seasonal allergies     Past Surgical History:  Procedure Laterality Date   BIOPSY  02/26/2021   Procedure: BIOPSY;  Surgeon: Rogene Houston, MD;  [facility];  Service: Endoscopy;;  Esophageal biopsy   carpal tunnel Left 01/30/2021   CARPAL TUNNEL RELEASE Right 03/27/2021   CESAREAN SECTION N/A 01/02/2017   Procedure: CESAREAN SECTION;  Surgeon: Chancy Milroy, MD;  Location: [place];  Service: Obstetrics;  Laterality: N/A;   CHOLECYSTECTOMY  2011   Dr.Byerly   ESOPHAGEAL DILATION N/A 02/26/2021   Procedure: ESOPHAGEAL DILATION;  Surgeon: Rogene Houston, MD;  [facility]: [facility];  Service: Endoscopy;  Laterality: N/A;   ESOPHAGOGASTRODUODENOSCOPY N/A 10/26/2014   Procedure: ESOPHAGOGASTRODUODENOSCOPY (EGD);  Surgeon: Rogene Houston, MD;  [facility];  Service: Endoscopy;  Laterality: N/A;   ESOPHAGOGASTRODUODENOSCOPY (EGD) WITH PROPOFOL N/A 02/26/2021   Procedure: ESOPHAGOGASTRODUODENOSCOPY (EGD) WITH PROPOFOL;  Surgeon: Rogene Houston, MD;  [facility];  Service: Endoscopy;  Laterality: N/A;   POLYPECTOMY  02/26/2021   Procedure: POLYPECTOMY;  Surgeon: Rogene Houston, MD;  [facility];  Service: Endoscopy;;  Gastric Body Polyps x 3    UPPER GASTROINTESTINAL ENDOSCOPY  12/05/2009   UPPER GASTROINTESTINAL ENDOSCOPY  02/07/2009   WISDOM TOOTH EXTRACTION  WISDOM TOOTH EXTRACTION      Social History   Socioeconomic History   Marital status: Married    Spouse name: Not on file   Number of children: 1   Years of education: Not on file   Highest education level: Not on file  Occupational History   Not on file  Tobacco Use   Smoking status: Former     Packs/day: 0.50    Years: 12.00    Total pack years: 6.00    Types: Cigarettes    Quit date: 05/05/2016    Years since quitting: 5.8   Smokeless tobacco: Never  Vaping Use   Vaping Use: Never used  Substance and Sexual Activity   Alcohol use: No    Alcohol/week: 0.0 standard drinks of alcohol   Drug use: No   Sexual activity: Yes    Birth control/protection: Condom  Other Topics Concern   Not on file  Social History Narrative   Not on file   Social Determinants of Health   Financial Resource Strain: Low Risk  (02/06/2021)   Overall Financial Resource Strain (CARDIA)    Difficulty of Paying Living Expenses: Not hard at all  Food Insecurity: No Food Insecurity (02/06/2021)   Hunger Vital Sign    Worried About Running Out of Food in the Last Year: Never true    Ran Out of Food in the Last Year: Never true  Transportation Needs: No Transportation Needs (02/06/2021)   PRAPARE - Hydrologist (Medical): No    Lack of Transportation (Non-Medical): No  Physical Activity: Inactive (02/06/2021)   Exercise Vital Sign    Days of Exercise per Week: 0 days    Minutes of Exercise per Session: 0 min  Stress: No Stress Concern Present (02/06/2021)   [place]    Feeling of Stress : Not at all  Social Connections: Moderately Integrated (02/06/2021)   Social Connection and Isolation Panel [NHANES]    Frequency of Communication with Friends and Family: More than three times a week    Frequency of Social Gatherings with Friends and Family: Once a week    Attends Religious Services: 1 to 4 times per year    Active Member of [employer] or Organizations: No    Attends Archivist Meetings: Never    Marital Status: Married  Human resources officer Violence: Not At Risk (02/06/2021)   Humiliation, Afraid, Rape, and Kick questionnaire    Fear of Current or Ex-Partner: No    Emotionally Abused: No     Physically Abused: No    Sexually Abused: No    Outpatient Encounter Medications as of 03/06/2022  Medication Sig   acetaminophen (TYLENOL) 500 MG tablet Take 500 mg by mouth every 6 (six) hours as needed.   atomoxetine (STRATTERA) 40 MG capsule Take 1 capsule (40 mg total) by mouth daily.   cetirizine HCl (ZYRTEC) 5 MG/5ML SOLN Take 10 mLs (10 mg total) by mouth daily.   dicyclomine (BENTYL) 10 MG capsule Take 1 capsule (10 mg total) by mouth 4 (four) times daily -  before meals and at bedtime.   esomeprazole (NEXIUM) 20 MG capsule TAKE ONE CAPSULE ONCE DAILY BEFORE BREAKFAST   famotidine (PEPCID) 40 MG tablet Take 40 mg by mouth 2 (two) times daily.   lamoTRIgine (LAMICTAL) 25 MG tablet Take 1 tablet by mouth nightly for two weeks. Then take 2 tablets by mouth nightly.   ondansetron (ZOFRAN) 8  MG tablet TAKE ONE TABLET EVERY 8 HOURS AS NEEDED FOR NAUSEA AND VOMITING   No facility-administered encounter medications on file as of 03/06/2022.    Allergies  Allergen Reactions   Percocet [Oxycodone-Acetaminophen] Hives and Itching   Prilosec [Omeprazole] Other (See Comments)    Stomach cramp   Amoxicillin Rash    Has patient had a PCN reaction causing immediate rash, facial/tongue/throat swelling, SOB or lightheadedness with hypotension: Yes -mild rash Has patient had a PCN reaction causing severe rash involving mucus membranes or skin necrosis: No Has patient had a PCN reaction that required hospitalization: No Has patient had a PCN reaction occurring within the last 10 years: Yes If all of the above answers are "NO", then may proceed with Cephalosporin use. Has tolerated ceftriaxone & cephalexin     Review of Systems  Constitutional:  Positive for activity change, appetite change and fatigue. Negative for chills, diaphoresis, fever, unexpected weight change and weight loss.  HENT:  Positive for congestion, postnasal drip, rhinorrhea, sinus pain and sore throat. Negative for dental  problem, drooling, ear discharge, ear pain, facial swelling, hearing loss, mouth sores, nosebleeds, sinus pressure, sneezing, tinnitus, trouble swallowing and voice change.   Eyes:  Negative for photophobia and visual disturbance.  Respiratory:  Positive for cough. Negative for apnea, choking, chest tightness, shortness of breath, wheezing and stridor.   Cardiovascular:  Negative for chest pain, palpitations and leg swelling.  Gastrointestinal:  Positive for abdominal pain and diarrhea. Negative for abdominal distention, anal bleeding, bloating, blood in stool, constipation, flatus, nausea, rectal pain and vomiting.  Genitourinary:  Positive for dysuria. Negative for decreased urine volume, difficulty urinating, flank pain, frequency, hematuria, hesitancy, urgency, vaginal bleeding, vaginal discharge and vaginal pain.  Musculoskeletal:  Negative for arthralgias, joint pain, myalgias and neck pain.  Skin:  Negative for rash.  Neurological:  Negative for dizziness, tremors, seizures, syncope, facial asymmetry, speech difficulty, weakness, light-headedness, numbness and headaches.  Psychiatric/Behavioral:  Negative for confusion.   All other systems reviewed and are negative.       Objective:  BP 121/80   Pulse 60   Temp 99 F (37.2 C)   Ht '5\' 1"'$  (1.549 m)   Wt 219 lb (99.3 kg)   LMP 03/03/2022 (Exact Date) Comment: end on 12th of September  SpO2 96%   BMI 41.38 kg/m    Wt Readings from Last 3 Encounters:  03/06/22 219 lb (99.3 kg)  02/02/22 220 lb 4 oz (99.9 kg)  12/31/21 214 lb (97.1 kg)    Physical Exam Vitals and nursing note reviewed.  Constitutional:      General: She is not in acute distress.    Appearance: Normal appearance. She is well-developed and well-groomed. She is not ill-appearing, toxic-appearing or diaphoretic.  HENT:     Head: Normocephalic and atraumatic.     Jaw: There is normal jaw occlusion.     Right Ear: Hearing, ear canal and external ear normal. A  middle ear effusion is present. Tympanic membrane is not erythematous.     Left Ear: Hearing, ear canal and external ear normal. A middle ear effusion is present. Tympanic membrane is not erythematous.     Nose: Rhinorrhea present. Rhinorrhea is clear.     Right Turbinates: Enlarged. Not swollen or pale.     Left Turbinates: Enlarged. Not swollen or pale.     Right Sinus: No maxillary sinus tenderness or frontal sinus tenderness.     Left Sinus: No maxillary sinus tenderness or frontal  sinus tenderness.     Mouth/Throat:     Lips: Pink.     Mouth: Mucous membranes are moist.     Pharynx: Oropharynx is clear. Uvula midline. Posterior oropharyngeal erythema present. No pharyngeal swelling, oropharyngeal exudate or uvula swelling.     Tonsils: No tonsillar exudate or tonsillar abscesses.  Eyes:     General: Lids are normal.     Extraocular Movements: Extraocular movements intact.     Conjunctiva/sclera: Conjunctivae normal.     Pupils: Pupils are equal, round, and reactive to light.  Neck:     Thyroid: No thyroid mass, thyromegaly or thyroid tenderness.     Vascular: No carotid bruit or JVD.     Trachea: Trachea and phonation normal.  Cardiovascular:     Rate and Rhythm: Normal rate and regular rhythm.     Chest Wall: PMI is not displaced.     Pulses: Normal pulses.     Heart sounds: Normal heart sounds. No murmur heard.    No friction rub. No gallop.  Pulmonary:     Effort: Pulmonary effort is normal. No respiratory distress.     Breath sounds: Normal breath sounds. No wheezing.  Abdominal:     General: Bowel sounds are normal. There is no distension or abdominal bruit.     Palpations: Abdomen is soft. There is no hepatomegaly, splenomegaly or mass.     Tenderness: There is no abdominal tenderness. There is no right CVA tenderness, left CVA tenderness, guarding or rebound.     Hernia: No hernia is present.  Musculoskeletal:        General: Normal range of motion.     Cervical back:  Normal range of motion and neck supple.     Right lower leg: No edema.     Left lower leg: No edema.  Lymphadenopathy:     Cervical: No cervical adenopathy.  Skin:    General: Skin is warm and dry.     Capillary Refill: Capillary refill takes less than 2 seconds.     Coloration: Skin is not cyanotic, jaundiced or pale.     Findings: No rash.  Neurological:     General: No focal deficit present.     Mental Status: She is alert and oriented to person, place, and time.     Sensory: Sensation is intact.     Motor: Motor function is intact.     Coordination: Coordination is intact.     Gait: Gait is intact.     Deep Tendon Reflexes: Reflexes are normal and symmetric.  Psychiatric:        Attention and Perception: Attention and perception normal.        Mood and Affect: Mood and affect normal.        Speech: Speech normal.        Behavior: Behavior normal. Behavior is cooperative.        Thought Content: Thought content normal.        Cognition and Memory: Cognition and memory normal.        Judgment: Judgment normal.     Results for orders placed or performed in visit on 09/03/21  Novel Coronavirus, NAA ([facility])   Specimen: Nasopharyngeal(NP) swabs in vial transport medium  Result Value Ref Range   SARS-CoV-2, NAA Not Detected Not Detected  Veritor Flu A/B Waived  Result Value Ref Range   Influenza A Positive (A) Negative   Influenza B Negative Negative       Pertinent labs &  imaging results that were available during my care of the patient were reviewed by me and considered in my medical decision making.  Assessment & Plan:  Adeena was seen today for dysuria, uri and diarrhea.  Diagnoses and all orders for this visit:  URI with cough and congestion No indications of acute bacterial illness, testing pending. Symptomatic care discussed in detail. Report new, worsening, or persistent symptoms.  -     COVID-19, Flu A+B and RSV  Dysuria Urinalysis unremarkable in office.  Will culture and treat if warranted. Increase water intake and avoid bladder irritants.  -     Urinalysis, Routine w reflex microscopic -     Urine Culture  Diarrhea in adult patient Pt to call GI and schedule follow up as this is chronic and intermittent. She would like to have a colonoscopy. If unable to be seen, will place new referral.     Continue all other maintenance medications.  Follow up plan: Return if symptoms worsen or fail to improve.   Continue healthy lifestyle choices, including diet (rich in fruits, vegetables, and lean proteins, and low in salt and simple carbohydrates) and exercise (at least 30 minutes of moderate physical activity daily).  Educational handout given for URI  The above assessment and management plan was discussed with the patient. The patient verbalized understanding of and has agreed to the management plan. Patient is aware to call the clinic if they develop any new symptoms or if symptoms persist or worsen. Patient is aware when to return to the clinic for a follow-up visit. Patient educated on when it is appropriate to go to the emergency department.   Monia Pouch, FNP-C [facility] [phone]

## 2022-03-06 NOTE — Patient Instructions (Signed)
Aleve Cold and Sinus Plenty of Water Tylenol if needed for fever / pain control.

## 2022-03-07 LAB — COVID-19, FLU A+B AND RSV
Influenza A, NAA: NOT DETECTED
Influenza B, NAA: NOT DETECTED
RSV, NAA: NOT DETECTED
SARS-CoV-2, NAA: DETECTED — AB

## 2022-03-09 LAB — URINE CULTURE

## 2022-03-17 DIAGNOSIS — K625 Hemorrhage of anus and rectum: Secondary | ICD-10-CM | POA: Diagnosis not present

## 2022-03-17 DIAGNOSIS — K224 Dyskinesia of esophagus: Secondary | ICD-10-CM | POA: Diagnosis not present

## 2022-03-17 DIAGNOSIS — K5909 Other constipation: Secondary | ICD-10-CM | POA: Diagnosis not present

## 2022-03-17 DIAGNOSIS — R1319 Other dysphagia: Secondary | ICD-10-CM | POA: Diagnosis not present

## 2022-03-17 DIAGNOSIS — R198 Other specified symptoms and signs involving the digestive system and abdomen: Secondary | ICD-10-CM | POA: Diagnosis not present

## 2022-03-18 ENCOUNTER — Other Ambulatory Visit (HOSPITAL_COMMUNITY): Payer: Self-pay | Admitting: Physician Assistant

## 2022-03-18 ENCOUNTER — Other Ambulatory Visit: Payer: Self-pay | Admitting: Physician Assistant

## 2022-03-18 DIAGNOSIS — R221 Localized swelling, mass and lump, neck: Secondary | ICD-10-CM

## 2022-03-19 ENCOUNTER — Encounter (INDEPENDENT_AMBULATORY_CARE_PROVIDER_SITE_OTHER): Payer: Medicaid Other | Admitting: Psychiatry

## 2022-03-19 DIAGNOSIS — Z91199 Patient's noncompliance with other medical treatment and regimen due to unspecified reason: Secondary | ICD-10-CM

## 2022-03-20 ENCOUNTER — Ambulatory Visit (HOSPITAL_COMMUNITY)
Admission: RE | Admit: 2022-03-20 | Discharge: 2022-03-20 | Disposition: A | Discharge status: Home / Self Care | Payer: Medicaid Other | Source: Ambulatory Visit | Attending: Physician Assistant | Admitting: Physician Assistant

## 2022-03-20 DIAGNOSIS — E041 Nontoxic single thyroid nodule: Secondary | ICD-10-CM | POA: Diagnosis not present

## 2022-03-20 DIAGNOSIS — R221 Localized swelling, mass and lump, neck: Secondary | ICD-10-CM | POA: Insufficient documentation

## 2022-03-20 NOTE — Progress Notes (Signed)
Patient no show

## 2022-03-27 ENCOUNTER — Telehealth: Payer: Self-pay | Admitting: Family Medicine

## 2022-03-27 NOTE — Telephone Encounter (Signed)
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   Pt scheduled for [place] at [facility]

## 2022-04-28 ENCOUNTER — Telehealth: Payer: Medicaid Other | Admitting: Nurse Practitioner

## 2022-04-28 ENCOUNTER — Encounter: Payer: Self-pay | Admitting: Nurse Practitioner

## 2022-04-28 DIAGNOSIS — J069 Acute upper respiratory infection, unspecified: Secondary | ICD-10-CM

## 2022-04-28 MED ORDER — PROMETHAZINE-DM 6.25-15 MG/5ML PO SYRP: 5.0000 mL | ORAL_SOLUTION | Freq: Four times a day (QID) | ORAL | 0 refills | Status: DC | PRN

## 2022-04-28 NOTE — Progress Notes (Signed)
Virtual Visit Consent   Mckenzie Cooley, you are scheduled for a virtual visit with Mckenzie Cooley, [place], a [facility] provider, today.     Just as with appointments in the office, your consent must be obtained to participate.  Your consent will be active for this visit and any virtual visit you may have with one of our providers in the next 365 days.     If you have a MyChart account, a copy of this consent can be sent to you electronically.  All virtual visits are billed to your insurance company just like a traditional visit in the office.    As this is a virtual visit, video technology does not allow for your provider to perform a traditional examination.  This may limit your provider's ability to fully assess your condition.  If your provider identifies any concerns that need to be evaluated in person or the need to arrange testing (such as labs, EKG, etc.), we will make arrangements to do so.     Although advances in technology are sophisticated, we cannot ensure that it will always work on either your end or our end.  If the connection with a video visit is poor, the visit may have to be switched to a telephone visit.  With either a video or telephone visit, we are not always able to ensure that we have a secure connection.     I need to obtain your verbal consent now.   Are you willing to proceed with your visit today? YES   [name] has provided verbal consent on 04/28/2022 for a virtual visit (video or telephone).   Mckenzie Hassell Done, FNP   Date: 04/28/2022 8:00 AM   Virtual Visit via Video Note   I, Mckenzie Cooley, connected with MILISSA Cooley ([id number], [birth year]) on 04/28/22 at  8:35 AM EST by a video-enabled telemedicine application and verified that I am speaking with the correct person using two identifiers.  Location: Patient: Virtual Visit Location Patient: Home Provider: Virtual Visit Location Provider: Mobile   I discussed the limitations of  evaluation and management by telemedicine and the availability of in person appointments. The patient expressed understanding and agreed to proceed.    History of Present Illness: Mckenzie Cooley is a 31 y.o. who identifies as a female who was assigned female at birth, and is being seen today for uri.  HPI: URI  This is a new problem. The current episode started in the past 7 days. The problem has been gradually improving. The maximum temperature recorded prior to her arrival was 100.4 - 100.9 F. The fever has been present for Less than 1 day. Associated symptoms include congestion, coughing, rhinorrhea and sneezing. Pertinent negatives include no sore throat or wheezing. Associated symptoms comments: No taste or smell. She has tried acetaminophen for the symptoms. The treatment provided mild relief.   Home covid negative Review of Systems  HENT:  Positive for congestion, rhinorrhea and sneezing. Negative for sore throat.   Respiratory:  Positive for cough. Negative for wheezing.     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   Attention deficit hyperactivity disorder (ADHD), predominantly inattentive type 02/20/2022   History of bipolar disorder 02/20/2022   GAD (generalized anxiety disorder) 08/26/2021   Current moderate 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 episode ([place]) 08/26/2021   Esophageal dysmotility    RUQ abdominal pain  Vaginal irritation 04/22/2021   Vaginal discharge 04/01/2021   Routine general medical examination at a health care facility 02/06/2021   Patient desires pregnancy 02/06/2021   Bilateral carpal tunnel syndrome 11/06/2020   Spotting 12/14/2018   Carpal tunnel syndrome, left upper limb 06/01/2018   Migraine 12/09/2017   Iron deficiency 07/20/2017   Upper respiratory infection 03/24/2017   Allergic otitis media of both ears 03/24/2017   History of severe pre-eclampsia  02/23/2017   History of anemia 02/23/2017   Previous cesarean section 02/09/2017   H/O severe preeclampsia 02/09/2017   Postoperative anemia due to acute blood loss 01/06/2017   Constipation 10/27/2014   Asthma 07/01/2011   Gastroesophageal reflux disease 07/01/2011    Allergies:  Allergies  Allergen Reactions   Percocet [Oxycodone-Acetaminophen] Hives and Itching   Prilosec [Omeprazole] Other (See Comments)    Stomach cramp   Amoxicillin Rash    Has patient had a PCN reaction causing immediate rash, facial/tongue/throat swelling, SOB or lightheadedness with hypotension: Yes -mild rash Has patient had a PCN reaction causing severe rash involving mucus membranes or skin necrosis: No Has patient had a PCN reaction that required hospitalization: No Has patient had a PCN reaction occurring within the last 10 years: Yes If all of the above answers are "NO", then may proceed with Cephalosporin use. Has tolerated ceftriaxone & cephalexin    Medications:  Current Outpatient Medications:    acetaminophen (TYLENOL) 500 MG tablet, Take 500 mg by mouth every 6 (six) hours as needed., Disp: , Rfl:    atomoxetine (STRATTERA) 40 MG capsule, Take 1 capsule (40 mg total) by mouth daily., Disp: 90 capsule, Rfl: 1   cetirizine HCl (ZYRTEC) 5 MG/5ML SOLN, Take 10 mLs (10 mg total) by mouth daily., Disp: 300 mL, Rfl: 0   dicyclomine (BENTYL) 10 MG capsule, Take 1 capsule (10 mg total) by mouth 4 (four) times daily -  before meals and at bedtime., Disp: 120 capsule, Rfl: 5   esomeprazole (NEXIUM) 20 MG capsule, TAKE ONE CAPSULE ONCE DAILY BEFORE BREAKFAST, Disp: 30 capsule, Rfl: 12   famotidine (PEPCID) 40 MG tablet, Take 40 mg by mouth 2 (two) times daily., Disp: , Rfl:    lamoTRIgine (LAMICTAL) 25 MG tablet, Take 1 tablet by mouth nightly for two weeks. Then take 2 tablets by mouth nightly., Disp: 42 tablet, Rfl: 0   ondansetron (ZOFRAN) 8 MG tablet, TAKE ONE TABLET EVERY 8 HOURS AS NEEDED FOR NAUSEA AND  VOMITING, Disp: 30 tablet, Rfl: 0  Observations/Objective: Patient is well-developed, well-nourished in no acute distress.  Resting comfortably  at home.  Head is normocephalic, atraumatic.  No labored breathing.  Speech is clear and coherent with logical content.  Patient is alert and oriented at baseline.  Raspy voice  Assessment and Plan:  LYDIANN BONIFAS in today with chief complaint of URI   1. Viral URI with cough 1. Take meds as prescribed 2. Use a cool mist humidifier especially during the winter months and when heat has been humid. 3. Use saline nose sprays frequently 4. Saline irrigations of the nose can be very helpful if Cooley frequently.  * 4X daily for 1 week*  * Use of a nettie pot can be helpful with this. Follow directions with this* 5. Drink plenty of fluids 6. Keep thermostat turn down low 7.For any cough or congestion- promethazine DM 8. For fever or aces or pains- take tylenol or ibuprofen appropriate for age and weight.  * for fevers greater than  101 orally you may alternate ibuprofen and tylenol every  3 hours.   Meds ordered this encounter  Medications   promethazine-dextromethorphan (PROMETHAZINE-DM) 6.25-15 MG/5ML syrup    Sig: Take 5 mLs by mouth 4 (four) times daily as needed for cough.    Dispense:  118 mL    Refill:  0    Order Specific Question:   Supervising Provider    Answer:   Caryl Pina A [[id number]]      Follow Up Instructions: I discussed the assessment and treatment plan with the patient. The patient was provided an opportunity to ask questions and all were answered. The patient agreed with the plan and demonstrated an understanding of the instructions.  A copy of instructions were sent to the patient via MyChart.  The patient was advised to call back or seek an in-person evaluation if the symptoms worsen or if the condition fails to improve as anticipated.  Time:  I spent 6 minutes with the patient via telehealth technology  discussing the above problems/concerns.    Mckenzie Hassell Done, FNP

## 2022-04-28 NOTE — Patient Instructions (Signed)

## 2022-04-29 ENCOUNTER — Other Ambulatory Visit: Payer: Self-pay | Admitting: Family Medicine

## 2022-04-29 DIAGNOSIS — R110 Nausea: Secondary | ICD-10-CM

## 2022-05-04 ENCOUNTER — Encounter (INDEPENDENT_AMBULATORY_CARE_PROVIDER_SITE_OTHER): Payer: Self-pay | Admitting: *Deleted

## 2022-05-04 ENCOUNTER — Encounter (INDEPENDENT_AMBULATORY_CARE_PROVIDER_SITE_OTHER): Payer: Self-pay | Admitting: Gastroenterology

## 2022-05-04 ENCOUNTER — Ambulatory Visit (INDEPENDENT_AMBULATORY_CARE_PROVIDER_SITE_OTHER): Payer: Medicaid Other | Admitting: Nurse Practitioner

## 2022-05-04 ENCOUNTER — Encounter: Payer: Self-pay | Admitting: Nurse Practitioner

## 2022-05-04 ENCOUNTER — Ambulatory Visit (INDEPENDENT_AMBULATORY_CARE_PROVIDER_SITE_OTHER): Payer: Medicaid Other | Admitting: Gastroenterology

## 2022-05-04 VITALS — BP 120/82 | HR 62 | Temp 97.8°F | Ht 60.0 in | Wt 223.9 lb

## 2022-05-04 DIAGNOSIS — K581 Irritable bowel syndrome with constipation: Secondary | ICD-10-CM | POA: Diagnosis not present

## 2022-05-04 DIAGNOSIS — K625 Hemorrhage of anus and rectum: Secondary | ICD-10-CM | POA: Diagnosis not present

## 2022-05-04 DIAGNOSIS — K224 Dyskinesia of esophagus: Secondary | ICD-10-CM

## 2022-05-04 DIAGNOSIS — J069 Acute upper respiratory infection, unspecified: Secondary | ICD-10-CM

## 2022-05-04 DIAGNOSIS — K589 Irritable bowel syndrome without diarrhea: Secondary | ICD-10-CM | POA: Insufficient documentation

## 2022-05-04 MED ORDER — CLENPIQ 10-3.5-12 MG-GM -GM/175ML PO SOLN: 1.0000 | ORAL | 0 refills | Status: DC

## 2022-05-04 MED ORDER — LINACLOTIDE 145 MCG PO CAPS: 145.0000 ug | ORAL_CAPSULE | Freq: Every day | ORAL | 3 refills | Status: DC

## 2022-05-04 MED ORDER — DOXYCYCLINE HYCLATE 100 MG PO TABS: 100.0000 mg | ORAL_TABLET | Freq: Two times a day (BID) | ORAL | 0 refills | Status: DC

## 2022-05-04 NOTE — Patient Instructions (Signed)
1. Take meds as prescribed 2. Use a cool mist humidifier especially during the winter months and when heat has been humid. 3. Use saline nose sprays frequently 4. Saline irrigations of the nose can be very helpful if done frequently.  * 4X daily for 1 week*  * Use of a nettie pot can be helpful with this. Follow directions with this* 5. Drink plenty of fluids 6. Keep thermostat turn down low 7.For any cough or congestion- robitussin DM 8. For fever or aces or pains- take tylenol or ibuprofen appropriate for age and weight.  * for fevers greater than 101 orally you may alternate ibuprofen and tylenol every  3 hours.

## 2022-05-04 NOTE — Progress Notes (Signed)
Mckenzie Cooley, M.D. Gastroenterology & Hepatology [facility] [address] [place], [place] [address]  Primary Care Physician: Gwenlyn Perking, [place] [address]  I will communicate my assessment and recommendations to the referring MD via EMR.  Problems: Esophageal dysmotility - IEM Rectal bleeding Constipation  History of Present Illness: Mckenzie Cooley is a 31 y.o. female with past medical history of esophageal dysmotility, anemia, asthma, IBS, who presents for evaluation of constipation and rectal bleeding.  The patient was last seen on 07/08/2021 (Dr. Laural Golden). At that time, the patient was presenting some right upper quadrant pain and was advised to take dicyclomine as needed for abdominal pain.  The patient was referred for evaluation by Dr. Truddie Crumble at [facility].  Last time seen by his office was on 12/02/2021.  Patient was advised to take medications with yogurt or applesauce to allow adequate swallowing of tablets and to sip on liquids with meals. She had a previous esophagram showing slow emptying of the 30 mm tablet from the distal esophagus into the stomach but no standing: After 1 minute.  She also underwent endoFLIP, which showed pressure of 30 mmHg at 30 ml, compliance was 125, CSA was 34,and contractions were seen but difficult to describe secondary to the 8cm balloon . At 36m the balloon pressure was 359mg, DI 02, minimum diameter was 76m72mcompliance was 45, CSA was 45.  This was consistent with reduced distensibility with absent contractile response.  She underwent an empiric dilation with savory dilator up to 18 mm.  Most recent manometry - 05/02/21 *Notes. Motility values are mean among swallows;: Simultaneous contractions: Velocity > 8.0 cm/s; eSlv: eSleeve; 3SN, IRP, DCI, IBP - See manual definitions Supine liquid swallow interpretation : The patient has >50% with a DCI<450 but less than 70%. There is a  normal tensive normal relaxing LES. Inconclusive diagnosis of IEM Multiple rapid swallows : Normal deglutitive inhibition and adequate contractile reserve Upright liquid swallow analysis : 4 swallows with a DCI<450 and IRP<19m80mRapid drink challenge : Normal deglutitive inhibition and IRP <19mm87mabnormal impedance bolus clearance. Impedance analysis : abnormal bolus clearance for liquids. Normal esophageal impedance integral ratio Overall impression :.The diagnosis of ineffective esophageal motility is possible based on the manometry but not conclusive. The lack of contractile reserve on multiple rapid swallows and DCI< than 450 in the majority of the upright swallows is concerning and supports a diagnosis of IEM. Would correlate with bolus clearance on barium esophagram.   Patient reports that she has presented recurrent episodes of constipation for the last 2 months. She states that every time she moves her bowels, she has to push stool out with a glove (she had to do this in 5 occasions). Sometimes she has has to strain significantly to move her bowels, but sometimes stool comes out spontaneously. She has noticed occasionally some fresh blood in her stool. She has a BM every 4 days, but she used to have a BM every day in the past. States that she has abdominal bloating and epigastric abdominal pain, which improves significantly after having a bowel movement. She was taking stool softeners daily without relief. She reports feeling nauseated frequently, worse at night. She has had one pregnancy, which was a C-section.  Patient was supposed to have a colonoscopy in December with Dr. ClaytTruddie Crumbleshe [facility]  States her swallowing is the same,  she tries to chew thoroughly her food and takes usually an hour to have a meal.  Has not presented any point-episodes or require to vomit the food.  The patient denies having any  vomiting, fever, chills, melena, hematemesis, abdominal distention, diarrhea, jaundice, pruritus. She has lost 20 lb since her esophageal symptoms started, she has also noticed some change sin her appetite since constipation started.  Last EGD: as above Last Colonoscopy: never  Past Medical History: Past Medical History:  Diagnosis Date   Acid reflux    Anemia    Bloating    Childhood asthma    Esophageal dysmotility    History of blood transfusion    Positive urine pregnancy test 12/14/2018   Preeclampsia    Seasonal allergies     Past Surgical History: Past Surgical History:  Procedure Laterality Date   BIOPSY  02/26/2021   Procedure: BIOPSY;  Surgeon: Rogene Houston, MD;  [facility];  Service: Endoscopy;;  Esophageal biopsy   carpal tunnel Left 01/30/2021   CARPAL TUNNEL RELEASE Right 03/27/2021   CESAREAN SECTION N/A 01/02/2017   Procedure: CESAREAN SECTION;  Surgeon: Chancy Milroy, MD;  Location: [facility];  Service: Obstetrics;  Laterality: N/A;   CHOLECYSTECTOMY  2011   Dr.Byerly   ESOPHAGEAL DILATION N/A 02/26/2021   Procedure: ESOPHAGEAL DILATION;  Surgeon: Rogene Houston, MD;  [facility];  Service: Endoscopy;  Laterality: N/A;   ESOPHAGOGASTRODUODENOSCOPY N/A 10/26/2014   Procedure: ESOPHAGOGASTRODUODENOSCOPY (EGD);  Surgeon: Rogene Houston, MD;  [facility];  Service: Endoscopy;  Laterality: N/A;   ESOPHAGOGASTRODUODENOSCOPY (EGD) WITH PROPOFOL N/A 02/26/2021   Procedure: ESOPHAGOGASTRODUODENOSCOPY (EGD) WITH PROPOFOL;  Surgeon: Rogene Houston, MD;  [facility];  Service: Endoscopy;  Laterality: N/A;   POLYPECTOMY  02/26/2021   Procedure: POLYPECTOMY;  Surgeon: Rogene Houston, MD;  [facility];  Service: Endoscopy;;  Gastric Body Polyps x 3    UPPER GASTROINTESTINAL ENDOSCOPY  12/05/2009   UPPER GASTROINTESTINAL ENDOSCOPY  02/07/2009   WISDOM TOOTH EXTRACTION     WISDOM TOOTH  EXTRACTION      Family History: Family History  Problem Relation Age of Onset   Kidney disease Maternal Grandmother    Renal Disease Maternal Grandmother    Heart disease Father    Hypertension Mother    Cancer - Cervical Maternal Aunt     Social History: Social History   Tobacco Use  Smoking Status Former   Packs/day: 0.50   Years: 12.00   Total pack years: 6.00   Types: Cigarettes   Quit date: 05/05/2016   Years since quitting: 6.0  Smokeless Tobacco Never   Social History   Substance and Sexual Activity  Alcohol Use No   Alcohol/week: 0.0 standard drinks of alcohol   Social History   Substance and Sexual Activity  Drug Use No    Allergies: Allergies  Allergen Reactions   Percocet [Oxycodone-Acetaminophen] Hives and Itching   Prilosec [Omeprazole] Other (See Comments)    Stomach cramp   Amoxicillin Rash    Has patient had a PCN reaction causing immediate rash, facial/tongue/throat swelling, SOB or lightheadedness with hypotension: Yes -mild rash Has patient had a PCN reaction causing severe rash involving mucus membranes or skin necrosis: No Has patient had a PCN reaction that required hospitalization: No Has patient had a PCN reaction occurring within the last 10 years: Yes If all of the above answers are "NO", then may proceed with Cephalosporin  use. Has tolerated ceftriaxone & cephalexin     Medications: Current Outpatient Medications  Medication Sig Dispense Refill   acetaminophen (TYLENOL) 500 MG tablet Take 500 mg by mouth every 6 (six) hours as needed.     cetirizine HCl (ZYRTEC) 5 MG/5ML SOLN Take 10 mLs (10 mg total) by mouth daily. 300 mL 0   dicyclomine (BENTYL) 10 MG capsule Take 1 capsule (10 mg total) by mouth 4 (four) times daily -  before meals and at bedtime. 120 capsule 5   doxycycline (VIBRA-TABS) 100 MG tablet Take 1 tablet (100 mg total) by mouth 2 (two) times daily. 1 po bid 20 tablet 0   esomeprazole (NEXIUM) 20 MG capsule TAKE ONE  CAPSULE ONCE DAILY BEFORE BREAKFAST 30 capsule 12   famotidine (PEPCID) 40 MG tablet Take 40 mg by mouth 2 (two) times daily.     ondansetron (ZOFRAN) 8 MG tablet TAKE ONE TABLET EVERY 8 HOURS AS NEEDED FOR NAUSEA AND VOMITING 30 tablet 0   promethazine-dextromethorphan (PROMETHAZINE-DM) 6.25-15 MG/5ML syrup Take 5 mLs by mouth 4 (four) times daily as needed for cough. 118 mL 0   No current facility-administered medications for this visit.    Review of Systems: GENERAL: negative for malaise, night sweats HEENT: No changes in hearing or vision, no nose bleeds or other nasal problems. NECK: Negative for lumps, goiter, pain and significant neck swelling RESPIRATORY: Negative for cough, wheezing CARDIOVASCULAR: Negative for chest pain, leg swelling, palpitations, orthopnea GI: SEE HPI MUSCULOSKELETAL: Negative for joint pain or swelling, back pain, and muscle pain. SKIN: Negative for lesions, rash PSYCH: Negative for sleep disturbance, mood disorder and recent psychosocial stressors. HEMATOLOGY Negative for prolonged bleeding, bruising easily, and swollen nodes. ENDOCRINE: Negative for cold or heat intolerance, polyuria, polydipsia and goiter. NEURO: negative for tremor, gait imbalance, syncope and seizures. The remainder of the review of systems is noncontributory.   Physical Exam: BP 120/82 (BP Location: Left Arm, Patient Position: Sitting, Cuff Size: Large)   Pulse 62   Temp 97.8 F (36.6 C) (Temporal)   Ht 5' (1.524 m)   Wt 223 lb 14.4 oz (101.6 kg)   BMI 43.73 kg/m  GENERAL: The patient is AO x3, in no acute distress. HEENT: Head is normocephalic and atraumatic. EOMI are intact. Mouth is well hydrated and without lesions. NECK: Supple. No masses LUNGS: Clear to auscultation. No presence of rhonchi/wheezing/rales. Adequate chest expansion HEART: RRR, normal s1 and s2. ABDOMEN: mildly tender diffusely, no guarding, no peritoneal signs, and nondistended. BS +. No masses. RECTAL  EXAM: presence of small external hemorrhoids, normal tone, decrease squeeze when straining, no masses, brown stool without blood. Chaperone: Teaching laboratory technician EXTREMITIES: Without any cyanosis, clubbing, rash, lesions or edema. NEUROLOGIC: AOx3, no focal motor deficit. SKIN: no jaundice, no rashes  Imaging/Labs: as above  I personally reviewed and interpreted the available labs, imaging and endoscopic files.  Impression and Plan: MAKEISHA JENTSCH is a 31 y.o. female with past medical history of esophageal dysmotility, anemia, asthma, IBS, who presents for evaluation of constipation and rectal bleeding.  Patient has presented for new onset of episodes of constipation which are different from her usual bowel movement frequency.  She used to have diarrhea in the past but now is presenting constipation with significant abdominal discomfort that is.  Given her history of IEM, it is possible that she has motility issues in other parts of her bowel.  Her rectal exam was suggestive of decreased perianal descend. At this moment, we will  start her on Linzess 145 mcg every day but if this does not improve her symptoms may consider further evaluation with an anorectal manometry.  Given the presence of rectal bleeding, I agree she needs to have further evaluation with a colonoscopy -patient requested to schedule this procedure our hospital and she will reach [facility] to cancel her procedure.  In terms of her dysphagia, she should continue with the current dietary/lifestyle modifications for management of IEM.  - Schedule colonoscopy - Start Linzess 145 mcg qday - Continue with dietary modifications and swallowing water with meals - Take medications with applesauce - Follow up with Dr. Truddie Crumble regarding dysphagia  All questions were answered.      Mckenzie Peppers, MD Gastroenterology and Hepatology [facility]

## 2022-05-04 NOTE — Patient Instructions (Signed)
Schedule colonoscopy Start Linzess 145 mcg qday Continue with dietary modifications and swallowing water with meals Take medications with applesauce Follow up with Dr. Truddie Crumble regarding swallowing issues

## 2022-05-04 NOTE — Progress Notes (Signed)
Virtual Visit  Note Due to COVID-19 pandemic this visit was conducted virtually. This visit type was conducted due to national recommendations for restrictions regarding the COVID-19 Pandemic (e.g. social distancing, sheltering in place) in an effort to limit this patient's exposure and mitigate transmission in our community. All issues noted in this document were discussed and addressed.  A physical exam was not performed with this format.  I connected with Mckenzie Cooley on 05/04/22 at 8:46 by telephone and verified that I am speaking with the correct person using two identifiers. Mckenzie Cooley is currently located at in her car and her husband is currently with her during visit. The provider, [name], FNP is located in their office at time of visit.  I discussed the limitations, risks, security and privacy concerns of performing an evaluation and management service by telephone and the availability of in person appointments. I also discussed with the patient that there may be a patient responsible charge related to this service. The patient expressed understanding and agreed to proceed.   History and Present Illness:  URI  This is a new problem. The current episode started in the past 7 days. The problem has been gradually worsening. The maximum temperature recorded prior to her arrival was 100.4 - 100.9 F (mailny at night). The fever has been present for Less than 1 day. Associated symptoms include congestion, coughing, headaches, rhinorrhea and sinus pain. She has tried acetaminophen for the symptoms. The treatment provided mild relief.   Covid tests negative   Review of Systems  HENT:  Positive for congestion, rhinorrhea and sinus pain.   Respiratory:  Positive for cough.   Neurological:  Positive for headaches.     Observations/Objective: Alert and oriented- answers all questions appropriately No distress Raspy voice Deep cough  Assessment and Plan: Mckenzie Cooley in today with chief complaint of URI   1. URI with cough and congestion 1. Take meds as prescribed 2. Use a cool mist humidifier especially during the winter months and when heat has been humid. 3. Use saline nose sprays frequently 4. Saline irrigations of the nose can be very helpful if done frequently.  * 4X daily for 1 week*  * Use of a nettie pot can be helpful with this. Follow directions with this* 5. Drink plenty of fluids 6. Keep thermostat turn down low 7.For any cough or congestion- robitussin DM 8. For fever or aces or pains- take tylenol or ibuprofen appropriate for age and weight.  * for fevers greater than 101 orally you may alternate ibuprofen and tylenol every  3 hours.   Meds ordered this encounter  Medications   doxycycline (VIBRA-TABS) 100 MG tablet    Sig: Take 1 tablet (100 mg total) by mouth 2 (two) times daily. 1 po bid    Dispense:  20 tablet    Refill:  0    Order Specific Question:   Supervising Provider    Answer:   Caryl Pina A [[id number]]       Follow Up Instructions: prn    I discussed the assessment and treatment plan with the patient. The patient was provided an opportunity to ask questions and all were answered. The patient agreed with the plan and demonstrated an understanding of the instructions.   The patient was advised to call back or seek an in-person evaluation if the symptoms worsen or if the condition fails to improve as anticipated.  The above assessment and management plan was discussed  with the patient. The patient verbalized understanding of and has agreed to the management plan. Patient is aware to call the clinic if symptoms persist or worsen. Patient is aware when to return to the clinic for a follow-up visit. Patient educated on when it is appropriate to go to the emergency department.   Time call ended:  9:01  I provided 14 minutes of  non face-to-face time during this encounter.    [name],  FNP

## 2022-05-04 NOTE — H&P (View-Only) (Signed)
Mckenzie Cooley, M.D. Gastroenterology & Hepatology [facility] [address] [place], [place] [address]  Primary Care Physician: Mckenzie Cooley, [place] [address]  I will communicate my assessment and recommendations to the referring MD via EMR.  Problems: Esophageal dysmotility - IEM Rectal bleeding Constipation  History of Present Illness: Mckenzie Cooley is a 31 y.o. female with past medical history of esophageal dysmotility, anemia, asthma, IBS, who presents for evaluation of constipation and rectal bleeding.  The patient was last seen on 07/08/2021 (Mckenzie Cooley). At that time, the patient was presenting some right upper quadrant pain and was advised to take dicyclomine as needed for abdominal pain.  The patient was referred for evaluation by Mckenzie Cooley at [facility].  Last time seen by his office was on 12/02/2021.  Patient was advised to take medications with yogurt or applesauce to allow adequate swallowing of tablets and to sip on liquids with meals. She had a previous esophagram showing slow emptying of the 30 mm tablet from the distal esophagus into the stomach but no standing: After 1 minute.  She also underwent endoFLIP, which showed pressure of 30 mmHg at 30 ml, compliance was 125, CSA was 34,and contractions were seen but difficult to describe secondary to the 8cm balloon . At 60m the balloon pressure was 335mg, DI 02, minimum diameter was 52m47mcompliance was 45, CSA was 45.  This was consistent with reduced distensibility with absent contractile response.  She underwent an empiric dilation with savory dilator up to 18 mm.  Most recent manometry - 05/02/21 *Notes. Motility values are mean among swallows;: Simultaneous contractions: Velocity > 8.0 cm/s; eSlv: eSleeve; 3SN, IRP, DCI, IBP - See manual definitions Supine liquid swallow interpretation : The patient has >50% with a DCI<450 but less than 70%. There is a  normal tensive normal relaxing LES. Inconclusive diagnosis of IEM Multiple rapid swallows : Normal deglutitive inhibition and adequate contractile reserve Upright liquid swallow analysis : 4 swallows with a DCI<450 and IRP<57m84mRapid drink challenge : Normal deglutitive inhibition and IRP <57mm54mabnormal impedance bolus clearance. Impedance analysis : abnormal bolus clearance for liquids. Normal esophageal impedance integral ratio Overall impression :.The diagnosis of ineffective esophageal motility is possible based on the manometry but not conclusive. The lack of contractile reserve on multiple rapid swallows and DCI< than 450 in the majority of the upright swallows is concerning and supports a diagnosis of IEM. Would correlate with bolus clearance on barium esophagram.   Patient reports that she has presented recurrent episodes of constipation for the last 2 months. She states that every time she moves her bowels, she has to push stool out with a glove (she had to do this in 5 occasions). Sometimes she has has to strain significantly to move her bowels, but sometimes stool comes out spontaneously. She has noticed occasionally some fresh blood in her stool. She has a BM every 4 days, but she used to have a BM every day in the past. States that she has abdominal bloating and epigastric abdominal pain, which improves significantly after having a bowel movement. She was taking stool softeners daily without relief. She reports feeling nauseated frequently, worse at night. She has had one pregnancy, which was a C-section.  Patient was supposed to have a colonoscopy in December with Dr. ClaytTruddie Crumbleshe [facility].  States her swallowing is the same,  she tries to chew thoroughly her food and takes usually an hour to have a meal.  Has not presented any point-episodes or require to vomit the food.  The patient denies having any  vomiting, fever, chills, melena, hematemesis, abdominal distention, diarrhea, jaundice, pruritus. She has lost 20 lb since her esophageal symptoms started, she has also noticed some change sin her appetite since constipation started.  Last EGD: as above Last Colonoscopy: never  Past Medical History: Past Medical History:  Diagnosis Date   Acid reflux    Anemia    Bloating    Childhood asthma    Esophageal dysmotility    History of blood transfusion    Positive urine pregnancy test 12/14/2018   Preeclampsia    Seasonal allergies     Past Surgical History: Past Surgical History:  Procedure Laterality Date   BIOPSY  02/26/2021   Procedure: BIOPSY;  Surgeon: Rogene Houston, MD;  [facility];  Service: Endoscopy;;  Esophageal biopsy   carpal tunnel Left 01/30/2021   CARPAL TUNNEL RELEASE Right 03/27/2021   CESAREAN SECTION N/A 01/02/2017   Procedure: CESAREAN SECTION;  Surgeon: Mckenzie Milroy, MD;  Location: [facility];  Service: Obstetrics;  Laterality: N/A;   CHOLECYSTECTOMY  2011   MckenzieByerly   ESOPHAGEAL DILATION N/A 02/26/2021   Procedure: ESOPHAGEAL DILATION;  Surgeon: Rogene Houston, MD;  [facility];  Service: Endoscopy;  Laterality: N/A;   ESOPHAGOGASTRODUODENOSCOPY N/A 10/26/2014   Procedure: ESOPHAGOGASTRODUODENOSCOPY (EGD);  Surgeon: Rogene Houston, MD;  [facility];  Service: Endoscopy;  Laterality: N/A;   ESOPHAGOGASTRODUODENOSCOPY (EGD) WITH PROPOFOL N/A 02/26/2021   Procedure: ESOPHAGOGASTRODUODENOSCOPY (EGD) WITH PROPOFOL;  Surgeon: Rogene Houston, MD;  [facility];  Service: Endoscopy;  Laterality: N/A;   POLYPECTOMY  02/26/2021   Procedure: POLYPECTOMY;  Surgeon: Rogene Houston, MD;  [facility];  Service: Endoscopy;;  Gastric Body Polyps x 3    UPPER GASTROINTESTINAL ENDOSCOPY  12/05/2009   UPPER GASTROINTESTINAL ENDOSCOPY  02/07/2009   WISDOM TOOTH EXTRACTION     WISDOM TOOTH  EXTRACTION      Family History: Family History  Problem Relation Age of Onset   Kidney disease Maternal Grandmother    Renal Disease Maternal Grandmother    Heart disease Father    Hypertension Mother    Cancer - Cervical Maternal Aunt     Social History: Social History   Tobacco Use  Smoking Status Former   Packs/day: 0.50   Years: 12.00   Total pack years: 6.00   Types: Cigarettes   Quit date: 05/05/2016   Years since quitting: 6.0  Smokeless Tobacco Never   Social History   Substance and Sexual Activity  Alcohol Use No   Alcohol/week: 0.0 standard drinks of alcohol   Social History   Substance and Sexual Activity  Drug Use No    Allergies: Allergies  Allergen Reactions   Percocet [Oxycodone-Acetaminophen] Hives and Itching   Prilosec [Omeprazole] Other (See Comments)    Stomach cramp   Amoxicillin Rash    Has patient had a PCN reaction causing immediate rash, facial/tongue/throat swelling, SOB or lightheadedness with hypotension: Yes -mild rash Has patient had a PCN reaction causing severe rash involving mucus membranes or skin necrosis: No Has patient had a PCN reaction that required hospitalization: No Has patient had a PCN reaction occurring within the last 10 years: Yes If all of the above answers are "NO", then may proceed with Cephalosporin  use. Has tolerated ceftriaxone & cephalexin     Medications: Current Outpatient Medications  Medication Sig Dispense Refill   acetaminophen (TYLENOL) 500 MG tablet Take 500 mg by mouth every 6 (six) hours as needed.     cetirizine HCl (ZYRTEC) 5 MG/5ML SOLN Take 10 mLs (10 mg total) by mouth daily. 300 mL 0   dicyclomine (BENTYL) 10 MG capsule Take 1 capsule (10 mg total) by mouth 4 (four) times daily -  before meals and at bedtime. 120 capsule 5   doxycycline (VIBRA-TABS) 100 MG tablet Take 1 tablet (100 mg total) by mouth 2 (two) times daily. 1 po bid 20 tablet 0   esomeprazole (NEXIUM) 20 MG capsule TAKE ONE  CAPSULE ONCE DAILY BEFORE BREAKFAST 30 capsule 12   famotidine (PEPCID) 40 MG tablet Take 40 mg by mouth 2 (two) times daily.     ondansetron (ZOFRAN) 8 MG tablet TAKE ONE TABLET EVERY 8 HOURS AS NEEDED FOR NAUSEA AND VOMITING 30 tablet 0   promethazine-dextromethorphan (PROMETHAZINE-DM) 6.25-15 MG/5ML syrup Take 5 mLs by mouth 4 (four) times daily as needed for cough. 118 mL 0   No current facility-administered medications for this visit.    Review of Systems: GENERAL: negative for malaise, night sweats HEENT: No changes in hearing or vision, no nose bleeds or other nasal problems. NECK: Negative for lumps, goiter, pain and significant neck swelling RESPIRATORY: Negative for cough, wheezing CARDIOVASCULAR: Negative for chest pain, leg swelling, palpitations, orthopnea GI: SEE HPI MUSCULOSKELETAL: Negative for joint pain or swelling, back pain, and muscle pain. SKIN: Negative for lesions, rash PSYCH: Negative for sleep disturbance, mood disorder and recent psychosocial stressors. HEMATOLOGY Negative for prolonged bleeding, bruising easily, and swollen nodes. ENDOCRINE: Negative for cold or heat intolerance, polyuria, polydipsia and goiter. NEURO: negative for tremor, gait imbalance, syncope and seizures. The remainder of the review of systems is noncontributory.   Physical Exam: BP 120/82 (BP Location: Left Arm, Patient Position: Sitting, Cuff Size: Large)   Pulse 62   Temp 97.8 F (36.6 C) (Temporal)   Ht 5' (1.524 m)   Wt 223 lb 14.4 oz (101.6 kg)   BMI 43.73 kg/m  GENERAL: The patient is AO x3, in no acute distress. HEENT: Head is normocephalic and atraumatic. EOMI are intact. Mouth is well hydrated and without lesions. NECK: Supple. No masses LUNGS: Clear to auscultation. No presence of rhonchi/wheezing/rales. Adequate chest expansion HEART: RRR, normal s1 and s2. ABDOMEN: mildly tender diffusely, no guarding, no peritoneal signs, and nondistended. BS +. No masses. RECTAL  EXAM: presence of small external hemorrhoids, normal tone, decrease squeeze when straining, no masses, brown stool without blood. Chaperone: Teaching laboratory technician EXTREMITIES: Without any cyanosis, clubbing, rash, lesions or edema. NEUROLOGIC: AOx3, no focal motor deficit. SKIN: no jaundice, no rashes  Imaging/Labs: as above  I personally reviewed and interpreted the available labs, imaging and endoscopic files.  Impression and Plan: AASHA DINA is a 31 y.o. female with past medical history of esophageal dysmotility, anemia, asthma, IBS, who presents for evaluation of constipation and rectal bleeding.  Patient has presented for new onset of episodes of constipation which are different from her usual bowel movement frequency.  She used to have diarrhea in the past but now is presenting constipation with significant abdominal discomfort that is.  Given her history of IEM, it is possible that she has motility issues in other parts of her bowel.  Her rectal exam was suggestive of decreased perianal descend. At this moment, we will  start her on Linzess 145 mcg every day but if this does not improve her symptoms may consider further evaluation with an anorectal manometry.  Given the presence of rectal bleeding, I agree she needs to have further evaluation with a colonoscopy -patient requested to schedule this procedure our hospital and she will reach [facility] to cancel her procedure.  In terms of her dysphagia, she should continue with the current dietary/lifestyle modifications for management of IEM.  - Schedule colonoscopy - Start Linzess 145 mcg qday - Continue with dietary modifications and swallowing water with meals - Take medications with applesauce - Follow up with Mckenzie Cooley regarding dysphagia  All questions were answered.      Mckenzie Peppers, MD Gastroenterology and Hepatology [facility]

## 2022-05-19 ENCOUNTER — Other Ambulatory Visit (HOSPITAL_COMMUNITY)
Admission: RE | Admit: 2022-05-19 | Discharge: 2022-05-19 | Disposition: A | Discharge status: Home / Self Care | Payer: Medicaid Other | Source: Ambulatory Visit | Attending: Gastroenterology | Admitting: Gastroenterology

## 2022-05-19 DIAGNOSIS — K625 Hemorrhage of anus and rectum: Secondary | ICD-10-CM

## 2022-05-19 LAB — PREGNANCY, URINE: Preg Test, Ur: NEGATIVE

## 2022-05-21 ENCOUNTER — Encounter (HOSPITAL_COMMUNITY)
Admission: RE | Disposition: A | Discharge status: Home / Self Care | Payer: Self-pay | Source: Home / Self Care | Attending: Gastroenterology

## 2022-05-21 ENCOUNTER — Encounter (HOSPITAL_COMMUNITY): Payer: Self-pay | Admitting: Gastroenterology

## 2022-05-21 ENCOUNTER — Ambulatory Visit (HOSPITAL_COMMUNITY): Payer: Medicaid Other | Admitting: Anesthesiology

## 2022-05-21 ENCOUNTER — Other Ambulatory Visit: Payer: Self-pay

## 2022-05-21 ENCOUNTER — Ambulatory Visit (HOSPITAL_BASED_OUTPATIENT_CLINIC_OR_DEPARTMENT_OTHER): Payer: Medicaid Other | Admitting: Anesthesiology

## 2022-05-21 ENCOUNTER — Ambulatory Visit (HOSPITAL_COMMUNITY)
Admission: RE | Admit: 2022-05-21 | Discharge: 2022-05-21 | Disposition: A | Discharge status: Home / Self Care | Payer: Medicaid Other | Attending: Gastroenterology | Admitting: Gastroenterology

## 2022-05-21 DIAGNOSIS — Z87891 Personal history of nicotine dependence: Secondary | ICD-10-CM | POA: Insufficient documentation

## 2022-05-21 DIAGNOSIS — K644 Residual hemorrhoidal skin tags: Secondary | ICD-10-CM

## 2022-05-21 DIAGNOSIS — I10 Essential (primary) hypertension: Secondary | ICD-10-CM | POA: Insufficient documentation

## 2022-05-21 DIAGNOSIS — F418 Other specified anxiety disorders: Secondary | ICD-10-CM | POA: Diagnosis not present

## 2022-05-21 DIAGNOSIS — K219 Gastro-esophageal reflux disease without esophagitis: Secondary | ICD-10-CM | POA: Insufficient documentation

## 2022-05-21 DIAGNOSIS — K648 Other hemorrhoids: Secondary | ICD-10-CM

## 2022-05-21 DIAGNOSIS — K625 Hemorrhage of anus and rectum: Secondary | ICD-10-CM

## 2022-05-21 DIAGNOSIS — K921 Melena: Secondary | ICD-10-CM | POA: Insufficient documentation

## 2022-05-21 DIAGNOSIS — Z79899 Other long term (current) drug therapy: Secondary | ICD-10-CM | POA: Diagnosis not present

## 2022-05-21 DIAGNOSIS — K589 Irritable bowel syndrome without diarrhea: Secondary | ICD-10-CM | POA: Insufficient documentation

## 2022-05-21 DIAGNOSIS — J45909 Unspecified asthma, uncomplicated: Secondary | ICD-10-CM | POA: Diagnosis not present

## 2022-05-21 HISTORY — PX: COLONOSCOPY WITH PROPOFOL: SHX5780

## 2022-05-21 SURGERY — COLONOSCOPY WITH PROPOFOL
Anesthesia: General

## 2022-05-21 MED ORDER — LINACLOTIDE 72 MCG PO CAPS: 72.0000 ug | ORAL_CAPSULE | Freq: Every day | ORAL | 1 refills | Status: DC

## 2022-05-21 MED ORDER — LIDOCAINE HCL (CARDIAC) PF 100 MG/5ML IV SOSY
PREFILLED_SYRINGE | INTRAVENOUS | Status: DC | PRN
  Administered 2022-05-21: 50 mg via INTRAVENOUS

## 2022-05-21 MED ORDER — PROPOFOL 10 MG/ML IV BOLUS
INTRAVENOUS | Status: DC | PRN
  Administered 2022-05-21: 100 mg via INTRAVENOUS

## 2022-05-21 MED ORDER — LACTATED RINGERS IV SOLN: INTRAVENOUS | Status: DC

## 2022-05-21 MED ORDER — PROPOFOL 500 MG/50ML IV EMUL
INTRAVENOUS | Status: DC | PRN
  Administered 2022-05-21: 150 ug/kg/min via INTRAVENOUS

## 2022-05-21 NOTE — Anesthesia Postprocedure Evaluation (Signed)
Anesthesia Post Note  Patient: Mckenzie Cooley  Procedure(s) Performed: COLONOSCOPY WITH PROPOFOL  Patient location during evaluation: Phase II Anesthesia Type: General Level of consciousness: awake and alert and oriented Pain management: pain level controlled Vital Signs Assessment: post-procedure vital signs reviewed and stable Respiratory status: spontaneous breathing, nonlabored ventilation and respiratory function stable Cardiovascular status: blood pressure returned to baseline and stable Postop Assessment: no apparent nausea or vomiting Anesthetic complications: no  No notable events documented.   Last Vitals:  Vitals:   05/21/22 1316 05/21/22 1422  BP: 130/89 116/73  Pulse: 64 69  Resp: 20 20  Temp: 36.6 C 36.5 C  SpO2: 99% 99%    Last Pain:  Vitals:   05/21/22 1422  TempSrc: Axillary  PainSc: 0-No pain                 Diora Bellizzi C Nyasha Rahilly

## 2022-05-21 NOTE — Progress Notes (Signed)
Please excuse Mckenzie Cooley from work on 05/21/2022 through Friday 05/22/2022 at 2:00pm.  Mckenzie Cooley should not sign legal documents, drive, or operate machinery for 24 hours.  Mckenzie Cooley can resume usual activities after 2:00pm on 05/22/2022.

## 2022-05-21 NOTE — Discharge Instructions (Signed)
You are being discharged to home.  Resume your previous diet.  Your physician has recommended a repeat colonoscopy at age 31 (please contact the clinic prior to your 45th birthday to schedule) for screening purposes.  Decrease Linzess to 72 mcg qday.

## 2022-05-21 NOTE — Op Note (Signed)
[facility] Patient Name: Mckenzie Cooley Procedure Date: 05/21/2022 1:44 PM MRN: [id number] Date of Birth: 07/07/1990 Attending MD: Maylon Peppers , , [id number] CSN: [id number] Age: 31 Admit Type: Outpatient Procedure:                Colonoscopy Indications:              Rectal bleeding Providers:                Maylon Peppers, Crystal Page, Everardo Pacific Referring MD:              Medicines:                Monitored Anesthesia Care Complications:            No immediate complications. Estimated Blood Loss:     Estimated blood loss: none. Procedure:                Pre-Anesthesia Assessment:                           - Prior to the procedure, a History and Physical                            was performed, and patient medications, allergies                            and sensitivities were reviewed. The patient's                            tolerance of previous anesthesia was reviewed.                           - The risks and benefits of the procedure and the                            sedation options and risks were discussed with the                            patient. All questions were answered and informed                            consent was obtained.                           - ASA Grade Assessment: II - A patient with mild                            systemic disease.                           After obtaining informed consent, the colonoscope                            was passed under direct vision. Throughout the                            procedure, the patient's blood pressure, pulse, and  oxygen saturations were monitored continuously. The                            [id number]-[id number] ([id number]) scope was introduced through                            the anus and advanced to the the terminal ileum.                            The colonoscopy was performed without difficulty.                            The patient tolerated the procedure well.  The                            quality of the bowel preparation was excellent. Scope In: 2:00:05 PM Scope Out: 2:18:24 PM Scope Withdrawal Time: 0 hours 14 minutes 24 seconds  Total Procedure Duration: 0 hours 18 minutes 19 seconds  Findings:      Skin tags were found on perianal exam.      The terminal ileum appeared normal.      The colon (entire examined portion) appeared normal.      Non-bleeding internal hemorrhoids were found during retroflexion. The       hemorrhoids were small. Impression:               - Perianal skin tags found on perianal exam.                           - The examined portion of the ileum was normal.                           - The entire examined colon is normal.                           - Non-bleeding internal hemorrhoids.                           - No specimens collected. Moderate Sedation:      Per Anesthesia Care Recommendation:           - Discharge patient to home (ambulatory).                           - Resume previous diet.                           - Repeat colonoscopy at age 71 for screening                            purposes.                           - Decrease Linzess to 72 mcg qday. Procedure Code(s):        --- Professional ---  45378, Colonoscopy, flexible; diagnostic, including                            collection of specimen(s) by brushing or washing,                            when performed (separate procedure) Diagnosis Code(s):        --- Professional ---                           K64.8, Other hemorrhoids                           K64.4, Residual hemorrhoidal skin tags                           K62.5, Hemorrhage of anus and rectum CPT copyright 2022 American Medical Association. All rights reserved. The codes documented in this report are preliminary and upon coder review may  be revised to meet current compliance requirements. Maylon Peppers, MD Maylon Peppers,  05/21/2022 2:30:37 PM This  report has been signed electronically. Number of Addenda: 0

## 2022-05-21 NOTE — Interval H&P Note (Signed)
History and Physical Interval Note:  05/21/2022 1:43 PM  Mckenzie Cooley  has presented today for surgery, with the diagnosis of rectal bleeding.  The various methods of treatment have been discussed with the patient and family. After consideration of risks, benefits and other options for treatment, the patient has consented to  Procedure(s) with comments: COLONOSCOPY WITH PROPOFOL (N/A) - 2:00pm, asa 2 as a surgical intervention.  The patient's history has been reviewed, patient examined, no change in status, stable for surgery.  I have reviewed the patient's chart and labs.  Questions were answered to the patient's satisfaction.     [name]

## 2022-05-21 NOTE — Transfer of Care (Signed)
Immediate Anesthesia Transfer of Care Note  Patient: Mckenzie Cooley  Procedure(s) Performed: COLONOSCOPY WITH PROPOFOL  Patient Location: Short Stay  Anesthesia Type:General  Level of Consciousness: awake, alert , and patient cooperative  Airway & Oxygen Therapy: Patient Spontanous Breathing  Post-op Assessment: Report given to RN, Post -op Vital signs reviewed and stable, and Patient moving all extremities X 4  Post vital signs: Reviewed and stable  Last Vitals:  Vitals Value Taken Time  BP 116/73 05/21/22 1422  Temp 36.5 C 05/21/22 1422  Pulse 69 05/21/22 1422  Resp 20 05/21/22 1422  SpO2 99 % 05/21/22 1422    Last Pain:  Vitals:   05/21/22 1422  TempSrc: Axillary  PainSc: 0-No pain      Patients Stated Pain Goal: 2 (59/92/34 1443)  Complications: No notable events documented.

## 2022-05-21 NOTE — Anesthesia Preprocedure Evaluation (Addendum)
Anesthesia Evaluation  Patient identified by MRN, date of birth, ID band Patient awake    Reviewed: Allergy & Precautions, H&P , NPO status , Patient's Chart, lab work & pertinent test results  Airway Mallampati: II  TM Distance: >3 FB Neck ROM: Full    Dental  (+) Dental Advisory Given, Teeth Intact   Pulmonary asthma , former smoker   Pulmonary exam normal breath sounds clear to auscultation       Cardiovascular hypertension, Pt. on medications Normal cardiovascular exam Rhythm:Regular Rate:Normal     Neuro/Psych  Headaches PSYCHIATRIC DISORDERS Anxiety Depression     Neuromuscular disease    GI/Hepatic Neg liver ROS,GERD  Medicated and Controlled,,  Endo/Other  negative endocrine ROS    Renal/GU negative Renal ROS  negative genitourinary   Musculoskeletal negative musculoskeletal ROS (+)    Abdominal   Peds  (+) ADHD Hematology  (+) Blood dyscrasia, anemia   Anesthesia Other Findings   Reproductive/Obstetrics negative OB ROS                              Anesthesia Physical Anesthesia Plan  ASA: 3  Anesthesia Plan: General   Post-op Pain Management: Minimal or no pain anticipated   Induction: Intravenous  PONV Risk Score and Plan: 1 and Propofol infusion  Airway Management Planned: Nasal Cannula and Natural Airway  Additional Equipment:   Intra-op Plan:   Post-operative Plan:   Informed Consent: I have reviewed the patients History and Physical, chart, labs and discussed the procedure including the risks, benefits and alternatives for the proposed anesthesia with the patient or authorized representative who has indicated his/her understanding and acceptance.     Dental advisory given  Plan Discussed with: CRNA and Surgeon  Anesthesia Plan Comments:         Anesthesia Quick Evaluation  

## 2022-05-22 ENCOUNTER — Encounter: Payer: Self-pay | Admitting: Nurse Practitioner

## 2022-05-22 ENCOUNTER — Ambulatory Visit (INDEPENDENT_AMBULATORY_CARE_PROVIDER_SITE_OTHER): Payer: Medicaid Other | Admitting: Nurse Practitioner

## 2022-05-22 DIAGNOSIS — J069 Acute upper respiratory infection, unspecified: Secondary | ICD-10-CM

## 2022-05-22 DIAGNOSIS — R509 Fever, unspecified: Secondary | ICD-10-CM | POA: Diagnosis not present

## 2022-05-22 LAB — RSV AG, IMMUNOCHR, WAIVED: RSV Ag, Immunochr, Waived: NEGATIVE

## 2022-05-22 MED ORDER — FLUTICASONE PROPIONATE 50 MCG/ACT NA SUSP: 2.0000 | Freq: Every day | NASAL | 6 refills | Status: DC

## 2022-05-22 MED ORDER — PSEUDOEPH-BROMPHEN-DM 30-2-10 MG/5ML PO SYRP: 5.0000 mL | ORAL_SOLUTION | Freq: Four times a day (QID) | ORAL | 0 refills | Status: DC | PRN

## 2022-05-22 NOTE — Progress Notes (Signed)
   Virtual Visit  Note Due to COVID-19 pandemic this visit was conducted virtually. This visit type was conducted due to national recommendations for restrictions regarding the COVID-19 Pandemic (e.g. social distancing, sheltering in place) in an effort to limit this patient's exposure and mitigate transmission in our community. All issues noted in this document were discussed and addressed.  A physical exam was not performed with this format.  I connected with Mckenzie Cooley on 05/22/22 at 09:45 am  by telephone and verified that I am speaking with the correct person using two identifiers. Mckenzie Cooley is currently located at home during visit. The provider, Ivy Lynn, NP is located in their office at time of visit.  I discussed the limitations, risks, security and privacy concerns of performing an evaluation and management service by telephone and the availability of in person appointments. I also discussed with the patient that there may be a patient responsible charge related to this service. The patient expressed understanding and agreed to proceed.   History and Present Illness:  URI  This is a new problem. The current episode started yesterday. The problem has been unchanged. There has been no fever. Associated symptoms include congestion, coughing and headaches. Pertinent negatives include no rash. She has tried nothing for the symptoms. The treatment provided no relief.  Patient exposed to positive RSV.    Review of Systems  Constitutional:  Positive for malaise/fatigue. Negative for chills and fever.  HENT:  Positive for congestion.   Respiratory:  Positive for cough.   Skin: Negative.  Negative for rash.  Neurological:  Positive for headaches.  All other systems reviewed and are negative.    Observations/Objective: Telemetry visit patient not in distress  Assessment and Plan: Patient presents with upper respiratory infection, exposed to positive RSV .  Advised  patient to Take meds as prescribed - Use a cool mist humidifier  -Use saline nose sprays frequently -Force fluids -For fever or aches or pains- take Tylenol or ibuprofen. -If symptoms do not improve, she may need to be COVID tested to rule this out Follow up with worsening unresolved symptoms   Follow Up Instructions: Follow-up with unresolved symptoms.    I discussed the assessment and treatment plan with the patient. The patient was provided an opportunity to ask questions and all were answered. The patient agreed with the plan and demonstrated an understanding of the instructions.   The patient was advised to call back or seek an in-person evaluation if the symptoms worsen or if the condition fails to improve as anticipated.  The above assessment and management plan was discussed with the patient. The patient verbalized understanding of and has agreed to the management plan. Patient is aware to call the clinic if symptoms persist or worsen. Patient is aware when to return to the clinic for a follow-up visit. Patient educated on when it is appropriate to go to the emergency department.   Time call ended: 9:56 AM  I provided 11 minutes of  non face-to-face time during this encounter.    Ivy Lynn, NP

## 2022-05-22 NOTE — Patient Instructions (Signed)

## 2022-05-24 LAB — COVID-19, FLU A+B AND RSV
Influenza A, NAA: NOT DETECTED
Influenza B, NAA: NOT DETECTED
RSV, NAA: NOT DETECTED
SARS-CoV-2, NAA: NOT DETECTED

## 2022-05-26 ENCOUNTER — Encounter (HOSPITAL_COMMUNITY): Payer: Self-pay | Admitting: Gastroenterology

## 2022-07-09 ENCOUNTER — Encounter: Payer: Self-pay | Admitting: Family Medicine

## 2022-07-09 ENCOUNTER — Ambulatory Visit (INDEPENDENT_AMBULATORY_CARE_PROVIDER_SITE_OTHER): Payer: Medicaid Other | Admitting: Family Medicine

## 2022-07-09 VITALS — BP 137/83 | HR 89 | Temp 98.1°F | Ht 61.0 in | Wt 220.2 lb

## 2022-07-09 DIAGNOSIS — J392 Other diseases of pharynx: Secondary | ICD-10-CM

## 2022-07-09 DIAGNOSIS — L2389 Allergic contact dermatitis due to other agents: Secondary | ICD-10-CM

## 2022-07-09 MED ORDER — PREDNISONE 20 MG PO TABS: 40.0000 mg | ORAL_TABLET | Freq: Every day | ORAL | 0 refills | Status: AC

## 2022-07-09 MED ORDER — TRIAMCINOLONE ACETONIDE 0.1 % EX CREA: 1.0000 | TOPICAL_CREAM | Freq: Two times a day (BID) | CUTANEOUS | 0 refills | Status: DC

## 2022-07-09 NOTE — Progress Notes (Signed)
Subjective:  Patient ID: Mckenzie Cooley, female    DOB: 18-Jul-1990, 32 y.o.   MRN: [id number]  Patient Care Team: Gwenlyn Perking, FNP as PCP - General (Family Medicine)   Chief Complaint:  white spots in throat (Patient states if she eats anything with red dye she gets spots in her throat that burns. Has been going on for a few months. ) and Rash (Right arm rash that has been there for a few months. States it itches. OTC medication is not working )   HPI: Mckenzie Cooley is a 32 y.o. female presenting on 07/09/2022 for white spots in throat (Patient states if she eats anything with red dye she gets spots in her throat that burns. Has been going on for a few months. ) and Rash (Right arm rash that has been there for a few months. States it itches. OTC medication is not working )   Pt presents today with complaints of intermittent pruritic rash to right arm and irritation to her throat over the last several months. States this seems to be triggered by certain foods such as red dye. States her throat will get better and then the irritation will return. Burning to pruritic in nature. Denies trouble breathing, throat swelling, dysphagia, stridor, cough, wheezing, or shortness of breath. She has tried over the counter creams for her arm without relief.   Rash This is a recurrent problem. The problem has been waxing and waning since onset. The affected locations include the right arm. The rash is characterized by itchiness, dryness, scaling and redness. Associated symptoms include a sore throat. Pertinent negatives include no anorexia, congestion, cough, diarrhea, eye pain, facial edema, fatigue, fever, joint pain, nail changes, rhinorrhea, shortness of breath or vomiting. Past treatments include anti-itch cream, topical steroids and moisturizer. The treatment provided no relief.    Relevant past medical, surgical, family, and social history reviewed and updated as indicated.  Allergies and  medications reviewed and updated. Data reviewed: Chart in Epic.   Past Medical History:  Diagnosis Date   Acid reflux    Anemia    Bloating    Childhood asthma    Esophageal dysmotility    History of blood transfusion    Positive urine pregnancy test 12/14/2018   Preeclampsia    Seasonal allergies     Past Surgical History:  Procedure Laterality Date   BIOPSY  02/26/2021   Procedure: BIOPSY;  Surgeon: Rogene Houston, MD;  Location: [facility];  Service: Endoscopy;;  Esophageal biopsy   carpal tunnel Left 01/30/2021   CARPAL TUNNEL RELEASE Right 03/27/2021   CESAREAN SECTION N/A 01/02/2017   Procedure: CESAREAN SECTION;  Surgeon: Chancy Milroy, MD;  Location: [facility];  Service: Obstetrics;  Laterality: N/A;   CHOLECYSTECTOMY  2011   Dr.Byerly   COLONOSCOPY WITH PROPOFOL N/A 05/21/2022   Procedure: COLONOSCOPY WITH PROPOFOL;  Surgeon: Harvel Quale, MD;  Location: [facility];  Service: Gastroenterology;  Laterality: N/A;  2:00pm, asa 2   ESOPHAGEAL DILATION N/A 02/26/2021   Procedure: ESOPHAGEAL DILATION;  Surgeon: Rogene Houston, MD;  Location: [facility];  Service: Endoscopy;  Laterality: N/A;   ESOPHAGOGASTRODUODENOSCOPY N/A 10/26/2014   Procedure: ESOPHAGOGASTRODUODENOSCOPY (EGD);  Surgeon: Rogene Houston, MD;  Location: [facility];  Service: Endoscopy;  Laterality: N/A;   ESOPHAGOGASTRODUODENOSCOPY (EGD) WITH PROPOFOL N/A 02/26/2021   Procedure: ESOPHAGOGASTRODUODENOSCOPY (EGD) WITH PROPOFOL;  Surgeon: Rogene Houston, MD;  Location: [facility];  Service: Endoscopy;  Laterality: N/A;   POLYPECTOMY  02/26/2021   Procedure: POLYPECTOMY;  Surgeon: Rogene Houston, MD;  Location: [facility];  Service: Endoscopy;;  Gastric Body Polyps x 3    UPPER GASTROINTESTINAL ENDOSCOPY  12/05/2009   UPPER GASTROINTESTINAL ENDOSCOPY  02/07/2009   WISDOM TOOTH EXTRACTION     WISDOM TOOTH EXTRACTION      Social History   Socioeconomic  History   Marital status: Married    Spouse name: Not on file   Number of children: 1   Years of education: Not on file   Highest education level: Not on file  Occupational History   Not on file  Tobacco Use   Smoking status: Former    Packs/day: 0.50    Years: 12.00    Total pack years: 6.00    Types: Cigarettes    Quit date: 05/05/2016    Years since quitting: 6.1   Smokeless tobacco: Never  Vaping Use   Vaping Use: Never used  Substance and Sexual Activity   Alcohol use: No    Alcohol/week: 0.0 standard drinks of alcohol   Drug use: No   Sexual activity: Yes    Birth control/protection: Condom  Other Topics Concern   Not on file  Social History Narrative   Not on file   Social Determinants of Health   Financial Resource Strain: Low Risk  (02/06/2021)   Overall Financial Resource Strain (CARDIA)    Difficulty of Paying Living Expenses: Not hard at all  Food Insecurity: No Food Insecurity (02/06/2021)   Hunger Vital Sign    Worried About Running Out of Food in the Last Year: Never true    Ran Out of Food in the Last Year: Never true  Transportation Needs: No Transportation Needs (02/06/2021)   PRAPARE - Hydrologist (Medical): No    Lack of Transportation (Non-Medical): No  Physical Activity: Inactive (02/06/2021)   Exercise Vital Sign    Days of Exercise per Week: 0 days    Minutes of Exercise per Session: 0 min  Stress: No Stress Concern Present (02/06/2021)   [place]    Feeling of Stress : Not at all  Social Connections: Moderately Integrated (02/06/2021)   Social Connection and Isolation Panel [NHANES]    Frequency of Communication with Friends and Family: More than three times a week    Frequency of Social Gatherings with Friends and Family: Once a week    Attends Religious Services: 1 to 4 times per year    Active Member of [employer] or Organizations: No    Attends  Archivist Meetings: Never    Marital Status: Married  Human resources officer Violence: Not At Risk (02/06/2021)   Humiliation, Afraid, Rape, and Kick questionnaire    Fear of Current or Ex-Partner: No    Emotionally Abused: No    Physically Abused: No    Sexually Abused: No    Outpatient Encounter Medications as of 07/09/2022  Medication Sig   acetaminophen (TYLENOL) 500 MG tablet Take 1,000 mg by mouth every 8 (eight) hours as needed for moderate pain. TAKES IN LIQUID FORM   dicyclomine (BENTYL) 10 MG capsule Take 1 capsule (10 mg total) by mouth 4 (four) times daily -  before meals and at bedtime. (Patient taking differently: Take 10 mg by mouth daily.)   esomeprazole (NEXIUM) 20 MG capsule TAKE ONE CAPSULE ONCE DAILY BEFORE BREAKFAST   famotidine (  PEPCID) 40 MG tablet Take 40 mg by mouth daily.   linaclotide (LINZESS) 72 MCG capsule Take 1 capsule (72 mcg total) by mouth daily before breakfast.   ondansetron (ZOFRAN) 8 MG tablet TAKE ONE TABLET EVERY 8 HOURS AS NEEDED FOR NAUSEA AND VOMITING   predniSONE (DELTASONE) 20 MG tablet Take 2 tablets (40 mg total) by mouth daily with breakfast for 5 days.   triamcinolone cream (KENALOG) 0.1 % Apply 1 Application topically 2 (two) times daily.   [DISCONTINUED] brompheniramine-pseudoephedrine-DM 30-2-10 MG/5ML syrup Take 5 mLs by mouth 4 (four) times daily as needed.   [DISCONTINUED] doxycycline (VIBRA-TABS) 100 MG tablet Take 1 tablet (100 mg total) by mouth 2 (two) times daily. 1 po bid (Patient not taking: Reported on 05/20/2022)   [DISCONTINUED] fluticasone (FLONASE) 50 MCG/ACT nasal spray Place 2 sprays into both nostrils daily.   No facility-administered encounter medications on file as of 07/09/2022.    Allergies  Allergen Reactions   Percocet [Oxycodone-Acetaminophen] Hives and Itching   Prilosec [Omeprazole] Other (See Comments)    Stomach cramp   Amoxicillin Rash    Has patient had a PCN reaction causing immediate rash,  facial/tongue/throat swelling, SOB or lightheadedness with hypotension: Yes -mild rash Has patient had a PCN reaction causing severe rash involving mucus membranes or skin necrosis: No Has patient had a PCN reaction that required hospitalization: No Has patient had a PCN reaction occurring within the last 10 years: Yes If all of the above answers are "NO", then may proceed with Cephalosporin use. Has tolerated ceftriaxone & cephalexin     Review of Systems  Constitutional:  Negative for activity change, appetite change, chills, diaphoresis, fatigue, fever and unexpected weight change.  HENT:  Positive for sore throat. Negative for congestion, dental problem, drooling, ear discharge, ear pain, facial swelling, hearing loss, mouth sores, nosebleeds, postnasal drip, rhinorrhea, sinus pressure, sinus pain, sneezing, tinnitus, trouble swallowing and voice change.   Eyes: Negative.  Negative for photophobia, pain and visual disturbance.  Respiratory:  Negative for apnea, cough, choking, chest tightness, shortness of breath, wheezing and stridor.   Cardiovascular:  Negative for chest pain, palpitations and leg swelling.  Gastrointestinal:  Negative for abdominal pain, anorexia, blood in stool, constipation, diarrhea, nausea and vomiting.  Endocrine: Negative.   Genitourinary:  Negative for decreased urine volume, difficulty urinating, dysuria, frequency and urgency.  Musculoskeletal:  Negative for arthralgias, joint pain and myalgias.  Skin:  Positive for color change and rash. Negative for nail changes, pallor and wound.  Allergic/Immunologic: Negative.   Neurological:  Negative for dizziness, tremors, seizures, syncope, facial asymmetry, speech difficulty, weakness, light-headedness, numbness and headaches.  Hematological: Negative.   Psychiatric/Behavioral:  Negative for confusion, hallucinations, sleep disturbance and suicidal ideas.   All other systems reviewed and are negative.        Objective:  BP 137/83   Pulse 89   Temp 98.1 F (36.7 C) (Temporal)   Ht '5\' 1"'$  (1.549 m)   Wt 220 lb 3.2 oz (99.9 kg)   LMP 06/26/2021   SpO2 98%   BMI 41.61 kg/m    Wt Readings from Last 3 Encounters:  07/09/22 220 lb 3.2 oz (99.9 kg)  05/21/22 223 lb 14.4 oz (101.6 kg)  05/04/22 223 lb 14.4 oz (101.6 kg)    Physical Exam Vitals and nursing note reviewed.  Constitutional:      General: She is not in acute distress.    Appearance: Normal appearance. She is well-developed and well-groomed. She is obese.  She is not ill-appearing, toxic-appearing or diaphoretic.  HENT:     Head: Normocephalic and atraumatic.     Jaw: There is normal jaw occlusion.     Right Ear: Hearing normal.     Left Ear: Hearing normal.     Nose: Nose normal.     Mouth/Throat:     Lips: Pink.     Mouth: Mucous membranes are moist. No oral lesions.     Dentition: No gum lesions.     Tongue: No lesions. Tongue does not deviate from midline.     Palate: No mass and lesions.     Pharynx: Oropharynx is clear. Uvula midline. No pharyngeal swelling, oropharyngeal exudate, posterior oropharyngeal erythema or uvula swelling.     Tonsils: No tonsillar exudate or tonsillar abscesses.     Comments: No lesions or erythema noted.  Eyes:     General: Lids are normal.     Extraocular Movements: Extraocular movements intact.     Conjunctiva/sclera: Conjunctivae normal.     Pupils: Pupils are equal, round, and reactive to light.  Neck:     Thyroid: No thyroid mass, thyromegaly or thyroid tenderness.     Vascular: No carotid bruit or JVD.     Trachea: Trachea and phonation normal.  Cardiovascular:     Rate and Rhythm: Normal rate and regular rhythm.     Chest Wall: PMI is not displaced.     Pulses: Normal pulses.     Heart sounds: Normal heart sounds. No murmur heard.    No friction rub. No gallop.  Pulmonary:     Effort: Pulmonary effort is normal. No respiratory distress.     Breath sounds: Normal breath  sounds. No stridor. No wheezing, rhonchi or rales.  Chest:     Chest wall: No tenderness.  Abdominal:     General: Bowel sounds are normal. There is no distension or abdominal bruit.     Palpations: Abdomen is soft. There is no hepatomegaly or splenomegaly.     Tenderness: There is no abdominal tenderness. There is no right CVA tenderness or left CVA tenderness.     Hernia: No hernia is present.  Musculoskeletal:        General: Normal range of motion.     Cervical back: Normal range of motion and neck supple.     Right lower leg: No edema.     Left lower leg: No edema.  Lymphadenopathy:     Cervical: No cervical adenopathy.  Skin:    General: Skin is warm and dry.     Capillary Refill: Capillary refill takes less than 2 seconds.     Coloration: Skin is not cyanotic, jaundiced or pale.     Findings: Erythema and rash present. Rash is crusting and scaling.       Neurological:     General: No focal deficit present.     Mental Status: She is alert and oriented to person, place, and time.     Sensory: Sensation is intact.     Motor: Motor function is intact.     Coordination: Coordination is intact.     Gait: Gait is intact.     Deep Tendon Reflexes: Reflexes are normal and symmetric.  Psychiatric:        Attention and Perception: Attention and perception normal.        Mood and Affect: Mood and affect normal.        Speech: Speech normal.        Behavior:  Behavior normal. Behavior is cooperative.        Thought Content: Thought content normal.        Cognition and Memory: Cognition and memory normal.        Judgment: Judgment normal.     Results for orders placed or performed in visit on 05/22/22  COVID-19, Flu A+B and RSV   Specimen: Nasopharyngeal(NP) swabs in vial transport medium  Result Value Ref Range   SARS-CoV-2, NAA Not Detected Not Detected   Influenza A, NAA Not Detected Not Detected   Influenza B, NAA Not Detected Not Detected   RSV, NAA Not Detected Not  Detected   Test Information: Comment   RSV Ag, Immunochr, Waived   Specimen: Nasopharyngeal   Naso  Result Value Ref Range   RSV Ag, Immunochr, Waived Negative Negative       Pertinent labs & imaging results that were available during my care of the patient were reviewed by me and considered in my medical decision making.  Assessment & Plan:  Brinnley was seen today for white spots in throat and rash.  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 Will refer to allergist for testing. Oral and topical medications as prescribed. On red flags concerning for anaphylaxis.  -     predniSONE (DELTASONE) 20 MG tablet; Take 2 tablets (40 mg total) by mouth daily with breakfast for 5 days. -     triamcinolone cream (KENALOG) 0.1 %; Apply 1 Application topically 2 (two) times daily. -     Ambulatory referral to Allergy     Continue all other maintenance medications.  Follow up plan: Return if symptoms worsen or fail to improve.   Continue healthy lifestyle choices, including diet (rich in fruits, vegetables, and lean proteins, and low in salt and simple carbohydrates) and exercise (at least 30 minutes of moderate physical activity daily).  Educational handout given for contact dermatitis  The above assessment and management plan was discussed with the patient. The patient verbalized understanding of and has agreed to the management plan. Patient is aware to call the clinic if they develop any new symptoms or if symptoms persist or worsen. Patient is aware when to return to the clinic for a follow-up visit. Patient educated on when it is appropriate to go to the emergency department.   Monia Pouch, FNP-C [facility] [phone]

## 2022-07-14 ENCOUNTER — Encounter (INDEPENDENT_AMBULATORY_CARE_PROVIDER_SITE_OTHER): Payer: Self-pay | Admitting: Gastroenterology

## 2022-07-20 NOTE — Progress Notes (Unsigned)
New Patient Note  RE: Mckenzie Cooley MRN: [id number] DOB: 1991-04-03 Date of Office Visit: 07/21/2022  Consult requested by: Baruch Gouty, FNP Primary care provider: Gwenlyn Perking, FNP  Chief Complaint: No chief complaint on file.  History of Present Illness: I had the pleasure of seeing Mckenzie Cooley for initial evaluation at the [facility] on 07/20/2022. She is a 32 y.o. female, who is referred here by Gwenlyn Perking, FNP for the evaluation of rash.  Rash started about *** ago. Mainly occurs on her ***. Describes them as ***. Individual rashes lasts about ***. No ecchymosis upon resolution. Associated symptoms include: ***.  Frequency of episodes: ***. Suspected triggers are ***. Denies any *** fevers, chills, changes in medications, foods, personal care products or recent infections. She has tried the following therapies: *** with *** benefit. Systemic steroids ***. Currently on ***.  Previous work up includes: ***. Previous history of rash/hives: ***. Patient is up to date with the following cancer screening tests: ***.  07/09/2022 PCP visit: "Mckenzie Cooley is a 32 y.o. female presenting on 07/09/2022 for white spots in throat (Patient states if she eats anything with red dye she gets spots in her throat that burns. Has been going on for a few months. ) and Rash (Right arm rash that has been there for a few months. States it itches. OTC medication is not working )     Pt presents today with complaints of intermittent pruritic rash to right arm and irritation to her throat over the last several months. States this seems to be triggered by certain foods such as red dye. States her throat will get better and then the irritation will return. Burning to pruritic in nature. Denies trouble breathing, throat swelling, dysphagia, stridor, cough, wheezing, or shortness of breath. She has tried over the counter creams for her arm without relief.    Rash This is a  recurrent problem. The problem has been waxing and waning since onset. The affected locations include the right arm. The rash is characterized by itchiness, dryness, scaling and redness. Associated symptoms include a sore throat. Pertinent negatives include no anorexia, congestion, cough, diarrhea, eye pain, facial edema, fatigue, fever, joint pain, nail changes, rhinorrhea, shortness of breath or vomiting. Past treatments include anti-itch cream, topical steroids and moisturizer. The treatment provided no relief."  Assessment and Plan: Mckenzie Cooley is a 32 y.o. female with: No problem-specific Assessment & Plan notes found for this encounter.  No follow-ups on file.  No orders of the defined types were placed in this encounter.  Lab Orders  No laboratory test(s) ordered today    Other allergy screening: Asthma: {Blank single:19197::"yes","no"} Rhino conjunctivitis: {Blank single:19197::"yes","no"} Food allergy: {Blank single:19197::"yes","no"} Medication allergy: {Blank single:19197::"yes","no"} Hymenoptera allergy: {Blank single:19197::"yes","no"} Urticaria: {Blank single:19197::"yes","no"} Eczema:{Blank single:19197::"yes","no"} History of recurrent infections suggestive of immunodeficency: {Blank single:19197::"yes","no"}  Diagnostics: Spirometry:  Tracings reviewed. Her effort: {Blank single:19197::"Good reproducible efforts.","It was hard to get consistent efforts and there is a question as to whether this reflects a maximal maneuver.","Poor effort, data can not be interpreted."} FVC: ***L FEV1: ***L, ***% predicted FEV1/FVC ratio: ***% Interpretation: {Blank single:19197::"Spirometry consistent with mild obstructive disease","Spirometry consistent with moderate obstructive disease","Spirometry consistent with severe obstructive disease","Spirometry consistent with possible restrictive disease","Spirometry consistent with mixed obstructive and restrictive disease","Spirometry  uninterpretable due to technique","Spirometry consistent with normal pattern","No overt abnormalities noted given today's efforts"}.  Please see scanned spirometry results for details.  Skin Testing: {Blank single:19197::"Select foods","Environmental allergy panel","Environmental  allergy panel and select foods","Food allergy panel","None","Deferred due to recent antihistamines use"}. *** Results discussed with patient/family.   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   Attention deficit hyperactivity disorder (ADHD), predominantly inattentive type 02/20/2022   History of bipolar disorder 02/20/2022   GAD (generalized anxiety disorder) 08/26/2021   Current moderate 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 episode ([place]) 08/26/2021   Esophageal dysmotility    RUQ abdominal pain    Vaginal irritation 04/22/2021   Vaginal discharge 04/01/2021   Routine general medical examination at a health care facility 02/06/2021   Patient desires pregnancy 02/06/2021   Bilateral carpal tunnel syndrome 11/06/2020   Spotting 12/14/2018   Carpal tunnel syndrome, left upper limb 06/01/2018   Migraine 12/09/2017   Iron deficiency 07/20/2017   Allergic otitis media of both ears 03/24/2017   History of severe pre-eclampsia 02/23/2017   History of anemia 02/23/2017   Previous cesarean section 02/09/2017   Postoperative anemia due to acute blood loss 01/06/2017   Constipation 10/27/2014   Gastroesophageal reflux disease 07/01/2011   Past Medical History:  Diagnosis Date   Acid reflux    Anemia    Bloating    Childhood asthma    Esophageal dysmotility    History of blood transfusion    Positive urine pregnancy test 12/14/2018   Preeclampsia    Seasonal allergies    Past Surgical History: Past Surgical History:  Procedure  Laterality Date   BIOPSY  02/26/2021   Procedure: BIOPSY;  Surgeon: Rogene Houston, MD;  [facility];  Service: Endoscopy;;  Esophageal biopsy   carpal tunnel Left 01/30/2021   CARPAL TUNNEL RELEASE Right 03/27/2021   CESAREAN SECTION N/A 01/02/2017   Procedure: CESAREAN SECTION;  Surgeon: Chancy Milroy, MD;  Location: [place];  Service: Obstetrics;  Laterality: N/A;   CHOLECYSTECTOMY  2011   Dr.Byerly   COLONOSCOPY WITH PROPOFOL N/A 05/21/2022   Procedure: COLONOSCOPY WITH PROPOFOL;  Surgeon: Harvel Quale, MD;  [facility]: [facility];  Service: Gastroenterology;  Laterality: N/A;  2:00pm, asa 2   ESOPHAGEAL DILATION N/A 02/26/2021   Procedure: ESOPHAGEAL DILATION;  Surgeon: Rogene Houston, MD;  [facility];  Service: Endoscopy;  Laterality: N/A;   ESOPHAGOGASTRODUODENOSCOPY N/A 10/26/2014   Procedure: ESOPHAGOGASTRODUODENOSCOPY (EGD);  Surgeon: Rogene Houston, MD;  [facility];  Service: Endoscopy;  Laterality: N/A;   ESOPHAGOGASTRODUODENOSCOPY (EGD) WITH PROPOFOL N/A 02/26/2021   Procedure: ESOPHAGOGASTRODUODENOSCOPY (EGD) WITH PROPOFOL;  Surgeon: Rogene Houston, MD;  [facility];  Service: Endoscopy;  Laterality: N/A;   POLYPECTOMY  02/26/2021   Procedure: POLYPECTOMY;  Surgeon: Rogene Houston, MD;  [facility];  Service: Endoscopy;;  Gastric Body Polyps x 3    UPPER GASTROINTESTINAL ENDOSCOPY  12/05/2009   UPPER GASTROINTESTINAL ENDOSCOPY  02/07/2009   WISDOM TOOTH EXTRACTION     WISDOM TOOTH EXTRACTION     Medication List:  Current Outpatient Medications  Medication Sig Dispense Refill   acetaminophen (TYLENOL) 500 MG tablet Take 1,000 mg by mouth every 8 (eight) hours as needed for moderate pain. TAKES IN LIQUID FORM     dicyclomine (BENTYL) 10 MG capsule Take 1 capsule (10 mg total) by mouth 4 (four) times daily -  before meals and at bedtime. (Patient taking differently: Take 10 mg  by  mouth daily.) 120 capsule 5   esomeprazole (NEXIUM) 20 MG capsule TAKE ONE CAPSULE ONCE DAILY BEFORE BREAKFAST 30 capsule 12   famotidine (PEPCID) 40 MG tablet Take 40 mg by mouth daily.     linaclotide (LINZESS) 72 MCG capsule Take 1 capsule (72 mcg total) by mouth daily before breakfast. 90 capsule 1   ondansetron (ZOFRAN) 8 MG tablet TAKE ONE TABLET EVERY 8 HOURS AS NEEDED FOR NAUSEA AND VOMITING 30 tablet 0   triamcinolone cream (KENALOG) 0.1 % Apply 1 Application topically 2 (two) times daily. 30 g 0   No current facility-administered medications for this visit.   Allergies: Allergies  Allergen Reactions   Percocet [Oxycodone-Acetaminophen] Hives and Itching   Prilosec [Omeprazole] Other (See Comments)    Stomach cramp   Amoxicillin Rash    Has patient had a PCN reaction causing immediate rash, facial/tongue/throat swelling, SOB or lightheadedness with hypotension: Yes -mild rash Has patient had a PCN reaction causing severe rash involving mucus membranes or skin necrosis: No Has patient had a PCN reaction that required hospitalization: No Has patient had a PCN reaction occurring within the last 10 years: Yes If all of the above answers are "NO", then may proceed with Cephalosporin use. Has tolerated ceftriaxone & cephalexin    Social History: Social History   Socioeconomic History   Marital status: Married    Spouse name: Not on file   Number of children: 1   Years of education: Not on file   Highest education level: Not on file  Occupational History   Not on file  Tobacco Use   Smoking status: Former    Packs/day: 0.50    Years: 12.00    Total pack years: 6.00    Types: Cigarettes    Quit date: 05/05/2016    Years since quitting: 6.2   Smokeless tobacco: Never  Vaping Use   Vaping Use: Never used  Substance and Sexual Activity   Alcohol use: No    Alcohol/week: 0.0 standard drinks of alcohol   Drug use: No   Sexual activity: Yes    Birth control/protection:  Condom  Other Topics Concern   Not on file  Social History Narrative   Not on file   Social Determinants of Health   Financial Resource Strain: Low Risk  (02/06/2021)   Overall Financial Resource Strain (CARDIA)    Difficulty of Paying Living Expenses: Not hard at all  Food Insecurity: No Food Insecurity (02/06/2021)   Hunger Vital Sign    Worried About Running Out of Food in the Last Year: Never true    Ran Out of Food in the Last Year: Never true  Transportation Needs: No Transportation Needs (02/06/2021)   PRAPARE - Hydrologist (Medical): No    Lack of Transportation (Non-Medical): No  Physical Activity: Inactive (02/06/2021)   Exercise Vital Sign    Days of Exercise per Week: 0 days    Minutes of Exercise per Session: 0 min  Stress: No Stress Concern Present (02/06/2021)   [place]    Feeling of Stress : Not at all  Social Connections: Moderately Integrated (02/06/2021)   Social Connection and Isolation Panel [NHANES]    Frequency of Communication with Friends and Family: More than three times a week    Frequency of Social Gatherings with Friends and Family: Once a week    Attends Religious Services: 1 to 4 times per year  Active Member of Clubs or Organizations: No    Attends Archivist Meetings: Never    Marital Status: Married   Lives in a ***. Smoking: *** Occupation: ***  Environmental HistoryFreight forwarder in the house: Estate agent in the family room: {Blank single:19197::"yes","no"} Carpet in the bedroom: {Blank single:19197::"yes","no"} Heating: {Blank single:19197::"electric","gas","heat pump"} Cooling: {Blank single:19197::"central","window","heat pump"} Pet: {Blank single:19197::"yes ***","no"}  Family History: Family History  Problem Relation Age of Onset   Kidney disease Maternal Grandmother    Renal Disease  Maternal Grandmother    Heart disease Father    Hypertension Mother    Cancer - Cervical Maternal Aunt    Problem                               Relation Asthma                                   *** Eczema                                *** Food allergy                          *** Allergic rhino conjunctivitis     ***  Review of Systems  Constitutional:  Negative for appetite change, chills, fever and unexpected weight change.  HENT:  Negative for congestion and rhinorrhea.   Eyes:  Negative for itching.  Respiratory:  Negative for cough, chest tightness, shortness of breath and wheezing.   Cardiovascular:  Negative for chest pain.  Gastrointestinal:  Negative for abdominal pain.  Genitourinary:  Negative for difficulty urinating.  Skin:  Negative for rash.  Neurological:  Negative for headaches.    Objective: LMP 06/26/2021  There is no height or weight on file to calculate BMI. Physical Exam Vitals and nursing note reviewed.  Constitutional:      Appearance: Normal appearance. She is well-developed.  HENT:     Head: Normocephalic and atraumatic.     Right Ear: Tympanic membrane and external ear normal.     Left Ear: Tympanic membrane and external ear normal.     Nose: Nose normal.     Mouth/Throat:     Mouth: Mucous membranes are moist.     Pharynx: Oropharynx is clear.  Eyes:     Conjunctiva/sclera: Conjunctivae normal.  Cardiovascular:     Rate and Rhythm: Normal rate and regular rhythm.     Heart sounds: Normal heart sounds. No murmur heard.    No friction rub. No gallop.  Pulmonary:     Effort: Pulmonary effort is normal.     Breath sounds: Normal breath sounds. No wheezing, rhonchi or rales.  Musculoskeletal:     Cervical back: Neck supple.  Skin:    General: Skin is warm.     Findings: No rash.  Neurological:     Mental Status: She is alert and oriented to person, place, and time.  Psychiatric:        Behavior: Behavior normal.    The plan was  reviewed with the patient/family, and all questions/concerned were addressed.  It was my pleasure to see Driana today and participate in her care. Please feel free to contact me with any questions or concerns.  Sincerely,  Scherrie Bateman  Maudie Mercury, DO Allergy & Immunology  [facility] office: [place] office: [phone]

## 2022-07-21 ENCOUNTER — Encounter: Payer: Self-pay | Admitting: Allergy

## 2022-07-21 ENCOUNTER — Ambulatory Visit: Payer: Medicaid Other | Admitting: Allergy

## 2022-07-21 ENCOUNTER — Other Ambulatory Visit: Payer: Self-pay

## 2022-07-21 VITALS — BP 122/64 | HR 52 | Temp 98.2°F | Resp 16 | Ht 60.25 in | Wt 222.2 lb

## 2022-07-21 DIAGNOSIS — R198 Other specified symptoms and signs involving the digestive system and abdomen: Secondary | ICD-10-CM | POA: Diagnosis not present

## 2022-07-21 DIAGNOSIS — K219 Gastro-esophageal reflux disease without esophagitis: Secondary | ICD-10-CM | POA: Diagnosis not present

## 2022-07-21 DIAGNOSIS — T7819XD Other adverse food reactions, not elsewhere classified, subsequent encounter: Secondary | ICD-10-CM | POA: Insufficient documentation

## 2022-07-21 DIAGNOSIS — T781XXD Other adverse food reactions, not elsewhere classified, subsequent encounter: Secondary | ICD-10-CM | POA: Diagnosis not present

## 2022-07-21 DIAGNOSIS — L299 Pruritus, unspecified: Secondary | ICD-10-CM | POA: Diagnosis not present

## 2022-07-21 DIAGNOSIS — L2089 Other atopic dermatitis: Secondary | ICD-10-CM | POA: Insufficient documentation

## 2022-07-21 DIAGNOSIS — R21 Rash and other nonspecific skin eruption: Secondary | ICD-10-CM | POA: Diagnosis not present

## 2022-07-21 MED ORDER — TRIAMCINOLONE ACETONIDE 0.1 % EX OINT: 1.0000 | TOPICAL_OINTMENT | Freq: Two times a day (BID) | CUTANEOUS | 2 refills | Status: DC | PRN

## 2022-07-21 NOTE — Assessment & Plan Note (Signed)
Pruritic rash on arm x 3 months. Triggers unknown but "red" colored foods sometimes flares it. Tried otc eczema creams and triamcinolone with some benefit. Concerned about allergic triggers. Today's skin prick testing showed: Negative to indoor/outdoor allergens and food panel.  Some of the current spots look eczematous.  See below for proper skin care. Take pictures when the skin flares.  Use triamcinolone 0.1% ointment twice a day as needed for rash flares. Do not use on the face, neck, armpits or groin area. Do not use more than 3 weeks in a row.  May use over the counter antihistamines such as Zyrtec (cetirizine), Claritin (loratadine), Allegra (fexofenadine), or Xyzal (levocetirizine) daily as needed for itching.

## 2022-07-21 NOTE — Patient Instructions (Addendum)
Today's skin testing showed: Negative to indoor/outdoor allergens and food panel.   Results given.  Skin See below for proper skin care. Take pictures when the skin flares.  Use triamcinolone 0.1% ointment twice a day as needed for rash flares. Do not use on the face, neck, armpits or groin area. Do not use more than 3 weeks in a row.  May use over the counter antihistamines such as Zyrtec (cetirizine), Claritin (loratadine), Allegra (fexofenadine), or Xyzal (levocetirizine) daily as needed for itching.   Food Avoid foods that are bothersome - red colored food? See handout for lifestyle and dietary modifications for GERD. Keep taking famotidine and and Nexium as prescribed. Follow up with GI.  Follow up if needed.   Skin care recommendations  Bath time: Always use lukewarm water. AVOID very hot or cold water. Keep bathing time to 5-10 minutes. Do NOT use bubble bath. Use a mild soap and use just enough to wash the dirty areas. Do NOT scrub skin vigorously.  After bathing, pat dry your skin with a towel. Do NOT rub or scrub the skin.  Moisturizers and prescriptions:  ALWAYS apply moisturizers immediately after bathing (within 3 minutes). This helps to lock-in moisture. Use the moisturizer several times a day over the whole body. Good summer moisturizers include: Aveeno, CeraVe, Cetaphil. Good winter moisturizers include: Aquaphor, Vaseline, Cerave, Cetaphil, Eucerin, Vanicream. When using moisturizers along with medications, the moisturizer should be applied about one hour after applying the medication to prevent diluting effect of the medication or moisturize around where you applied the medications. When not using medications, the moisturizer can be continued twice daily as maintenance.  Laundry and clothing: Avoid laundry products with added color or perfumes. Use unscented hypo-allergenic laundry products such as Tide free, Cheer free & gentle, and All free and clear.  If  the skin still seems dry or sensitive, you can try double-rinsing the clothes. Avoid tight or scratchy clothing such as wool. Do not use fabric softeners or dyer sheets.

## 2022-07-21 NOTE — Assessment & Plan Note (Addendum)
Noted sore throat, abdominal pain after eating red foods and hamburger. This started 1 month ago. Symptoms resolve within 1 day. History of esophageal dysphagia and follows with GI. Negative EoE in the past. Takes Nexium and Pepcid for reflux. Today's skin testing showed: negative to food panel.  Discussed with patient that her symptoms are not typical of an IgE mediated food reaction.  Avoid foods that are bothersome - red colored food. I don't have skin testing for red dye.  See handout for lifestyle and dietary modifications for GERD. Keep taking famotidine and and Nexium as prescribed. Follow up with GI as scheduled.

## 2022-08-04 ENCOUNTER — Ambulatory Visit (INDEPENDENT_AMBULATORY_CARE_PROVIDER_SITE_OTHER): Payer: Medicaid Other | Admitting: Gastroenterology

## 2022-08-04 ENCOUNTER — Encounter (INDEPENDENT_AMBULATORY_CARE_PROVIDER_SITE_OTHER): Payer: Self-pay | Admitting: Gastroenterology

## 2022-08-04 ENCOUNTER — Encounter (INDEPENDENT_AMBULATORY_CARE_PROVIDER_SITE_OTHER): Payer: Self-pay

## 2022-08-04 VITALS — BP 124/83 | HR 71 | Temp 97.9°F | Ht 60.25 in | Wt 221.1 lb

## 2022-08-04 DIAGNOSIS — K581 Irritable bowel syndrome with constipation: Secondary | ICD-10-CM

## 2022-08-04 DIAGNOSIS — K224 Dyskinesia of esophagus: Secondary | ICD-10-CM

## 2022-08-04 DIAGNOSIS — R1319 Other dysphagia: Secondary | ICD-10-CM | POA: Diagnosis not present

## 2022-08-04 NOTE — H&P (View-Only) (Signed)
Referring Provider: Gwenlyn Perking, FNP Primary Care Physician:  Mckenzie Perking, FNP Primary GI Physician: Mckenzie Cooley   Chief Complaint  Patient presents with   Gastroesophageal Reflux    Follow up. Had to stop taking nexium and pepcid due to med getting stuck. She started taking famotidine because it is smaller. Med is not controlling reflux. Food started getting stuck again about one month ago.    HPI:   Mckenzie Cooley is a 32 y.o. female with past medical history of esophageal dysmotility, anemia, asthma, IBS   Patient presenting today for follow up of GERD/dysphagia.   History: referred for evaluation by Dr. Truddie Cooley at [facility]  Last time seen by his office was September 2023, Patient was advised to take medications with yogurt or applesauce to allow adequate swallowing of tablets and to sip on liquids with meals. Also start bethanechol TID, Pepcid and PPI.   She had a previous esophagram showing slow emptying of the 30 mm tablet from the distal esophagus into the stomach but no standing: After 1 minute.  She also underwent endoFLIP, which showed pressure of 30 mmHg at 30 ml, compliance was 125, CSA was 34,and contractions were seen but difficult to describe secondary to the 8cm balloon . At 47m the balloon pressure was 371mg, DI 02, minimum diameter was 75m64mcompliance was 45, CSA was 45.  This was consistent with reduced distensibility with absent contractile response.  She underwent an empiric dilation with savory dilator up to 18 mm.   Most recent manometry - 05/02/21 *Notes. Motility values are mean among swallows;: Simultaneous contractions: Velocity > 8.0 cm/s; eSlv: eSleeve; 3SN, IRP, DCI, IBP - See manual definitions Supine liquid swallow interpretation : The patient has >50% with a DCI<450 but less than 70%. There is a normal tensive normal relaxing LES. Inconclusive diagnosis of IEM Multiple rapid swallows : Normal deglutitive inhibition and adequate contractile  reserve Upright liquid swallow analysis : 4 swallows with a DCI<450 and IRP<1m29mRapid drink challenge : Normal deglutitive inhibition and IRP <1mm40mabnormal impedance bolus clearance. Impedance analysis : abnormal bolus clearance for liquids. Normal esophageal impedance integral ratio Overall impression :.The diagnosis of ineffective esophageal motility is possible based on the manometry but not conclusive. The lack of contractile reserve on multiple rapid swallows and DCI< than 450 in the majority of the upright swallows is concerning and supports a diagnosis of IEM. Would correlate with bolus clearance on barium esophagram.    Last seen November 2023, at that time recurrent episodes of constipation for the last 2 months. She states that every time she moves her bowels, she has to push stool out with a glove (she had to do this in 5 occasions). has to strain significantly to move her bowels, but sometimes stool comes out spontaneously. noticed occasionally some fresh blood in her stool. She has a BM every 4 days, but she used to have a BM every day in the past. States that she has abdominal bloating and epigastric abdominal pain, which improves significantly after having a bowel movement.  taking stool softeners daily without relief. She reports feeling nauseated frequently, worse at night.    Recommended to have TCS, start linzess 145mcg22mntinue to follow with Dr. ClaytoTruddie Crumbleding dysphagia.   Present:  For the past 1.5 months she has had more dysphagia, sore throat. Saw an allergist about 2 weeks ago and was told to avoid meat and red dyes as this tends to cause her to have swelling and  issues with swallowing. She was sent for a red dye test but was unable to do this. She notes that she stopped her nexium as she could not swallow the pill. She is only taking famotidine '40mg'$  daily. She is having dysphagia more with larger, thicker foods. She is not taking her pills with yogurt/apple sauce. She  is having more breakthrough acid reflux symptoms since she stopped her nexium. She previously was tried bethanacol per recommendation from Dr Mckenzie Cooley office but she could not swallow it due to the size. She does not have follow up with Dr. Truddie Cooley until next month.   Constipation is well managed on linzess 36mg daily. Having a BM maybe every other day. She denies rectal bleeding. Has some chronic abdominal soreness. Has chronic nausea   Last Colonoscopy:05/21/22- Perianal skin tags found on perianal exam. - The examined portion of the ileum was normal. - The entire examined colon is normal.  - Non-bleeding internal hemorrhoids. - No specimens collected. Last Endoscopy/endoflip :March 2023 2cm hh he Endoscopic was placed in  the stomach using endoscopic guidance. The balloon was inflated to 334m and pulled proximally until the waist was  located along the distal sensors.  At 3056mhe balloon pressure was 65m39m DI 2 minimum diameter was 8mm,47mompliance was  125, CSA was 34,and contractions were seen but difficult  to describe secondary to the 8cm balloon  At 40ml 42mballoon pressure was 65mmHg49m 02, minimum diameter was 8mm,  c29mliance was 45, CSA was 45  and same as above  This is consistent with reduced distensibility with absent contractile  response   Distal and proximal esophageal biopsies were obtained   guidewire was placed in the gastric antrum.Over the guidewire the 18mm  Sa63m dilators was passed with mild resistance. Post dilation inspection  was satisfactory    Past Medical History:  Diagnosis Date   Acid reflux    Anemia    Bloating    Childhood asthma    Eczema    Esophageal dysmotility    History of blood transfusion    Positive urine pregnancy test 12/14/2018   Preeclampsia    Seasonal allergies     Past Surgical History:  Procedure Laterality Date   BIOPSY  02/26/2021   Procedure: BIOPSY;  Surgeon: Rehman, NRogene Houstoncation: [facility] SUITE;   Service: Endoscopy;;  Esophageal biopsy   carpal tunnel Left 01/30/2021   CARPAL TUNNEL RELEASE Right 03/27/2021   CESAREAN SECTION N/A 01/02/2017   Procedure: CESAREAN SECTION;  Surgeon: Ervin, MiChancy Milroycation: WH BIRTHIEffiee: Obstetrics;  Laterality: N/A;   CHOLECYSTECTOMY  2011   Dr.Byerly   COLONOSCOPY WITH PROPOFOL N/A 05/21/2022   Procedure: COLONOSCOPY WITH PROPOFOL;  Surgeon: CastanedaHarvel Qualecation: [facility];  Service: Gastroenterology;  Laterality: N/A;  2:00pm, asa 2   ESOPHAGEAL DILATION N/A 02/26/2021   Procedure: ESOPHAGEAL DILATION;  Surgeon: Rehman, NRogene Houstoncation: [facility];  Service: Endoscopy;  Laterality: N/A;   ESOPHAGOGASTRODUODENOSCOPY N/A 10/26/2014   Procedure: ESOPHAGOGASTRODUODENOSCOPY (EGD);  Surgeon: Najeeb U Rogene Houstoncation: [facility];  Service: Endoscopy;  Laterality: N/A;   ESOPHAGOGASTRODUODENOSCOPY (EGD) WITH PROPOFOL N/A 02/26/2021   Procedure: ESOPHAGOGASTRODUODENOSCOPY (EGD) WITH PROPOFOL;  Surgeon: Rehman, NRogene Houstoncation: [facility];  Service: Endoscopy;  Laterality: N/A;   POLYPECTOMY  02/26/2021   Procedure: POLYPECTOMY;  Surgeon: Rehman, NRogene Houstoncation: [facility];  Service: Endoscopy;;  Gastric Body Polyps x 3    TYMPANOSTOMY TUBE PLACEMENT     UPPER GASTROINTESTINAL ENDOSCOPY  12/05/2009   UPPER GASTROINTESTINAL ENDOSCOPY  02/07/2009   WISDOM TOOTH EXTRACTION     WISDOM TOOTH EXTRACTION      Current Outpatient Medications  Medication Sig Dispense Refill   acetaminophen (TYLENOL) 500 MG tablet Take 1,000 mg by mouth every 8 (eight) hours as needed for moderate pain. TAKES IN LIQUID FORM     dicyclomine (BENTYL) 10 MG capsule Take 1 capsule (10 mg total) by mouth 4 (four) times daily -  before meals and at bedtime. (Patient taking differently: Take 10 mg by mouth daily.) 120 capsule 5   famotidine (PEPCID) 40 MG tablet Take 40 mg by mouth daily.      linaclotide (LINZESS) 72 MCG capsule Take 1 capsule (72 mcg total) by mouth daily before breakfast. 90 capsule 1   triamcinolone ointment (KENALOG) 0.1 % Apply 1 Application topically 2 (two) times daily as needed (rash flare). Do not use on the face, neck, armpits or groin area. Do not use more than 3 weeks in a row. 30 g 2   esomeprazole (NEXIUM) 20 MG capsule TAKE ONE CAPSULE ONCE DAILY BEFORE BREAKFAST (Patient not taking: Reported on 08/04/2022) 30 capsule 12   No current facility-administered medications for this visit.    Allergies as of 08/04/2022 - Review Complete 08/04/2022  Allergen Reaction Noted   Percocet [oxycodone-acetaminophen] Hives and Itching 07/01/2011   Prilosec [omeprazole] Other (See Comments) 12/30/2020   Amoxicillin Rash 01/17/2014    Family History  Problem Relation Age of Onset   Hypertension Mother    Heart disease Father    Cancer - Cervical Maternal Aunt    Kidney disease Maternal Grandmother    Renal Disease Maternal Grandmother    Eczema Daughter     Social History   Socioeconomic History   Marital status: Married    Spouse name: Not on file   Number of children: 1   Years of education: Not on file   Highest education level: Not on file  Occupational History   Not on file  Tobacco Use   Smoking status: Former    Packs/day: 0.50    Years: 12.00    Total pack years: 6.00    Types: Cigarettes    Quit date: 05/05/2016    Years since quitting: 6.2    Passive exposure: Past   Smokeless tobacco: Never  Vaping Use   Vaping Use: Never used  Substance and Sexual Activity   Alcohol use: No    Alcohol/week: 0.0 standard drinks of alcohol   Drug use: No   Sexual activity: Yes    Birth control/protection: Condom  Other Topics Concern   Not on file  Social History Narrative   Not on file   Social Determinants of Health   Financial Resource Strain: Low Risk  (02/06/2021)   Overall Financial Resource Strain (CARDIA)    Difficulty of Paying  Living Expenses: Not hard at all  Food Insecurity: No Food Insecurity (02/06/2021)   Hunger Vital Sign    Worried About Running Out of Food in the Last Year: Never true    Ran Out of Food in the Last Year: Never true  Transportation Needs: No Transportation Needs (02/06/2021)   PRAPARE - Hydrologist (Medical): No    Lack of Transportation (Non-Medical): No  Physical Activity: Inactive (02/06/2021)   Exercise Vital Sign  Days of Exercise per Week: 0 days    Minutes of Exercise per Session: 0 min  Stress: No Stress Concern Present (02/06/2021)   [place]    Feeling of Stress : Not at all  Social Connections: Moderately Integrated (02/06/2021)   Social Connection and Isolation Panel [NHANES]    Frequency of Communication with Friends and Family: More than three times a week    Frequency of Social Gatherings with Friends and Family: Once a week    Attends Religious Services: 1 to 4 times per year    Active Member of [employer] or Organizations: No    Attends Music therapist: Never    Marital Status: Married   Review of systems General: negative for malaise, night sweats, fever, chills, weight loss Neck: Negative for lumps, goiter, pain and significant neck swelling Resp: Negative for cough, wheezing, dyspnea at rest CV: Negative for chest pain, leg swelling, palpitations, orthopnea GI: denies melena, hematochezia, vomiting, diarrhea, constipation, odyonophagia, early satiety or unintentional weight loss. +nausea +dysphagia +abdominal soreness MSK: Negative for joint pain or swelling, back pain, and muscle pain. Derm: Negative for itching or rash Psych: Denies depression, anxiety, memory loss, confusion. No homicidal or suicidal ideation.  Heme: Negative for prolonged bleeding, bruising easily, and swollen nodes. Endocrine: Negative for cold or heat intolerance, polyuria, polydipsia  and goiter. Neuro: negative for tremor, gait imbalance, syncope and seizures. The remainder of the review of systems is noncontributory.  Physical Exam: LMP 06/26/2021  General:   Alert and oriented. No distress noted. Pleasant and cooperative.  Head:  Normocephalic and atraumatic. Eyes:  Conjuctiva clear without scleral icterus. Mouth:  Oral mucosa pink and moist. Good dentition. No lesions. Heart: Normal rate and rhythm, s1 and s2 heart sounds present.  Lungs: Clear lung sounds in all lobes. Respirations equal and unlabored. Abdomen:  +BS, soft, non-tender and non-distended. No rebound or guarding. No HSM or masses noted. Derm: No palmar erythema or jaundice Msk:  Symmetrical without gross deformities. Normal posture. Extremities:  Without edema. Neurologic:  Alert and  oriented x4 Psych:  Alert and cooperative. Normal mood and affect.  Invalid input(s): "6 MONTHS"   ASSESSMENT: Mckenzie Cooley is a 32 y.o. female presenting today for follow up.  GERD:previously on nexium and famotidine with good control of her symptoms, though when she began having dysphagia again about 1.5 months ago, had to stop the nexium as she is unable to swallow the larger pill. Still taking famotidine '40mg'$  daily. Discussed importance of utilizing yogurt/applesauce with pills. She can open nexium capsule and mix contents into transport medium if she still unable to tolerate larger capsule. Should continue with H2B in the evenings and reflux precautions.   Dysphagia/esophageal dysmotility: extensive workup as outlined above. Last EGD with endoflip in march 2023 by Dr. Truddie Cooley at [facility]. Has upcoming appt with him next month. Recent visit with allergist suggests possible reaction to red dye though no confirmatory testing done. She is inquiring about having repeat EGD here due to recurrence of her dysphagia. Discussed case with Dr. Jenetta Cooley who is in agreement to proceed with EGD +/- dilation, patient should still  continue to follow with Dr. Truddie Cooley at [facility] for further management of her esophageal dysmotility. Indications, risks and benefits of procedure discussed in detail with patient. Patient verbalized understanding and is in agreement to proceed with EGD.   Constipation: well managed with linzess 28mg daily. Will continue with current regimen  PLAN:  Open Nexium capsules into yogurt/applesauce  2. Continue famotidine '40mg'$  QHS  3. Chewing precautions  4. Continue linzess 29mg daily  5. Schedule EGD +/- dilation 6. [facility]  7. Chewing precautions   All questions were answered, patient verbalized understanding and is in agreement with plan as outlined above.    Follow Up: 3 months  Kathrene Sinopoli L. CAlver Sorrow MSN, APRN, AGNP-C Adult-Gerontology Nurse Practitioner [facility]  I have reviewed the note and agree with the APP's assessment as described in this progress note  DMaylon Peppers MD Gastroenterology and Hepatology [facility] Center[facility]astroenterology

## 2022-08-04 NOTE — Progress Notes (Addendum)
Referring Provider: Gwenlyn Perking, FNP Primary Care Physician:  Gwenlyn Perking, FNP Primary GI Physician: Jenetta Downer   Chief Complaint  Patient presents with   Gastroesophageal Reflux    Follow up. Had to stop taking nexium and pepcid due to med getting stuck. She started taking famotidine because it is smaller. Med is not controlling reflux. Food started getting stuck again about one month ago.    HPI:   Mckenzie Cooley is a 32 y.o. female with past medical history of esophageal dysmotility, anemia, asthma, IBS   Patient presenting today for follow up of GERD/dysphagia.   History: referred for evaluation by Dr. Truddie Crumble at [facility].  Last time seen by his office was September 2023, Patient was advised to take medications with yogurt or applesauce to allow adequate swallowing of tablets and to sip on liquids with meals. Also start bethanechol TID, Pepcid and PPI.   She had a previous esophagram showing slow emptying of the 30 mm tablet from the distal esophagus into the stomach but no standing: After 1 minute.  She also underwent endoFLIP, which showed pressure of 30 mmHg at 30 ml, compliance was 125, CSA was 34,and contractions were seen but difficult to describe secondary to the 8cm balloon . At 89m the balloon pressure was 357mg, DI 02, minimum diameter was 31m9mcompliance was 45, CSA was 45.  This was consistent with reduced distensibility with absent contractile response.  She underwent an empiric dilation with savory dilator up to 18 mm.   Most recent manometry - 05/02/21 *Notes. Motility values are mean among swallows;: Simultaneous contractions: Velocity > 8.0 cm/s; eSlv: eSleeve; 3SN, IRP, DCI, IBP - See manual definitions Supine liquid swallow interpretation : The patient has >50% with a DCI<450 but less than 70%. There is a normal tensive normal relaxing LES. Inconclusive diagnosis of IEM Multiple rapid swallows : Normal deglutitive inhibition and adequate contractile  reserve Upright liquid swallow analysis : 4 swallows with a DCI<450 and IRP<55m39mRapid drink challenge : Normal deglutitive inhibition and IRP <55mm72mabnormal impedance bolus clearance. Impedance analysis : abnormal bolus clearance for liquids. Normal esophageal impedance integral ratio Overall impression :.The diagnosis of ineffective esophageal motility is possible based on the manometry but not conclusive. The lack of contractile reserve on multiple rapid swallows and DCI< than 450 in the majority of the upright swallows is concerning and supports a diagnosis of IEM. Would correlate with bolus clearance on barium esophagram.    Last seen November 2023, at that time recurrent episodes of constipation for the last 2 months. She states that every time she moves her bowels, she has to push stool out with a glove (she had to do this in 5 occasions). has to strain significantly to move her bowels, but sometimes stool comes out spontaneously. noticed occasionally some fresh blood in her stool. She has a BM every 4 days, but she used to have a BM every day in the past. States that she has abdominal bloating and epigastric abdominal pain, which improves significantly after having a bowel movement.  taking stool softeners daily without relief. She reports feeling nauseated frequently, worse at night.    Recommended to have TCS, start linzess 145mcg50mntinue to follow with Dr. ClaytoTruddie Crumbleding dysphagia.   Present:  For the past 1.5 months she has had more dysphagia, sore throat. Saw an allergist about 2 weeks ago and was told to avoid meat and red dyes as this tends to cause her to have swelling and  issues with swallowing. She was sent for a red dye test but was unable to do this. She notes that she stopped her nexium as she could not swallow the pill. She is only taking famotidine 23m daily. She is having dysphagia more with larger, thicker foods. She is not taking her pills with yogurt/apple sauce. She  is having more breakthrough acid reflux symptoms since she stopped her nexium. She previously was tried bethanacol per recommendation from Dr CRosanne Sackoffice but she could not swallow it due to the size. She does not have follow up with Dr. CTruddie Crumbleuntil next month.   Constipation is well managed on linzess 768m daily. Having a BM maybe every other day. She denies rectal bleeding. Has some chronic abdominal soreness. Has chronic nausea   Last Colonoscopy:05/21/22- Perianal skin tags found on perianal exam. - The examined portion of the ileum was normal. - The entire examined colon is normal.  - Non-bleeding internal hemorrhoids. - No specimens collected. Last Endoscopy/endoflip :March 2023 2cm hh he Endoscopic was placed in  the stomach using endoscopic guidance. The balloon was inflated to 3063mand pulled proximally until the waist was  located along the distal sensors.  At 13m63me balloon pressure was 13mm20mDI 2 minimum diameter was 8mm, 81mmpliance was  125, CSA was 34,and contractions were seen but difficult  to describe secondary to the 8cm balloon  At 40ml t21malloon pressure was 13mmHg,62m02, minimum diameter was 8mm,  co49miance was 45, CSA was 45  and same as above  This is consistent with reduced distensibility with absent contractile  response   Distal and proximal esophageal biopsies were obtained   guidewire was placed in the gastric antrum.Over the guidewire the 18mm  Sav61mdilators was passed with mild resistance. Post dilation inspection  was satisfactory    Past Medical History:  Diagnosis Date   Acid reflux    Anemia    Bloating    Childhood asthma    Eczema    Esophageal dysmotility    History of blood transfusion    Positive urine pregnancy test 12/14/2018   Preeclampsia    Seasonal allergies     Past Surgical History:  Procedure Laterality Date   BIOPSY  02/26/2021   Procedure: BIOPSY;  Surgeon: Rehman, NaRogene Houstonation: AP ENDO SUITE;   Service: Endoscopy;;  Esophageal biopsy   carpal tunnel Left 01/30/2021   CARPAL TUNNEL RELEASE Right 03/27/2021   CESAREAN SECTION N/A 01/02/2017   Procedure: CESAREAN SECTION;  Surgeon: Ervin, MicChancy Milroyation: WH BIRTHINAscutney: Obstetrics;  Laterality: N/A;   CHOLECYSTECTOMY  2011   Dr.Byerly   COLONOSCOPY WITH PROPOFOL N/A 05/21/2022   Procedure: COLONOSCOPY WITH PROPOFOL;  Surgeon: Castaneda Harvel Qualeation: [facility] ENDO SUITE;  Service: Gastroenterology;  Laterality: N/A;  2:00pm, asa 2   ESOPHAGEAL DILATION N/A 02/26/2021   Procedure: ESOPHAGEAL DILATION;  Surgeon: Rehman, NaRogene Houstonation: AP ENDO SUITE;  Service: Endoscopy;  Laterality: N/A;   ESOPHAGOGASTRODUODENOSCOPY N/A 10/26/2014   Procedure: ESOPHAGOGASTRODUODENOSCOPY (EGD);  Surgeon: Najeeb U RRogene Houstonation: [facility] ENDO SUITE;  Service: Endoscopy;  Laterality: N/A;   ESOPHAGOGASTRODUODENOSCOPY (EGD) WITH PROPOFOL N/A 02/26/2021   Procedure: ESOPHAGOGASTRODUODENOSCOPY (EGD) WITH PROPOFOL;  Surgeon: Rehman, NaRogene Houstonation: [facility] ENDO SUITE;  Service: Endoscopy;  Laterality: N/A;   POLYPECTOMY  02/26/2021   Procedure: POLYPECTOMY;  Surgeon: Rehman, NaRogene Houstonation: [facility] ENDO SUITE;  Service: Endoscopy;;  Gastric Body Polyps x 3    TYMPANOSTOMY TUBE PLACEMENT     UPPER GASTROINTESTINAL ENDOSCOPY  12/05/2009   UPPER GASTROINTESTINAL ENDOSCOPY  02/07/2009   WISDOM TOOTH EXTRACTION     WISDOM TOOTH EXTRACTION      Current Outpatient Medications  Medication Sig Dispense Refill   acetaminophen (TYLENOL) 500 MG tablet Take 1,000 mg by mouth every 8 (eight) hours as needed for moderate pain. TAKES IN LIQUID FORM     dicyclomine (BENTYL) 10 MG capsule Take 1 capsule (10 mg total) by mouth 4 (four) times daily -  before meals and at bedtime. (Patient taking differently: Take 10 mg by mouth daily.) 120 capsule 5   famotidine (PEPCID) 40 MG tablet Take 40 mg by mouth daily.      linaclotide (LINZESS) 72 MCG capsule Take 1 capsule (72 mcg total) by mouth daily before breakfast. 90 capsule 1   triamcinolone ointment (KENALOG) 0.1 % Apply 1 Application topically 2 (two) times daily as needed (rash flare). Do not use on the face, neck, armpits or groin area. Do not use more than 3 weeks in a row. 30 g 2   esomeprazole (NEXIUM) 20 MG capsule TAKE ONE CAPSULE ONCE DAILY BEFORE BREAKFAST (Patient not taking: Reported on 08/04/2022) 30 capsule 12   No current facility-administered medications for this visit.    Allergies as of 08/04/2022 - Review Complete 08/04/2022  Allergen Reaction Noted   Percocet [oxycodone-acetaminophen] Hives and Itching 07/01/2011   Prilosec [omeprazole] Other (See Comments) 12/30/2020   Amoxicillin Rash 01/17/2014    Family History  Problem Relation Age of Onset   Hypertension Mother    Heart disease Father    Cancer - Cervical Maternal Aunt    Kidney disease Maternal Grandmother    Renal Disease Maternal Grandmother    Eczema Daughter     Social History   Socioeconomic History   Marital status: Married    Spouse name: Not on file   Number of children: 1   Years of education: Not on file   Highest education level: Not on file  Occupational History   Not on file  Tobacco Use   Smoking status: Former    Packs/day: 0.50    Years: 12.00    Total pack years: 6.00    Types: Cigarettes    Quit date: 05/05/2016    Years since quitting: 6.2    Passive exposure: Past   Smokeless tobacco: Never  Vaping Use   Vaping Use: Never used  Substance and Sexual Activity   Alcohol use: No    Alcohol/week: 0.0 standard drinks of alcohol   Drug use: No   Sexual activity: Yes    Birth control/protection: Condom  Other Topics Concern   Not on file  Social History Narrative   Not on file   Social Determinants of Health   Financial Resource Strain: Low Risk  (02/06/2021)   Overall Financial Resource Strain (CARDIA)    Difficulty of Paying  Living Expenses: Not hard at all  Food Insecurity: No Food Insecurity (02/06/2021)   Hunger Vital Sign    Worried About Running Out of Food in the Last Year: Never true    Ran Out of Food in the Last Year: Never true  Transportation Needs: No Transportation Needs (02/06/2021)   PRAPARE - Hydrologist (Medical): No    Lack of Transportation (Non-Medical): No  Physical Activity: Inactive (02/06/2021)   Exercise Vital Sign  Days of Exercise per Week: 0 days    Minutes of Exercise per Session: 0 min  Stress: No Stress Concern Present (02/06/2021)   [place]    Feeling of Stress : Not at all  Social Connections: Moderately Integrated (02/06/2021)   Social Connection and Isolation Panel [NHANES]    Frequency of Communication with Friends and Family: More than three times a week    Frequency of Social Gatherings with Friends and Family: Once a week    Attends Religious Services: 1 to 4 times per year    Active Member of [employer] or Organizations: No    Attends Music therapist: Never    Marital Status: Married   Review of systems General: negative for malaise, night sweats, fever, chills, weight loss Neck: Negative for lumps, goiter, pain and significant neck swelling Resp: Negative for cough, wheezing, dyspnea at rest CV: Negative for chest pain, leg swelling, palpitations, orthopnea GI: denies melena, hematochezia, vomiting, diarrhea, constipation, odyonophagia, early satiety or unintentional weight loss. +nausea +dysphagia +abdominal soreness MSK: Negative for joint pain or swelling, back pain, and muscle pain. Derm: Negative for itching or rash Psych: Denies depression, anxiety, memory loss, confusion. No homicidal or suicidal ideation.  Heme: Negative for prolonged bleeding, bruising easily, and swollen nodes. Endocrine: Negative for cold or heat intolerance, polyuria, polydipsia  and goiter. Neuro: negative for tremor, gait imbalance, syncope and seizures. The remainder of the review of systems is noncontributory.  Physical Exam: LMP 06/26/2021  General:   Alert and oriented. No distress noted. Pleasant and cooperative.  Head:  Normocephalic and atraumatic. Eyes:  Conjuctiva clear without scleral icterus. Mouth:  Oral mucosa pink and moist. Good dentition. No lesions. Heart: Normal rate and rhythm, s1 and s2 heart sounds present.  Lungs: Clear lung sounds in all lobes. Respirations equal and unlabored. Abdomen:  +BS, soft, non-tender and non-distended. No rebound or guarding. No HSM or masses noted. Derm: No palmar erythema or jaundice Msk:  Symmetrical without gross deformities. Normal posture. Extremities:  Without edema. Neurologic:  Alert and  oriented x4 Psych:  Alert and cooperative. Normal mood and affect.  Invalid input(s): "6 MONTHS"   ASSESSMENT: Mckenzie Cooley is a 32 y.o. female presenting today for follow up.  GERD:previously on nexium and famotidine with good control of her symptoms, though when she began having dysphagia again about 1.5 months ago, had to stop the nexium as she is unable to swallow the larger pill. Still taking famotidine 57m daily. Discussed importance of utilizing yogurt/applesauce with pills. She can open nexium capsule and mix contents into transport medium if she still unable to tolerate larger capsule. Should continue with H2B in the evenings and reflux precautions.   Dysphagia/esophageal dysmotility: extensive workup as outlined above. Last E[facility] with endoflip in march 2023 [facility] Has upcoming appt with him next month. Recent visit with allergist suggests possible reaction to red dye though no confirmatory testing done. She is inquiring about having repeat EGD here due to recurrence of her dysphagia. Discussed case with Dr. CJenetta Downerwho is in agreement to proceed with EGD +/- dilation, patient should still  [facility]. Indications, risks and benefits of procedure discussed in detail with patient. Patient verbalized understanding and is in agreement to proceed with EGD.   Constipation: well managed with linzess 767m daily. Will continue with current regimen  PLAN:  Open Nexium capsules into yogurt/applesauce  2. Continue famotidine 32m QHS  3. Chewing precautions  4. Continue linzess 725m daily  5. Schedule EGD +/- dilation 6. [facility]  7. Chewing precautions   All questions were answered, patient verbalized understanding and is in agreement with plan as outlined above.    Follow Up: 3 months  Tionna Gigante L. CaAlver SorrowMSN, APRN, AGNP-C Adult-Gerontology Nurse Practitioner [facility]  I have reviewed the note and agree with the APP's assessment as described in this progress note  DaMaylon PeppersMD Gastroenterology and Hepatology CoAdvent[facility]cala[facility]enterology

## 2022-08-04 NOTE — Patient Instructions (Signed)
I will discuss repeat EGD with Dr. Jenetta Downer on whether this can be done here or if it needs to be done by Dr. Truddie Crumble Please try emptying nexium capsule into applesauce/yogurt and continue famotidine Make sure to take small bites, chewing adequately and take sips of liquids between bites  Follow up 3 months   It was a pleasure to see you today. I want to create trusting relationships with patients and provide genuine, compassionate, and quality care. I truly value your feedback! please be on the lookout for a survey regarding your visit with me today. I appreciate your input about our visit and your time in completing this!    Mckenzie Cooley L. Alver Sorrow, MSN, APRN, AGNP-C Adult-Gerontology Nurse Practitioner [facility] Gastroenterology at [facility]

## 2022-08-06 ENCOUNTER — Ambulatory Visit (INDEPENDENT_AMBULATORY_CARE_PROVIDER_SITE_OTHER): Payer: Medicaid Other | Admitting: Gastroenterology

## 2022-08-06 ENCOUNTER — Telehealth (INDEPENDENT_AMBULATORY_CARE_PROVIDER_SITE_OTHER): Payer: Self-pay | Admitting: Gastroenterology

## 2022-08-06 ENCOUNTER — Ambulatory Visit: Payer: Medicaid Other | Admitting: Family Medicine

## 2022-08-06 DIAGNOSIS — R1319 Other dysphagia: Secondary | ICD-10-CM | POA: Insufficient documentation

## 2022-08-07 NOTE — Telephone Encounter (Signed)
LMOVM to call back 

## 2022-08-12 NOTE — Addendum Note (Signed)
Addended by: Cheron Every on: 08/12/2022 03:07 PM   Modules accepted: Orders

## 2022-08-12 NOTE — Telephone Encounter (Signed)
Spoke with pt. She has been scheduled for 2/28 at 2pm. Aware will send instructions via mychart. Aware needs to go Monday for urine preg at [facility].

## 2022-08-17 ENCOUNTER — Other Ambulatory Visit (HOSPITAL_COMMUNITY)
Admission: RE | Admit: 2022-08-17 | Discharge: 2022-08-17 | Disposition: A | Discharge status: Home / Self Care | Payer: Medicaid Other | Source: Ambulatory Visit | Attending: Gastroenterology | Admitting: Gastroenterology

## 2022-08-17 DIAGNOSIS — R1319 Other dysphagia: Secondary | ICD-10-CM | POA: Insufficient documentation

## 2022-08-17 LAB — PREGNANCY, URINE: Preg Test, Ur: NEGATIVE

## 2022-08-19 ENCOUNTER — Ambulatory Visit (HOSPITAL_COMMUNITY): Payer: Medicaid Other | Admitting: Anesthesiology

## 2022-08-19 ENCOUNTER — Encounter (HOSPITAL_COMMUNITY): Payer: Self-pay | Admitting: Gastroenterology

## 2022-08-19 ENCOUNTER — Ambulatory Visit (HOSPITAL_BASED_OUTPATIENT_CLINIC_OR_DEPARTMENT_OTHER): Payer: Medicaid Other | Admitting: Anesthesiology

## 2022-08-19 ENCOUNTER — Other Ambulatory Visit: Payer: Self-pay

## 2022-08-19 ENCOUNTER — Ambulatory Visit (HOSPITAL_COMMUNITY)
Admission: RE | Admit: 2022-08-19 | Discharge: 2022-08-19 | Disposition: A | Discharge status: Home / Self Care | Payer: Medicaid Other | Attending: Gastroenterology | Admitting: Gastroenterology

## 2022-08-19 ENCOUNTER — Encounter (HOSPITAL_COMMUNITY)
Admission: RE | Disposition: A | Discharge status: Home / Self Care | Payer: Self-pay | Source: Home / Self Care | Attending: Gastroenterology

## 2022-08-19 DIAGNOSIS — R1310 Dysphagia, unspecified: Secondary | ICD-10-CM | POA: Diagnosis not present

## 2022-08-19 DIAGNOSIS — K219 Gastro-esophageal reflux disease without esophagitis: Secondary | ICD-10-CM | POA: Diagnosis not present

## 2022-08-19 DIAGNOSIS — Z87891 Personal history of nicotine dependence: Secondary | ICD-10-CM

## 2022-08-19 DIAGNOSIS — Z79899 Other long term (current) drug therapy: Secondary | ICD-10-CM | POA: Diagnosis not present

## 2022-08-19 DIAGNOSIS — K589 Irritable bowel syndrome without diarrhea: Secondary | ICD-10-CM | POA: Diagnosis not present

## 2022-08-19 DIAGNOSIS — G709 Myoneural disorder, unspecified: Secondary | ICD-10-CM | POA: Insufficient documentation

## 2022-08-19 DIAGNOSIS — I10 Essential (primary) hypertension: Secondary | ICD-10-CM

## 2022-08-19 DIAGNOSIS — Z8719 Personal history of other diseases of the digestive system: Secondary | ICD-10-CM | POA: Diagnosis not present

## 2022-08-19 DIAGNOSIS — J45909 Unspecified asthma, uncomplicated: Secondary | ICD-10-CM | POA: Insufficient documentation

## 2022-08-19 DIAGNOSIS — R1319 Other dysphagia: Secondary | ICD-10-CM | POA: Diagnosis not present

## 2022-08-19 HISTORY — PX: ESOPHAGOGASTRODUODENOSCOPY (EGD) WITH PROPOFOL: SHX5813

## 2022-08-19 HISTORY — PX: ESOPHAGEAL DILATION: SHX303

## 2022-08-19 SURGERY — ESOPHAGOGASTRODUODENOSCOPY (EGD) WITH PROPOFOL
Anesthesia: General

## 2022-08-19 MED ORDER — LIDOCAINE HCL (PF) 2 % IJ SOLN
INTRAMUSCULAR | Status: AC
  Filled 2022-08-19: qty 5

## 2022-08-19 MED ORDER — LIDOCAINE HCL (CARDIAC) PF 100 MG/5ML IV SOSY
PREFILLED_SYRINGE | INTRAVENOUS | Status: DC | PRN
  Administered 2022-08-19: 80 mg via INTRAVENOUS

## 2022-08-19 MED ORDER — PROPOFOL 10 MG/ML IV BOLUS
INTRAVENOUS | Status: DC | PRN
  Administered 2022-08-19: 80 mg via INTRAVENOUS
  Administered 2022-08-19 (×2): 40 mg via INTRAVENOUS

## 2022-08-19 MED ORDER — LACTATED RINGERS IV SOLN: INTRAVENOUS | Status: DC

## 2022-08-19 MED ORDER — PROPOFOL 500 MG/50ML IV EMUL
INTRAVENOUS | Status: DC | PRN
  Administered 2022-08-19: 200 ug/kg/min via INTRAVENOUS

## 2022-08-19 MED ORDER — PROPOFOL 500 MG/50ML IV EMUL
INTRAVENOUS | Status: AC
  Filled 2022-08-19: qty 50

## 2022-08-19 NOTE — Anesthesia Procedure Notes (Signed)
Procedure Name: General with mask airway Date/Time: 08/19/2022 1:44 PM  Performed by: Maude Leriche, CRNAPre-anesthesia Checklist: Patient identified, Emergency Drugs available, Suction available, Patient being monitored and Timeout performed Patient Re-evaluated:Patient Re-evaluated prior to induction Oxygen Delivery Method: Nasal cannula Placement Confirmation: positive ETCO2 Dental Injury: Teeth and Oropharynx as per pre-operative assessment

## 2022-08-19 NOTE — Discharge Instructions (Signed)
You are being discharged to home.  Resume your previous diet.  Follow up with Dr. Truddie Crumble regarding EIM.

## 2022-08-19 NOTE — Anesthesia Postprocedure Evaluation (Signed)
Anesthesia Post Note  Patient: Mckenzie Cooley  Procedure(s) Performed: ESOPHAGOGASTRODUODENOSCOPY (EGD) WITH PROPOFOL ESOPHAGEAL DILATION  Patient location during evaluation: Phase II Anesthesia Type: General Level of consciousness: awake and alert and oriented Pain management: pain level controlled Vital Signs Assessment: post-procedure vital signs reviewed and stable Respiratory status: spontaneous breathing, nonlabored ventilation and respiratory function stable Cardiovascular status: blood pressure returned to baseline and stable Postop Assessment: no apparent nausea or vomiting Anesthetic complications: no  No notable events documented.   Last Vitals:  Vitals:   08/19/22 1244 08/19/22 1402  BP: 117/70 (!) 102/56  Pulse: (!) 49 (!) 56  Resp: 11 19  Temp: 36.9 C (!) 36.4 C  SpO2: 99% 97%    Last Pain:  Vitals:   08/19/22 1402  TempSrc: Oral  PainSc: 0-No pain                 Dashawn Bartnick C Rosslyn Pasion

## 2022-08-19 NOTE — Anesthesia Preprocedure Evaluation (Addendum)
Anesthesia Evaluation  Patient identified by MRN, date of birth, ID band Patient awake    Reviewed: Allergy & Precautions, H&P , NPO status , Patient's Chart, lab work & pertinent test results  Airway Mallampati: II  TM Distance: >3 FB Neck ROM: Full    Dental  (+) Dental Advisory Given, Teeth Intact   Pulmonary asthma , former smoker   Pulmonary exam normal breath sounds clear to auscultation       Cardiovascular hypertension, Pt. on medications Normal cardiovascular exam Rhythm:Regular Rate:Normal     Neuro/Psych  Headaches PSYCHIATRIC DISORDERS Anxiety Depression     Neuromuscular disease    GI/Hepatic Neg liver ROS,GERD  Medicated and Controlled,,  Endo/Other  negative endocrine ROS    Renal/GU negative Renal ROS  negative genitourinary   Musculoskeletal negative musculoskeletal ROS (+)    Abdominal   Peds  (+) ADHD Hematology  (+) Blood dyscrasia, anemia   Anesthesia Other Findings   Reproductive/Obstetrics negative OB ROS                              Anesthesia Physical Anesthesia Plan  ASA: 3  Anesthesia Plan: General   Post-op Pain Management: Minimal or no pain anticipated   Induction: Intravenous  PONV Risk Score and Plan: 1 and Propofol infusion  Airway Management Planned: Nasal Cannula and Natural Airway  Additional Equipment:   Intra-op Plan:   Post-operative Plan:   Informed Consent: I have reviewed the patients History and Physical, chart, labs and discussed the procedure including the risks, benefits and alternatives for the proposed anesthesia with the patient or authorized representative who has indicated his/her understanding and acceptance.     Dental advisory given  Plan Discussed with: CRNA and Surgeon  Anesthesia Plan Comments:         Anesthesia Quick Evaluation

## 2022-08-19 NOTE — Interval H&P Note (Signed)
History and Physical Interval Note:  08/19/2022 12:29 PM  Mckenzie Cooley  has presented today for surgery, with the diagnosis of DYSPHAGIA.  The various methods of treatment have been discussed with the patient and family. After consideration of risks, benefits and other options for treatment, the patient has consented to  Procedure(s) with comments: ESOPHAGOGASTRODUODENOSCOPY (EGD) WITH PROPOFOL (N/A) - 200pm, asa 2 ESOPHAGEAL DILATION (N/A) as a surgical intervention.  The patient's history has been reviewed, patient examined, no change in status, stable for surgery.  I have reviewed the patient's chart and labs.  Questions were answered to the patient's satisfaction.     [name]

## 2022-08-19 NOTE — Op Note (Signed)
[facility] Patient Name: Mckenzie Cooley Procedure Date: 08/19/2022 1:34 PM MRN: [id number] Date of Birth: 11-09-1990 Attending MD: Maylon Peppers , , [id number] CSN: [id number] Age: 32 Admit Type: Outpatient Procedure:                Upper GI endoscopy Indications:              Dysphagia, History of ineffective esophageal                            motility Providers:                Maylon Peppers, Rosina Lowenstein, RN, Lurline Del, RN Referring MD:              Medicines:                Monitored Anesthesia Care Complications:            No immediate complications. Estimated Blood Loss:     Estimated blood loss: none. Procedure:                Pre-Anesthesia Assessment:                           - Prior to the procedure, a History and Physical                            was performed, and patient medications, allergies                            and sensitivities were reviewed. The patient's                            tolerance of previous anesthesia was reviewed.                           - The risks and benefits of the procedure and the                            sedation options and risks were discussed with the                            patient. All questions were answered and informed                            consent was obtained.                           - ASA Grade Assessment: II - A patient with mild                            systemic disease.                           After obtaining informed consent, the endoscope was                            passed under direct vision. Throughout the  procedure, the patient's blood pressure, pulse, and                            oxygen saturations were monitored continuously. The                            [id number] scope was introduced through the                            mouth, and advanced to the second part of duodenum.                            The upper GI endoscopy was accomplished  without                            difficulty. The patient tolerated the procedure                            well. Scope In: 1:46:17 PM Scope Out: 1:52:29 PM Total Procedure Duration: 0 hours 6 minutes 12 seconds  Findings:      No endoscopic abnormality was evident in the esophagus to explain the       patient's complaint of dysphagia. It was decided, however, to proceed       with dilation of the entire esophagus. A guidewire was placed and the       scope was withdrawn. Dilation was performed with a Savary dilator with       no resistance at 18 mm. The dilation site was examined and showed no       change.      The stomach was normal.      The examined duodenum was normal. Impression:               - No endoscopic/luminal esophageal abnormality to                            explain patient's dysphagia. Esophagus dilated. .                           - Normal stomach.                           - Normal examined duodenum.                           - No specimens collected. Moderate Sedation:      Per Anesthesia Care Recommendation:           - Discharge patient to home (ambulatory).                           - Resume previous diet.                           - Follow up with Dr. Truddie Crumble regarding EIM. Procedure Code(s):        --- Professional ---  43248, Esophagogastroduodenoscopy, flexible,                            transoral; with insertion of guide wire followed by                            passage of dilator(s) through esophagus over guide                            wire Diagnosis Code(s):        --- Professional ---                           R13.10, Dysphagia, unspecified CPT copyright 2022 American Medical Association. All rights reserved. The codes documented in this report are preliminary and upon coder review may  be revised to meet current compliance requirements. Maylon Peppers, MD Maylon Peppers,  08/19/2022 1:59:11 PM This report has  been signed electronically. Number of Addenda: 0

## 2022-08-19 NOTE — Transfer of Care (Signed)
Immediate Anesthesia Transfer of Care Note  Patient: Mckenzie Cooley  Procedure(s) Performed: ESOPHAGOGASTRODUODENOSCOPY (EGD) WITH PROPOFOL ESOPHAGEAL DILATION  Patient Location: PACU  Anesthesia Type:General  Level of Consciousness: awake, alert , and oriented  Airway & Oxygen Therapy: Patient Spontanous Breathing  Post-op Assessment: Report given to RN, Post -op Vital signs reviewed and stable, Patient moving all extremities X 4, and Patient able to stick tongue midline  Post vital signs: Reviewed  Last Vitals:  Vitals Value Taken Time  BP 102/56 08/19/22 1402  Temp 36.4 C 08/19/22 1402  Pulse 56 08/19/22 1402  Resp 19 08/19/22 1402  SpO2 97 % 08/19/22 1402    Last Pain:  Vitals:   08/19/22 1402  TempSrc: Oral  PainSc: 0-No pain      Patients Stated Pain Goal: 4 ([id number])  Complications: No notable events documented.

## 2022-08-26 ENCOUNTER — Encounter: Payer: Self-pay | Admitting: Family Medicine

## 2022-08-26 ENCOUNTER — Other Ambulatory Visit (HOSPITAL_COMMUNITY)
Admission: RE | Admit: 2022-08-26 | Discharge: 2022-08-26 | Disposition: A | Discharge status: Home / Self Care | Payer: Medicaid Other | Source: Ambulatory Visit | Attending: Family Medicine | Admitting: Family Medicine

## 2022-08-26 ENCOUNTER — Telehealth: Payer: Medicaid Other | Admitting: Family Medicine

## 2022-08-26 DIAGNOSIS — N898 Other specified noninflammatory disorders of vagina: Secondary | ICD-10-CM | POA: Insufficient documentation

## 2022-08-26 LAB — WET PREP FOR TRICH, YEAST, CLUE
Clue Cell Exam: NEGATIVE
Trichomonas Exam: NEGATIVE
Yeast Exam: NEGATIVE

## 2022-08-26 LAB — URINALYSIS, ROUTINE W REFLEX MICROSCOPIC
Bilirubin, UA: NEGATIVE
Glucose, UA: NEGATIVE
Leukocytes,UA: NEGATIVE
Nitrite, UA: NEGATIVE
Protein,UA: NEGATIVE
RBC, UA: NEGATIVE
Specific Gravity, UA: >1.03 — ABNORMAL HIGH (ref 1.005–1.030)
Urobilinogen, Ur: 0.2 mg/dL (ref 0.2–1.0)
pH, UA: 5.5 (ref 5.0–7.5)

## 2022-08-26 LAB — MICROSCOPIC EXAMINATION
RBC, Urine: NONE SEEN /hpf (ref 0–2)
Renal Epithel, UA: NONE SEEN /hpf
WBC, UA: NONE SEEN /hpf (ref 0–5)

## 2022-08-26 LAB — PREGNANCY, URINE: Preg Test, Ur: NEGATIVE

## 2022-08-26 NOTE — Addendum Note (Signed)
Addended by: Baruch Gouty on: 08/26/2022 04:38 PM   Modules accepted: Orders

## 2022-08-26 NOTE — Progress Notes (Signed)
Virtual Visit via telephone Note Due to COVID-19 pandemic this visit was conducted virtually. This visit type was conducted due to national recommendations for restrictions regarding the COVID-19 Pandemic (e.g. social distancing, sheltering in place) in an effort to limit this patient's exposure and mitigate transmission in our community. All issues noted in this document were discussed and addressed.  A physical exam was not performed with this format.   I connected with Mckenzie Cooley on 08/26/2022 at 55 by telephone and verified that I am speaking with the correct person using two identifiers. Mckenzie Cooley is currently located at home and patient is currently with them during visit. The provider, Monia Pouch, FNP is located in their office at time of visit.  I discussed the limitations, risks, security and privacy concerns of performing an evaluation and management service by virtual visit and the availability of in person appointments. I also discussed with the patient that there may be a patient responsible charge related to this service. The patient expressed understanding and agreed to proceed.  Subjective:  Patient ID: Mckenzie Cooley, female    DOB: [birth year], 32 y.o.   MRN: [id number]  Chief Complaint:  Vaginal Discharge   HPI: Mckenzie Cooley is a 32 y.o. female presenting on 08/26/2022 for Vaginal Discharge   Pt reports vaginal discharge, frequency and lower abdominal pain. No fever, chills, weakness, joint swelling, confusion, or myalgias. She states she does not think she is pregnant but is not using anything for contraception.   Vaginal Discharge The patient's primary symptoms include vaginal discharge. The patient's pertinent negatives include no genital itching, genital lesions, genital odor, genital rash, missed menses, pelvic pain or vaginal bleeding. This is a new problem. The current episode started in the past 7 days. The pain is mild. Associated symptoms include  abdominal pain and frequency. Pertinent negatives include no anorexia, back pain, chills, constipation, diarrhea, discolored urine, dysuria, fever, flank pain, headaches, hematuria, joint pain, joint swelling, nausea, painful intercourse, rash, sore throat, urgency or vomiting. The vaginal discharge was yellow, thick and brown. Nothing aggravates the symptoms. She has tried nothing for the symptoms. She is sexually active. No, her partner does not have an STD. She uses nothing for contraception. Her menstrual history has been regular.     Relevant past medical, surgical, family, and social history reviewed and updated as indicated.  Allergies and medications reviewed and updated.   Past Medical History:  Diagnosis Date   Acid reflux    Anemia    Bloating    Childhood asthma    Eczema    Esophageal dysmotility    History of blood transfusion    Positive urine pregnancy test 12/14/2018   Preeclampsia    Seasonal allergies     Past Surgical History:  Procedure Laterality Date   BIOPSY  02/26/2021   Procedure: BIOPSY;  Surgeon: Rogene Houston, MD;  [facility];  Service: Endoscopy;;  Esophageal biopsy   carpal tunnel Left 01/30/2021   CARPAL TUNNEL RELEASE Right 03/27/2021   CESAREAN SECTION N/A 01/02/2017   Procedure: CESAREAN SECTION;  Surgeon: Chancy Mil[place], MD;  Location: [facility];  Service: Obstetrics;  Laterality: N/A;   CHOLECYSTECTOMY  2011   Dr.Byerly   COLONOSCOPY WITH PROPOFOL N/A 05/21/2022   Procedure: COLONOSCOPY WITH PROPOFOL;  Surgeon: Harvel Quale, MD;  [facility]: [facility];  Service: Gastroenterology;  Laterality: N/A;  2:00pm, asa 2   ESOPHAGEAL DILATION N/A 02/26/2021   Procedure:  ESOPHAGEAL DILATION;  Surgeon: Rogene Houston, MD;  [facility];  Service: Endoscopy;  Laterality: N/A;   ESOPHAGOGASTRODUODENOSCOPY N/A 10/26/2014   Procedure: ESOPHAGOGASTRODUODENOSCOPY (EGD);  Surgeon: Rogene Houston, MD;   [facility];  Service: Endoscopy;  Laterality: N/A;   ESOPHAGOGASTRODUODENOSCOPY (EGD) WITH PROPOFOL N/A 02/26/2021   Procedure: ESOPHAGOGASTRODUODENOSCOPY (EGD) WITH PROPOFOL;  Surgeon: Rogene Houston, MD;  [facility];  Service: Endoscopy;  Laterality: N/A;   POLYPECTOMY  02/26/2021   Procedure: POLYPECTOMY;  Surgeon: Rogene Houston, MD;  [facility];  Service: Endoscopy;;  Gastric Body Polyps x 3    TYMPANOSTOMY TUBE PLACEMENT     UPPER GASTROINTESTINAL ENDOSCOPY  12/05/2009   UPPER GASTROINTESTINAL ENDOSCOPY  02/07/2009   WISDOM TOOTH EXTRACTION     WISDOM TOOTH EXTRACTION      Social History   Socioeconomic History   Marital status: Married    Spouse name: Not on file   Number of children: 1   Years of education: Not on file   Highest education level: Not on file  Occupational History   Not on file  Tobacco Use   Smoking status: Former    Packs/day: 0.50    Years: 12.00    Total pack years: 6.00    Types: Cigarettes    Quit date: 05/05/2016    Years since quitting: 6.3    Passive exposure: Past   Smokeless tobacco: Never  Vaping Use   Vaping Use: Never used  Substance and Sexual Activity   Alcohol use: No    Alcohol/week: 0.0 standard drinks of alcohol   Drug use: No   Sexual activity: Yes    Birth control/protection: Condom  Other Topics Concern   Not on file  Social History Narrative   Not on file   Social Determinants of Health   Financial Resource Strain: Low Risk  (02/06/2021)   Overall Financial Resource Strain (CARDIA)    Difficulty of Paying Living Expenses: Not hard at all  Food Insecurity: No Food Insecurity (02/06/2021)   Hunger Vital Sign    Worried About Running Out of Food in the Last Year: Never true    Ran Out of Food in the Last Year: Never true  Transportation Needs: No Transportation Needs (02/06/2021)   PRAPARE - Hydrologist (Medical): No    Lack of Transportation  (Non-Medical): No  Physical Activity: Inactive (02/06/2021)   Exercise Vital Sign    Days of Exercise per Week: 0 days    Minutes of Exercise per Session: 0 min  Stress: No Stress Concern Present (02/06/2021)   [place]    Feeling of Stress : Not at all  Social Connections: Moderately Integrated (02/06/2021)   Social Connection and Isolation Panel [NHANES]    Frequency of Communication with Friends and Family: More than three times a week    Frequency of Social Gatherings with Friends and Family: Once a week    Attends Religious Services: 1 to 4 times per year    Active Member of [employer] or Organizations: No    Attends Archivist Meetings: Never    Marital Status: Married  Human resources officer Violence: Not At Risk (02/06/2021)   Humiliation, Afraid, Rape, and Kick questionnaire    Fear of Current or Ex-Partner: No    Emotionally Abused: No    Physically Abused: No    Sexually Abused: No  Outpatient Encounter Medications as of 08/26/2022  Medication Sig   acetaminophen (TYLENOL) 500 MG tablet Take 1,000 mg by mouth every 8 (eight) hours as needed for moderate pain. TAKES IN LIQUID FORM   dicyclomine (BENTYL) 10 MG capsule Take 1 capsule (10 mg total) by mouth 4 (four) times daily -  before meals and at bedtime. (Patient taking differently: Take 10 mg by mouth daily.)   esomeprazole (NEXIUM) 20 MG capsule TAKE ONE CAPSULE ONCE DAILY BEFORE BREAKFAST   famotidine (PEPCID) 40 MG tablet Take 40 mg by mouth daily.   linaclotide (LINZESS) 72 MCG capsule Take 1 capsule (72 mcg total) by mouth daily before breakfast.   triamcinolone ointment (KENALOG) 0.1 % Apply 1 Application topically 2 (two) times daily as needed (rash flare). Do not use on the face, neck, armpits or groin area. Do not use more than 3 weeks in a row.   No facility-administered encounter medications on file as of 08/26/2022.    Allergies  Allergen  Reactions   Percocet [Oxycodone-Acetaminophen] Hives and Itching   Prilosec [Omeprazole] Other (See Comments)    Stomach cramp   Amoxicillin Rash    Has patient had a PCN reaction causing immediate rash, facial/tongue/throat swelling, SOB or lightheadedness with hypotension: Yes -mild rash Has patient had a PCN reaction causing severe rash involving mucus membranes or skin necrosis: No Has patient had a PCN reaction that required hospitalization: No Has patient had a PCN reaction occurring within the last 10 years: Yes If all of the above answers are "NO", then may proceed with Cephalosporin use. Has tolerated ceftriaxone & cephalexin     Review of Systems  Constitutional:  Negative for activity change, appetite change, chills, fatigue, fever and unexpected weight change.  HENT:  Negative for sore throat.   Eyes:  Negative for photophobia and visual disturbance.  Respiratory:  Negative for cough and shortness of breath.   Cardiovascular:  Negative for chest pain, palpitations and leg swelling.  Gastrointestinal:  Positive for abdominal pain. Negative for abdominal distention, anal bleeding, anorexia, blood in stool, constipation, diarrhea, nausea and vomiting.  Genitourinary:  Positive for frequency and vaginal discharge. Negative for decreased urine volume, difficulty urinating, dyspareunia, dysuria, enuresis, flank pain, genital sores, hematuria, menstrual problem, missed menses, pelvic pain, urgency, vaginal bleeding and vaginal pain.  Musculoskeletal:  Negative for back pain and joint pain.  Skin:  Negative for rash.  Neurological:  Negative for dizziness, weakness and headaches.  Psychiatric/Behavioral:  Negative for confusion.   All other systems reviewed and are negative.        Observations/Objective: No vital signs or physical exam, this was a virtual health encounter.  Pt alert and oriented, answers all questions appropriately, and able to speak in full sentences.     Assessment and Plan: Mckenzie Cooley was seen today for vaginal discharge.  Diagnoses and all orders for this visit:  Vaginal discharge Vaginal discharge with urinary frequency and lower abdominal pain. Will obtain urine cytology, urine pregnancy, Wet prep, and urinalysis for further evaluation. Treatment pending results. No red flags concerning for PID or acute pyelonephritis.  -     Urine cytology ancillary only -     Urinalysis, Routine w reflex microscopic -     WET PREP FOR TRICH, YEAST, CLUE -     Pregnancy, urine     Follow Up Instructions: Return if symptoms worsen or fail to improve.    I discussed the assessment and treatment plan with the patient. The patient was  provided an opportunity to ask questions and all were answered. The patient agreed with the plan and demonstrated an understanding of the instructions.   The patient was advised to call back or seek an in-person evaluation if the symptoms worsen or if the condition fails to improve as anticipated.  The above assessment and management plan was discussed with the patient. The patient verbalized understanding of and has agreed to the management plan. Patient is aware to call the clinic if they develop any new symptoms or if symptoms persist or worsen. Patient is aware when to return to the clinic for a follow-up visit. Patient educated on when it is appropriate to go to the emergency department.    I provided 15 minutes of time during this telephone encounter.   Monia Pouch, FNP-C [facility] [address] [place], [place] [address] [phone] 08/26/2022

## 2022-08-28 LAB — URINE CYTOLOGY ANCILLARY ONLY
Candida Urine: NEGATIVE
Chlamydia: NEGATIVE
Comment: NEGATIVE
Comment: NEGATIVE
Comment: NORMAL
Neisseria Gonorrhea: NEGATIVE
Trichomonas: NEGATIVE

## 2022-08-28 LAB — URINE CULTURE

## 2022-08-31 ENCOUNTER — Encounter (HOSPITAL_COMMUNITY): Payer: Self-pay | Admitting: Gastroenterology

## 2022-09-08 ENCOUNTER — Encounter: Payer: Self-pay | Admitting: Family

## 2022-09-08 ENCOUNTER — Telehealth: Payer: Medicaid Other | Admitting: Family

## 2022-09-08 DIAGNOSIS — B9689 Other specified bacterial agents as the cause of diseases classified elsewhere: Secondary | ICD-10-CM

## 2022-09-08 DIAGNOSIS — H109 Unspecified conjunctivitis: Secondary | ICD-10-CM | POA: Diagnosis not present

## 2022-09-08 DIAGNOSIS — J208 Acute bronchitis due to other specified organisms: Secondary | ICD-10-CM | POA: Diagnosis not present

## 2022-09-08 MED ORDER — POLYMYXIN B-TRIMETHOPRIM 10000-0.1 UNIT/ML-% OP SOLN: 1.0000 [drp] | Freq: Four times a day (QID) | OPHTHALMIC | 0 refills | Status: DC

## 2022-09-08 MED ORDER — DOXYCYCLINE HYCLATE 100 MG PO TABS: 100.0000 mg | ORAL_TABLET | Freq: Two times a day (BID) | ORAL | 0 refills | Status: DC

## 2022-09-08 NOTE — Progress Notes (Signed)
Virtual Visit Consent   Mckenzie Cooley, you are scheduled for a virtual visit with a [facility] provider today. Just as with appointments in the office, your consent must be obtained to participate. Your consent will be active for this visit and any virtual visit you may have with one of our providers in the next 365 days. If you have a MyChart account, a copy of this consent can be sent to you electronically.  As this is a virtual visit, video technology does not allow for your provider to perform a traditional examination. This may limit your provider's ability to fully assess your condition. If your provider identifies any concerns that need to be evaluated in person or the need to arrange testing (such as labs, EKG, etc.), we will make arrangements to do so. Although advances in technology are sophisticated, we cannot ensure that it will always work on either your end or our end. If the connection with a video visit is poor, the visit may have to be switched to a telephone visit. With either a video or telephone visit, we are not always able to ensure that we have a secure connection.  By engaging in this virtual visit, you consent to the provision of healthcare and authorize for your insurance to be billed (if applicable) for the services provided during this visit. Depending on your insurance coverage, you may receive a charge related to this service.  I need to obtain your verbal consent now. Are you willing to proceed with your visit today? Mckenzie Cooley has provided verbal consent on 09/08/2022 for a virtual visit (video or telephone). Evelina Dun, FNP  Date: 09/08/2022 10:06 AM  Virtual Visit via Video Note   I, Evelina Dun, connected with  Mckenzie Cooley  ([id number], 06-12-1991) on 09/08/22 at 10:35 AM EDT by a video-enabled telemedicine application and verified that I am speaking with the correct person using two identifiers.  Location: Patient: Virtual Visit Location Patient:  Home Provider: Virtual Visit Location Provider: Home Office   I discussed the limitations of evaluation and management by telemedicine and the availability of in person appointments. The patient expressed understanding and agreed to proceed.    History of Present Illness: Mckenzie Cooley is a 32 y.o. who identifies as a female who was assigned female at birth, and is being seen today for congestion and cough for three weeks .  HPI: Cough This is a new problem. The current episode started 1 to 4 weeks ago. The problem has been gradually worsening. The problem occurs every few minutes. The cough is Productive of sputum and productive of purulent sputum. Associated symptoms include eye redness, a fever (100.4), headaches, myalgias, nasal congestion and a sore throat. Pertinent negatives include no chills, ear congestion, ear pain, shortness of breath or wheezing. She has tried rest and OTC cough suppressant for the symptoms. The treatment provided mild relief.  Conjunctivitis  The current episode started yesterday. The onset was sudden. The problem occurs continuously. Associated symptoms include a fever (100.4), eye itching, photophobia, congestion, headaches, sore throat, cough, eye discharge, eye pain and eye redness. Pertinent negatives include no ear pain and no wheezing.    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   Attention deficit hyperactivity disorder (ADHD), predominantly inattentive type 02/20/2022   History of bipolar disorder 02/20/2022   GAD (generalized anxiety  disorder) 08/26/2021   Current moderate 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 episode ([place] [facility] [place]) 08/26/2021   Esophageal dysmotility    RUQ abdominal pain    Vaginal irritation 04/22/2021   Vaginal discharge 04/01/2021   Routine general medical examination at a health care facility 02/06/2021   Patient desires pregnancy 02/06/2021   Bilateral carpal tunnel syndrome 11/06/2020   Spotting 12/14/2018   Carpal tunnel syndrome, left upper limb 06/01/2018   Migraine 12/09/2017   Iron deficiency 07/20/2017   Allergic otitis media of both ears 03/24/2017   History of severe pre-eclampsia 02/23/2017   History of anemia 02/23/2017   Previous cesarean section 02/09/2017   Postoperative anemia due to acute blood loss 01/06/2017   Constipation 10/27/2014   Gastroesophageal reflux disease 07/01/2011    Allergies:  Allergies  Allergen Reactions   Percocet [Oxycodone-Acetaminophen] Hives and Itching   Prilosec [Omeprazole] Other (See Comments)    Stomach cramp   Amoxicillin Rash    Has patient had a PCN reaction causing immediate rash, facial/tongue/throat swelling, SOB or lightheadedness with hypotension: Yes -mild rash Has patient had a PCN reaction causing severe rash involving mucus membranes or skin necrosis: No Has patient had a PCN reaction that required hospitalization: No Has patient had a PCN reaction occurring within the last 10 years: Yes If all of the above answers are "NO", then may proceed with Cephalosporin use. Has tolerated ceftriaxone & cephalexin    Medications:  Current Outpatient Medications:    doxycycline (VIBRA-TABS) 100 MG tablet, Take 1 tablet (100 mg total) by mouth 2 (two) times daily., Disp: 20 tablet, Rfl: 0   trimethoprim-polymyxin b (POLYTRIM) ophthalmic solution, Place 1 drop into the left eye every 6 (six) hours., Disp: 10 mL, Rfl: 0   acetaminophen (TYLENOL) 500 MG tablet, Take 1,000 mg by mouth every 8 (eight) hours as needed for moderate pain.  TAKES IN LIQUID FORM, Disp: , Rfl:    dicyclomine (BENTYL) 10 MG capsule, Take 1 capsule (10 mg total) by mouth 4 (four) times daily -  before meals and at bedtime. (Patient taking differently: Take 10 mg by mouth daily.), Disp: 120 capsule, Rfl: 5   esomeprazole (NEXIUM) 20 MG capsule, TAKE ONE CAPSULE ONCE DAILY BEFORE BREAKFAST, Disp: 30 capsule, Rfl: 12   famotidine (PEPCID) 40 MG tablet, Take 40 mg by mouth daily., Disp: , Rfl:    linaclotide (LINZESS) 72 MCG capsule, Take 1 capsule (72 mcg total) by mouth daily before breakfast., Disp: 90 capsule, Rfl: 1   triamcinolone ointment (KENALOG) 0.1 %, Apply 1 Application topically 2 (two) times daily as needed (rash flare). Do not use on the face, neck, armpits or groin area. Do not use more than 3 weeks in a row., Disp: 30 g, Rfl: 2  Observations/Objective: Patient is well-developed, well-nourished in no acute distress.  Resting comfortably  at home.  Head is normocephalic, atraumatic.  No labored breathing.  Speech is clear and coherent with logical content.  Patient is alert and oriented at baseline.  Congestion and cough  Left eye redness and discharge  Assessment and Plan: 1. Acute bacterial bronchitis - doxycycline (VIBRA-TABS) 100 MG tablet; Take 1 tablet (100 mg total) by mouth 2 (two) times daily.  Dispense: 20 tablet; Refill:  0  2. Bacterial conjunctivitis - trimethoprim-polymyxin b (POLYTRIM) ophthalmic solution; Place 1 drop into the left eye every 6 (six) hours.  Dispense: 10 mL; Refill: 0  - Take meds as prescribed - Use a cool mist humidifier  -Use saline nose sprays frequently -Force fluids -For any cough or congestion  Use plain Mucinex- regular strength or max strength is fine -For fever or aces or pains- take tylenol or ibuprofen. -Throat lozenges if help -Good hand hygiene  FOllow up if symptoms worsen or do not improve    Follow Up Instructions: I discussed the assessment and treatment plan with the patient.  The patient was provided an opportunity to ask questions and all were answered. The patient agreed with the plan and demonstrated an understanding of the instructions.  A copy of instructions were sent to the patient via MyChart unless otherwise noted below.   Patient has requested to receive PHI (AVS, Work Notes, etc) pertaining to this video visit through e-mail as they are currently without active [place]. They have voiced understand that email is not considered secure and their health information could be viewed by someone other than the patient.   The patient was advised to call back or seek an in-person evaluation if the symptoms worsen or if the condition fails to improve as anticipated.  Time:  I spent  7 minutes with the patient via telehealth technology discussing the above problems/concerns.    Evelina Dun, FNP

## 2022-10-09 ENCOUNTER — Ambulatory Visit: Payer: Medicaid Other | Admitting: Family

## 2022-10-09 ENCOUNTER — Encounter: Payer: Self-pay | Admitting: Family

## 2022-10-09 VITALS — BP 139/85 | HR 62 | Temp 98.0°F | Ht 60.0 in | Wt 216.0 lb

## 2022-10-09 DIAGNOSIS — F411 Generalized anxiety disorder: Secondary | ICD-10-CM | POA: Diagnosis not present

## 2022-10-09 DIAGNOSIS — R110 Nausea: Secondary | ICD-10-CM

## 2022-10-09 DIAGNOSIS — R42 Dizziness and giddiness: Secondary | ICD-10-CM

## 2022-10-09 DIAGNOSIS — R5383 Other fatigue: Secondary | ICD-10-CM

## 2022-10-09 DIAGNOSIS — R6889 Other general symptoms and signs: Secondary | ICD-10-CM | POA: Diagnosis not present

## 2022-10-09 MED ORDER — ONDANSETRON HCL 4 MG PO TABS: 4.0000 mg | ORAL_TABLET | Freq: Three times a day (TID) | ORAL | 0 refills | Status: DC | PRN

## 2022-10-09 NOTE — Patient Instructions (Signed)
Dizziness Dizziness is a common problem. It is a feeling of unsteadiness or light-headedness. You may feel like you are about to faint. Dizziness can lead to injury if you stumble or fall. Anyone can become dizzy, but dizziness is more common in older adults. This condition can be caused by a number of things, including medicines, dehydration, or illness. Follow these instructions at home: Eating and drinking  Drink enough fluid to keep your urine pale yellow. This helps to keep you from becoming dehydrated. Try to drink more clear fluids, such as water. Do not drink alcohol. Limit your caffeine intake if told to do so by your health care provider. Check ingredients and nutrition facts to see if a food or beverage contains caffeine. Limit your salt (sodium) intake if told to do so by your health care provider. Check ingredients and nutrition facts to see if a food or beverage contains sodium. Activity  Avoid making quick movements. Rise slowly from chairs and steady yourself until you feel okay. In the morning, first sit up on the side of the bed. When you feel okay, stand slowly while you hold onto something until you know that your balance is good. If you need to stand in one place for a long time, move your legs often. Tighten and relax the muscles in your legs while you are standing. Do not drive or use machinery if you feel dizzy. Avoid bending down if you feel dizzy. Place items in your home so that they are easy for you to reach without leaning over. Lifestyle Do not use any products that contain nicotine or tobacco. These products include cigarettes, chewing tobacco, and vaping devices, such as e-cigarettes. If you need help quitting, ask your health care provider. Try to reduce your stress level by using methods such as yoga or meditation. Talk with your health care provider if you need help to manage your stress. General instructions Watch your dizziness for any changes. Take  over-the-counter and prescription medicines only as told by your health care provider. Talk with your health care provider if you think that your dizziness is caused by a medicine that you are taking. Tell a friend or a family member that you are feeling dizzy. If he or she notices any changes in your behavior, have this person call your health care provider. Keep all follow-up visits. This is important. Contact a health care provider if: Your dizziness does not go away or you have new symptoms. Your dizziness or light-headedness gets worse. You feel nauseous. You have reduced hearing. You have a fever. You have neck pain or a stiff neck. Your dizziness leads to an injury or a fall. Get help right away if: You vomit or have diarrhea and are unable to eat or drink anything. You have problems talking, walking, swallowing, or using your arms, hands, or legs. You feel generally weak. You have any bleeding. You are not thinking clearly or you have trouble forming sentences. It may take a friend or family member to notice this. You have chest pain, abdominal pain, shortness of breath, or sweating. Your vision changes or you develop a severe headache. These symptoms may represent a serious problem that is an emergency. Do not wait to see if the symptoms will go away. Get medical help right away. Call your local emergency services (911 in the [place] Do not drive yourself to the hospital. Summary Dizziness is a feeling of unsteadiness or light-headedness. This condition can be caused by a number of   things, including medicines, dehydration, or illness. Anyone can become dizzy, but dizziness is more common in older adults. Drink enough fluid to keep your urine pale yellow. Do not drink alcohol. Avoid making quick movements if you feel dizzy. Monitor your dizziness for any changes. This information is not intended to replace advice given to you by your health care provider. Make sure you discuss any  questions you have with your health care provider. Document Revised: 05/13/2020 Document Reviewed: 05/13/2020 Elsevier Patient Education  2023 Elsevier Inc.  

## 2022-10-09 NOTE — Progress Notes (Signed)
Subjective:    Patient ID: Mckenzie Cooley, female    DOB: October 10, 1990, 32 y.o.   MRN: [id number]  Chief Complaint  Patient presents with   Headache    Been going on for about 3 weeks.    Hot Flashes   Nausea   Eye Drainage   Fatigue   Anxiety   PT presents to the office today with multiple complaints. She is feeling nausea and the room is spinning. Unsure if this vertigo or anxiety.   She reports she started her menstrual cycle today.  Headache  Associated symptoms include dizziness and nausea. Pertinent negatives include no coughing, fever or vomiting.  Anxiety Presents for follow-up visit. Symptoms include dizziness, excessive worry, nausea and restlessness. Symptoms occur most days. The severity of symptoms is moderate.    Dizziness This is a new problem. The current episode started 1 to 4 weeks ago. The problem occurs intermittently. The problem has been waxing and waning. Associated symptoms include fatigue, headaches and nausea. Pertinent negatives include no chills, congestion, coughing, fever or vomiting. The symptoms are aggravated by bending, swallowing and standing. She has tried rest for the symptoms. The treatment provided moderate relief.      Review of Systems  Constitutional:  Positive for fatigue. Negative for chills and fever.  HENT:  Negative for congestion.   Respiratory:  Negative for cough.   Gastrointestinal:  Positive for nausea. Negative for vomiting.  Neurological:  Positive for dizziness and headaches.  All other systems reviewed and are negative.      Objective:   Physical Exam Vitals reviewed.  Constitutional:      General: She is not in acute distress.    Appearance: She is well-developed. She is obese.  HENT:     Head: Normocephalic and atraumatic.     Right Ear: External ear normal.  Eyes:     Pupils: Pupils are equal, round, and reactive to light.  Neck:     Thyroid: No thyromegaly.  Cardiovascular:     Rate and Rhythm: Normal  rate and regular rhythm.     Heart sounds: Normal heart sounds. No murmur heard. Pulmonary:     Effort: Pulmonary effort is normal. No respiratory distress.     Breath sounds: Normal breath sounds. No wheezing.  Abdominal:     General: Bowel sounds are normal. There is no distension.     Palpations: Abdomen is soft.     Tenderness: There is no abdominal tenderness.  Musculoskeletal:        General: No tenderness. Normal range of motion.     Cervical back: Normal range of motion and neck supple.  Skin:    General: Skin is warm and dry.  Neurological:     Mental Status: She is alert and oriented to person, place, and time.     Cranial Nerves: No cranial nerve deficit.     Deep Tendon Reflexes: Reflexes are normal and symmetric.  Psychiatric:        Behavior: Behavior normal.        Thought Content: Thought content normal.        Judgment: Judgment normal.       BP 139/85   Pulse 62   Temp 98 F (36.7 C)   Ht 5' (1.524 m)   Wt 216 lb (98 kg)   SpO2 98%   BMI 42.18 kg/m      Assessment & Plan:  CAYDANCE Mckenzie Cooley comes in today with chief complaint of Headache (  Been going on for about 3 weeks. ), Hot Flashes, Nausea, Eye Drainage, Fatigue, and Anxiety   Diagnosis and orders addressed:  1. GAD (generalized anxiety disorder) - Anemia Profile B - CMP14+EGFR  2. Dizziness - Anemia Profile B - CMP14+EGFR - TSH  3. Other fatigue - Anemia Profile B - CMP14+EGFR - TSH  4. Nausea - CMP14+EGFR - TSH - ondansetron (ZOFRAN) 4 MG tablet; Take 1 tablet (4 mg total) by mouth every 8 (eight) hours as needed for nausea or vomiting.  Dispense: 20 tablet; Refill: 0   Labs pending to rule out thyroid  Zofran as needed  Fall precautions   Follow up plan: Keep follow up with PCP   Jannifer Rodney, FNP

## 2022-10-10 LAB — ANEMIA PROFILE B
Basophils Absolute: 0.1 10*3/uL (ref 0.0–0.2)
Basos: 1 %
EOS (ABSOLUTE): 0.1 10*3/uL (ref 0.0–0.4)
Eos: 1 %
Ferritin: 80 ng/mL (ref 15–150)
Folate: 13.8 ng/mL (ref >3.0)
Hematocrit: 39.3 % (ref 34.0–46.6)
Hemoglobin: 13.2 g/dL (ref 11.1–15.9)
Immature Grans (Abs): 0 10*3/uL (ref 0.0–0.1)
Immature Granulocytes: 0 %
Iron Saturation: 11 % — ABNORMAL LOW (ref 15–55)
Iron: 36 ug/dL (ref 27–159)
Lymphocytes Absolute: 2.7 10*3/uL (ref 0.7–3.1)
Lymphs: 27 %
MCH: 28.2 pg (ref 26.6–33.0)
MCHC: 33.6 g/dL (ref 31.5–35.7)
MCV: 84 fL (ref 79–97)
Monocytes Absolute: 0.7 10*3/uL (ref 0.1–0.9)
Monocytes: 7 %
Neutrophils Absolute: 6.4 10*3/uL (ref 1.4–7.0)
Neutrophils: 64 %
Platelets: 319 10*3/uL (ref 150–450)
RBC: 4.68 x10E6/uL (ref 3.77–5.28)
RDW: 12.9 % (ref 11.7–15.4)
Retic Ct Pct: 1.4 % (ref 0.6–2.6)
Total Iron Binding Capacity: 321 ug/dL (ref 250–450)
UIBC: 285 ug/dL (ref 131–425)
Vitamin B-12: 476 pg/mL (ref 232–1245)
WBC: 10 10*3/uL (ref 3.4–10.8)

## 2022-10-10 LAB — CMP14+EGFR
ALT: 26 IU/L (ref 0–32)
AST: 19 IU/L (ref 0–40)
Albumin/Globulin Ratio: 1.5 (ref 1.2–2.2)
Albumin: 4.3 g/dL (ref 3.9–4.9)
Alkaline Phosphatase: 78 IU/L (ref 44–121)
BUN/Creatinine Ratio: 15 (ref 9–23)
BUN: 12 mg/dL (ref 6–20)
Bilirubin Total: 0.6 mg/dL (ref 0.0–1.2)
CO2: 23 mmol/L (ref 20–29)
Calcium: 9.5 mg/dL (ref 8.7–10.2)
Chloride: 104 mmol/L (ref 96–106)
Creatinine, Ser: 0.81 mg/dL (ref 0.57–1.00)
Globulin, Total: 2.9 g/dL (ref 1.5–4.5)
Glucose: 95 mg/dL (ref 70–99)
Potassium: 4.2 mmol/L (ref 3.5–5.2)
Sodium: 142 mmol/L (ref 134–144)
Total Protein: 7.2 g/dL (ref 6.0–8.5)
eGFR: 99 mL/min/{1.73_m2} (ref >59)

## 2022-10-10 LAB — TSH: TSH: 1.27 u[IU]/mL (ref 0.450–4.500)

## 2022-10-14 ENCOUNTER — Encounter: Payer: Self-pay | Admitting: Family Medicine

## 2022-10-14 ENCOUNTER — Telehealth (INDEPENDENT_AMBULATORY_CARE_PROVIDER_SITE_OTHER): Payer: Medicaid Other | Admitting: Family Medicine

## 2022-10-14 DIAGNOSIS — J0100 Acute maxillary sinusitis, unspecified: Secondary | ICD-10-CM | POA: Diagnosis not present

## 2022-10-14 MED ORDER — SULFAMETHOXAZOLE-TRIMETHOPRIM 800-160 MG PO TABS: 1.0000 | ORAL_TABLET | Freq: Two times a day (BID) | ORAL | 0 refills | Status: DC

## 2022-10-14 MED ORDER — PSEUDOEPHEDRINE-GUAIFENESIN ER 120-1200 MG PO TB12: 1.0000 | ORAL_TABLET | Freq: Two times a day (BID) | ORAL | 0 refills | Status: DC

## 2022-10-14 NOTE — Progress Notes (Signed)
Subjective:    Patient ID: Mckenzie Cooley, female    DOB: Aug 19, 1990, 32 y.o.   MRN: [id number]   HPI: Mckenzie Cooley is a 32 y.o. female presenting for Symptoms include congestion, facial pain, green nasal dicharge, green productive cough, post nasal drip and sinus pressure. There is  fever, chills, no sweats. Onset of symptoms was 2 days ago, gradually worsening since that time.        03/06/2022    9:47 AM 02/20/2022   11:01 AM 02/02/2022    9:28 AM 01/28/2022    9:06 AM 12/31/2021   11:51 AM  Depression screen PHQ 2/9  Decreased Interest 0  0 0 0  Down, Depressed, Hopeless 0  0 0 0  PHQ - 2 Score 0  0 0 0  Altered sleeping 0  3  3  Tired, decreased energy 0  3  3  Change in appetite 0  0  0  Feeling bad or failure about yourself  0  0  0  Trouble concentrating 0  0  0  Moving slowly or fidgety/restless 0  0  0  Suicidal thoughts 0  0  0  PHQ-9 Score 0  6  6  Difficult doing work/chores Not difficult at all  Somewhat difficult  Very difficult     Information is confidential and restricted. Go to Review Flowsheets to unlock data.     Relevant past medical, surgical, family and social history reviewed and updated as indicated.  Interim medical history since our last visit reviewed. Allergies and medications reviewed and updated.  ROS:  Review of Systems  Constitutional:  Negative for activity change, appetite change, chills and fever.  HENT:  Positive for congestion, postnasal drip, rhinorrhea and sinus pressure. Negative for ear discharge, ear pain, hearing loss, nosebleeds, sneezing and trouble swallowing.   Respiratory:  Negative for chest tightness and shortness of breath.   Cardiovascular:  Negative for chest pain and palpitations.  Skin:  Negative for rash.     Social History   Tobacco Use  Smoking Status Former   Packs/day: 0.50   Years: 12.00   Additional pack years: 0.00   Total pack years: 6.00   Types: Cigarettes   Quit date: 05/05/2016   Years since  quitting: 6.4   Passive exposure: Past  Smokeless Tobacco Never       Objective:     Wt Readings from Last 3 Encounters:  10/09/22 216 lb (98 kg)  08/19/22 217 lb (98.4 kg)  08/04/22 221 lb 1.6 oz (100.3 kg)     Exam deferred. Pt. Harboring due to COVID 19. Phone visit performed.   Assessment & Plan:   1. Acute maxillary sinusitis, recurrence not specified     Meds ordered this encounter  Medications   sulfamethoxazole-trimethoprim (BACTRIM DS) 800-160 MG tablet    Sig: Take 1 tablet by mouth 2 (two) times daily. Until gone, for infection    Dispense:  20 tablet    Refill:  0   Pseudoephedrine-Guaifenesin [phone] MG TB12    Sig: Take 1 tablet by mouth 2 (two) times daily. For congestion    Dispense:  14 tablet    Refill:  0    No orders of the defined types were placed in this encounter.     Diagnoses and all orders for this visit:  Acute maxillary sinusitis, recurrence not specified  Other orders -     sulfamethoxazole-trimethoprim (BACTRIM DS) 800-160 MG tablet; Take 1  tablet by mouth 2 (two) times daily. Until gone, for infection -     Pseudoephedrine-Guaifenesin [phone] MG TB12; Take 1 tablet by mouth 2 (two) times daily. For congestion    Virtual Visit via telephone Note  I discussed the limitations, risks, security and privacy concerns of performing an evaluation and management service by telephone and the availability of in person appointments. The patient was identified with two identifiers. Pt.expressed understanding and agreed to proceed. Pt. Is at home. Dr. Darlyn Read is in his office.  Follow Up Instructions:   I discussed the assessment and treatment plan with the patient. The patient was provided an opportunity to ask questions and all were answered. The patient agreed with the plan and demonstrated an understanding of the instructions.   The patient was advised to call back or seek an in-person evaluation if the symptoms worsen or if the condition  fails to improve as anticipated.   Total minutes including chart review and phone contact time: 12   Follow up plan: Return if symptoms worsen or fail to improve.  Mechele Claude, MD [facility]

## 2022-10-19 ENCOUNTER — Telehealth: Payer: Self-pay | Admitting: Family Medicine

## 2022-10-19 NOTE — Telephone Encounter (Signed)
Pt wanted to let Neysa Bonito know that when she took liquid iron supplement and she broke out in hives/rash. She had a reaction and has a rash on shoulders neck chin and face/face is itchy and looks sun burned. Pt cannot take pill form because she cannot swallow. Pt wants to know if she can do injections? Please call back

## 2022-10-19 NOTE — Telephone Encounter (Signed)
Iron is not low enough for infusion I believe, Can she tolerate a multivitamin and try to eat iron rich diet.   Jannifer Rodney, FNP

## 2022-10-20 NOTE — Telephone Encounter (Signed)
Lmtcb.

## 2022-10-20 NOTE — Telephone Encounter (Signed)
Pt says she has trouble swallowing pills so if she can crush an iron pill then she will but she was told by pharmacy not to do that. Also says she is not able to take gummie vitamins because they get stuck in her throat. Please advise and call patient.

## 2022-10-20 NOTE — Telephone Encounter (Signed)
LMTCB

## 2022-11-10 ENCOUNTER — Ambulatory Visit (INDEPENDENT_AMBULATORY_CARE_PROVIDER_SITE_OTHER): Payer: Medicaid Other | Admitting: Gastroenterology

## 2022-11-10 ENCOUNTER — Encounter (INDEPENDENT_AMBULATORY_CARE_PROVIDER_SITE_OTHER): Payer: Self-pay | Admitting: Gastroenterology

## 2022-11-10 VITALS — BP 117/84 | HR 58 | Temp 98.2°F | Ht 60.0 in | Wt 214.3 lb

## 2022-11-10 DIAGNOSIS — R1319 Other dysphagia: Secondary | ICD-10-CM

## 2022-11-10 DIAGNOSIS — K224 Dyskinesia of esophagus: Secondary | ICD-10-CM | POA: Diagnosis not present

## 2022-11-10 DIAGNOSIS — D509 Iron deficiency anemia, unspecified: Secondary | ICD-10-CM | POA: Diagnosis not present

## 2022-11-10 DIAGNOSIS — K581 Irritable bowel syndrome with constipation: Secondary | ICD-10-CM | POA: Diagnosis not present

## 2022-11-10 MED ORDER — LINACLOTIDE 72 MCG PO CAPS: 72.0000 ug | ORAL_CAPSULE | Freq: Every day | ORAL | 3 refills | Status: AC

## 2022-11-10 NOTE — Progress Notes (Unsigned)
Referring Provider: Gabriel Earing, FNP Primary Care Physician:  Gabriel Earing, FNP Primary GI Physician: Levon Hedger   Chief Complaint  Patient presents with   esophageal dysmotility    Follow up on esophageal dysmotility. Puts meds in yogurt to swallowing. Adjusting diet to what she can swallow.   Irritable Bowel Syndrome    Follow up on IBS C. Started taking iron daily but has been doing ok with constipation. Taking linzess daily.    HPI:   Mckenzie Cooley is a 32 y.o. female with past medical history of esophageal dysmotility, anemia, asthma, IBS   Patient presenting today for follow up of esophageal dysmotility and IBS.  Last seen February 2024, at that time, noting more dsyphaiga, sore throat, stopped her nexium as she could not swallow the pill, taking famotidine 40mg  daily, more breakthrough reflux since stopping nexium. Had upcoming follow up at [facility] the next month. Constipation was well manage on linzess . Having a BM every other day. Chronic nausea.   Recommended to open nexium capsules and put in yogurt/applesauce, continue famotidine, continue linzess, EGD, continue to follow at [facility] for dysmotility.  Present:  States symptoms about the same. She crushes or opens pills that are larger or capsules to help her swallow. Weight has been stable for the past few months. She reports chronic nausea but no vomiting. Denies rectal bleeding or melena. Having a BM daily on the linzess, doing well on this.  Iron studies in April with TIBC 321, iron 36, iron saturation 11, ferritin 80, b12 476, folate 13.8, hgb 13.2 she reports that she started having issues with anemia after she had her daughter but had done well until recently. She did liquid iron which she had an allergic reaction to, she is taking flintstone with iron now and feels good. Notes she was having some dizziness and fatigue prior to this which is why PCP checked anemia panel. She notes that she does not eat any  red meat due to her swallowing issues.   Last EGD: - April 2024 No endoscopic/luminal esophageal abnormality to                            explain patient's dysphagia. Esophagus dilated. .                           - Normal stomach.                           - Normal examined duodenum.                           - No specimens collected. Last Colonoscopy:05/21/22- Perianal skin tags found on perianal exam. - The examined portion of the ileum was normal. - The entire examined colon is normal.  - Non-bleeding internal hemorrhoids. - No specimens collected. Endoscopy/endoflip :March 2023 2cm hh he Endoscopic was placed in  the stomach using endoscopic guidance. The balloon was inflated to 30ml  and pulled proximally until the waist was  located along the distal sensors.  At 30ml the balloon pressure was , DI 2 minimum diameter was 8mm,  compliance was  125, CSA was 34,and contractions were seen but difficult  to describe secondary to the 8cm balloon  At 40ml the balloon pressure was , DI 02, minimum diameter was 8mm,  compliance was 45, CSA was 45  and same as above  This is consistent with reduced distensibility with absent contractile  response   Distal and proximal esophageal biopsies were obtained   guidewire was placed in the gastric antrum.Over the guidewire the 18mm  Savary dilators was passed with mild resistance. Post dilation inspection  was satisfactory    Past Medical History:  Diagnosis Date   Acid reflux    Anemia    Bloating    Childhood asthma    Eczema    Esophageal dysmotility    History of blood transfusion    Positive urine pregnancy test 12/14/2018   Preeclampsia    Seasonal allergies     Past Surgical History:  Procedure Laterality Date   BIOPSY  02/26/2021   Procedure: BIOPSY;  Surgeon: Malissa Hippo, MD;  [facility];  Service: Endoscopy;;  Esophageal biopsy   carpal tunnel Left 01/30/2021   CARPAL TUNNEL RELEASE Right  03/27/2021   CESAREAN SECTION N/A 01/02/2017   Procedure: CESAREAN SECTION;  Surgeon: Hermina Staggers, MD;  Location: [facility];  Service: Obstetrics;  Laterality: N/A;   CHOLECYSTECTOMY  2011   Dr.Byerly   COLONOSCOPY WITH PROPOFOL N/A 05/21/2022   Procedure: COLONOSCOPY WITH PROPOFOL;  Surgeon: Dolores Frame, MD;  Location: [facility];  Service: Gastroenterology;  Laterality: N/A;  2:00pm, asa 2   ESOPHAGEAL DILATION N/A 02/26/2021   Procedure: ESOPHAGEAL DILATION;  Surgeon: Malissa Hippo, MD;  Location: [facility];  Service: Endoscopy;  Laterality: N/A;   ESOPHAGEAL DILATION N/A 08/19/2022   Procedure: ESOPHAGEAL DILATION;  Surgeon: Dolores Frame, MD;  [facility]: [facility];  Service: Gastroenterology;  Laterality: N/A;   ESOPHAGOGASTRODUODENOSCOPY N/A 10/26/2014   Procedure: ESOPHAGOGASTRODUODENOSCOPY (EGD);  Surgeon: Malissa Hippo, MD;  [facility]: [facility];  Service: Endoscopy;  Laterality: N/A;   ESOPHAGOGASTRODUODENOSCOPY (EGD) WITH PROPOFOL N/A 02/26/2021   Procedure: ESOPHAGOGASTRODUODENOSCOPY (EGD) WITH PROPOFOL;  Surgeon: Malissa Hippo, MD;  [facility]: [facility];  Service: Endoscopy;  Laterality: N/A;   ESOPHAGOGASTRODUODENOSCOPY (EGD) WITH PROPOFOL N/A 08/19/2022   Procedure: ESOPHAGOGASTRODUODENOSCOPY (EGD) WITH PROPOFOL;  Surgeon: Dolores Frame, MD;  [facility]: [facility];  Service: Gastroenterology;  Laterality: N/A;  200pm, asa 2   POLYPECTOMY  02/26/2021   Procedure: POLYPECTOMY;  Surgeon: Malissa Hippo, MD;  [facility];  Service: Endoscopy;;  Gastric Body Polyps x 3    TYMPANOSTOMY TUBE PLACEMENT     UPPER GASTROINTESTINAL ENDOSCOPY  12/05/2009   UPPER GASTROINTESTINAL ENDOSCOPY  02/07/2009   WISDOM TOOTH EXTRACTION     WISDOM TOOTH EXTRACTION      Current Outpatient Medications  Medication Sig Dispense Refill   acetaminophen (TYLENOL) 500 MG tablet Take 1,000 mg by mouth every 8 (eight)  hours as needed for moderate pain. TAKES IN LIQUID FORM     esomeprazole (NEXIUM) 20 MG capsule TAKE ONE CAPSULE ONCE DAILY BEFORE BREAKFAST 30 capsule 12   famotidine (PEPCID) 40 MG tablet Take 40 mg by mouth daily.     linaclotide (LINZESS) 72 MCG capsule Take 1 capsule (72 mcg total) by mouth daily before breakfast. 90 capsule 1   ondansetron (ZOFRAN) 4 MG tablet Take 1 tablet (4 mg total) by mouth every 8 (eight) hours as needed for nausea or vomiting. 20 tablet 0   triamcinolone ointment (KENALOG) 0.1 % Apply 1 Application topically 2 (two) times daily as needed (rash flare). Do not use on the face, neck,  armpits or groin area. Do not use more than 3 weeks in a row. 30 g 2   No current facility-administered medications for this visit.    Allergies as of 11/10/2022 - Review Complete 11/10/2022  Allergen Reaction Noted   Percocet [oxycodone-acetaminophen] Hives and Itching 07/01/2011   Prilosec [omeprazole] Other (See Comments) 12/30/2020   Amoxicillin Rash 01/17/2014    Family History  Problem Relation Age of Onset   Hypertension Mother    Heart disease Father    Cancer - Cervical Maternal Aunt    Kidney disease Maternal Grandmother    Renal Disease Maternal Grandmother    Eczema Daughter     Social History   Socioeconomic History   Marital status: Married    Spouse name: Not on file   Number of children: 1   Years of education: Not on file   Highest education level: GED or equivalent  Occupational History   Not on file  Tobacco Use   Smoking status: Former    Packs/day: 0.50    Years: 12.00    Additional pack years: 0.00    Total pack years: 6.00    Types: Cigarettes    Quit date: 05/05/2016    Years since quitting: 6.5    Passive exposure: Past   Smokeless tobacco: Never  Vaping Use   Vaping Use: Never used  Substance and Sexual Activity   Alcohol use: No    Alcohol/week: 0.0 standard drinks of alcohol   Drug use: No   Sexual activity: Yes    Birth  control/protection: Condom  Other Topics Concern   Not on file  Social History Narrative   Not on file   Social Determinants of Health   Financial Resource Strain: Low Risk  (10/08/2022)   Overall Financial Resource Strain (CARDIA)    Difficulty of Paying Living Expenses: Not hard at all  Food Insecurity: No Food Insecurity (10/08/2022)   Hunger Vital Sign    Worried About Running Out of Food in the Last Year: Never true    Ran Out of Food in the Last Year: Never true  Transportation Needs: No Transportation Needs (10/08/2022)   PRAPARE - Administrator, Civil Service (Medical): No    Lack of Transportation (Non-Medical): No  Physical Activity: Sufficiently Active (10/08/2022)   Exercise Vital Sign    Days of Exercise per Week: 5 days    Minutes of Exercise per Session: 150+ min  Stress: Stress Concern Present (10/08/2022)   [employer] of Occupational Health - Occupational Stress Questionnaire    Feeling of Stress : Very much  Social Connections: Moderately Isolated (10/08/2022)   Social Connection and Isolation Panel [NHANES]    Frequency of Communication with Friends and Family: More than three times a week    Frequency of Social Gatherings with Friends and Family: Twice a week    Attends Religious Services: Never    Diplomatic Services operational officer: No    Attends Engineer, structural: Not on file    Marital Status: Married    Review of systems General: negative for malaise, night sweats, fever, chills, weight los Neck: Negative for lumps, goiter, pain and significant neck swelling Resp: Negative for cough, wheezing, dyspnea at rest CV: Negative for chest pain, leg swelling, palpitations, orthopnea GI: denies melena, hematochezia, nausea, vomiting, diarrhea, constipation, dysphagia, odyonophagia, early satiety or unintentional weight loss.  MSK: Negative for joint pain or swelling, back pain, and muscle pain. Derm: Negative for itching  or  rash Psych: Denies depression, anxiety, memory loss, confusion. No homicidal or suicidal ideation.  Heme: Negative for prolonged bleeding, bruising easily, and swollen nodes. Endocrine: Negative for cold or heat intolerance, polyuria, polydipsia and goiter. Neuro: negative for tremor, gait imbalance, syncope and seizures. The remainder of the review of systems is noncontributory.  Physical Exam: Ht 5' (1.524 m)   Wt 214 lb 4.8 oz (97.2 kg)   LMP 11/08/2022   BMI 41.85 kg/m  General:   Alert and oriented. No distress noted. Pleasant and cooperative.  Head:  Normocephalic and atraumatic. Eyes:  Conjuctiva clear without scleral icterus. Mouth:  Oral mucosa pink and moist. Good dentition. No lesions. Heart: Normal rate and rhythm, s1 and s2 heart sounds present.  Lungs: Clear lung sounds in all lobes. Respirations equal and unlabored. Abdomen:  +BS, soft, non-tender and non-distended. No rebound or guarding. No HSM or masses noted. Derm: No palmar erythema or jaundice Msk:  Symmetrical without gross deformities. Normal posture. Extremities:  Without edema. Neurologic:  Alert and  oriented x4 Psych:  Alert and cooperative. Normal mood and affect.  Invalid input(s): "6 MONTHS"   ASSESSMENT: Mckenzie Cooley is a 32 y.o. female presenting today    PLAN:  Continue linzess daily 2. Continue nexium 20mg  daily  3. Continue pepcid 40mg  4. Continue with iron    Follow Up: 6 months   Lilianna Case L. Jeanmarie Hubert, MSN, APRN, AGNP-C Adult-Gerontology Nurse Practitioner [facility]

## 2022-11-10 NOTE — Patient Instructions (Addendum)
Continue linzess daily Continue nexium 20mg  daily  Continue pepcid 40mg  Continue with daily flintstone vitamin, diet high in iron rich foods. Would recommend rechecking iron levels again in July   Follow up 6 months

## 2022-11-11 DIAGNOSIS — D509 Iron deficiency anemia, unspecified: Secondary | ICD-10-CM | POA: Insufficient documentation

## 2022-11-24 ENCOUNTER — Ambulatory Visit: Payer: Medicaid Other | Admitting: Nurse Practitioner

## 2022-11-24 ENCOUNTER — Encounter: Payer: Self-pay | Admitting: Nurse Practitioner

## 2022-11-24 VITALS — BP 116/73 | HR 62 | Temp 98.1°F | Ht 61.0 in | Wt 217.0 lb

## 2022-11-24 DIAGNOSIS — M5442 Lumbago with sciatica, left side: Secondary | ICD-10-CM | POA: Diagnosis not present

## 2022-11-24 MED ORDER — KETOROLAC TROMETHAMINE 30 MG/ML IJ SOLN
30.0000 mg | Freq: Once | INTRAMUSCULAR | Status: AC
  Administered 2022-11-24: 30 mg via INTRAMUSCULAR

## 2022-11-24 MED ORDER — TIZANIDINE HCL 2 MG PO CAPS: 2.0000 mg | ORAL_CAPSULE | Freq: Three times a day (TID) | ORAL | 0 refills | Status: DC

## 2022-11-24 NOTE — Progress Notes (Signed)
   Acute Office Visit  Subjective:     Patient ID: Mckenzie Cooley, female    DOB: [birth year], 32 y.o.   MRN: [id number]  Chief Complaint  Patient presents with   Back Pain    Thinks it is a pulled muscle. Started last Thursday. Not sure what caused the pain. Has taken tylenol, ibuprofen, heating pad. Hurts to bend over and pick heavy things up   HPI Mckenzie Cooley is a 32 y.o. female who complains of an injury causing low back pain 1 week(s) ago. The pain is positional with bending or lifting, without radiation down the legs. Mechanism of injury: was out in the yard lifting, does heavy lifting at work. Symptoms have been acute since that time. Prior history of back problems: recurrent self limited episodes of low back pain in the past. There is no numbness in the legs. Describes the pain as sharp pain 6/10 'feel likes a needle is getting in" sitting at an angle helps relieve the pain.  LMP 11/08/2022 and declined HCG (sign waiver in EMR)    ROS Negative unless indicated in HPI    Objective:    BP 116/73   Pulse 62   Temp 98.1 F (36.7 C) (Temporal)   Ht 5\' 1"  (1.549 m)   Wt 217 lb (98.4 kg)   LMP 11/08/2022   SpO2 98%   BMI 41.00 kg/m  BP Readings from Last 3 Encounters:  11/24/22 116/73  11/10/22 117/84  10/09/22 139/85   Wt Readings from Last 3 Encounters:  11/24/22 217 lb (98.4 kg)  11/10/22 214 lb 4.8 oz (97.2 kg)  10/09/22 216 lb (98 kg)      Physical Exam OBJECTIVE: Vital signs as noted above. Patient appears to be in mild to moderate pain, antalgic gait noted. Lumbosacral spine area reveals no local tenderness or mass. Painful and reduced LS ROM noted. Straight leg raise is positive at 35 degrees on left, lower. DTR's, motor strength and sensation normal, including heel and toe gait.  Peripheral pulses are palpable. Lumbar spine X-Ray: not indicated.   No results found for any visits on 11/24/22.      Assessment & Plan:  Acute midline low back pain with  left-sided sciatica -     tiZANidine HCl; Take 1 capsule (2 mg total) by mouth 3 (three) times daily.  Dispense: 30 capsule; Refill: 0 -     Ketorolac Tromethamine  ASSESSMENT:  lumbar strain  PLAN: For acute pain, rest, intermittent application of cold packs (later, may switch to heat, but do not sleep on heating pad), analgesics and muscle relaxants are recommended. Discussed longer term treatment plan of prn NSAID's and discussed a home back care exercise program with flexion exercise routine. Proper lifting with avoidance of heavy lifting discussed. Consider Physical Therapy and XRay studies if not improving. Call or return to clinic prn if these symptoms worsen or fail to improve as anticipated.  Toradol 30 mg IM injection administered at the office Zanaflex 2 mg 1-tab TID PRN Client to avoid ending and twisting Plan of care discussed with client  Return if symptoms worsen or fail to improve.  [address] Santa Lighter DNP

## 2022-12-02 ENCOUNTER — Encounter: Payer: Self-pay | Admitting: Family Medicine

## 2022-12-02 ENCOUNTER — Telehealth: Payer: Medicaid Other | Admitting: Family Medicine

## 2022-12-02 DIAGNOSIS — J039 Acute tonsillitis, unspecified: Secondary | ICD-10-CM | POA: Diagnosis not present

## 2022-12-02 MED ORDER — LEVOFLOXACIN 500 MG PO TABS: 500.0000 mg | ORAL_TABLET | Freq: Every day | ORAL | 0 refills | Status: DC

## 2022-12-02 NOTE — Progress Notes (Signed)
Subjective:    Patient ID: Mckenzie Cooley, female    DOB: 10-Feb-1991, 31 y.o.   MRN: [id number]   HPI: Mckenzie Cooley is a 32 y.o. female presenting for onset last night of tonsils swelling. Throat hurts. Right ear hurts. Having drainage, throat scratchy. No fever. Cough started a few days ago. Nonproductive. Frontal HA with pressure at frontal sinuses      11/24/2022    2:37 PM 03/06/2022    9:47 AM 02/20/2022   11:01 AM 02/02/2022    9:28 AM 01/28/2022    9:06 AM  Depression screen PHQ 2/9  Decreased Interest 0 0  0 0  Down, Depressed, Hopeless 0 0  0 0  PHQ - 2 Score 0 0  0 0  Altered sleeping 0 0  3   Tired, decreased energy 0 0  3   Change in appetite 0 0  0   Feeling bad or failure about yourself  0 0  0   Trouble concentrating 0 0  0   Moving slowly or fidgety/restless 0 0  0   Suicidal thoughts 0 0  0   PHQ-9 Score 0 0  6   Difficult doing work/chores Not difficult at all Not difficult at all  Somewhat difficult      Information is confidential and restricted. Go to Review Flowsheets to unlock data.     Relevant past medical, surgical, family and social history reviewed and updated as indicated.  Interim medical history since our last visit reviewed. Allergies and medications reviewed and updated.  ROS:  Review of Systems  Constitutional:  Negative for fever.  HENT:  Positive for congestion, ear pain, postnasal drip and sore throat. Negative for hearing loss and trouble swallowing.   Respiratory:  Negative for shortness of breath.   Skin:  Negative for rash.  Neurological:  Positive for headaches.     Social History   Tobacco Use  Smoking Status Former   Packs/day: 0.50   Years: 12.00   Additional pack years: 0.00   Total pack years: 6.00   Types: Cigarettes   Quit date: 05/05/2016   Years since quitting: 6.5   Passive exposure: Past  Smokeless Tobacco Never       Objective:     Wt Readings from Last 3 Encounters:  11/24/22 217 lb (98.4 kg)   11/10/22 214 lb 4.8 oz (97.2 kg)  10/09/22 216 lb (98 kg)     Exam deferred. Pt. Harboring due to COVID 19. Phone visit performed.   Assessment & Plan:   1. Tonsillitis     Meds ordered this encounter  Medications   levofloxacin (LEVAQUIN) 500 MG tablet    Sig: Take 1 tablet (500 mg total) by mouth daily.    Dispense:  7 tablet    Refill:  0    No orders of the defined types were placed in this encounter.     Diagnoses and all orders for this visit:  Tonsillitis  Other orders -     levofloxacin (LEVAQUIN) 500 MG tablet; Take 1 tablet (500 mg total) by mouth daily.    Video visit note  I discussed the limitations, risks, security and privacy concerns of performing an evaluation and management service by video and the availability of in person appointments. The patient was identified with two identifiers. Pt.expressed understanding and agreed to proceed. Pt. Is at home. Dr. Darlyn Read is in his office.  Follow Up Instructions:   I discussed the  assessment and treatment plan with the patient. The patient was provided an opportunity to ask questions and all were answered. The patient agreed with the plan and demonstrated an understanding of the instructions.   The patient was advised to call back or seek an in-person evaluation if the symptoms worsen or if the condition fails to improve as anticipated.   Total minutes including chart review and phone contact time: 11   Follow up plan: No follow-ups on file.  Mechele Claude, MD [facility]

## 2022-12-16 ENCOUNTER — Other Ambulatory Visit (INDEPENDENT_AMBULATORY_CARE_PROVIDER_SITE_OTHER): Payer: Self-pay | Admitting: *Deleted

## 2022-12-16 ENCOUNTER — Telehealth (INDEPENDENT_AMBULATORY_CARE_PROVIDER_SITE_OTHER): Payer: Self-pay | Admitting: *Deleted

## 2022-12-16 DIAGNOSIS — D509 Iron deficiency anemia, unspecified: Secondary | ICD-10-CM

## 2022-12-16 NOTE — Telephone Encounter (Signed)
Patient in reminder file per chelsea to repeat iron studies in July. Pt called and wanted lab orders mailed to her to do in July and she wanted to use [facility]. Orders mailed.

## 2022-12-23 ENCOUNTER — Encounter (INDEPENDENT_AMBULATORY_CARE_PROVIDER_SITE_OTHER): Payer: Self-pay

## 2022-12-23 ENCOUNTER — Other Ambulatory Visit: Payer: Medicaid Other

## 2022-12-23 DIAGNOSIS — D509 Iron deficiency anemia, unspecified: Secondary | ICD-10-CM | POA: Diagnosis not present

## 2022-12-24 LAB — IRON,TIBC AND FERRITIN PANEL
Ferritin: 59 ng/mL (ref 15–150)
Iron Saturation: 14 % — ABNORMAL LOW (ref 15–55)
Iron: 45 ug/dL (ref 27–159)
Total Iron Binding Capacity: 324 ug/dL (ref 250–450)
UIBC: 279 ug/dL (ref 131–425)

## 2022-12-29 ENCOUNTER — Encounter (INDEPENDENT_AMBULATORY_CARE_PROVIDER_SITE_OTHER): Payer: Self-pay

## 2022-12-30 ENCOUNTER — Other Ambulatory Visit (INDEPENDENT_AMBULATORY_CARE_PROVIDER_SITE_OTHER): Payer: Self-pay | Admitting: Gastroenterology

## 2022-12-31 ENCOUNTER — Other Ambulatory Visit (INDEPENDENT_AMBULATORY_CARE_PROVIDER_SITE_OTHER): Payer: Self-pay | Admitting: Gastroenterology

## 2023-03-02 ENCOUNTER — Encounter (INDEPENDENT_AMBULATORY_CARE_PROVIDER_SITE_OTHER): Payer: Self-pay | Admitting: Gastroenterology

## 2023-03-26 ENCOUNTER — Other Ambulatory Visit: Payer: Self-pay | Admitting: Family Medicine

## 2023-03-26 ENCOUNTER — Encounter: Payer: Self-pay | Admitting: Family Medicine

## 2023-03-26 ENCOUNTER — Telehealth (INDEPENDENT_AMBULATORY_CARE_PROVIDER_SITE_OTHER): Payer: Medicaid Other | Admitting: Family Medicine

## 2023-03-26 DIAGNOSIS — J0140 Acute pansinusitis, unspecified: Secondary | ICD-10-CM

## 2023-03-26 MED ORDER — CEFDINIR 250 MG/5ML PO SUSR: 300.0000 mg | Freq: Two times a day (BID) | ORAL | 0 refills | Status: AC

## 2023-03-26 NOTE — Progress Notes (Signed)
   Virtual Visit via video Note   Due to COVID-19 pandemic this visit was conducted virtually. This visit type was conducted due to national recommendations for restrictions regarding the COVID-19 Pandemic (e.g. social distancing, sheltering in place) in an effort to limit this patient's exposure and mitigate transmission in our community. All issues noted in this document were discussed and addressed.  A physical exam was not performed with this format.  I connected with  Mckenzie Cooley  on 03/26/23 at 1252 by video and verified that I am speaking with the correct person using two identifiers. Mckenzie Cooley is currently located in her car at work and no one is currently with her during the visit. The provider, Gabriel Earing, FNP is located in their office at time of visit.  I discussed the limitations, risks, security and privacy concerns of performing an evaluation and management service by video  and the availability of in person appointments. I also discussed with the patient that there may be a patient responsible charge related to this service. The patient expressed understanding and agreed to proceed.  CC: sinusitis  History and Present Illness:  Mckenzie Cooley reports nasal and head congestion x 3 weeks, unchanged. She also reports nonproductive cough, facial pain and pressure, postnasal drip, and ear pain. She denies wheezing, shortness of breath, fever, chest pain. She has been taking antihistamines, decongestants, cough suppressants, tylenol, and advil without improvement.    ROS As per HPI.     Observations/Objective: Alert and oriented. Respirations unlabored. No cyanosis. Non toxic appearing. Normal mood and behavior.   Assessment and Plan: Mckenzie Cooley was seen today for sinusitis.  Diagnoses and all orders for this visit:  Acute non-recurrent pansinusitis Omnicef as below as she has tolerated cephlasporins. Liquid sent due to hx of dysphagia. She will let me know if symptoms are  not improving after the weekend. Will send in clindamycin for additional coverage with omnicef if no improvement at that point. Discussed symptomatic care and return precautions.  -     cefdinir (OMNICEF) 250 MG/5ML suspension; Take 6 mLs (300 mg total) by mouth 2 (two) times daily for 7 days.     Follow Up Instructions: As needed.     I discussed the assessment and treatment plan with the patient. The patient was provided an opportunity to ask questions and all were answered. The patient agreed with the plan and demonstrated an understanding of the instructions.   The patient was advised to call back or seek an in-person evaluation if the symptoms worsen or if the condition fails to improve as anticipated.  The above assessment and management plan was discussed with the patient. The patient verbalized understanding of and has agreed to the management plan. Patient is aware to call the clinic if symptoms persist or worsen. Patient is aware when to return to the clinic for a follow-up visit. Patient educated on when it is appropriate to go to the emergency department.   Time call ended: 1300  I provided 8 minutes of face-to-face time during this encounter.    Gabriel Earing, FNP

## 2023-05-17 ENCOUNTER — Ambulatory Visit (INDEPENDENT_AMBULATORY_CARE_PROVIDER_SITE_OTHER): Payer: Medicaid Other | Admitting: Gastroenterology

## 2023-05-25 ENCOUNTER — Ambulatory Visit (INDEPENDENT_AMBULATORY_CARE_PROVIDER_SITE_OTHER): Payer: Medicaid Other | Admitting: Gastroenterology

## 2023-08-10 ENCOUNTER — Other Ambulatory Visit: Payer: Self-pay | Admitting: Family Medicine

## 2023-08-26 ENCOUNTER — Ambulatory Visit (INDEPENDENT_AMBULATORY_CARE_PROVIDER_SITE_OTHER)

## 2023-08-26 ENCOUNTER — Ambulatory Visit: Admitting: Family Medicine

## 2023-08-26 ENCOUNTER — Encounter: Payer: Self-pay | Admitting: Family Medicine

## 2023-08-26 ENCOUNTER — Telehealth: Payer: Self-pay

## 2023-08-26 ENCOUNTER — Telehealth: Payer: Self-pay | Admitting: Family Medicine

## 2023-08-26 VITALS — BP 138/89 | HR 62 | Temp 98.2°F | Ht 61.0 in | Wt 218.4 lb

## 2023-08-26 DIAGNOSIS — Z0001 Encounter for general adult medical examination with abnormal findings: Secondary | ICD-10-CM | POA: Diagnosis not present

## 2023-08-26 DIAGNOSIS — I878 Other specified disorders of veins: Secondary | ICD-10-CM | POA: Diagnosis not present

## 2023-08-26 DIAGNOSIS — M25561 Pain in right knee: Secondary | ICD-10-CM

## 2023-08-26 DIAGNOSIS — N910 Primary amenorrhea: Secondary | ICD-10-CM | POA: Diagnosis not present

## 2023-08-26 DIAGNOSIS — M5117 Intervertebral disc disorders with radiculopathy, lumbosacral region: Secondary | ICD-10-CM | POA: Diagnosis not present

## 2023-08-26 DIAGNOSIS — M25551 Pain in right hip: Secondary | ICD-10-CM

## 2023-08-26 DIAGNOSIS — M79671 Pain in right foot: Secondary | ICD-10-CM

## 2023-08-26 DIAGNOSIS — E041 Nontoxic single thyroid nodule: Secondary | ICD-10-CM

## 2023-08-26 DIAGNOSIS — M545 Low back pain, unspecified: Secondary | ICD-10-CM | POA: Diagnosis not present

## 2023-08-26 DIAGNOSIS — M4727 Other spondylosis with radiculopathy, lumbosacral region: Secondary | ICD-10-CM | POA: Diagnosis not present

## 2023-08-26 DIAGNOSIS — M79604 Pain in right leg: Secondary | ICD-10-CM | POA: Diagnosis not present

## 2023-08-26 DIAGNOSIS — Z6841 Body Mass Index (BMI) 40.0 and over, adult: Secondary | ICD-10-CM

## 2023-08-26 DIAGNOSIS — M4807 Spinal stenosis, lumbosacral region: Secondary | ICD-10-CM | POA: Diagnosis not present

## 2023-08-26 DIAGNOSIS — Z0000 Encounter for general adult medical examination without abnormal findings: Secondary | ICD-10-CM

## 2023-08-26 DIAGNOSIS — M4726 Other spondylosis with radiculopathy, lumbar region: Secondary | ICD-10-CM | POA: Diagnosis not present

## 2023-08-26 LAB — PREGNANCY, URINE: Preg Test, Ur: NEGATIVE

## 2023-08-26 MED ORDER — MELOXICAM 10 MG PO CAPS: 10.0000 mg | ORAL_CAPSULE | Freq: Every day | ORAL | 0 refills | Status: DC | PRN

## 2023-08-26 NOTE — Progress Notes (Signed)
 Complete physical exam  Patient: Mckenzie Cooley   DOB: 1991/03/22   33 y.o. Female  MRN: [id number]  Subjective:    Chief Complaint  Patient presents with   Leg Pain   Back Pain   Annual Exam    Mckenzie Cooley is a 33 y.o. female who presents today for a complete physical exam. She reports consuming a general diet. The patient does not participate in regular exercise at present. She generally feels fairly well. She reports sleeping poorly. She does have additional problems to discuss today.   Back Pain This is a new problem. Episode onset: 3 months. The problem occurs intermittently. The pain is present in the gluteal. The quality of the pain is described as aching. The pain radiates to the right foot, right thigh and right knee. Exacerbated by: moving from sitting to standing. Associated symptoms include leg pain and tingling. Pertinent negatives include no bladder incontinence, bowel incontinence, numbness, paresis, paresthesias or perianal numbness. She has tried NSAIDs, home exercises and bed rest for the symptoms. The treatment provided no relief.   Pain radiates from right hip, down lateral thigh, to right knee, right heel, and right medial foot.   Needs repeat thyroid US. She has noticed decreased appetite. She has lost weight because she isn't eating as much.    Thyroid US from 03/20/22 "IMPRESSION: 1. Solitary 1 cm TI-RADS category 4 nodule in the inferior aspect of the thyroid isthmus meets criteria for imaging surveillance. Recommend follow-up ultrasound in 1 year."  Most recent fall risk assessment:    08/26/2023    3:38 PM  Fall Risk   Falls in the past year? 0     Most recent depression screenings:    08/26/2023    3:38 PM 11/24/2022    2:37 PM  PHQ 2/9 Scores  PHQ - 2 Score 0 0  PHQ- 9 Score 5 0        Patient Care Team: Mckenzie Earing, FNP as PCP - General (Family Medicine)   Outpatient Medications Prior to Visit  Medication Sig   acetaminophen  (TYLENOL) 500 MG tablet Take 1,000 mg by mouth every 8 (eight) hours as needed for moderate pain. TAKES IN LIQUID FORM   esomeprazole (NEXIUM) 20 MG capsule Take 1 capsule (20 mg total) by mouth daily before breakfast. **NEEDS TO BE SEEN BEFORE NEXT REFILL**   famotidine (PEPCID) 40 MG tablet Take 40 mg by mouth daily.   linaclotide (LINZESS) 72 MCG capsule Take 1 capsule (72 mcg total) by mouth daily before breakfast.   [DISCONTINUED] levofloxacin (LEVAQUIN) 500 MG tablet Take 1 tablet (500 mg total) by mouth daily.   [DISCONTINUED] ondansetron (ZOFRAN) 4 MG tablet Take 1 tablet (4 mg total) by mouth every 8 (eight) hours as needed for nausea or vomiting.   [DISCONTINUED] tizanidine (ZANAFLEX) 2 MG capsule Take 1 capsule (2 mg total) by mouth 3 (three) times daily.   [DISCONTINUED] triamcinolone ointment (KENALOG) 0.1 % Apply 1 Application topically 2 (two) times daily as needed (rash flare). Do not use on the face, neck, armpits or groin area. Do not use more than 3 weeks in a row.   No facility-administered medications prior to visit.    ROS Negative unless specially indicated above in HPI.     Objective:     BP 138/89   Pulse 62   Temp 98.2 F (36.8 C) (Temporal)   Ht 5\' 1"  (1.549 m)   Wt 218 lb 6 oz (99.1  kg)   SpO2 100%   BMI 41.26 kg/m    Physical Exam Vitals and nursing note reviewed.  Constitutional:      General: She is not in acute distress.    Appearance: Normal appearance. She is obese. She is not ill-appearing or toxic-appearing.  HENT:     Head: Normocephalic.     Right Ear: Tympanic membrane, ear canal and external ear normal.     Left Ear: Tympanic membrane, ear canal and external ear normal.     Nose: Nose normal.     Mouth/Throat:     Mouth: Mucous membranes are moist.     Pharynx: Oropharynx is clear.  Eyes:     Extraocular Movements: Extraocular movements intact.     Conjunctiva/sclera: Conjunctivae normal.     Pupils: Pupils are equal, round, and  reactive to light.  Neck:     Thyroid: No thyroid mass, thyromegaly or thyroid tenderness.  Cardiovascular:     Rate and Rhythm: Normal rate and regular rhythm.     Pulses: Normal pulses.     Heart sounds: Normal heart sounds. No murmur heard.    No friction rub. No gallop.  Pulmonary:     Effort: Pulmonary effort is normal.     Breath sounds: Normal breath sounds.  Abdominal:     General: Bowel sounds are normal. There is no distension.     Palpations: Abdomen is soft. There is no mass.     Tenderness: There is no abdominal tenderness. There is no guarding.  Musculoskeletal:        General: No swelling. Normal range of motion.     Cervical back: Normal range of motion and neck supple. No tenderness.     Lumbar back: Tenderness and bony tenderness present. Negative right straight leg raise test and negative left straight leg raise test.     Right hip: Tenderness present. No bony tenderness.     Right knee: No swelling, deformity, effusion or bony tenderness. Tenderness present over the patellar tendon. Normal alignment.     Instability Tests: Medial McMurray test negative and lateral McMurray test negative.     Right lower leg: No edema.     Left lower leg: No edema.     Right foot: Normal range of motion and normal capillary refill. Tenderness (heel- plantar aspect) present. No swelling, deformity, bony tenderness or crepitus. Normal pulse.  Skin:    General: Skin is warm and dry.     Capillary Refill: Capillary refill takes less than 2 seconds.     Findings: No lesion or rash.  Neurological:     General: No focal deficit present.     Mental Status: She is alert and oriented to person, place, and time.     Cranial Nerves: No cranial nerve deficit.     Sensory: No sensory deficit.     Motor: No weakness.     Coordination: Coordination normal.     Gait: Gait abnormal (antalgic gait).  Psychiatric:        Mood and Affect: Mood normal.        Behavior: Behavior normal.         Thought Content: Thought content normal.        Judgment: Judgment normal.      No results found for any visits on 08/26/23.     Assessment & Plan:    Routine Health Maintenance and Physical Exam  Mckenzie Cooley was seen today for leg pain, back pain and annual exam.  Diagnoses and all orders for this visit:  Routine general medical examination at a health care facility  Thyroid nodule TSH pending. Will repeat thyroid US.  -     TSH + free T4 -     US THYROID; Future  Morbid obesity (HCC) Labs pending. Diet, exercise as tolerated.  -     CBC with Differential/Platelet -     CMP14+EGFR -     Lipid panel  Low back pain radiating to right lower extremity ? Lumbar radiculopathy. Try meloxicam as below prn. Do not take any other NSAIDs with this. Xrays today in office of lumbar, hip, and knee- reports pending. Discussed PT if appropriate pending report.  -     DG Lumbar Spine 2-3 Views; Future -     Meloxicam 10 MG CAPS; Take 10 mg by mouth daily as needed.  Right hip pain -     DG Hip Unilat W OR W/O Pelvis 2-3 Views Right; Future -     Meloxicam 10 MG CAPS; Take 10 mg by mouth daily as needed.  Acute pain of right knee -     DG Knee 1-2 Views Right; Future -     Meloxicam 10 MG CAPS; Take 10 mg by mouth daily as needed.  Right foot pain -     Meloxicam 10 MG CAPS; Take 10 mg by mouth daily as needed.  Delayed menses -     Pregnancy, urine   Immunization History  Administered Date(s) Administered   DTaP 10/04/1990, 01/04/1991, 04/04/1991, 08/26/1992, 10/16/1995   HIB (PRP-OMP) 10/04/1990, 01/04/1991, 04/04/1991, 08/26/1992   Hepatitis A 09/08/2007   Hepatitis B 04/04/1991, 09/30/1992, 04/09/1993, 03/02/2002   Hpv-Unspecified 09/08/2007   IPV 10/04/1990, 01/04/1991, 08/26/1992, 10/16/1995   Influenza-Unspecified 03/14/2017   MMR 08/26/1992, 10/16/1995   Meningococcal Conjugate 09/08/2007   Tdap 10/25/2004, 11/19/2015    Health Maintenance  Topic Date Due   HPV  VACCINES (2 - 3-dose series) 10/06/2007   Hepatitis C Screening  Never done   COVID-19 Vaccine (1) 09/11/2023 (Originally 07/05/1995)   INFLUENZA VACCINE  09/20/2023 (Originally 01/21/2023)   DTaP/Tdap/Td (8 - Td or Tdap) 11/18/2025   Cervical Cancer Screening (HPV/Pap Cotest)  02/06/2026   HIV Screening  Completed   Pneumococcal Vaccine 20-17 Years old  Discontinued    Discussed health benefits of physical activity, and encouraged her to engage in regular exercise appropriate for her age and condition.  Problem List Items Addressed This Visit       Other   Routine general medical examination at a health care facility - Primary   Other Visit Diagnoses       Thyroid nodule       Relevant Orders   TSH + free T4     Morbid obesity (HCC)       Relevant Orders   CBC with Differential/Platelet   CMP14+EGFR   Lipid panel     Low back pain radiating to right lower extremity       Relevant Medications   Meloxicam 10 MG CAPS   Other Relevant Orders   DG Lumbar Spine 2-3 Views     Right hip pain       Relevant Medications   Meloxicam 10 MG CAPS   Other Relevant Orders   DG Hip Unilat W OR W/O Pelvis 2-3 Views Right     Acute pain of right knee       Relevant Medications   Meloxicam 10 MG CAPS   Other Relevant Orders  DG Knee 1-2 Views Right     Right foot pain       Relevant Medications   Meloxicam 10 MG CAPS     Delayed menses       Relevant Orders   Pregnancy, urine      Return in 1 year (on 08/25/2024).   The patient indicates understanding of these issues and agrees with the plan.  Mckenzie Earing, FNP

## 2023-08-26 NOTE — Patient Instructions (Signed)

## 2023-08-26 NOTE — Progress Notes (Deleted)
 Complete physical exam  Patient: Mckenzie Cooley   DOB: [birth year]   33 y.o. Female  MRN: [id number]  Subjective:    Chief Complaint  Patient presents with   Leg Pain   Back Pain    Mckenzie Cooley is a 33 y.o. female who presents today for a complete physical exam. She reports consuming a general diet. The patient does not participate in regular exercise at present. She generally feels fairly well. She reports sleeping poorly. She does have additional problems to discuss today.   Back Pain This is a new problem. Episode onset: 3 months. The problem occurs intermittently. The pain is present in the gluteal. The quality of the pain is described as aching. The pain radiates to the right foot, right thigh and right knee. Exacerbated by: moving from sitting to standing. Associated symptoms include leg pain and tingling. Pertinent negatives include no bladder incontinence, bowel incontinence, numbness, paresis, paresthesias or perianal numbness. She has tried NSAIDs, home exercises and bed rest for the symptoms. The treatment provided no relief.   Needs repeat thyroid US. She has noticed decreased appetite. She has lost weight because she isn't eating as much.    Thyroid US from 03/20/22 "IMPRESSION: 1. Solitary 1 cm TI-RADS category 4 nodule in the inferior aspect of the thyroid isthmus meets criteria for imaging surveillance. Recommend follow-up ultrasound in 1 year."  Most recent fall risk assessment:    08/26/2023    3:38 PM  Fall Risk   Falls in the past year? 0     Most recent depression screenings:    08/26/2023    3:38 PM 11/24/2022    2:37 PM  PHQ 2/9 Scores  PHQ - 2 Score 0 0  PHQ- 9 Score 5 0    {History (Optional):23778}  Patient Care Team: Gabriel Earing, FNP as PCP - General (Family Medicine)   Outpatient Medications Prior to Visit  Medication Sig   acetaminophen (TYLENOL) 500 MG tablet Take 1,000 mg by mouth every 8 (eight) hours as needed for moderate pain.  TAKES IN LIQUID FORM   esomeprazole (NEXIUM) 20 MG capsule Take 1 capsule (20 mg total) by mouth daily before breakfast. **NEEDS TO BE SEEN BEFORE NEXT REFILL**   famotidine (PEPCID) 40 MG tablet Take 40 mg by mouth daily.   linaclotide (LINZESS) 72 MCG capsule Take 1 capsule (72 mcg total) by mouth daily before breakfast.   [DISCONTINUED] levofloxacin (LEVAQUIN) 500 MG tablet Take 1 tablet (500 mg total) by mouth daily.   [DISCONTINUED] ondansetron (ZOFRAN) 4 MG tablet Take 1 tablet (4 mg total) by mouth every 8 (eight) hours as needed for nausea or vomiting.   [DISCONTINUED] tizanidine (ZANAFLEX) 2 MG capsule Take 1 capsule (2 mg total) by mouth 3 (three) times daily.   [DISCONTINUED] triamcinolone ointment (KENALOG) 0.1 % Apply 1 Application topically 2 (two) times daily as needed (rash flare). Do not use on the face, neck, armpits or groin area. Do not use more than 3 weeks in a row.   No facility-administered medications prior to visit.    ROS        Objective:     BP 138/89   Pulse 62   Temp 98.2 F (36.8 C) (Temporal)   Ht 5\' 1"  (1.549 m)   Wt 218 lb 6 oz (99.1 kg)   SpO2 100%   BMI 41.26 kg/m  {Vitals History (Optional):23777}  Physical Exam   No results found for any visits on 08/26/23. {Show  previous labs (optional):23779}    Assessment & Plan:    Routine Health Maintenance and Physical Exam  Immunization History  Administered Date(s) Administered   DTaP 10/04/1990, 01/04/1991, 04/04/1991, 08/26/1992, 10/16/1995   HIB (PRP-OMP) 10/04/1990, 01/04/1991, 04/04/1991, 08/26/1992   Hepatitis A 09/08/2007   Hepatitis B 04/04/1991, 09/30/1992, 04/09/1993, 03/02/2002   Hpv-Unspecified 09/08/2007   IPV 10/04/1990, 01/04/1991, 08/26/1992, 10/16/1995   Influenza-Unspecified 03/14/2017   MMR 08/26/1992, 10/16/1995   Meningococcal Conjugate 09/08/2007   Tdap 10/25/2004, 11/19/2015    Health Maintenance  Topic Date Due   HPV VACCINES (2 - 3-dose series) 10/06/2007    Hepatitis C Screening  Never done   COVID-19 Vaccine (1) 09/11/2023 (Originally 07/05/1995)   INFLUENZA VACCINE  09/20/2023 (Originally 01/21/2023)   DTaP/Tdap/Td (8 - Td or Tdap) 11/18/2025   Cervical Cancer Screening (HPV/Pap Cotest)  02/06/2026   HIV Screening  Completed   Pneumococcal Vaccine 60-59 Years old  Discontinued    Discussed health benefits of physical activity, and encouraged her to engage in regular exercise appropriate for her age and condition.  Problem List Items Addressed This Visit   None Visit Diagnoses       Thyroid nodule    -  Primary   Relevant Orders   TSH + free T4     Morbid obesity (HCC)       Relevant Orders   CBC with Differential/Platelet   CMP14+EGFR   Lipid panel     Low back pain radiating to right lower extremity       Relevant Medications   Meloxicam 10 MG CAPS   Other Relevant Orders   DG Lumbar Spine 2-3 Views     Right hip pain       Relevant Medications   Meloxicam 10 MG CAPS   Other Relevant Orders   DG Hip Unilat W OR W/O Pelvis 2-3 Views Right     Acute pain of right knee       Relevant Medications   Meloxicam 10 MG CAPS   Other Relevant Orders   DG Knee 1-2 Views Right     Right foot pain       Relevant Medications   Meloxicam 10 MG CAPS     Delayed menses       Relevant Orders   Pregnancy, urine      No follow-ups on file.     Gabriel Earing, FNP

## 2023-08-26 NOTE — Telephone Encounter (Signed)
 Copied from CRM [id number]. Topic: General - Other >> Aug 26, 2023  5:08 PM Emylou G wrote: Reason for CRM: adv patient of notes to continue to take OTC ibuphrophen based on unable to get meloxicam or celebrex .Marland Kitchen She had returned the office call

## 2023-08-26 NOTE — Telephone Encounter (Signed)
 I spoke to [facility] and they state only a Meloxicam 5mg  capsule is available but it is very expensive and not covered by patients insurance. Celebrex is capsule form, after speaking with Harlow Mares she advised pt just continue with otc ibuprofen. I tried to call the pt and lmtcb.

## 2023-08-26 NOTE — Addendum Note (Signed)
 Addended by: Hessie Diener on: 08/26/2023 04:49 PM   Modules accepted: Orders

## 2023-08-26 NOTE — Telephone Encounter (Signed)
 Copied from CRM [id number]. Topic: Clinical - Prescription Issue >> Aug 26, 2023  4:11 PM Marland Kitchen D wrote: Sharia Reeve called in from [facility] stating the prescription request that was put in Meloxicam 10 MG CAPS does not exist it only comes in 7.5MG  or 15 MG.  [facility] [place], [place] - [address] [place] [address] Phone: [phone] Fax: [phone] Hours: Not open 24 hours

## 2023-08-27 ENCOUNTER — Encounter: Payer: Self-pay | Admitting: Family Medicine

## 2023-08-27 ENCOUNTER — Other Ambulatory Visit: Payer: Self-pay | Admitting: Family Medicine

## 2023-08-27 DIAGNOSIS — M545 Low back pain, unspecified: Secondary | ICD-10-CM

## 2023-08-27 DIAGNOSIS — Z1159 Encounter for screening for other viral diseases: Secondary | ICD-10-CM

## 2023-08-27 DIAGNOSIS — M25551 Pain in right hip: Secondary | ICD-10-CM

## 2023-08-27 LAB — CBC WITH DIFFERENTIAL/PLATELET
Basophils Absolute: 0.1 10*3/uL (ref 0.0–0.2)
Basos: 1 %
EOS (ABSOLUTE): 0.2 10*3/uL (ref 0.0–0.4)
Eos: 2 %
Hematocrit: 38.7 % (ref 34.0–46.6)
Hemoglobin: 13.1 g/dL (ref 11.1–15.9)
Immature Grans (Abs): 0 10*3/uL (ref 0.0–0.1)
Immature Granulocytes: 0 %
Lymphocytes Absolute: 2.6 10*3/uL (ref 0.7–3.1)
Lymphs: 27 %
MCH: 28.1 pg (ref 26.6–33.0)
MCHC: 33.9 g/dL (ref 31.5–35.7)
MCV: 83 fL (ref 79–97)
Monocytes Absolute: 0.8 10*3/uL (ref 0.1–0.9)
Monocytes: 9 %
Neutrophils Absolute: 5.9 10*3/uL (ref 1.4–7.0)
Neutrophils: 61 %
Platelets: 352 10*3/uL (ref 150–450)
RBC: 4.66 x10E6/uL (ref 3.77–5.28)
RDW: 12.7 % (ref 11.7–15.4)
WBC: 9.5 10*3/uL (ref 3.4–10.8)

## 2023-08-27 LAB — CMP14+EGFR
ALT: 27 IU/L (ref 0–32)
AST: 22 IU/L (ref 0–40)
Albumin: 4.3 g/dL (ref 3.9–4.9)
Alkaline Phosphatase: 73 IU/L (ref 44–121)
BUN/Creatinine Ratio: 9 (ref 9–23)
BUN: 7 mg/dL (ref 6–20)
Bilirubin Total: 0.5 mg/dL (ref 0.0–1.2)
CO2: 25 mmol/L (ref 20–29)
Calcium: 9.1 mg/dL (ref 8.7–10.2)
Chloride: 104 mmol/L (ref 96–106)
Creatinine, Ser: 0.74 mg/dL (ref 0.57–1.00)
Globulin, Total: 2.7 g/dL (ref 1.5–4.5)
Glucose: 85 mg/dL (ref 70–99)
Potassium: 4.2 mmol/L (ref 3.5–5.2)
Sodium: 141 mmol/L (ref 134–144)
Total Protein: 7 g/dL (ref 6.0–8.5)
eGFR: 109 mL/min/{1.73_m2} (ref >59)

## 2023-08-27 LAB — LIPID PANEL
Chol/HDL Ratio: 3 ratio (ref 0.0–4.4)
Cholesterol, Total: 152 mg/dL (ref 100–199)
HDL: 50 mg/dL (ref >39)
LDL Chol Calc (NIH): 89 mg/dL (ref 0–99)
Triglycerides: 63 mg/dL (ref 0–149)
VLDL Cholesterol Cal: 13 mg/dL (ref 5–40)

## 2023-08-27 LAB — TSH+FREE T4
Free T4: 1.07 ng/dL (ref 0.82–1.77)
TSH: 1.1 u[IU]/mL (ref 0.450–4.500)

## 2023-08-27 NOTE — Addendum Note (Signed)
 Addended by: Hessie Diener on: 08/27/2023 09:18 AM   Modules accepted: Orders

## 2023-08-27 NOTE — Telephone Encounter (Signed)
 Pt notified via E2C2 nurse. Documented in a separate encounter. LS

## 2023-08-31 ENCOUNTER — Ambulatory Visit

## 2023-09-01 ENCOUNTER — Ambulatory Visit (INDEPENDENT_AMBULATORY_CARE_PROVIDER_SITE_OTHER)

## 2023-09-01 ENCOUNTER — Other Ambulatory Visit: Payer: Self-pay

## 2023-09-01 DIAGNOSIS — Z1159 Encounter for screening for other viral diseases: Secondary | ICD-10-CM | POA: Diagnosis not present

## 2023-09-01 DIAGNOSIS — Z23 Encounter for immunization: Secondary | ICD-10-CM

## 2023-09-01 NOTE — Progress Notes (Signed)
 Patient is in office today for a nurse visit for Gardasil Immunization. Patient Injection was given in the  Right deltoid. Patient tolerated injection well.

## 2023-09-02 ENCOUNTER — Encounter: Payer: Self-pay | Admitting: Family Medicine

## 2023-09-02 LAB — HEPATITIS C ANTIBODY: Hep C Virus Ab: NONREACTIVE

## 2023-09-09 ENCOUNTER — Ambulatory Visit (HOSPITAL_COMMUNITY)
Admission: RE | Admit: 2023-09-09 | Discharge: 2023-09-09 | Disposition: A | Discharge status: Home / Self Care | Source: Ambulatory Visit | Attending: Family Medicine | Admitting: Family Medicine

## 2023-09-09 DIAGNOSIS — E041 Nontoxic single thyroid nodule: Secondary | ICD-10-CM | POA: Insufficient documentation

## 2023-09-15 ENCOUNTER — Other Ambulatory Visit: Payer: Self-pay | Admitting: Family Medicine

## 2023-09-15 ENCOUNTER — Ambulatory Visit: Attending: Family Medicine

## 2023-09-15 DIAGNOSIS — M545 Low back pain, unspecified: Secondary | ICD-10-CM | POA: Insufficient documentation

## 2023-09-15 DIAGNOSIS — E041 Nontoxic single thyroid nodule: Secondary | ICD-10-CM

## 2023-09-15 DIAGNOSIS — M25551 Pain in right hip: Secondary | ICD-10-CM | POA: Diagnosis not present

## 2023-09-15 DIAGNOSIS — M6281 Muscle weakness (generalized): Secondary | ICD-10-CM | POA: Insufficient documentation

## 2023-09-15 DIAGNOSIS — M5416 Radiculopathy, lumbar region: Secondary | ICD-10-CM | POA: Diagnosis not present

## 2023-09-15 DIAGNOSIS — M79604 Pain in right leg: Secondary | ICD-10-CM | POA: Diagnosis not present

## 2023-09-15 NOTE — Therapy (Signed)
 OUTPATIENT PHYSICAL THERAPY THORACOLUMBAR EVALUATION   Patient Name: Mckenzie Cooley MRN: [id number] DOB:August 18, 1990, 33 y.o., female Today's Date: 09/15/2023  END OF SESSION:  PT End of Session - 09/15/23 0925     Visit Number 1    Number of Visits 12    Date for PT Re-Evaluation 11/19/23    PT Start Time 0855    PT Stop Time 0950    PT Time Calculation (min) 55 min    Activity Tolerance Patient tolerated treatment well    Behavior During Therapy [facility] for tasks assessed/performed             Past Medical History:  Diagnosis Date   Acid reflux    Anemia    Bloating    Childhood asthma    Eczema    Esophageal dysmotility    History of blood transfusion    Positive urine pregnancy test 12/14/2018   Preeclampsia    Seasonal allergies    Past Surgical History:  Procedure Laterality Date   BIOPSY  02/26/2021   Procedure: BIOPSY;  Surgeon: Malissa Hippo, MD;  [facility];  Service: Endoscopy;;  Esophageal biopsy   carpal tunnel Left 01/30/2021   CARPAL TUNNEL RELEASE Right 03/27/2021   CESAREAN SECTION N/A 01/02/2017   Procedure: CESAREAN SECTION;  Surgeon: Hermina Staggers, MD;  Location: [facility];  Service: Obstetrics;  Laterality: N/A;   CHOLECYSTECTOMY  2011   Dr.Byerly   COLONOSCOPY WITH PROPOFOL N/A 05/21/2022   Procedure: COLONOSCOPY WITH PROPOFOL;  Surgeon: Dolores Frame, MD;  Location: [facility];  Service: Gastroenterology;  Laterality: N/A;  2:00pm, asa 2   ESOPHAGEAL DILATION N/A 02/26/2021   Procedure: ESOPHAGEAL DILATION;  Surgeon: Malissa Hippo, MD;  Location: [facility];  Service: Endoscopy;  Laterality: N/A;   ESOPHAGEAL DILATION N/A 08/19/2022   Procedure: ESOPHAGEAL DILATION;  Surgeon: Dolores Frame, MD;  Location: [facility];  Service: Gastroenterology;  Laterality: N/A;   ESOPHAGOGASTRODUODENOSCOPY N/A 10/26/2014   Procedure: ESOPHAGOGASTRODUODENOSCOPY (EGD);  Surgeon: Malissa Hippo, MD;   [facility]: [facility];  Service: Endoscopy;  Laterality: N/A;   ESOPHAGOGASTRODUODENOSCOPY (EGD) WITH PROPOFOL N/A 02/26/2021   Procedure: ESOPHAGOGASTRODUODENOSCOPY (EGD) WITH PROPOFOL;  Surgeon: Malissa Hippo, MD;  [facility]: [facility];  Service: Endoscopy;  Laterality: N/A;   ESOPHAGOGASTRODUODENOSCOPY (EGD) WITH PROPOFOL N/A 08/19/2022   Procedure: ESOPHAGOGASTRODUODENOSCOPY (EGD) WITH PROPOFOL;  Surgeon: Dolores Frame, MD;  [facility]: [facility];  Service: Gastroenterology;  Laterality: N/A;  200pm, asa 2   POLYPECTOMY  02/26/2021   Procedure: POLYPECTOMY;  Surgeon: Malissa Hippo, MD;  [facility];  Service: Endoscopy;;  Gastric Body Polyps x 3    TYMPANOSTOMY TUBE PLACEMENT     UPPER GASTROINTESTINAL ENDOSCOPY  12/05/2009   UPPER GASTROINTESTINAL ENDOSCOPY  02/07/2009   WISDOM TOOTH EXTRACTION     WISDOM TOOTH EXTRACTION     Patient Active Problem List   Diagnosis Date Noted   Acute midline low back pain with left-sided sciatica 11/24/2022   Iron deficiency anemia 11/11/2022   Esophageal dysphagia 08/06/2022   Rash and nonspecific skin eruption 07/21/2022   Other atopic dermatitis 07/21/2022   Gastrointestinal complaints 07/21/2022   Pruritus 07/21/2022   Other adverse food reactions, not elsewhere classified, subsequent encounter 07/21/2022   IBS (irritable bowel syndrome) 05/04/2022   Rectal bleeding 05/04/2022   Adjustment disorder 02/20/2022   Dyssomnia 02/20/2022   Attention deficit hyperactivity disorder (ADHD), predominantly inattentive type 02/20/2022  History of bipolar disorder 02/20/2022   GAD (generalized anxiety disorder) 08/26/2021   Current moderate 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 episode (HCC) 08/26/2021   Esophageal dysmotility    RUQ abdominal pain    Vaginal irritation 04/22/2021   Vaginal discharge 04/01/2021   Routine general medical examination at a health care facility 02/06/2021   Patient desires  pregnancy 02/06/2021   Bilateral carpal tunnel syndrome 11/06/2020   Spotting 12/14/2018   Carpal tunnel syndrome, left upper limb 06/01/2018   Migraine 12/09/2017   Iron deficiency 07/20/2017   Allergic otitis media of both ears 03/24/2017   History of severe pre-eclampsia 02/23/2017   History of anemia 02/23/2017   Previous cesarean section 02/09/2017   Postoperative anemia due to acute blood loss 01/06/2017   Constipation 10/27/2014   Gastroesophageal reflux disease 07/01/2011   REFERRING PROVIDER: Gabriel Earing, FNP   REFERRING DIAG: Low back pain radiating to right lower extremity, Right hip pain   Rationale for Evaluation and Treatment: Rehabilitation  THERAPY DIAG:  Radiculopathy, lumbar region  Muscle weakness (generalized)  ONSET DATE: 4 months ago  SUBJECTIVE:                                                                                                                                                                                           SUBJECTIVE STATEMENT: Patient reports that she has been having back and right leg pain about 4 months ago. She has noticed that it is slowly getting worse and it is now going up toward her upper trapezius. She is primarily standing and moving when she is at work and home due to work and helping care for family. She has always had some low back pain, but the hip was what really started to bother her. Her back pain has also gotten worse since the hip pain began.   PERTINENT HISTORY:  History of migraines, anxiety, depression, ADHD, bipolar disorder, and allergies  PAIN:  Are you having pain? Yes: NPRS scale: Current: 1-2/10 Best: 1-2/10 Worst:8-9/10  Pain location: right low back and leg Pain description: constant, throbbing, aching, and occasional cramping Aggravating factors: standing, walking,getting out of bed, and moving Relieving factors: sitting, and changing positions  PRECAUTIONS: None  RED FLAGS: None   WEIGHT  BEARING RESTRICTIONS: No  FALLS:  Has patient fallen in last 6 months? 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  DIAGNOSTIC FINDINGS: 08/26/23 lumbar, R hip, and R knee IMPRESSION: 1. Mild-to-moderate L5-S1 degenerative disc and endplate changes. 2. 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  IMPRESSION: Minimal right acetabular degenerative spurring. No significant osteoarthritis.  IMPRESSION: Normal right knee radiographs. PATIENT SURVEYS:  Modified Oswestry 22% disability   COGNITION: Overall cognitive status: Within functional limits for tasks assessed     SENSATION: Patient reports intermittent tingling in her right low back at least once per day for a few minutes, but none currently  PALPATION: TTP: bilateral lumbar paraspinals (R side reproduced familiar pain), bilateral piriformis (R reproduced sharp hip pain)    LUMBAR JOINT MOBILITY:  L1-4: WFL and nonpainful  L4-5: hypomobile and painful  LUMBAR ROM:   AROM eval  Flexion 8  Extension 10  Right lateral flexion 25% limited; painful  Left lateral flexion WFL; strain  Right rotation WFL   Left rotation 25% limited   (Blank rows = not tested)  LOWER EXTREMITY ROM: WFL for activities assessed  LOWER EXTREMITY MMT:    MMT Right eval Left eval  Hip flexion 4/5; right thigh and knee pain 5/5  Hip extension    Hip abduction    Hip adduction     Hip internal rotation    Hip external rotation    Knee flexion 4+/5; sore 5/5  Knee extension 4/5 5/5  Ankle dorsiflexion    Ankle plantarflexion    Ankle inversion    Ankle eversion     (Blank rows = not tested)  GAIT: Assistive device utilized: None Level of assistance: Complete Independence Comments: decreased gait speed and stride length  TREATMENT DATE:                                                                                                                                  PATIENT EDUCATION:  Education details: plan of care, prognosis, healing, anatomy, referred pain, and goals for physical therapy Person educated: Patient Education method: Explanation Education comprehension: verbalized understanding  HOME EXERCISE PROGRAM:   ASSESSMENT:  CLINICAL IMPRESSION: Patient is a 33 y.o. female who was seen today for physical therapy evaluation and treatment for chronic right lumbar radiculopathy. She presented with low to moderate pain severity and irritability with palpation to her right lumbar paraspinals and L4-5 joint mobility testing reproducing her familiar symptoms. She also exhibited reduced lumbar active range of motion and right lower extremity muscular strength with pain being her primary limiting factor. Recommend that she continue with skilled physical therapy to address her impairments to return to her prior level of function.  OBJECTIVE IMPAIRMENTS: Abnormal gait, decreased activity tolerance,  decreased mobility, difficulty walking, decreased ROM, decreased strength, hypomobility, impaired tone, and pain.   ACTIVITY LIMITATIONS: lifting, bending, standing, squatting, stairs, transfers, locomotion level, and caring for others  PARTICIPATION LIMITATIONS: cleaning, shopping, community activity, and occupation  PERSONAL FACTORS: Past/current experiences, Time since onset of injury/illness/exacerbation, and 3+ comorbidities: History of migraines, anxiety,  depression, ADHD, bipolar disorder, and allergies  are also affecting patient's functional outcome.   REHAB POTENTIAL: Good  CLINICAL DECISION MAKING: Evolving/moderate complexity  EVALUATION COMPLEXITY: Moderate   GOALS: Goals reviewed with patient? Yes  SHORT TERM GOALS: Target date: 10/06/23  Patient will be independent with her initial HEP. Baseline: Goal status: INITIAL  2.  Patient will be able to complete her daily activities without her familiar pain exceeding 6/10. Baseline:  Goal status: INITIAL  3.  Patient will improve her active lumbar flexion to at least 20 degrees for improved function picking up items from the floor. Baseline:  Goal status: INITIAL  LONG TERM GOALS: Target date: 10/27/23  Patient will be independent with her advanced HEP. Baseline:  Goal status: INITIAL  2.  Patient will improve her ODI score to 12% disability or less for improved perceived function with her daily activities. Baseline:  Goal status: INITIAL  3.  Patient will be able to navigate at least 4 steps with a reciprocal pattern for improved household mobility. Baseline:  Goal status: INITIAL  4.  Patient will be able to demonstrate proper lifting mechanics to reduce her risk of reinjury. Baseline:  Goal status: INITIAL  5.  Patient will be able to complete her daily activities without her familiar symptoms exceeding 4/10. Baseline:  Goal status: INITIAL  PLAN:  PT FREQUENCY: 2x/week  PT DURATION: 6 weeks  PLANNED INTERVENTIONS: 97164- PT Re-evaluation, 97110-Therapeutic exercises, 97530- Therapeutic activity, 97112- Neuromuscular re-education, 97535- Self Care, 16109- Manual therapy, Patient/Family education, Balance training, Stair training, Taping, Dry Needling, Joint mobilization, Joint manipulation, Spinal manipulation, Spinal mobilization, Cryotherapy, and Moist heat.  PLAN FOR NEXT SESSION: Nustep or recumbent bike, manual therapy, lumbar and lower extremity  strengthening   Granville Lewis, PT 09/15/2023, 5:53 PM

## 2023-09-20 ENCOUNTER — Ambulatory Visit (INDEPENDENT_AMBULATORY_CARE_PROVIDER_SITE_OTHER): Payer: Medicaid Other | Admitting: Gastroenterology

## 2023-09-20 ENCOUNTER — Encounter

## 2023-09-20 ENCOUNTER — Encounter (INDEPENDENT_AMBULATORY_CARE_PROVIDER_SITE_OTHER): Payer: Self-pay | Admitting: Gastroenterology

## 2023-09-20 VITALS — BP 130/71 | HR 62 | Temp 98.0°F | Ht 60.0 in | Wt 220.8 lb

## 2023-09-20 DIAGNOSIS — K219 Gastro-esophageal reflux disease without esophagitis: Secondary | ICD-10-CM

## 2023-09-20 DIAGNOSIS — K581 Irritable bowel syndrome with constipation: Secondary | ICD-10-CM | POA: Diagnosis not present

## 2023-09-20 DIAGNOSIS — R1310 Dysphagia, unspecified: Secondary | ICD-10-CM

## 2023-09-20 DIAGNOSIS — K224 Dyskinesia of esophagus: Secondary | ICD-10-CM

## 2023-09-20 MED ORDER — ESOMEPRAZOLE MAGNESIUM 40 MG PO CPDR: 40.0000 mg | DELAYED_RELEASE_CAPSULE | Freq: Every day | ORAL | 11 refills | Status: AC

## 2023-09-20 NOTE — Progress Notes (Signed)
 Mckenzie Cooley, M.D. Gastroenterology & Hepatology [facility] Gastroenterology [address] [place], [place] [address]  Primary Care Physician: Gabriel Earing, FNP [address] [place] [address]  I will communicate my assessment and recommendations to the referring MD via EMR.  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  The patient was last seen on 11/10/2022. At that time, the patient was continued on Linzess 72 mcg daily, Nexium 20 mg daily, Pepcid 40 mg at night and she was advised to continue [employer] multivitamins.  Patient had iron stores checked on 12/23/2022 that showed borderline iron saturation of 14% but better compared to prior, iron was 45 and ferritin 59.  Has a BM with the use of Linzess 72 mcg qday.  No issues with straining to have bowel movements otherwise.  Patient states that a month ago she started noticing dysphagia was coming back. She was able to eat chicken before but now she is taking only soft food or liquid diet. She stays nauseated all day long  - does not take any, but she takes some peppermint candy as it helps some. However, her GERD seems to be worse - takes Nexium 20 mg qday and famotidine 40 mg bedtime. The patient denies having any vomiting, fever, chills, hematochezia, melena, hematemesis, abdominal distention, abdominal pain, diarrhea, jaundice, pruritus or weight loss.  Currently undergoing evaluation of a thyroid nodule, which is 1.5 cm in size - TR4. She will have this biopsied soon.  Last EGD: - April 2024 No endoscopic/luminal esophageal abnormality to                            explain patient's dysphagia. Esophagus dilated. .                           - Normal  stomach.                           - Normal examined duodenum.                           - No specimens collected. Last Colonoscopy:05/21/22- Perianal skin tags found on perianal exam. - The examined portion of the ileum was normal. - The entire examined colon is normal.  - Non-bleeding internal hemorrhoids. - No specimens collected. Endoscopy/endoflip :March 2023 2cm hh he Endoscopic was placed in  the stomach using endoscopic guidance. The balloon was inflated to 30ml  and pulled proximally until the waist was  located along the distal sensors.  At 30ml the balloon pressure was , DI 2 minimum diameter was 8mm,  compliance was  125, CSA was 34,and contractions were seen but difficult  to describe secondary to the 8cm balloon  At 40ml the balloon pressure was , DI 02, minimum diameter was 8mm,  compliance was 45, CSA was 45  and same as above  This is consistent with reduced distensibility with absent contractile  response   Distal and proximal esophageal biopsies were obtained   guidewire was placed in the gastric antrum.Over the guidewire the 18mm  Savary dilators was passed with mild resistance.  Post dilation inspection  was satisfactory   Most recent manometry - 05/02/21 *Notes. Motility values are mean among swallows;: Simultaneous contractions: Velocity > 8.0 cm/s; eSlv: eSleeve; 3SN, IRP, DCI, IBP - See manual definitions Supine liquid swallow interpretation : The patient has >50% with a DCI<450 but less than 70%. There is a normal tensive normal relaxing LES. Inconclusive diagnosis of IEM Multiple rapid swallows : Normal deglutitive inhibition and adequate contractile reserve Upright liquid swallow analysis : 4 swallows with a DCI<450 and IRP<66mmHg Rapid drink challenge : Normal deglutitive inhibition and IRP <59mmHg, abnormal impedance bolus clearance. Impedance analysis : abnormal bolus clearance for liquids. Normal esophageal impedance integral ratio Overall  impression :.The diagnosis of ineffective esophageal motility is possible based on the manometry but not conclusive. The lack of contractile reserve on multiple rapid swallows and DCI< than 450 in the majority of the upright swallows is concerning and supports a diagnosis of IEM.   Past Medical History: Past Medical History:  Diagnosis Date   Acid reflux    Anemia    Bloating    Childhood asthma    Eczema    Esophageal dysmotility    History of blood transfusion    Positive urine pregnancy test 12/14/2018   Preeclampsia    Seasonal allergies     Past Surgical History: Past Surgical History:  Procedure Laterality Date   BIOPSY  02/26/2021   Procedure: BIOPSY;  Surgeon: Malissa Hippo, MD;  [facility];  Service: Endoscopy;;  Esophageal biopsy   carpal tunnel Left 01/30/2021   CARPAL TUNNEL RELEASE Right 03/27/2021   CESAREAN SECTION N/A 01/02/2017   Procedure: CESAREAN SECTION;  Surgeon: Hermina Staggers, MD;  Location: [facility];  Service: Obstetrics;  Laterality: N/A;   CHOLECYSTECTOMY  2011   Dr.Byerly   COLONOSCOPY WITH PROPOFOL N/A 05/21/2022   Procedure: COLONOSCOPY WITH PROPOFOL;  Surgeon: Dolores Frame, MD;  [facility];  Service: Gastroenterology;  Laterality: N/A;  2:00pm, asa 2   ESOPHAGEAL DILATION N/A 02/26/2021   Procedure: ESOPHAGEAL DILATION;  Surgeon: Malissa Hippo, MD;  [facility];  Service: Endoscopy;  Laterality: N/A;   ESOPHAGEAL DILATION N/A 08/19/2022   Procedure: ESOPHAGEAL DILATION;  Surgeon: Dolores Frame, MD;  [facility];  Service: Gastroenterology;  Laterality: N/A;   ESOPHAGOGASTRODUODENOSCOPY N/A 10/26/2014   Procedure: ESOPHAGOGASTRODUODENOSCOPY (EGD);  Surgeon: Malissa Hippo, MD;  [facility];  Service: Endoscopy;  Laterality: N/A;   ESOPHAGOGASTRODUODENOSCOPY (EGD) WITH PROPOFOL N/A 02/26/2021   Procedure: ESOPHAGOGASTRODUODENOSCOPY (EGD) WITH  PROPOFOL;  Surgeon: Malissa Hippo, MD;  [facility];  Service: Endoscopy;  Laterality: N/A;   ESOPHAGOGASTRODUODENOSCOPY (EGD) WITH PROPOFOL N/A 08/19/2022   Procedure: ESOPHAGOGASTRODUODENOSCOPY (EGD) WITH PROPOFOL;  Surgeon: Dolores Frame, MD;  [facility];  Service: Gastroenterology;  Laterality: N/A;  200pm, asa 2   POLYPECTOMY  02/26/2021   Procedure: POLYPECTOMY;  Surgeon: Malissa Hippo, MD;  [facility];  Service: Endoscopy;;  Gastric Body Polyps x 3    TYMPANOSTOMY TUBE PLACEMENT     UPPER GASTROINTESTINAL ENDOSCOPY  12/05/2009   UPPER GASTROINTESTINAL ENDOSCOPY  02/07/2009   WISDOM TOOTH EXTRACTION     WISDOM TOOTH EXTRACTION      Family History: Family History  Problem Relation Age of Onset   Hypertension Mother    Heart disease Father    Cancer - Cervical Maternal Aunt    Kidney disease Maternal Grandmother  Renal Disease Maternal Grandmother    Eczema Daughter     Social History: Social History   Tobacco Use  Smoking Status Former   Current packs/day: 0.00   Average packs/day: 0.5 packs/day for 12.0 years (6.0 ttl pk-yrs)   Types: Cigarettes   Start date: 05/05/2004   Quit date: 05/05/2016   Years since quitting: 7.3   Passive exposure: Past  Smokeless Tobacco Never   Social History   Substance and Sexual Activity  Alcohol Use No   Alcohol/week: 0.0 standard drinks of alcohol   Social History   Substance and Sexual Activity  Drug Use No    Allergies: Allergies  Allergen Reactions   Ferrous Sulfate Hives    Per patient she broke out in hives with Ferrous Sulfate solution/ liquid form.    Percocet [Oxycodone-Acetaminophen] Hives and Itching   Prilosec [Omeprazole] Other (See Comments)    Stomach cramp   Amoxicillin Rash    Has patient had a PCN reaction causing immediate rash, facial/tongue/throat swelling, SOB or lightheadedness with hypotension: Yes -mild rash Has patient had a PCN reaction  causing severe rash involving mucus membranes or skin necrosis: No Has patient had a PCN reaction that required hospitalization: No Has patient had a PCN reaction occurring within the last 10 years: Yes If all of the above answers are "NO", then may proceed with Cephalosporin use. Has tolerated ceftriaxone & cephalexin     Medications: Current Outpatient Medications  Medication Sig Dispense Refill   acetaminophen (TYLENOL) 500 MG tablet Take 1,000 mg by mouth every 8 (eight) hours as needed for moderate pain. TAKES IN LIQUID FORM     esomeprazole (NEXIUM) 20 MG capsule Take 1 capsule (20 mg total) by mouth daily before breakfast. **NEEDS TO BE SEEN BEFORE NEXT REFILL** 30 capsule 0   famotidine (PEPCID) 40 MG tablet Take 40 mg by mouth daily.     linaclotide (LINZESS) 72 MCG capsule Take 1 capsule (72 mcg total) by mouth daily before breakfast. 90 capsule 3   No current facility-administered medications for this visit.    Review of Systems: GENERAL: negative for malaise, night sweats HEENT: No changes in hearing or vision, no nose bleeds or other nasal problems. NECK: Negative for lumps, goiter, pain and significant neck swelling RESPIRATORY: Negative for cough, wheezing CARDIOVASCULAR: Negative for chest pain, leg swelling, palpitations, orthopnea GI: SEE HPI MUSCULOSKELETAL: Negative for joint pain or swelling, back pain, and muscle pain. SKIN: Negative for lesions, rash PSYCH: Negative for sleep disturbance, mood disorder and recent psychosocial stressors. HEMATOLOGY Negative for prolonged bleeding, bruising easily, and swollen nodes. ENDOCRINE: Negative for cold or heat intolerance, polyuria, polydipsia and goiter. NEURO: negative for tremor, gait imbalance, syncope and seizures. The remainder of the review of systems is noncontributory.   Physical Exam: BP 130/71 (BP Location: Right Arm, Patient Position: Sitting, Cuff Size: Large)   Pulse 62   Temp 98 F (36.7 C)  (Temporal)   Ht 5' (1.524 m)   Wt 220 lb 12.8 oz (100.2 kg)   BMI 43.12 kg/m  GENERAL: The patient is AO x3, in no acute distress. HEENT: Head is normocephalic and atraumatic. EOMI are intact. Mouth is well hydrated and without lesions. NECK: Supple. No masses LUNGS: Clear to auscultation. No presence of rhonchi/wheezing/rales. Adequate chest expansion HEART: RRR, normal s1 and s2. ABDOMEN: Soft, nontender, no guarding, no peritoneal signs, and nondistended. BS +. No masses. EXTREMITIES: Without any cyanosis, clubbing, rash, lesions or edema. NEUROLOGIC: AOx3, no focal motor deficit.  SKIN: no jaundice, no rashes  Imaging/Labs: as above  I personally reviewed and interpreted the available labs, imaging and endoscopic files.  Impression and Plan: VANNESSA GODOWN is a 33 y.o. 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  Patient has presented recurrent issues with dysphagia, which are similar to her previous episodes of dysphagia.  She has had an very extensive evaluation in the past including esophageal manometry, esophagram, Endoflip, esophagogastroduodenospy with findings described above.  She had transient improvement with empiric dilation in the past.  Given recurrent symptoms, we will proceed with this again.  She is also presenting recurrent heartburn episodes, for which she will benefit from increasing her morning PPI dose and continue Pepcid at night at the same dosage.  It is not clear why she has presented some nausea episodes, which have improved some with peppermint.  This may worsen her GERD, I advised her to take ginger instead.  Her IBS-C seems to be well-controlled with use of Linzess 72 mcg every day, which she will continue for now.  -Increase Nexium to 40 mg every day -Continue Pepcid 40 mg every night -Schedule EGD -Can take ginger as needed  for nausea -Continue Linzess 72 mcg every day  All questions were answered.      Mckenzie Blazing, MD Gastroenterology and Hepatology [facility] Gastroenterology

## 2023-09-20 NOTE — H&P (View-Only) (Signed)
 Mckenzie Cooley, M.D. Gastroenterology & Hepatology [facility] Gastroenterology [address] [place], [place] [address]  Primary Care Physician: Gabriel Earing, FNP [address] [place] [address]  I will communicate my assessment and recommendations to the referring MD via EMR.  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  The patient was last seen on 11/10/2022. At that time, the patient was continued on Linzess 72 mcg daily, Nexium 20 mg daily, Pepcid 40 mg at night and she was advised to continue [employer] multivitamins.  Patient had iron stores checked on 12/23/2022 that showed borderline iron saturation of 14% but better compared to prior, iron was 45 and ferritin 59.  Has a BM with the use of Linzess 72 mcg qday.  No issues with straining to have bowel movements otherwise.  Patient states that a month ago she started noticing dysphagia was coming back. She was able to eat chicken before but now she is taking only soft food or liquid diet. She stays nauseated all day long  - does not take any, but she takes some peppermint candy as it helps some. However, her GERD seems to be worse - takes Nexium 20 mg qday and famotidine 40 mg bedtime. The patient denies having any vomiting, fever, chills, hematochezia, melena, hematemesis, abdominal distention, abdominal pain, diarrhea, jaundice, pruritus or weight loss.  Currently undergoing evaluation of a thyroid nodule, which is 1.5 cm in size - TR4. She will have this biopsied soon.  Last EGD: - April 2024 No endoscopic/luminal esophageal abnormality to                            explain patient's dysphagia. Esophagus dilated. .                           - Normal  stomach.                           - Normal examined duodenum.                           - No specimens collected. Last Colonoscopy:05/21/22- Perianal skin tags found on perianal exam. - The examined portion of the ileum was normal. - The entire examined colon is normal.  - Non-bleeding internal hemorrhoids. - No specimens collected. Endoscopy/endoflip :March 2023 2cm hh he Endoscopic was placed in  the stomach using endoscopic guidance. The balloon was inflated to 30ml  and pulled proximally until the waist was  located along the distal sensors.  At 30ml the balloon pressure was , DI 2 minimum diameter was 8mm,  compliance was  125, CSA was 34,and contractions were seen but difficult  to describe secondary to the 8cm balloon  At 40ml the balloon pressure was , DI 02, minimum diameter was 8mm,  compliance was 45, CSA was 45  and same as above  This is consistent with reduced distensibility with absent contractile  response   Distal and proximal esophageal biopsies were obtained   guidewire was placed in the gastric antrum.Over the guidewire the 18mm  Savary dilators was passed with mild resistance.  Post dilation inspection  was satisfactory   Most recent manometry - 05/02/21 *Notes. Motility values are mean among swallows;: Simultaneous contractions: Velocity > 8.0 cm/s; eSlv: eSleeve; 3SN, IRP, DCI, IBP - See manual definitions Supine liquid swallow interpretation : The patient has >50% with a DCI<450 but less than 70%. There is a normal tensive normal relaxing LES. Inconclusive diagnosis of IEM Multiple rapid swallows : Normal deglutitive inhibition and adequate contractile reserve Upright liquid swallow analysis : 4 swallows with a DCI<450 and IRP<66mmHg Rapid drink challenge : Normal deglutitive inhibition and IRP <59mmHg, abnormal impedance bolus clearance. Impedance analysis : abnormal bolus clearance for liquids. Normal esophageal impedance integral ratio Overall  impression :.The diagnosis of ineffective esophageal motility is possible based on the manometry but not conclusive. The lack of contractile reserve on multiple rapid swallows and DCI< than 450 in the majority of the upright swallows is concerning and supports a diagnosis of IEM.   Past Medical History: Past Medical History:  Diagnosis Date   Acid reflux    Anemia    Bloating    Childhood asthma    Eczema    Esophageal dysmotility    History of blood transfusion    Positive urine pregnancy test 12/14/2018   Preeclampsia    Seasonal allergies     Past Surgical History: Past Surgical History:  Procedure Laterality Date   BIOPSY  02/26/2021   Procedure: BIOPSY;  Surgeon: Malissa Hippo, MD;  [facility];  Service: Endoscopy;;  Esophageal biopsy   carpal tunnel Left 01/30/2021   CARPAL TUNNEL RELEASE Right 03/27/2021   CESAREAN SECTION N/A 01/02/2017   Procedure: CESAREAN SECTION;  Surgeon: Hermina Staggers, MD;  Location: [facility] SU[facility];  Service: Obstetrics;  Laterality: N/A;   CHOLECYSTECTOMY  2011   Dr.Byerly   COLONOSCOPY WITH PROPOFOL N/A 05/21/2022   Procedure: COLONOSCOPY WITH PROPOFOL;  Surgeon: Dolores Frame, MD;  [facility];  Service: Gastroenterology;  Laterality: N/A;  2:00pm, asa 2   ESOPHAGEAL DILATION N/A 02/26/2021   Procedure: ESOPHAGEAL DILATION;  Surgeon: Malissa Hippo, MD;  [facility];  Service: Endoscopy;  Laterality: N/A;   ESOPHAGEAL DILATION N/A 08/19/2022   Procedure: ESOPHAGEAL DILATION;  Surgeon: Dolores Frame, MD;  [facility];  Service: Gastroenterology;  Laterality: N/A;   ESOPHAGOGASTRODUODENOSCOPY N/A 10/26/2014   Procedure: ESOPHAGOGASTRODUODENOSCOPY (EGD);  Surgeon: Malissa Hippo, MD;  [facility];  Service: Endoscopy;  Laterality: N/A;   ESOPHAGOGASTRODUODENOSCOPY (EGD) WITH PROPOFOL N/A 02/26/2021   Procedure: ESOPHAGOGASTRODUODENOSCOPY (EGD) WITH  PROPOFOL;  Surgeon: Malissa Hippo, MD;  [facility];  Service: Endoscopy;  Laterality: N/A;   ESOPHAGOGASTRODUODENOSCOPY (EGD) WITH PROPOFOL N/A 08/19/2022   Procedure: ESOPHAGOGASTRODUODENOSCOPY (EGD) WITH PROPOFOL;  Surgeon: Dolores Frame, MD;  [facility];  Service: Gastroenterology;  Laterality: N/A;  200pm, asa 2   POLYPECTOMY  02/26/2021   Procedure: POLYPECTOMY;  Surgeon: Malissa Hippo, MD;  [facility];  Service: Endoscopy;;  Gastric Body Polyps x 3    TYMPANOSTOMY TUBE PLACEMENT     UPPER GASTROINTESTINAL ENDOSCOPY  12/05/2009   UPPER GASTROINTESTINAL ENDOSCOPY  02/07/2009   WISDOM TOOTH EXTRACTION     WISDOM TOOTH EXTRACTION      Family History: Family History  Problem Relation Age of Onset   Hypertension Mother    Heart disease Father    Cancer - Cervical Maternal Aunt    Kidney disease Maternal Grandmother  Renal Disease Maternal Grandmother    Eczema Daughter     Social History: Social History   Tobacco Use  Smoking Status Former   Current packs/day: 0.00   Average packs/day: 0.5 packs/day for 12.0 years (6.0 ttl pk-yrs)   Types: Cigarettes   Start date: 05/05/2004   Quit date: 05/05/2016   Years since quitting: 7.3   Passive exposure: Past  Smokeless Tobacco Never   Social History   Substance and Sexual Activity  Alcohol Use No   Alcohol/week: 0.0 standard drinks of alcohol   Social History   Substance and Sexual Activity  Drug Use No    Allergies: Allergies  Allergen Reactions   Ferrous Sulfate Hives    Per patient she broke out in hives with Ferrous Sulfate solution/ liquid form.    Percocet [Oxycodone-Acetaminophen] Hives and Itching   Prilosec [Omeprazole] Other (See Comments)    Stomach cramp   Amoxicillin Rash    Has patient had a PCN reaction causing immediate rash, facial/tongue/throat swelling, SOB or lightheadedness with hypotension: Yes -mild rash Has patient had a PCN reaction  causing severe rash involving mucus membranes or skin necrosis: No Has patient had a PCN reaction that required hospitalization: No Has patient had a PCN reaction occurring within the last 10 years: Yes If all of the above answers are "NO", then may proceed with Cephalosporin use. Has tolerated ceftriaxone & cephalexin     Medications: Current Outpatient Medications  Medication Sig Dispense Refill   acetaminophen (TYLENOL) 500 MG tablet Take 1,000 mg by mouth every 8 (eight) hours as needed for moderate pain. TAKES IN LIQUID FORM     esomeprazole (NEXIUM) 20 MG capsule Take 1 capsule (20 mg total) by mouth daily before breakfast. **NEEDS TO BE SEEN BEFORE NEXT REFILL** 30 capsule 0   famotidine (PEPCID) 40 MG tablet Take 40 mg by mouth daily.     linaclotide (LINZESS) 72 MCG capsule Take 1 capsule (72 mcg total) by mouth daily before breakfast. 90 capsule 3   No current facility-administered medications for this visit.    Review of Systems: GENERAL: negative for malaise, night sweats HEENT: No changes in hearing or vision, no nose bleeds or other nasal problems. NECK: Negative for lumps, goiter, pain and significant neck swelling RESPIRATORY: Negative for cough, wheezing CARDIOVASCULAR: Negative for chest pain, leg swelling, palpitations, orthopnea GI: SEE HPI MUSCULOSKELETAL: Negative for joint pain or swelling, back pain, and muscle pain. SKIN: Negative for lesions, rash PSYCH: Negative for sleep disturbance, mood disorder and recent psychosocial stressors. HEMATOLOGY Negative for prolonged bleeding, bruising easily, and swollen nodes. ENDOCRINE: Negative for cold or heat intolerance, polyuria, polydipsia and goiter. NEURO: negative for tremor, gait imbalance, syncope and seizures. The remainder of the review of systems is noncontributory.   Physical Exam: BP 130/71 (BP Location: Right Arm, Patient Position: Sitting, Cuff Size: Large)   Pulse 62   Temp 98 F (36.7 C)  (Temporal)   Ht 5' (1.524 m)   Wt 220 lb 12.8 oz (100.2 kg)   BMI 43.12 kg/m  GENERAL: The patient is AO x3, in no acute distress. HEENT: Head is normocephalic and atraumatic. EOMI are intact. Mouth is well hydrated and without lesions. NECK: Supple. No masses LUNGS: Clear to auscultation. No presence of rhonchi/wheezing/rales. Adequate chest expansion HEART: RRR, normal s1 and s2. ABDOMEN: Soft, nontender, no guarding, no peritoneal signs, and nondistended. BS +. No masses. EXTREMITIES: Without any cyanosis, clubbing, rash, lesions or edema. NEUROLOGIC: AOx3, no focal motor deficit.  SKIN: no jaundice, no rashes  Imaging/Labs: as above  I personally reviewed and interpreted the available labs, imaging and endoscopic files.  Impression and Plan: VANNESSA GODOWN is a 33 y.o. 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  Patient has presented recurrent issues with dysphagia, which are similar to her previous episodes of dysphagia.  She has had an very extensive evaluation in the past including esophageal manometry, esophagram, Endoflip, esophagogastroduodenospy with findings described above.  She had transient improvement with empiric dilation in the past.  Given recurrent symptoms, we will proceed with this again.  She is also presenting recurrent heartburn episodes, for which she will benefit from increasing her morning PPI dose and continue Pepcid at night at the same dosage.  It is not clear why she has presented some nausea episodes, which have improved some with peppermint.  This may worsen her GERD, I advised her to take ginger instead.  Her IBS-C seems to be well-controlled with use of Linzess 72 mcg every day, which she will continue for now.  -Increase Nexium to 40 mg every day -Continue Pepcid 40 mg every night -Schedule EGD -Can take ginger as needed  for nausea -Continue Linzess 72 mcg every day  All questions were answered.      Mckenzie Blazing, MD Gastroenterology and Hepatology [facility] Gastroenterology

## 2023-09-20 NOTE — Patient Instructions (Addendum)
 Increase Nexium to 40 mg every day Continue Pepcid 40 mg every night Schedule EGD Can take ginger as needed for nausea

## 2023-09-21 ENCOUNTER — Telehealth (INDEPENDENT_AMBULATORY_CARE_PROVIDER_SITE_OTHER): Payer: Self-pay | Admitting: Gastroenterology

## 2023-09-21 ENCOUNTER — Encounter: Payer: Self-pay | Admitting: Family Medicine

## 2023-09-21 DIAGNOSIS — M79671 Pain in right foot: Secondary | ICD-10-CM

## 2023-09-21 NOTE — Telephone Encounter (Signed)
 PA pending via [facility] for EGD

## 2023-09-22 ENCOUNTER — Ambulatory Visit (HOSPITAL_COMMUNITY)
Admission: RE | Admit: 2023-09-22 | Discharge: 2023-09-22 | Disposition: A | Discharge status: Home / Self Care | Source: Ambulatory Visit | Attending: Family Medicine | Admitting: Family Medicine

## 2023-09-22 ENCOUNTER — Encounter (HOSPITAL_COMMUNITY): Payer: Self-pay

## 2023-09-22 DIAGNOSIS — E041 Nontoxic single thyroid nodule: Secondary | ICD-10-CM | POA: Diagnosis not present

## 2023-09-22 MED ORDER — LIDOCAINE HCL (PF) 2 % IJ SOLN
10.0000 mL | Freq: Once | INTRAMUSCULAR | Status: AC
  Administered 2023-09-22: 10 mL

## 2023-09-22 MED ORDER — LIDOCAINE HCL (PF) 2 % IJ SOLN
INTRAMUSCULAR | Status: AC
  Filled 2023-09-22: qty 10

## 2023-09-22 NOTE — Procedures (Signed)
 PROCEDURE SUMMARY:  Using direct ultrasound guidance, 5 passes were made using 25 g needles into the nodule within the left isthmus of the thyroid.   Ultrasound was used to confirm needle placements on all occasions.   EBL = trace  Specimens were sent to Pathology for analysis.  See procedure note under Imaging tab in Epic for full procedure details.  Loman Brooklyn PA-C 09/22/2023 10:53 AM

## 2023-09-22 NOTE — Progress Notes (Signed)
 PT tolerated thyroid biopsy procedure well today. Labs and afirma obtained and sent for pathology by Colette from ultrasound. PT ambulatory at discharge with no acute distress noted and verbalized understanding of discharge instructions.

## 2023-09-23 ENCOUNTER — Ambulatory Visit: Attending: Family Medicine

## 2023-09-23 DIAGNOSIS — M5416 Radiculopathy, lumbar region: Secondary | ICD-10-CM | POA: Diagnosis not present

## 2023-09-23 DIAGNOSIS — M6281 Muscle weakness (generalized): Secondary | ICD-10-CM | POA: Diagnosis not present

## 2023-09-23 NOTE — Therapy (Signed)
 OUTPATIENT PHYSICAL THERAPY THORACOLUMBAR EVALUATION   Patient Name: Mckenzie Cooley MRN: [id number] DOB:October 15, 1990, 33 y.o., female Today's Date: 09/23/2023  END OF SESSION:  PT End of Session - 09/23/23 0805     Visit Number 2    Number of Visits 12    Date for PT Re-Evaluation 11/19/23    Authorization - Number of Visits 6     PT Start Time 0800    PT Stop Time 0847    PT Time Calculation (min) 47 min    Activity Tolerance Patient tolerated treatment well    Behavior During Therapy [employer] for tasks assessed/performed             Past Medical History:  Diagnosis Date   Acid reflux    Anemia    Bloating    Childhood asthma    Eczema    Esophageal dysmotility    History of blood transfusion    Positive urine pregnancy test 12/14/2018   Preeclampsia    Seasonal allergies    Past Surgical History:  Procedure Laterality Date   BIOPSY  02/26/2021   Procedure: BIOPSY;  Surgeon: Malissa Hippo, MD;  Location: [facility];  Service: Endoscopy;;  Esophageal biopsy   carpal tunnel Left 01/30/2021   CARPAL TUNNEL RELEASE Right 03/27/2021   CESAREAN SECTION N/A 01/02/2017   Procedure: CESAREAN SECTION;  Surgeon: Hermina Staggers, MD;  Location: [facility];  Service: Obstetrics;  Laterality: N/A;   CHOLECYSTECTOMY  2011   Dr.Byerly   COLONOSCOPY WITH PROPOFOL N/A 05/21/2022   Procedure: COLONOSCOPY WITH PROPOFOL;  Surgeon: Dolores Frame, MD;  Location: [facility];  Service: Gastroenterology;  Laterality: N/A;  2:00pm, asa 2   ESOPHAGEAL DILATION N/A 02/26/2021   Procedure: ESOPHAGEAL DILATION;  Surgeon: Malissa Hippo, MD;  Location: [facility];  Service: Endoscopy;  Laterality: N/A;   ESOPHAGEAL DILATION N/A 08/19/2022   Procedure: ESOPHAGEAL DILATION;  Surgeon: Dolores Frame, MD;  Location: [facility];  Service: Gastroenterology;  Laterality: N/A;   ESOPHAGOGASTRODUODENOSCOPY N/A 10/26/2014   Procedure: ESOPHAGOGASTRODUODENOSCOPY  (EGD);  Surgeon: Malissa Hippo, MD;  Location: [facility];  Service: Endoscopy;  Laterality: N/A;   ESOPHAGOGASTRODUODENOSCOPY (EGD) WITH PROPOFOL N/A 02/26/2021   Procedure: ESOPHAGOGASTRODUODENOSCOPY (EGD) WITH PROPOFOL;  Surgeon: Malissa Hippo, MD;  Location: [facility];  Service: Endoscopy;  Laterality: N/A;   ESOPHAGOGASTRODUODENOSCOPY (EGD) WITH PROPOFOL N/A 08/19/2022   Procedure: ESOPHAGOGASTRODUODENOSCOPY (EGD) WITH PROPOFOL;  Surgeon: Dolores Frame, MD;  Location: [facility];  Service: Gastroenterology;  Laterality: N/A;  200pm, asa 2   POLYPECTOMY  02/26/2021   Procedure: POLYPECTOMY;  Surgeon: Malissa Hippo, MD;  Location: [facility];  Service: Endoscopy;;  Gastric Body Polyps x 3    TYMPANOSTOMY TUBE PLACEMENT     UPPER GASTROINTESTINAL ENDOSCOPY  12/05/2009   UPPER GASTROINTESTINAL ENDOSCOPY  02/07/2009   WISDOM TOOTH EXTRACTION     WISDOM TOOTH EXTRACTION     Patient Active Problem List   Diagnosis Date Noted   Acute midline low back pain with left-sided sciatica 11/24/2022   Iron deficiency anemia 11/11/2022   Esophageal dysphagia 08/06/2022   Rash and nonspecific skin eruption 07/21/2022   Other atopic dermatitis 07/21/2022   Gastrointestinal complaints 07/21/2022   Pruritus 07/21/2022   Other adverse food reactions, not elsewhere classified, subsequent encounter 07/21/2022   IBS (irritable bowel syndrome) 05/04/2022   Rectal bleeding 05/04/2022   Adjustment disorder 02/20/2022   Dyssomnia 02/20/2022  Attention deficit hyperactivity disorder (ADHD), predominantly inattentive type 02/20/2022   History of bipolar disorder 02/20/2022   GAD (generalized anxiety disorder) 08/26/2021   Current moderate 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 episode (HCC) 08/26/2021   Esophageal dysmotility    RUQ abdominal pain    Vaginal irritation 04/22/2021   Vaginal discharge 04/01/2021   Routine general medical examination at a health care  facility 02/06/2021   Patient desires pregnancy 02/06/2021   Bilateral carpal tunnel syndrome 11/06/2020   Spotting 12/14/2018   Carpal tunnel syndrome, left upper limb 06/01/2018   Migraine 12/09/2017   Iron deficiency 07/20/2017   Allergic otitis media of both ears 03/24/2017   History of severe pre-eclampsia 02/23/2017   History of anemia 02/23/2017   Previous cesarean section 02/09/2017   Postoperative anemia due to acute blood loss 01/06/2017   Constipation 10/27/2014   Gastroesophageal reflux disease 07/01/2011   REFERRING PROVIDER: Gabriel Earing, FNP   REFERRING DIAG: Low back pain radiating to right lower extremity, Right hip pain   Rationale for Evaluation and Treatment: Rehabilitation  THERAPY DIAG:  Radiculopathy, lumbar region  Muscle weakness (generalized)  ONSET DATE: 4 months ago  SUBJECTIVE:                                                                                                                                                                                           SUBJECTIVE STATEMENT: Pt reports 2/10 right low back and LE pain.  Pt states she had a thyroid biopsy yesterday and is having soreness from that procedure.  PERTINENT HISTORY:  History of migraines, anxiety, depression, ADHD, bipolar disorder, and allergies  PAIN:  Are you having pain? Yes: NPRS scale: Current: 2/10 Pain location: right low back and leg Pain description: constant, throbbing, aching, and occasional cramping Aggravating factors: standing, walking,getting out of bed, and moving Relieving factors: sitting, and changing positions  PRECAUTIONS: None  RED FLAGS: None   WEIGHT BEARING RESTRICTIONS: No  FALLS:  Has patient fallen in last 6 months? 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  DIAGNOSTIC  FINDINGS: 08/26/23 lumbar, R hip, and R knee IMPRESSION: 1. Mild-to-moderate L5-S1 degenerative disc and endplate changes. 2. 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  IMPRESSION: Minimal right acetabular degenerative spurring. No significant osteoarthritis.  IMPRESSION: Normal right knee radiographs. PATIENT SURVEYS:  Modified Oswestry 22% disability   COGNITION: Overall cognitive status: Within functional limits for tasks assessed     SENSATION: Patient reports intermittent tingling in her right low back at least once per day for a few minutes, but none currently  PALPATION: TTP: bilateral lumbar paraspinals (R side reproduced familiar pain), bilateral piriformis (R reproduced sharp hip pain)    LUMBAR JOINT MOBILITY:  L1-4: WFL and nonpainful  L4-5: hypomobile and painful  LUMBAR ROM:   AROM eval  Flexion 8  Extension 10  Right lateral flexion 25% limited; painful  Left lateral flexion WFL; strain  Right rotation WFL   Left rotation 25% limited   (Blank rows = not tested)  LOWER EXTREMITY ROM: WFL for activities assessed  LOWER EXTREMITY MMT:    MMT Right eval Left eval  Hip flexion 4/5; right thigh and knee pain 5/5  Hip extension    Hip abduction    Hip adduction    Hip internal rotation    Hip external rotation    Knee flexion 4+/5; sore 5/5  Knee extension 4/5 5/5  Ankle dorsiflexion    Ankle plantarflexion    Ankle inversion    Ankle eversion     (Blank rows = not tested)  GAIT: Assistive device utilized: None Level of assistance:  Complete Independence Comments: decreased gait speed and stride length  TREATMENT DATE:                                                                                                                                09/23/23                                   EXERCISE LOG  Exercise Repetitions and Resistance Comments  Nustep Lvl 3 x 15 mins   LAQs 2# x 20 reps bil   Seated Marches 2# x 20 reps bil   Seated Hip Adduction 3 mins   Seated Hip Abduction Red x 3 mins   Seated Ham Curls Red x 20 reps bil    Blank cell = exercise not performed today   Manual Therapy Soft Tissue Mobilization: Right low back and hip, STW/M to right lumbar paraspinals and QL to decrease pain and tone with pt sitting leaning over elevated treatment table    PATIENT EDUCATION:  Education details: plan of care, prognosis, healing, anatomy, referred pain, and goals for physical therapy Person educated: Patient Education method: Explanation Education comprehension: verbalized understanding  HOME EXERCISE PROGRAM:   ASSESSMENT:  CLINICAL IMPRESSION: Pt arrives for today's treatment session reporting 2/10 right low back and LE pain.  Pt  able to tolerate Nustep today for warm up without increase in pain.  Pt instructed in several seated exercises to increase strength and function while decreasing pain.  Pt requiring min cues for proper technique and posture with all newly added exercises.  STW/M performed to right lumbar paraspinals and QL to decrease pain and tone.  Pt given tennis ball of home IASTW with pt able to demonstrate proper technique.  Pt reported minimal decrease in pain at completion of today's treatment session.   OBJECTIVE IMPAIRMENTS: Abnormal gait, decreased activity tolerance, decreased mobility, difficulty walking, decreased ROM, decreased strength, hypomobility, impaired tone, and pain.   ACTIVITY LIMITATIONS: lifting, bending, standing, squatting, stairs, transfers, locomotion level, and caring  for others  PARTICIPATION LIMITATIONS: cleaning, shopping, community activity, and occupation  PERSONAL FACTORS: Past/current experiences, Time since onset of injury/illness/exacerbation, and 3+ comorbidities: History of migraines, anxiety, depression, ADHD, bipolar disorder, and allergies  are also affecting patient's functional outcome.   REHAB POTENTIAL: Good  CLINICAL DECISION MAKING: Evolving/moderate complexity  EVALUATION COMPLEXITY: Moderate   GOALS: Goals reviewed with patient? Yes  SHORT TERM GOALS: Target date: 10/06/23  Patient will be independent with her initial HEP. Baseline: Goal status: INITIAL  2.  Patient will be able to complete her daily activities without her familiar pain exceeding 6/10. Baseline:  Goal status: INITIAL  3.  Patient will improve her active lumbar flexion to at least 20 degrees for improved function picking up items from the floor. Baseline:  Goal status: INITIAL  LONG TERM GOALS: Target date: 10/27/23  Patient will be independent with her advanced HEP. Baseline:  Goal status: INITIAL  2.  Patient will improve her ODI score to 12% disability or less for improved perceived function with her daily activities. Baseline:  Goal status: INITIAL  3.  Patient will be able to navigate at least 4 steps with a reciprocal pattern for improved household mobility. Baseline:  Goal status: INITIAL  4.  Patient will be able to demonstrate proper lifting mechanics to reduce her risk of reinjury. Baseline:  Goal status: INITIAL  5.  Patient will be able to complete her daily activities without her familiar symptoms exceeding 4/10. Baseline:  Goal status: INITIAL  PLAN:  PT FREQUENCY: 2x/week  PT DURATION: 6 weeks  PLANNED INTERVENTIONS: 97164- PT Re-evaluation, 97110-Therapeutic exercises, 97530- Therapeutic activity, 97112- Neuromuscular re-education, 97535- Self Care, 16109- Manual therapy, Patient/Family education, Balance training, Stair  training, Taping, Dry Needling, Joint mobilization, Joint manipulation, Spinal manipulation, Spinal mobilization, Cryotherapy, and Moist heat.  PLAN FOR NEXT SESSION: Nustep or recumbent bike, manual therapy, lumbar and lower extremity strengthening   Newman Pies, PTA 09/23/2023, 11:06 AM

## 2023-09-27 LAB — CYTOLOGY - NON PAP

## 2023-09-27 NOTE — Telephone Encounter (Signed)
 Fax received from [facility] is required for EGD

## 2023-09-28 ENCOUNTER — Ambulatory Visit

## 2023-09-28 ENCOUNTER — Telehealth: Payer: Self-pay | Admitting: Family Medicine

## 2023-09-28 DIAGNOSIS — M5416 Radiculopathy, lumbar region: Secondary | ICD-10-CM | POA: Diagnosis not present

## 2023-09-28 DIAGNOSIS — M6281 Muscle weakness (generalized): Secondary | ICD-10-CM | POA: Diagnosis not present

## 2023-09-28 NOTE — Therapy (Signed)
 OUTPATIENT PHYSICAL THERAPY THORACOLUMBAR TREATMENT    Patient Name: Mckenzie Cooley MRN: [id number] DOB:1991-02-24, 33 y.o., female Today's Date: 09/28/2023  END OF SESSION:  PT End of Session - 09/28/23 1523     Visit Number 3    Number of Visits 12    Date for PT Re-Evaluation 11/19/23    Authorization - Number of Visits 6    PT Start Time 1515    PT Stop Time 1559    PT Time Calculation (min) 44 min    Activity Tolerance Patient tolerated treatment well    Behavior During Therapy [facility] for tasks assessed/performed              Past Medical History:  Diagnosis Date   Acid reflux    Anemia    Bloating    Childhood asthma    Eczema    Esophageal dysmotility    History of blood transfusion    Positive urine pregnancy test 12/14/2018   Preeclampsia    Seasonal allergies    Past Surgical History:  Procedure Laterality Date   BIOPSY  02/26/2021   Procedure: BIOPSY;  Surgeon: Malissa Hippo, MD;  [facility];  Service: Endoscopy;;  Esophageal biopsy   carpal tunnel Left 01/30/2021   CARPAL TUNNEL RELEASE Right 03/27/2021   CESAREAN SECTION N/A 01/02/2017   Procedure: CESAREAN SECTION;  Surgeon: Hermina Staggers, MD;  Location: [facility]IRTH[facility] SUIT[facility];  Service: Obstetrics;  Laterality: N/A;   CHOLECYSTECTOMY  2011   Dr.Byerly   COLONOSCOPY WITH PROPOFOL N/A 05/21/2022   Procedure: COLONOSCOPY WITH PROPOFOL;  Surgeon: Dolores Frame, MD;  Location: [facility];  Service: Gastroenterology;  Laterality: N/A;  2:00pm, asa 2   ESOPHAGEAL DILATION N/A 02/26/2021   Procedure: ESOPHAGEAL DILATION;  Surgeon: Malissa Hippo, MD;  [facility]: [facility];  Service: Endoscopy;  Laterality: N/A;   ESOPHAGEAL DILATION N/A 08/19/2022   Procedure: ESOPHAGEAL DILATION;  Surgeon: Dolores Frame, MD;  [facility];  Service: Gastroenterology;  Laterality: N/A;   ESOPHAGOGASTRODUODENOSCOPY N/A 10/26/2014   Procedure: ESOPHAGOGASTRODUODENOSCOPY  (EGD);  Surgeon: Malissa Hippo, MD;  [facility];  Service: Endoscopy;  Laterality: N/A;   ESOPHAGOGASTRODUODENOSCOPY (EGD) WITH PROPOFOL N/A 02/26/2021   Procedure: ESOPHAGOGASTRODUODENOSCOPY (EGD) WITH PROPOFOL;  Surgeon: Malissa Hippo, MD;  [facility];  Service: Endoscopy;  Laterality: N/A;   ESOPHAGOGASTRODUODENOSCOPY (EGD) WITH PROPOFOL N/A 08/19/2022   Procedure: ESOPHAGOGASTRODUODENOSCOPY (EGD) WITH PROPOFOL;  Surgeon: Dolores Frame, MD;  [facility];  Service: Gastroenterology;  Laterality: N/A;  200pm, asa 2   POLYPECTOMY  02/26/2021   Procedure: POLYPECTOMY;  Surgeon: Malissa Hippo, MD;  [facility];  Service: Endoscopy;;  Gastric Body Polyps x 3    TYMPANOSTOMY TUBE PLACEMENT     UPPER GASTROINTESTINAL ENDOSCOPY  12/05/2009   UPPER GASTROINTESTINAL ENDOSCOPY  02/07/2009   WISDOM TOOTH EXTRACTION     WISDOM TOOTH EXTRACTION     Patient Active Problem List   Diagnosis Date Noted   Acute midline low back pain with left-sided sciatica 11/24/2022   Iron deficiency anemia 11/11/2022   Esophageal dysphagia 08/06/2022   Rash and nonspecific skin eruption 07/21/2022   Other atopic dermatitis 07/21/2022   Gastrointestinal complaints 07/21/2022   Pruritus 07/21/2022   Other adverse food reactions, not elsewhere classified, subsequent encounter 07/21/2022   IBS (irritable bowel syndrome) 05/04/2022   Rectal bleeding 05/04/2022   Adjustment disorder 02/20/2022   Dyssomnia 02/20/2022  Attention deficit hyperactivity disorder (ADHD), predominantly inattentive type 02/20/2022   History of bipolar disorder 02/20/2022   GAD (generalized anxiety disorder) 08/26/2021   Current moderate 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 episode (HCC) 08/26/2021   Esophageal dysmotility    RUQ abdominal pain    Vaginal irritation 04/22/2021   Vaginal discharge 04/01/2021   Routine general medical examination at a health care  facility 02/06/2021   Patient desires pregnancy 02/06/2021   Bilateral carpal tunnel syndrome 11/06/2020   Spotting 12/14/2018   Carpal tunnel syndrome, left upper limb 06/01/2018   Migraine 12/09/2017   Iron deficiency 07/20/2017   Allergic otitis media of both ears 03/24/2017   History of severe pre-eclampsia 02/23/2017   History of anemia 02/23/2017   Previous cesarean section 02/09/2017   Postoperative anemia due to acute blood loss 01/06/2017   Constipation 10/27/2014   Gastroesophageal reflux disease 07/01/2011   REFERRING PROVIDER: Gabriel Earing, FNP   REFERRING DIAG: Low back pain radiating to right lower extremity, Right hip pain   Rationale for Evaluation and Treatment: Rehabilitation  THERAPY DIAG:  Radiculopathy, lumbar region  Muscle weakness (generalized)  ONSET DATE: 4 months ago  SUBJECTIVE:                                                                                                                                                                                           SUBJECTIVE STATEMENT: Patient reports that she feels about the same today. However, she started using lidocaine patches which really help her pain.   PERTINENT HISTORY:  History of migraines, anxiety, depression, ADHD, bipolar disorder, and allergies  PAIN:  Are you having pain? Yes: NPRS scale: Current: no pain score provided Pain location: right low back and leg Pain description: constant, throbbing, aching, and occasional cramping Aggravating factors: standing, walking,getting out of bed, and moving Relieving factors: sitting, and changing positions  PRECAUTIONS: None  RED FLAGS: None   WEIGHT BEARING RESTRICTIONS: No  FALLS:  Has patient fallen in last 6 months? 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  DIAGNOSTIC FINDINGS:  08/26/23 lumbar, R hip, and R knee IMPRESSION: 1. Mild-to-moderate L5-S1 degenerative disc and endplate changes. 2. 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  IMPRESSION: Minimal right acetabular degenerative spurring. No significant osteoarthritis.  IMPRESSION: Normal right knee radiographs. PATIENT SURVEYS:  Modified Oswestry 22% disability   COGNITION: Overall cognitive status: Within functional limits for tasks assessed     SENSATION: Patient reports intermittent tingling in her right low back at least once per day for a few minutes, but none currently  PALPATION: TTP: bilateral lumbar paraspinals (R side reproduced familiar pain), bilateral piriformis (R reproduced sharp hip pain)    LUMBAR JOINT MOBILITY:  L1-4: WFL and nonpainful  L4-5: hypomobile and painful  LUMBAR ROM:   AROM eval  Flexion 8  Extension 10  Right lateral flexion 25% limited; painful  Left lateral flexion WFL; strain  Right rotation WFL   Left rotation 25% limited   (Blank rows = not tested)  LOWER EXTREMITY ROM: WFL for activities assessed  LOWER EXTREMITY MMT:    MMT Right eval Left eval  Hip flexion 4/5; right thigh and knee pain 5/5  Hip extension    Hip abduction    Hip adduction    Hip internal rotation    Hip external rotation    Knee flexion 4+/5; sore 5/5  Knee extension 4/5 5/5  Ankle dorsiflexion    Ankle plantarflexion    Ankle inversion    Ankle eversion     (Blank rows = not tested)  GAIT: Assistive device utilized: None Level of assistance:  Complete Independence Comments: decreased gait speed and stride length  TREATMENT DATE:                                                                                                                                                                  EXERCISE LOG  Exercise Repetitions and Resistance Comments  Nustep  L3 x 15.5 minutes    Blank cell = exercise not performed today  Manual Therapy Soft Tissue Mobilization: bilateral lumbar paraspinals, QL, and gluteals, for reduced pain and tone Joint Mobilizations: L3-5, grade I-III CPA's     09/23/23                                   EXERCISE LOG  Exercise Repetitions and Resistance Comments  Nustep Lvl 3 x 15 mins   LAQs 2# x 20 reps bil   Seated Marches 2# x 20 reps bil   Seated Hip Adduction 3 mins   Seated Hip Abduction Red x 3 mins   Seated Ham Curls Red x 20 reps bil    Blank cell = exercise not performed today   Manual Therapy  Soft Tissue Mobilization: Right low back and hip, STW/M to right lumbar paraspinals and QL to decrease pain and tone with pt sitting leaning over elevated treatment table    PATIENT EDUCATION:  Education details: plan of care, prognosis, healing, anatomy, referred pain, and goals for physical therapy Person educated: Patient Education method: Explanation Education comprehension: verbalized understanding  HOME EXERCISE PROGRAM:   ASSESSMENT:  CLINICAL IMPRESSION: Patient presented to treatment with elevated pain levels which limited her ability to be introduced to new interventions. Manual therapy was the most effective at reducing her familiar symptoms with lumbar joint mobilizations being the most effective. She reported that she was still hurting, but not as bad upon the conclusion of treatment. She continues to require skilled physical therapy to address her remaining impairments to return to her prior level of function   OBJECTIVE IMPAIRMENTS: Abnormal gait, decreased activity tolerance,  decreased mobility, difficulty walking, decreased ROM, decreased strength, hypomobility, impaired tone, and pain.   ACTIVITY LIMITATIONS: lifting, bending, standing, squatting, stairs, transfers, locomotion level, and caring for others  PARTICIPATION LIMITATIONS: cleaning, shopping, community activity, and occupation  PERSONAL FACTORS: Past/current experiences, Time since onset of injury/illness/exacerbation, and 3+ comorbidities: History of migraines, anxiety, depression, ADHD, bipolar disorder, and allergies  are also affecting patient's functional outcome.   REHAB POTENTIAL: Good  CLINICAL DECISION MAKING: Evolving/moderate complexity  EVALUATION COMPLEXITY: Moderate   GOALS: Goals reviewed with patient? Yes  SHORT TERM GOALS: Target date: 10/06/23  Patient will be independent with her initial HEP. Baseline: Goal status: INITIAL  2.  Patient will be able to complete her daily activities without her familiar pain exceeding 6/10. Baseline:  Goal status: INITIAL  3.  Patient will improve her active lumbar flexion to at least 20 degrees for improved function picking up items from the floor. Baseline:  Goal status: INITIAL  LONG TERM GOALS: Target date: 10/27/23  Patient will be independent with her advanced HEP. Baseline:  Goal status: INITIAL  2.  Patient will improve her ODI score to 12% disability or less for improved perceived function with her daily activities. Baseline:  Goal status: INITIAL  3.  Patient will be able to navigate at least 4 steps with a reciprocal pattern for improved household mobility. Baseline:  Goal status: INITIAL  4.  Patient will be able to demonstrate proper lifting mechanics to reduce her risk of reinjury. Baseline:  Goal status: INITIAL  5.  Patient will be able to complete her daily activities without her familiar symptoms exceeding 4/10. Baseline:  Goal status: INITIAL  PLAN:  PT FREQUENCY: 2x/week  PT DURATION: 6  weeks  PLANNED INTERVENTIONS: 97164- PT Re-evaluation, 97110-Therapeutic exercises, 97530- Therapeutic activity, 97112- Neuromuscular re-education, 97535- Self Care, 78469- Manual therapy, Patient/Family education, Balance training, Stair training, Taping, Dry Needling, Joint mobilization, Joint manipulation, Spinal manipulation, Spinal mobilization, Cryotherapy, and Moist heat.  PLAN FOR NEXT SESSION: Nustep or recumbent bike, manual therapy, lumbar and lower extremity strengthening   Granville Lewis, PT 09/28/2023, 4:12 PM

## 2023-09-29 ENCOUNTER — Encounter: Payer: Self-pay | Admitting: *Deleted

## 2023-09-29 ENCOUNTER — Ambulatory Visit: Admitting: *Deleted

## 2023-09-29 DIAGNOSIS — M5416 Radiculopathy, lumbar region: Secondary | ICD-10-CM

## 2023-09-29 DIAGNOSIS — M6281 Muscle weakness (generalized): Secondary | ICD-10-CM

## 2023-09-29 NOTE — Therapy (Signed)
 OUTPATIENT PHYSICAL THERAPY THORACOLUMBAR TREATMENT    Patient Name: Mckenzie Cooley MRN: [id number] DOB:18-Dec-1990, 33 y.o., female Today's Date: 09/29/2023  END OF SESSION:  PT End of Session - 09/29/23 0809     Visit Number 4    Number of Visits 12    Date for PT Re-Evaluation 11/19/23    Authorization - Number of Visits 6    PT Start Time 0800    PT Stop Time 0846    PT Time Calculation (min) 46 min              Past Medical History:  Diagnosis Date   Acid reflux    Anemia    Bloating    Childhood asthma    Eczema    Esophageal dysmotility    History of blood transfusion    Positive urine pregnancy test 12/14/2018   Preeclampsia    Seasonal allergies    Past Surgical History:  Procedure Laterality Date   BIOPSY  02/26/2021   Procedure: BIOPSY;  Surgeon: Malissa Hippo, MD;  [facility];  Service: Endoscopy;;  Esophageal biopsy   carpal tunnel Left 01/30/2021   CARPAL TUNNEL RELEASE Right 03/27/2021   CESAREAN SECTION N/A 01/02/2017   Procedure: CESAREAN SECTION;  Surgeon: Hermina Staggers, MD;  Location: [facility];  Service: Obstetrics;  Laterality: N/A;   CHOLECYSTECTOMY  2011   Dr.Byerly   COLONOSCOPY WITH PROPOFOL N/A 05/21/2022   Procedure: COLONOSCOPY WITH PROPOFOL;  Surgeon: Dolores Frame, MD;  [facility]: [facility];  Service: Gastroenterology;  Laterality: N/A;  2:00pm, asa 2   ESOPHAGEAL DILATION N/A 02/26/2021   Procedure: ESOPHAGEAL DILATION;  Surgeon: Malissa Hippo, MD;  [facility];  Service: Endoscopy;  Laterality: N/A;   ESOPHAGEAL DILATION N/A 08/19/2022   Procedure: ESOPHAGEAL DILATION;  Surgeon: Dolores Frame, MD;  [facility]: [facility];  Service: Gastroenterology;  Laterality: N/A;   ESOPHAGOGASTRODUODENOSCOPY N/A 10/26/2014   Procedure: ESOPHAGOGASTRODUODENOSCOPY (EGD);  Surgeon: Malissa Hippo, MD;  [facility]: [facility];  Service: Endoscopy;  Laterality: N/A;    ESOPHAGOGASTRODUODENOSCOPY (EGD) WITH PROPOFOL N/A 02/26/2021   Procedure: ESOPHAGOGASTRODUODENOSCOPY (EGD) WITH PROPOFOL;  Surgeon: Malissa Hippo, MD;  [facility];  Service: Endoscopy;  Laterality: N/A;   ESOPHAGOGASTRODUODENOSCOPY (EGD) WITH PROPOFOL N/A 08/19/2022   Procedure: ESOPHAGOGASTRODUODENOSCOPY (EGD) WITH PROPOFOL;  Surgeon: Dolores Frame, MD;  [facility];  Service: Gastroenterology;  Laterality: N/A;  200pm, asa 2   POLYPECTOMY  02/26/2021   Procedure: POLYPECTOMY;  Surgeon: Malissa Hippo, MD;  [facility];  Service: Endoscopy;;  Gastric Body Polyps x 3    TYMPANOSTOMY TUBE PLACEMENT     UPPER GASTROINTESTINAL ENDOSCOPY  12/05/2009   UPPER GASTROINTESTINAL ENDOSCOPY  02/07/2009   WISDOM TOOTH EXTRACTION     WISDOM TOOTH EXTRACTION     Patient Active Problem List   Diagnosis Date Noted   Acute midline low back pain with left-sided sciatica 11/24/2022   Iron deficiency anemia 11/11/2022   Esophageal dysphagia 08/06/2022   Rash and nonspecific skin eruption 07/21/2022   Other atopic dermatitis 07/21/2022   Gastrointestinal complaints 07/21/2022   Pruritus 07/21/2022   Other adverse food reactions, not elsewhere classified, subsequent encounter 07/21/2022   IBS (irritable bowel syndrome) 05/04/2022   Rectal bleeding 05/04/2022   Adjustment disorder 02/20/2022   Dyssomnia 02/20/2022   Attention deficit hyperactivity disorder (ADHD), predominantly inattentive type 02/20/2022   History of bipolar disorder 02/20/2022  GAD (generalized anxiety disorder) 08/26/2021   Current moderate 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 episode (HCC) 08/26/2021   Esophageal dysmotility    RUQ abdominal pain    Vaginal irritation 04/22/2021   Vaginal discharge 04/01/2021   Routine general medical examination at a health care facility 02/06/2021   Patient desires pregnancy 02/06/2021   Bilateral carpal tunnel syndrome  11/06/2020   Spotting 12/14/2018   Carpal tunnel syndrome, left upper limb 06/01/2018   Migraine 12/09/2017   Iron deficiency 07/20/2017   Allergic otitis media of both ears 03/24/2017   History of severe pre-eclampsia 02/23/2017   History of anemia 02/23/2017   Previous cesarean section 02/09/2017   Postoperative anemia due to acute blood loss 01/06/2017   Constipation 10/27/2014   Gastroesophageal reflux disease 07/01/2011   REFERRING PROVIDER: Gabriel Earing, FNP   REFERRING DIAG: Low back pain radiating to right lower extremity, Right hip pain   Rationale for Evaluation and Treatment: Rehabilitation  THERAPY DIAG:  Radiculopathy, lumbar region  Muscle weakness (generalized)  ONSET DATE: 4 months ago  SUBJECTIVE:                                                                                                                                                                                           SUBJECTIVE STATEMENT: Patient reports that  Her back is 7/10 today. The worst it has been.  LT and RT  PERTINENT HISTORY:  History of migraines, anxiety, depression, ADHD, bipolar disorder, and allergies  PAIN:  Are you having pain? Yes: NPRS scale: 7/10 Pain location: right low back and leg Pain description: constant, throbbing, aching, and occasional cramping Aggravating factors: standing, walking,getting out of bed, and moving Relieving factors: sitting, and changing positions  PRECAUTIONS: None  RED FLAGS: None   WEIGHT BEARING RESTRICTIONS: No  FALLS:  Has patient fallen in last 6 months? 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  DIAGNOSTIC FINDINGS: 08/26/23 lumbar, R hip, and R knee IMPRESSION: 1. Mild-to-moderate L5-S1 degenerative disc and endplate changes. 2. 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  IMPRESSION: Minimal right acetabular degenerative spurring. No significant osteoarthritis.  IMPRESSION: Normal right knee radiographs. PATIENT SURVEYS:  Modified Oswestry 22% disability   COGNITION: Overall cognitive status: Within functional limits for tasks assessed     SENSATION: Patient reports intermittent tingling in her right low back at least once per day for a few minutes, but none currently  PALPATION: TTP: bilateral lumbar paraspinals (R side reproduced familiar pain), bilateral piriformis (R reproduced sharp hip pain)    LUMBAR JOINT MOBILITY:  L1-4: WFL and nonpainful  L4-5: hypomobile and painful  LUMBAR ROM:   AROM eval  Flexion 8  Extension 10  Right lateral flexion 25% limited; painful  Left lateral flexion WFL; strain  Right rotation WFL   Left rotation 25% limited   (Blank rows = not tested)  LOWER EXTREMITY ROM: WFL for activities assessed  LOWER EXTREMITY MMT:    MMT Right eval Left eval  Hip flexion 4/5; right thigh and knee pain 5/5  Hip extension    Hip abduction    Hip adduction    Hip internal rotation    Hip external rotation    Knee flexion 4+/5; sore 5/5  Knee extension 4/5 5/5  Ankle dorsiflexion    Ankle plantarflexion    Ankle inversion    Ankle eversion     (Blank rows = not tested)  GAIT: Assistive device utilized: None Level of assistance: Complete Independence Comments: decreased gait speed and stride length  TREATMENT DATE:    09-29-23                                                                                                                                                               EXERCISE LOG  Exercise Repetitions and Resistance Comments  Nustep  L3 x 17 minutes    Blank cell = exercise not performed today  Manual Therapy Soft Tissue Mobilization: bilateral lumbar paraspinals, QL, and gluteals, for reduced pain and tone Joint Mobilizations:  ,     Discussed and reviewed AB  bracing with ADL's and transitional movements as well as movement patterns to decrease pain triggers.  Handout given for Home TENS 09/23/23                                   EXERCISE LOG  Exercise Repetitions and Resistance Comments  Nustep Lvl 3 x 15 mins   LAQs 2# x 20 reps bil   Seated Marches 2# x 20 reps bil   Seated Hip Adduction 3 mins   Seated Hip Abduction Red x 3 mins   Seated Ham Curls Red x 20 reps bil    Blank cell = exercise not performed today   Manual Therapy Soft Tissue  Mobilization: Right low back and hip, STW/M to right lumbar paraspinals and QL to decrease pain and tone with pt sitting leaning over elevated treatment table    PATIENT EDUCATION:  Education details: plan of care, prognosis, healing, anatomy, referred pain, and goals for physical therapy Person educated: Patient Education method: Explanation Education comprehension: verbalized understanding  HOME EXERCISE PROGRAM:   ASSESSMENT:  CLINICAL IMPRESSION: Patient  arrived with increased pain today LB RT and LT side 7/10. Rx focused on therex,  AB bracing with ADL's and transitional mvmnts as well as movement patterns to decrease pain triggers. Manual STW to LB paras and QL LT side with Pt prone. Home TENS handout given    OBJECTIVE IMPAIRMENTS: Abnormal gait, decreased activity tolerance, decreased mobility, difficulty walking, decreased ROM, decreased strength, hypomobility, impaired tone, and pain.   ACTIVITY LIMITATIONS: lifting, bending, standing, squatting, stairs, transfers, locomotion level, and caring for  others  PARTICIPATION LIMITATIONS: cleaning, shopping, community activity, and occupation  PERSONAL FACTORS: Past/current experiences, Time since onset of injury/illness/exacerbation, and 3+ comorbidities: History of migraines, anxiety, depression, ADHD, bipolar disorder, and allergies  are also affecting patient's functional outcome.   REHAB POTENTIAL: Good  CLINICAL DECISION MAKING: Evolving/moderate complexity  EVALUATION COMPLEXITY: Moderate   GOALS: Goals reviewed with patient? Yes  SHORT TERM GOALS: Target date: 10/06/23  Patient will be independent with her initial HEP. Baseline: Goal status: INITIAL  2.  Patient will be able to complete her daily activities without her familiar pain exceeding 6/10. Baseline:  Goal status: INITIAL  3.  Patient will improve her active lumbar flexion to at least 20 degrees for improved function picking up items from the floor. Baseline:  Goal status: INITIAL  LONG TERM GOALS: Target date: 10/27/23  Patient will be independent with her advanced HEP. Baseline:  Goal status: INITIAL  2.  Patient will improve her ODI score to 12% disability or less for improved perceived function with her daily activities. Baseline:  Goal status: INITIAL  3.  Patient will be able to navigate at least 4 steps with a reciprocal pattern for improved household mobility. Baseline:  Goal status: INITIAL  4.  Patient will be able to demonstrate proper lifting mechanics to reduce her risk of reinjury. Baseline:  Goal status: INITIAL  5.  Patient will be able to complete her daily activities without her familiar symptoms exceeding 4/10. Baseline:  Goal status: INITIAL  PLAN:  PT FREQUENCY: 2x/week  PT DURATION: 6 weeks  PLANNED INTERVENTIONS: 97164- PT Re-evaluation, 97110-Therapeutic exercises, 97530- Therapeutic activity, 97112- Neuromuscular re-education, 97535- Self Care, 62130- Manual therapy, Patient/Family education, Balance training, Stair  training, Taping, Dry Needling, Joint mobilization, Joint manipulation, Spinal manipulation, Spinal mobilization, Cryotherapy, and Moist heat.  PLAN FOR NEXT SESSION: Nustep or recumbent bike, manual therapy, lumbar and lower extremity strengthening   Grant Swager,CHRIS, PTA 09/29/2023, 9:12 AM

## 2023-09-30 ENCOUNTER — Telehealth: Payer: Self-pay

## 2023-09-30 DIAGNOSIS — E041 Nontoxic single thyroid nodule: Secondary | ICD-10-CM

## 2023-09-30 NOTE — Telephone Encounter (Signed)
 I'm still waiting for the Afirma test results that are part of the biopsy. I'll let her know as soon as I get those results.

## 2023-09-30 NOTE — Telephone Encounter (Signed)
 Copied from CRM [id number]. Topic: Clinical - Lab/Test Results >> Sep 30, 2023  2:21 PM Desma Mcgregor wrote: Reason for CRM: Pt calling about the results from her biopsy. Please follow up.

## 2023-10-04 ENCOUNTER — Ambulatory Visit: Admitting: Podiatry

## 2023-10-04 ENCOUNTER — Encounter: Payer: Self-pay | Admitting: Podiatry

## 2023-10-04 ENCOUNTER — Other Ambulatory Visit (HOSPITAL_COMMUNITY)
Admission: RE | Admit: 2023-10-04 | Discharge: 2023-10-04 | Disposition: A | Discharge status: Home / Self Care | Source: Ambulatory Visit | Attending: Gastroenterology | Admitting: Gastroenterology

## 2023-10-04 ENCOUNTER — Ambulatory Visit (INDEPENDENT_AMBULATORY_CARE_PROVIDER_SITE_OTHER)

## 2023-10-04 DIAGNOSIS — K224 Dyskinesia of esophagus: Secondary | ICD-10-CM | POA: Insufficient documentation

## 2023-10-04 DIAGNOSIS — M722 Plantar fascial fibromatosis: Secondary | ICD-10-CM | POA: Diagnosis not present

## 2023-10-04 LAB — PREGNANCY, URINE: Preg Test, Ur: NEGATIVE

## 2023-10-04 MED ORDER — TRIAMCINOLONE ACETONIDE 10 MG/ML IJ SUSP
10.0000 mg | Freq: Once | INTRAMUSCULAR | Status: AC
  Administered 2023-10-04: 10 mg via INTRA_ARTICULAR

## 2023-10-04 NOTE — Progress Notes (Signed)
 Subjective:   Patient ID: Mckenzie Cooley, female   DOB: 33 y.o.   MRN: [id number]   HPI Patient presents with a lot of pain in the bottom of the right heel and states it has been very sore been going on for a number of months patient does have obesity tries to be active does not smoke   Review of Systems  All other systems reviewed and are negative.       Objective:  Physical Exam Vitals and nursing note reviewed.  Constitutional:      Appearance: She is well-developed.  Pulmonary:     Effort: Pulmonary effort is normal.  Musculoskeletal:        General: Normal range of motion.  Skin:    General: Skin is warm.  Neurological:     Mental Status: She is alert.     Neurovascular status intact muscle strength found to be adequate range of motion found to be within normal limits with exquisite discomfort plantar aspect right heel at the insertional point of the tendon into the calcaneus with fluid buildup noted.  Patient did have good digital perfusion well-oriented x 3     Assessment:  Acute plantar fasciitis right with inflammation fluid of the medial band     Plan:  H&P reviewed and discussed plantar fascial inflammation reviewing x-ray.  Today sterile prep injected the plantar fascia at insertion 3 mg Kenalog 5 mg Xylocaine instructed on support therapy and shoe gear modifications physical therapy and will be seen back in several weeks may require other treatments depending on response  X-rays indicate small spur no indication stress fracture arthritis patient will be seen back by other physician and will not need to be seen back after that if she is doing well  X-rays indicate small spur no indication stress fracture arthritis

## 2023-10-04 NOTE — Patient Instructions (Signed)

## 2023-10-06 ENCOUNTER — Ambulatory Visit (HOSPITAL_BASED_OUTPATIENT_CLINIC_OR_DEPARTMENT_OTHER): Admitting: Anesthesiology

## 2023-10-06 ENCOUNTER — Encounter (HOSPITAL_COMMUNITY): Payer: Self-pay | Admitting: Gastroenterology

## 2023-10-06 ENCOUNTER — Other Ambulatory Visit: Payer: Self-pay

## 2023-10-06 ENCOUNTER — Ambulatory Visit (HOSPITAL_COMMUNITY)
Admission: RE | Admit: 2023-10-06 | Discharge: 2023-10-06 | Disposition: A | Discharge status: Home / Self Care | Attending: Gastroenterology | Admitting: Gastroenterology

## 2023-10-06 ENCOUNTER — Ambulatory Visit (HOSPITAL_COMMUNITY): Admitting: Anesthesiology

## 2023-10-06 ENCOUNTER — Encounter (HOSPITAL_COMMUNITY)
Admission: RE | Disposition: A | Discharge status: Home / Self Care | Payer: Self-pay | Source: Home / Self Care | Attending: Gastroenterology

## 2023-10-06 DIAGNOSIS — K219 Gastro-esophageal reflux disease without esophagitis: Secondary | ICD-10-CM | POA: Insufficient documentation

## 2023-10-06 DIAGNOSIS — I10 Essential (primary) hypertension: Secondary | ICD-10-CM | POA: Diagnosis not present

## 2023-10-06 DIAGNOSIS — F32A Depression, unspecified: Secondary | ICD-10-CM | POA: Diagnosis not present

## 2023-10-06 DIAGNOSIS — D509 Iron deficiency anemia, unspecified: Secondary | ICD-10-CM | POA: Insufficient documentation

## 2023-10-06 DIAGNOSIS — J449 Chronic obstructive pulmonary disease, unspecified: Secondary | ICD-10-CM | POA: Diagnosis not present

## 2023-10-06 DIAGNOSIS — K581 Irritable bowel syndrome with constipation: Secondary | ICD-10-CM | POA: Diagnosis not present

## 2023-10-06 DIAGNOSIS — Z79899 Other long term (current) drug therapy: Secondary | ICD-10-CM | POA: Insufficient documentation

## 2023-10-06 DIAGNOSIS — Z87891 Personal history of nicotine dependence: Secondary | ICD-10-CM | POA: Insufficient documentation

## 2023-10-06 DIAGNOSIS — Z6841 Body Mass Index (BMI) 40.0 and over, adult: Secondary | ICD-10-CM | POA: Insufficient documentation

## 2023-10-06 DIAGNOSIS — K224 Dyskinesia of esophagus: Secondary | ICD-10-CM

## 2023-10-06 DIAGNOSIS — R1310 Dysphagia, unspecified: Secondary | ICD-10-CM

## 2023-10-06 DIAGNOSIS — F419 Anxiety disorder, unspecified: Secondary | ICD-10-CM | POA: Diagnosis not present

## 2023-10-06 DIAGNOSIS — E66813 Obesity, class 3: Secondary | ICD-10-CM | POA: Diagnosis not present

## 2023-10-06 HISTORY — PX: ESOPHAGOGASTRODUODENOSCOPY: SHX5428

## 2023-10-06 SURGERY — EGD (ESOPHAGOGASTRODUODENOSCOPY)
Anesthesia: General

## 2023-10-06 MED ORDER — LIDOCAINE HCL (CARDIAC) PF 100 MG/5ML IV SOSY
PREFILLED_SYRINGE | INTRAVENOUS | Status: DC | PRN
  Administered 2023-10-06: 80 mg via INTRATRACHEAL

## 2023-10-06 MED ORDER — PROPOFOL 10 MG/ML IV BOLUS
INTRAVENOUS | Status: DC | PRN
  Administered 2023-10-06: 70 mg via INTRAVENOUS
  Administered 2023-10-06: 150 mg via INTRAVENOUS

## 2023-10-06 MED ORDER — LACTATED RINGERS IV SOLN: INTRAVENOUS | Status: DC | PRN

## 2023-10-06 NOTE — Anesthesia Procedure Notes (Signed)
 Date/Time: 10/06/2023 9:34 AM  Performed by: Verline Glow, CRNAPre-anesthesia Checklist: Patient identified, Emergency Drugs available, Suction available, Timeout performed and Patient being monitored Patient Re-evaluated:Patient Re-evaluated prior to induction Oxygen Delivery Method: Nasal cannula Comments: Optiflow

## 2023-10-06 NOTE — Op Note (Signed)
 [facility] Patient Name: Mckenzie Cooley Procedure Date: 10/06/2023 9:32 AM MRN: [id number] Date of Birth: April 06, 1991 Attending MD: Samantha Cress , , [id number] CSN: [id number] Age: 33 Admit Type: Outpatient Procedure:                Upper GI endoscopy Indications:              Dysphagia, history of IEM Providers:                Samantha Cress, Karyle Pagoda, RN, Sharlette Dayhoff                            Technician, Technician, [place] Wilson, Technician Referring MD:              Medicines:                Monitored Anesthesia Care Complications:            No immediate complications. Estimated Blood Loss:     Estimated blood loss: none. Procedure:                Pre-Anesthesia Assessment:                           - Prior to the procedure, a History and Physical                            was performed, and patient medications, allergies                            and sensitivities were reviewed. The patient's                            tolerance of previous anesthesia was reviewed.                           - The risks and benefits of the procedure and the                            sedation options and risks were discussed with the                            patient. All questions were answered and informed                            consent was obtained.                           - ASA Grade Assessment: I - A normal, healthy                            patient.                           After obtaining informed consent, the endoscope was                            passed under direct vision. Throughout the  procedure, the patient's blood pressure, pulse, and                            oxygen saturations were monitored continuously. The                            [id number]-H190 ([id number]) scope was introduced through the                            mouth, and advanced to the second part of duodenum.                            The upper GI endoscopy was  accomplished without                            difficulty. The patient tolerated the procedure                            well. Scope In: 9:41:00 AM Scope Out: 9:45:52 AM Total Procedure Duration: 0 hours 4 minutes 52 seconds  Findings:      No endoscopic abnormality was evident in the esophagus to explain the       patient's complaint of dysphagia. It was decided, however, to proceed       with dilation of the entire esophagus. A guidewire was placed and the       scope was withdrawn. Dilation was performed with a Savary dilator with       moderate resistance at 20 mm. The dilation site was examined following       endoscope reinsertion and showed mild mucosal disruption in the upper       esophagus.      The stomach was normal.      The examined duodenum was normal. Impression:               - No endoscopic esophageal abnormality to explain                            patient's dysphagia. Esophagus dilated. Dilated.                           - Normal stomach.                           - Normal examined duodenum.                           - No specimens collected. Moderate Sedation:      Per Anesthesia Care Recommendation:           - Discharge patient to home (ambulatory).                           - Resume previous diet.                           - Continue present medications. Procedure Code(s):        --- Professional ---  16109, Esophagogastroduodenoscopy, flexible,                            transoral; with insertion of guide wire followed by                            passage of dilator(s) through esophagus over guide                            wire Diagnosis Code(s):        --- Professional ---                           R13.10, Dysphagia, unspecified CPT copyright 2022 [employer]. All rights reserved. The codes documented in this report are preliminary and upon coder review may  be revised to meet current compliance  requirements. Samantha Cress, MD Samantha Cress,  10/06/2023 9:53:09 AM This report has been signed electronically. Number of Addenda: 0

## 2023-10-06 NOTE — Anesthesia Preprocedure Evaluation (Signed)
 Anesthesia Evaluation  Patient identified by MRN, date of birth, ID band Patient awake    Airway Mallampati: II  TM Distance: >3 FB     Dental no notable dental hx.    Pulmonary asthma , former smoker Asthma as a child   Pulmonary exam normal breath sounds clear to auscultation       Cardiovascular hypertension,  Rhythm:Regular Rate:Normal     Neuro/Psych  Headaches PSYCHIATRIC DISORDERS Anxiety Depression     Neuromuscular disease    GI/Hepatic Neg liver ROS,GERD  ,,  Endo/Other    Class 3 obesity  Renal/GU   negative genitourinary   Musculoskeletal negative musculoskeletal ROS (+)    Abdominal  (+) + obese  Peds  Hematology  (+) Blood dyscrasia, anemia   Anesthesia Other Findings   Reproductive/Obstetrics negative OB ROS                             Anesthesia Physical Anesthesia Plan  ASA: 2  Anesthesia Plan: General   Post-op Pain Management:    Induction:   PONV Risk Score and Plan: 1 and Propofol infusion  Airway Management Planned: Nasal Cannula  Additional Equipment:   Intra-op Plan:   Post-operative Plan:   Informed Consent: I have reviewed the patients History and Physical, chart, labs and discussed the procedure including the risks, benefits and alternatives for the proposed anesthesia with the patient or authorized representative who has indicated his/her understanding and acceptance.     Dental advisory given  Plan Discussed with: CRNA  Anesthesia Plan Comments:        Anesthesia Quick Evaluation

## 2023-10-06 NOTE — Interval H&P Note (Signed)
 History and Physical Interval Note:  10/06/2023 8:47 AM  Mckenzie Cooley  has presented today for surgery, with the diagnosis of DYSPHAGIA, ESOPHAGEAL DYSMOTILITY.  The various methods of treatment have been discussed with the patient and family. After consideration of risks, benefits and other options for treatment, the patient has consented to  Procedure(s) with comments: EGD (ESOPHAGOGASTRODUODENOSCOPY) (N/A) - 10:30AM;ASA 1 as a surgical intervention.  The patient's history has been reviewed, patient examined, no change in status, stable for surgery.  I have reviewed the patient's chart and labs.  Questions were answered to the patient's satisfaction.     Goebel Hellums Castaneda Mayorga

## 2023-10-06 NOTE — Discharge Instructions (Signed)
 You are being discharged to home.  Resume your previous diet.  Continue your present medications.

## 2023-10-06 NOTE — Anesthesia Postprocedure Evaluation (Signed)
 Anesthesia Post Note  Patient: Mckenzie Cooley  Procedure(s) Performed: EGD (ESOPHAGOGASTRODUODENOSCOPY)  Patient location during evaluation: PACU Anesthesia Type: General Level of consciousness: awake and alert Pain management: pain level controlled Vital Signs Assessment: post-procedure vital signs reviewed and stable Respiratory status: spontaneous breathing, nonlabored ventilation, respiratory function stable and patient connected to nasal cannula oxygen Cardiovascular status: stable and blood pressure returned to baseline Postop Assessment: no apparent nausea or vomiting Anesthetic complications: no  No notable events documented.   Last Vitals:  Vitals:   10/06/23 0904 10/06/23 0951  BP: (!) 127/49 120/68  Pulse: (!) 53 64  Resp: 15 19  Temp: 36.8 C (!) 36.4 C  SpO2: 100% 97%    Last Pain:  Vitals:   10/06/23 0951  TempSrc: Oral  PainSc: 0-No pain                 Beacher Limerick

## 2023-10-06 NOTE — Transfer of Care (Signed)
 Immediate Anesthesia Transfer of Care Note  Patient: Mckenzie Cooley  Procedure(s) Performed: EGD (ESOPHAGOGASTRODUODENOSCOPY)  Patient Location: Endoscopy Unit  Anesthesia Type:General  Level of Consciousness: drowsy and patient cooperative  Airway & Oxygen Therapy: Patient Spontanous Breathing  Post-op Assessment: Report given to RN and Post -op Vital signs reviewed and stable  Post vital signs: Reviewed and stable  Last Vitals:  Vitals Value Taken Time  BP 120/68 10/06/23 0951  Temp 36.4 C 10/06/23 0951  Pulse 64 10/06/23 0951  Resp 19 10/06/23 0951  SpO2 97 % 10/06/23 0951    Last Pain:  Vitals:   10/06/23 0951  TempSrc: Oral  PainSc: 0-No pain      Patients Stated Pain Goal: 7 (10/06/23 0904)  Complications: No notable events documented.

## 2023-10-07 ENCOUNTER — Ambulatory Visit

## 2023-10-07 ENCOUNTER — Telehealth (INDEPENDENT_AMBULATORY_CARE_PROVIDER_SITE_OTHER): Payer: Self-pay | Admitting: *Deleted

## 2023-10-07 ENCOUNTER — Other Ambulatory Visit (INDEPENDENT_AMBULATORY_CARE_PROVIDER_SITE_OTHER): Payer: Self-pay | Admitting: Gastroenterology

## 2023-10-07 ENCOUNTER — Telehealth (INDEPENDENT_AMBULATORY_CARE_PROVIDER_SITE_OTHER): Payer: Self-pay | Admitting: Gastroenterology

## 2023-10-07 DIAGNOSIS — M5416 Radiculopathy, lumbar region: Secondary | ICD-10-CM

## 2023-10-07 DIAGNOSIS — R1319 Other dysphagia: Secondary | ICD-10-CM

## 2023-10-07 DIAGNOSIS — M6281 Muscle weakness (generalized): Secondary | ICD-10-CM | POA: Diagnosis not present

## 2023-10-07 MED ORDER — NYSTATIN 100000 UNIT/ML MT SUSP: 5.0000 mL | Freq: Three times a day (TID) | OROMUCOSAL | 0 refills | Status: DC

## 2023-10-07 NOTE — Telephone Encounter (Signed)
 Patient had endoscopy yesterday. She said she her throat is sore and feels like it tightens when she tries to talk or cough. She said she never felt this way after an endoscopy. She said she has a cyst on her esophagus and had biopsy around march 29th and wonders if this is why her symptoms are different. Has not seen any bleeding. Very sore, hurts to talk, cough, burp, and swallow. No trouble breathing. Has not got results yet. She said its hard to eat without taking water with each bite, feels like air is coming up and has to hold her chest and feels like she has to burp but can't.  [phone]

## 2023-10-07 NOTE — Telephone Encounter (Signed)
 Esophagram ordered and scheduled for tomorrow at 9:00am. Pt contacted and made aware.

## 2023-10-07 NOTE — Therapy (Addendum)
 OUTPATIENT PHYSICAL THERAPY THORACOLUMBAR TREATMENT    Patient Name: Mckenzie Cooley MRN: [id number] DOB:[birth year], 33 y.o., female Today's Date: 10/07/2023  END OF SESSION:  PT End of Session - 10/07/23 0802     Visit Number 5    Number of Visits 12    Date for PT Re-Evaluation 11/19/23    Authorization - Number of Visits 6    PT Start Time 0800    PT Stop Time 0845    PT Time Calculation (min) 45 min              Past Medical History:  Diagnosis Date   Acid reflux    Anemia    Bloating    Childhood asthma    Eczema    Esophageal dysmotility    History of blood transfusion    Positive urine pregnancy test 12/14/2018   Preeclampsia    Seasonal allergies    Past Surgical History:  Procedure Laterality Date   BIOPSY  02/26/2021   Procedure: BIOPSY;  Surgeon: Malissa Hippo, MD;  Location: [facility];  Service: Endoscopy;;  Esophageal biopsy   carpal tunnel Left 01/30/2021   CARPAL TUNNEL RELEASE Right 03/27/2021   CESAREAN SECTION N/A 01/02/2017   Procedure: CESAREAN SECTION;  Surgeon: Hermina Staggers, MD;  Location: [facility];  Service: Obstetrics;  Laterality: N/A;   CHOLECYSTECTOMY  2011   Dr.Byerly   COLONOSCOPY WITH PROPOFOL N/A 05/21/2022   Procedure: COLONOSCOPY WITH PROPOFOL;  Surgeon: Dolores Frame, MD;  Location: [facility];  Service: Gastroenterology;  Laterality: N/A;  2:00pm, asa 2   ESOPHAGEAL DILATION N/A 02/26/2021   Procedure: ESOPHAGEAL DILATION;  Surgeon: Malissa Hippo, MD;  Location: [facility];  Service: Endoscopy;  Laterality: N/A;   ESOPHAGEAL DILATION N/A 08/19/2022   Procedure: ESOPHAGEAL DILATION;  Surgeon: Dolores Frame, MD;  Location: [facility];  Service: Gastroenterology;  Laterality: N/A;   ESOPHAGOGASTRODUODENOSCOPY N/A 10/26/2014   Procedure: ESOPHAGOGASTRODUODENOSCOPY (EGD);  Surgeon: Malissa Hippo, MD;  [facility]: [facility];  Service: Endoscopy;  Laterality: N/A;    ESOPHAGOGASTRODUODENOSCOPY (EGD) WITH PROPOFOL N/A 02/26/2021   Procedure: ESOPHAGOGASTRODUODENOSCOPY (EGD) WITH PROPOFOL;  Surgeon: Malissa Hippo, MD;  [facility]: [facility];  Service: Endoscopy;  Laterality: N/A;   ESOPHAGOGASTRODUODENOSCOPY (EGD) WITH PROPOFOL N/A 08/19/2022   Procedure: ESOPHAGOGASTRODUODENOSCOPY (EGD) WITH PROPOFOL;  Surgeon: Dolores Frame, MD;  Location: [facility];  Service: Gastroenterology;  Laterality: N/A;  200pm, asa 2   POLYPECTOMY  02/26/2021   Procedure: POLYPECTOMY;  Surgeon: Malissa Hippo, MD;  [facility]: [facility];  Service: Endoscopy;;  Gastric Body Polyps x 3    TYMPANOSTOMY TUBE PLACEMENT     UPPER GASTROINTESTINAL ENDOSCOPY  12/05/2009   UPPER GASTROINTESTINAL ENDOSCOPY  02/07/2009   WISDOM TOOTH EXTRACTION     WISDOM TOOTH EXTRACTION     Patient Active Problem List   Diagnosis Date Noted   Acute midline low back pain with left-sided sciatica 11/24/2022   Iron deficiency anemia 11/11/2022   Esophageal dysphagia 08/06/2022   Rash and nonspecific skin eruption 07/21/2022   Other atopic dermatitis 07/21/2022   Gastrointestinal complaints 07/21/2022   Pruritus 07/21/2022   Other adverse food reactions, not elsewhere classified, subsequent encounter 07/21/2022   IBS (irritable bowel syndrome) 05/04/2022   Rectal bleeding 05/04/2022   Adjustment disorder 02/20/2022   Dyssomnia 02/20/2022   Attention deficit hyperactivity disorder (ADHD), predominantly inattentive type 02/20/2022   History of bipolar disorder 02/20/2022  GAD (generalized anxiety disorder) 08/26/2021   Current moderate 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 episode (HCC) 08/26/2021   Ineffective esophageal motility    RUQ abdominal pain    Vaginal irritation 04/22/2021   Vaginal discharge 04/01/2021   Routine general medical examination at a health care facility 02/06/2021   Patient desires pregnancy 02/06/2021   Bilateral carpal tunnel syndrome  11/06/2020   Spotting 12/14/2018   Carpal tunnel syndrome, left upper limb 06/01/2018   Migraine 12/09/2017   Iron deficiency 07/20/2017   Allergic otitis media of both ears 03/24/2017   History of severe pre-eclampsia 02/23/2017   History of anemia 02/23/2017   Previous cesarean section 02/09/2017   Postoperative anemia due to acute blood loss 01/06/2017   Constipation 10/27/2014   Gastroesophageal reflux disease 07/01/2011   REFERRING PROVIDER: Albertha Huger, FNP   REFERRING DIAG: Low back pain radiating to right lower extremity, Right hip pain   Rationale for Evaluation and Treatment: Rehabilitation  THERAPY DIAG:  Radiculopathy, lumbar region  Muscle weakness (generalized)  ONSET DATE: 4 months ago  SUBJECTIVE:                                                                                                                                                                                           SUBJECTIVE STATEMENT: Patient reports 2/10 left low back and left hip pain.    PERTINENT HISTORY:  History of migraines, anxiety, depression, ADHD, bipolar disorder, and allergies  PAIN:  Are you having pain? Yes: NPRS scale: 2/10 Pain location: left low back and left hip Pain description: constant, throbbing, aching, and occasional cramping Aggravating factors: standing, walking,getting out of bed, and moving Relieving factors: sitting, and changing positions  PRECAUTIONS: None  RED FLAGS: None   WEIGHT BEARING RESTRICTIONS: No  FALLS:  Has patient fallen in last 6 months? 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  DIAGNOSTIC FINDINGS: 08/26/23 lumbar, R hip, and R knee IMPRESSION: 1. Mild-to-moderate L5-S1 degenerative disc and endplate changes. 2. 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  IMPRESSION: Minimal right  acetabular degenerative spurring. No significant osteoarthritis.  IMPRESSION: Normal right knee radiographs. PATIENT SURVEYS:  Modified Oswestry 22% disability   COGNITION: Overall cognitive status: Within functional limits for tasks assessed     SENSATION: Patient reports intermittent tingling in her right low back at least once per day for a few minutes, but none currently  PALPATION: TTP: bilateral lumbar paraspinals (R side reproduced familiar pain), bilateral piriformis (R reproduced sharp hip pain)    LUMBAR JOINT MOBILITY:  L1-4: WFL and nonpainful  L4-5: hypomobile and painful  LUMBAR ROM:   AROM eval  Flexion 8  Extension 10  Right lateral flexion 25% limited; painful  Left lateral flexion WFL; strain  Right rotation WFL   Left rotation 25% limited   (Blank rows = not tested)  LOWER EXTREMITY ROM: WFL for activities assessed  LOWER EXTREMITY MMT:    MMT Right eval Left eval  Hip flexion 4/5; right thigh and knee pain 5/5  Hip extension    Hip abduction    Hip adduction    Hip internal rotation    Hip external rotation    Knee flexion 4+/5; sore 5/5  Knee extension 4/5 5/5  Ankle dorsiflexion    Ankle plantarflexion    Ankle inversion    Ankle eversion     (Blank rows = not tested)  GAIT: Assistive device utilized: None Level of assistance: Complete Independence Comments: decreased gait speed and stride length  TREATMENT DATE:       10/07/23                         EXERCISE LOG  Exercise Repetitions and Resistance  Comments  Nustep Lvl 3 x 15 mins   Rockerboard 3 mins   Forward Lunge 14" box x 3 mins   Forward Step Up 6" box 20 reps   Squats 30# x 5 reps; 50# x 3 reps   LAQs 2# x 25 reps bil   Seated Marches 2# x 25 reps bil   Seated Hip Abduction    Seated Hip Adduction    Seated Ham Curls     Blank cell = exercise not performed today   09-29-23                                                                                                                                                              EXERCISE LOG  Exercise Repetitions and Resistance Comments  Nustep  L3 x 17 minutes    Blank cell = exercise not performed today  Manual Therapy Soft Tissue Mobilization: bilateral lumbar paraspinals, QL, and gluteals, for reduced pain and tone Joint Mobilizations:  ,     Discussed and reviewed AB  bracing with ADL's and transitional movements as well as  movement patterns to decrease pain triggers.  Handout given for Home TENS 09/23/23                                   EXERCISE LOG  Exercise Repetitions and Resistance Comments  Nustep Lvl 3 x 15 mins   LAQs 2# x 20 reps bil   Seated Marches 2# x 20 reps bil   Seated Hip Adduction 3 mins   Seated Hip Abduction Red x 3 mins   Seated Ham Curls Red x 20 reps bil    Blank cell = exercise not performed today   Manual Therapy Soft Tissue Mobilization: Right low back and hip, STW/M to right lumbar paraspinals and QL to decrease pain and tone with pt sitting leaning over elevated treatment table    PATIENT EDUCATION:  Education details: plan of care, prognosis, healing, anatomy, referred pain, and goals for physical therapy Person educated: Patient Education method: Explanation Education comprehension: verbalized understanding  HOME EXERCISE PROGRAM:   ASSESSMENT:  CLINICAL IMPRESSION: Pt arrives for today's treatment session reporting 2/10 left low back/hip pain.  Pt able to demonstrate 70 degrees of lumbar flexion today, meeting her LTG.   Pt able to decrease ODI socre to 10%, meeting that goal as well.  Pt requiring min cues for proper lifting techniques, but able to lift 50# box to simulate work activities with proper body mechanics.  Pt has met all the goals set forth by physical therapy at this time.  Pt encouraged to call the facility with any questions or concerns.  Pt ready to discharge at this time.   PHYSICAL THERAPY DISCHARGE SUMMARY  Visits from Start of Care: 5  Current functional level related to goals / functional outcomes: Patient was able to meet all of her goals for skilled physical therapy. She felt comfortable being discharged at this time.    Remaining deficits: None   Education / Equipment: HEP    Patient agrees to discharge. Patient goals were met. Patient is being discharged due to meeting the stated rehab goals.  Candi Leash, PT, DPT    OBJECTIVE IMPAIRMENTS: Abnormal gait, decreased activity tolerance, decreased mobility, difficulty walking, decreased ROM, decreased strength, hypomobility, impaired tone, and pain.   ACTIVITY LIMITATIONS: lifting, bending, standing, squatting, stairs, transfers, locomotion level, and caring for others  PARTICIPATION LIMITATIONS: cleaning, shopping, community activity, and occupation  PERSONAL FACTORS: Past/current experiences, Time since onset of injury/illness/exacerbation, and 3+ comorbidities: History of migraines, anxiety, depression, ADHD, bipolar disorder, and allergies  are also affecting patient's functional outcome.   REHAB POTENTIAL: Good  CLINICAL DECISION MAKING: Evolving/moderate complexity  EVALUATION COMPLEXITY: Moderate   GOALS: Goals reviewed with patient? Yes  SHORT TERM GOALS: Target date: 10/06/23  Patient will be independent with her initial HEP. Baseline: Goal status: MET  2.  Patient will be able to complete her daily activities without her familiar pain exceeding 6/10. Baseline:  Goal status: MET  3.  Patient will improve  her active lumbar flexion to at least 20 degrees for improved function picking up items from the floor. Baseline: 4/14: 70 degrees Goal status: MET  LONG TERM GOALS: Target date: 10/27/23  Patient will be independent with her advanced HEP. Baseline:  Goal status: MET  2.  Patient will improve her ODI score to 12% disability or less for improved perceived function with her daily activities. Baseline: 4/14: 5/50 - 10% Goal status: MET  3.  Patient will be able to navigate at least 4 steps with a reciprocal pattern for improved household mobility. Baseline:  Goal status: MET  4.  Patient will be able to demonstrate proper lifting mechanics to reduce her risk of reinjury. Baseline:  Goal status: MET  5.  Patient will be able to complete her daily activities without her familiar symptoms exceeding 4/10. Baseline:  Goal status: MET  PLAN:  PT FREQUENCY: 2x/week  PT DURATION: 6 weeks  PLANNED INTERVENTIONS: 97164- PT Re-evaluation, 97110-Therapeutic exercises, 97530- Therapeutic activity, 97112- Neuromuscular re-education, 97535- Self Care, 40981- Manual therapy, Patient/Family education, Balance training, Stair training, Taping, Dry Needling, Joint mobilization, Joint manipulation, Spinal manipulation, Spinal mobilization, Cryotherapy, and Moist heat.  PLAN FOR NEXT SESSION: Nustep or recumbent bike, manual therapy, lumbar and lower extremity strengthening   Deryl Flora, PTA 10/07/2023, 8:57 AM

## 2023-10-07 NOTE — Telephone Encounter (Signed)
 Spoke to patient, discussed symptoms. Having hoarseness and pain when swallowing. Some dysphagia but able to swallow. No SOB or fever. I will send a magic mouthwash prescription but will order esophagram ASAP.  Hi Tanya,   Can you please schedule an esophagram with gastrografin ASAP (if possible tomorrow)? Dx: dysphagia, post endoscopy.  Thanks,  Samantha Cress, MD [facility]

## 2023-10-07 NOTE — Telephone Encounter (Signed)
 Sierra from [facility] called asking for clarification on a prescription. [phone]

## 2023-10-07 NOTE — Telephone Encounter (Signed)
 I spoke with [place] at [facility] she wanted to know how much of each ingredient the mouth wash should contain. I told her that they should have that information, we would not know what % of each ingredient was in the mouth wash. I asked if they had [facility] magic mouth wash she says they do I advised to fill this.

## 2023-10-08 ENCOUNTER — Encounter (INDEPENDENT_AMBULATORY_CARE_PROVIDER_SITE_OTHER): Payer: Self-pay | Admitting: Gastroenterology

## 2023-10-08 ENCOUNTER — Ambulatory Visit (HOSPITAL_COMMUNITY)
Admission: RE | Admit: 2023-10-08 | Discharge: 2023-10-08 | Disposition: A | Discharge status: Home / Self Care | Source: Ambulatory Visit | Attending: Gastroenterology | Admitting: Gastroenterology

## 2023-10-08 DIAGNOSIS — R1310 Dysphagia, unspecified: Secondary | ICD-10-CM | POA: Diagnosis not present

## 2023-10-08 DIAGNOSIS — R1319 Other dysphagia: Secondary | ICD-10-CM | POA: Insufficient documentation

## 2023-10-11 ENCOUNTER — Encounter (INDEPENDENT_AMBULATORY_CARE_PROVIDER_SITE_OTHER): Payer: Self-pay | Admitting: Gastroenterology

## 2023-10-13 NOTE — Telephone Encounter (Signed)
 Duplicate. LS

## 2023-10-20 ENCOUNTER — Ambulatory Visit: Admitting: Podiatrist

## 2023-10-20 ENCOUNTER — Encounter: Payer: Self-pay | Admitting: Podiatrist

## 2023-10-20 DIAGNOSIS — M778 Other enthesopathies, not elsewhere classified: Secondary | ICD-10-CM | POA: Diagnosis not present

## 2023-10-20 DIAGNOSIS — M722 Plantar fascial fibromatosis: Secondary | ICD-10-CM | POA: Diagnosis not present

## 2023-10-20 MED ORDER — METHYLPREDNISOLONE 4 MG PO TBPK: ORAL_TABLET | ORAL | 1 refills | Status: DC

## 2023-10-20 NOTE — Patient Instructions (Addendum)
 Exercises for Plantar Fasciitis Foot and leg exercises can help if you have plantar fasciitis. Only do the exercises you were told to do. Make sure you know how to do the exercises safely. Follow the steps below. It's normal to feel mild discomfort. Stop if you feel pain or your pain gets worse. Do not start these exercises until told by your health care provider. Stretching and range-of-motion exercises These exercises warm up your muscles and joints. They also help with movement and flexibility of your foot. They can help with pain. Plantar fascia stretch This exercise will stretch your plantar fascia, which is a band of thick tissue on the bottom of your foot. Sit with your left / right leg crossed over your other knee. Hold your heel with one hand with that thumb near your arch. With your other hand, hold your toes. Gently pull your toes back toward the top of your foot. You should feel a stretch on the bottom of your toes, on the bottom of your foot, or both. Hold this stretch for ____30______ seconds. Slowly let go of your toes. Go back to the starting position. Repeat ____10______ times. Do this exercise ___3_______ times a day. Gastroc stretch, standing This exercise is called an upper calf, or gastroc, stretch. It stretches the muscles in the back of your upper calf. Stand with your hands against a wall. Extend your left / right leg behind you. Bend your front knee just a little. Keep your heels on the floor, your toes facing forward, and your back knee straight. Shift your weight toward the wall. Do not arch your back. You should feel a gentle stretch in your upper calf. Hold this position for __________ seconds. Repeat __________ times. Do this exercise __________ times a day. Soleus stretch, standing This exercise is called a lower calf, or soleus, stretch. It stretches the muscles in the back of your lower calf. Stand with your hands against a wall. Extend your left / right leg  behind you, and bend your front knee slightly. Keep your heels on the floor and your toes facing forward. Bend your back knee and shift your weight slightly over your back leg. You should feel a gentle stretch deep in your lower calf. Hold this position for __________ seconds. Repeat __________ times. Do this exercise __________ times a day. Gastroc and soleus stretch, standing step This exercise stretches the muscles in the back of your lower leg. This includes your gastroc and soleus muscles. Stand with the ball of your left / right foot on the front of a step. The ball of your foot is on the walking surface, right under your toes. Keep your other foot firmly on the same step. Hold on to the wall or a railing for balance. Slowly lift your other foot, letting your body weight press your heel down over the edge of the front of the step. Keep your knee straight and unbent. You should feel a stretch in your calf. Hold this position for __________ seconds. Return both feet to the step. Repeat this exercise with a slight bend in your left / right knee. Repeat __________ times with your left / right knee straight and __________ times with your left / right knee bent. Do this exercise __________ times a day. Balance exercise This exercise builds your balance and strength control of your arch. It helps take pressure off your plantar fascia. Single leg stand If this exercise is too easy, you can try it with your eyes closed  or while standing on a pillow. Without shoes, stand near a railing or in a doorway. You may hold on to the railing or doorway as needed. Stand on your left / right foot. Keep your big toe down on the floor. Lift the arch of your foot. You should feel a stretch across the bottom of your foot and arch. Do not let your foot roll inward. Hold this position for __________ seconds. Repeat __________ times. Do this exercise __________ times a day. This information is not intended to  replace advice given to you by your health care provider. Make sure you discuss any questions you have with your health care provider. Document Revised: 11/09/2022 Document Reviewed: 11/09/2022 Elsevier Patient Education  2024 [employer].

## 2023-10-20 NOTE — Progress Notes (Signed)
 Boot, steroid pack, return in a month.  Maybe pt Chief Complaint  Patient presents with   Foot Pain    Follow up injection right foot. No improvement. Tylenol  no help. 9 pain walking. Non diabetic.     HPI: Patient is 32 y.o. female who presents today for continued heel pain right. She relates no improvement with the injection.  Relates pain in the heel when standing.  Relates she takes tylenol  for pain.  Any pressure on the heel is uncomfortable.    Allergies  Allergen Reactions   Ferrous Sulfate  Hives    Per patient she broke out in hives with Ferrous Sulfate  solution/ liquid form.    Percocet [Oxycodone -Acetaminophen ] Hives and Itching   Prilosec [Omeprazole ] Other (See Comments)    Stomach cramp   Amoxicillin  Rash    Has patient had a PCN reaction causing immediate rash, facial/tongue/throat swelling, SOB or lightheadedness with hypotension: Yes -mild rash Has patient had a PCN reaction causing severe rash involving mucus membranes or skin necrosis: No Has patient had a PCN reaction that required hospitalization: No Has patient had a PCN reaction occurring within the last 10 years: Yes If all of the above answers are "NO", then may proceed with Cephalosporin use. Has tolerated ceftriaxone  & cephalexin      Review of systems is negative except as noted in the HPI.  Denies nausea/ vomiting/ fevers/ chills or night sweats.   Denies difficulty breathing, denies calf pain or tenderness  Physical Exam  Patient is awake, alert, and oriented x 3.  In no acute distress.    Vascular status is intact with palpable pedal pulses DP and PT bilateral and capillary refill time less than 3 seconds bilateral.  No edema or erythema noted.   Neurological exam reveals epicritic and protective sensation grossly intact bilateral.   Dermatological exam reveals skin is supple and dry to bilateral feet.  No open lesions present.    Musculoskeletal exam: pain - medial heel noted at the calcaneus.   Plantar fascia insertion and medial arch is tolerable with palpation.    Assessment: .Plantar fasciitis of right foot  Plan: Recommended immobilization in a boot.  Boot dispensed and instructions for use given.  Rx for medrol  dose pack given.  She can't swallow pills so will crush the pills to take.  She will return in 4 weeks for follow up -  call sooner if questions arise. Discussed PT.  As well

## 2023-10-26 ENCOUNTER — Encounter: Payer: Self-pay | Admitting: *Deleted

## 2023-10-26 NOTE — Telephone Encounter (Signed)
 SPOKE WITH PATHOLOGY LAST WEEK AND THE AFIRMA WAS NOT ADDED TO THIS SPECIMEN AFTER ALL.

## 2023-10-27 NOTE — Telephone Encounter (Signed)
 Biopsy was inconclusive- not enough cells. Please place referral to endo for further evaluation.

## 2023-10-27 NOTE — Addendum Note (Signed)
 Addended by: Ruta Capece G on: 10/27/2023 12:44 PM   Modules accepted: Orders

## 2023-10-27 NOTE — Telephone Encounter (Signed)
 Pt aware of provider feedback and referral placed.

## 2023-11-02 ENCOUNTER — Ambulatory Visit

## 2023-11-03 ENCOUNTER — Ambulatory Visit (INDEPENDENT_AMBULATORY_CARE_PROVIDER_SITE_OTHER)

## 2023-11-03 DIAGNOSIS — Z23 Encounter for immunization: Secondary | ICD-10-CM

## 2023-11-03 NOTE — Progress Notes (Signed)
 Patient is in office today for a nurse visit for HPV Immunization. Patient Injection was given in the  Right deltoid. Patient tolerated injection well.

## 2024-02-03 ENCOUNTER — Ambulatory Visit: Admitting: Family Medicine

## 2024-02-03 ENCOUNTER — Encounter: Payer: Self-pay | Admitting: Family Medicine

## 2024-02-03 VITALS — BP 143/93 | HR 73 | Temp 97.9°F | Ht 61.0 in | Wt 218.2 lb

## 2024-02-03 DIAGNOSIS — N760 Acute vaginitis: Secondary | ICD-10-CM

## 2024-02-03 DIAGNOSIS — R030 Elevated blood-pressure reading, without diagnosis of hypertension: Secondary | ICD-10-CM | POA: Diagnosis not present

## 2024-02-03 DIAGNOSIS — H66001 Acute suppurative otitis media without spontaneous rupture of ear drum, right ear: Secondary | ICD-10-CM | POA: Diagnosis not present

## 2024-02-03 DIAGNOSIS — J0100 Acute maxillary sinusitis, unspecified: Secondary | ICD-10-CM

## 2024-02-03 MED ORDER — FLUTICASONE PROPIONATE 50 MCG/ACT NA SUSP
2.0000 | Freq: Every day | NASAL | 6 refills | Status: AC
Start: 2024-02-03 — End: ?

## 2024-02-03 MED ORDER — DOXYCYCLINE MONOHYDRATE 25 MG/5ML PO SUSR: 100.0000 mg | Freq: Two times a day (BID) | ORAL | 0 refills | Status: AC

## 2024-02-03 NOTE — Progress Notes (Signed)
 Subjective:  Patient ID: Mckenzie Cooley, female    DOB: 11/14/1990, 33 y.o.   MRN: [id number]  Patient Care Team: Joesph Annabella HERO, FNP as PCP - General (Family Medicine)   Chief Complaint:  Ear Pain (Right/), Nasal Congestion, Headache, and Sore Throat (X 2 weeks- otc benadryl plus)   HPI: Mckenzie Cooley is a 33 y.o. female presenting on 02/03/2024 for Ear Pain (Right/), Nasal Congestion, Headache, and Sore Throat (X 2 weeks- otc benadryl plus)   Mckenzie Cooley is a 33 year old female who presents with persistent sinus infection symptoms for nearly two weeks.  She has been experiencing facial congestion, throat drainage, sore throat, ear pain, and headache, all localized to the head, for almost two weeks. These symptoms have delayed a scheduled root canal procedure. Despite using over-the-counter medications, there has been no improvement in her condition.  She describes the tooth pain as 'horrible' and has been alternating between Tylenol, Motrin, and ibuprofen for relief. No fevers have been noted.  She has been using Benadryl Plus for sinus relief but reports it has not been effective. Additionally, she uses Nuvessa, which produces a yellowish discharge. She mentions significant pain in her ear and throat, particularly on the left side.  She has a history of esophageal dysphagia, which requires her medications to be in liquid form or crushable tablets.          Relevant past medical, surgical, family, and social history reviewed and updated as indicated.  Allergies and medications reviewed and updated. Data reviewed: Chart in Epic.   Past Medical History:  Diagnosis Date   Acid reflux    Anemia    Bloating    Childhood asthma    Eczema    Esophageal dysmotility    History of blood transfusion    Positive urine pregnancy test 12/14/2018   Preeclampsia    Seasonal allergies     Past Surgical History:  Procedure Laterality Date   BIOPSY  02/26/2021   Procedure:  BIOPSY;  Surgeon: Golda Claudis PENNER, MD;  Location: [facility];  Service: Endoscopy;;  Esophageal biopsy   carpal tunnel Left 01/30/2021   CARPAL TUNNEL RELEASE Right 03/27/2021   CESAREAN SECTION N/A 01/02/2017   Procedure: CESAREAN SECTION;  Surgeon: Lorence Ozell CROME, MD;  Location: [facility];  Service: Obstetrics;  Laterality: N/A;   CHOLECYSTECTOMY  2011   Dr.Byerly   COLONOSCOPY WITH PROPOFOL N/A 05/21/2022   Procedure: COLONOSCOPY WITH PROPOFOL;  Surgeon: Eartha Angelia Sieving, MD;  Location: [facility];  Service: Gastroenterology;  Laterality: N/A;  2:00pm, asa 2   ESOPHAGEAL DILATION N/A 02/26/2021   Procedure: ESOPHAGEAL DILATION;  Surgeon: Golda Claudis PENNER, MD;  Location: [facility];  Service: Endoscopy;  Laterality: N/A;   ESOPHAGEAL DILATION N/A 08/19/2022   Procedure: ESOPHAGEAL DILATION;  Surgeon: Eartha Angelia Sieving, MD;  Location: [facility];  Service: Gastroenterology;  Laterality: N/A;   ESOPHAGOGASTRODUODENOSCOPY N/A 10/26/2014   Procedure: ESOPHAGOGASTRODUODENOSCOPY (EGD);  Surgeon: Claudis PENNER Golda, MD;  Location: [facility];  Service: Endoscopy;  Laterality: N/A;   ESOPHAGOGASTRODUODENOSCOPY N/A 10/06/2023   Procedure: EGD (ESOPHAGOGASTRODUODENOSCOPY);  Surgeon: Eartha Angelia, Sieving, MD;  Location: [facility];  Service: Gastroenterology;  Laterality: N/A;  10:30AM;ASA 1   ESOPHAGOGASTRODUODENOSCOPY (EGD) WITH PROPOFOL N/A 02/26/2021   Procedure: ESOPHAGOGASTRODUODENOSCOPY (EGD) WITH PROPOFOL;  Surgeon: Golda Claudis PENNER, MD;  Location: [facility];  Service: Endoscopy;  Laterality: N/A;   ESOPHAGOGASTRODUODENOSCOPY (EGD) WITH PROPOFOL  N/A 08/19/2022   Procedure: ESOPHAGOGASTRODUODENOSCOPY (EGD) WITH PROPOFOL ;  Surgeon: Eartha Angelia Sieving, MD;  Location: [facility];  Service: Gastroenterology;  Laterality: N/A;  200pm, asa 2   POLYPECTOMY  02/26/2021   Procedure: POLYPECTOMY;  Surgeon: Golda Claudis PENNER, MD;  Location: [facility];  Service: Endoscopy;;  Gastric Body Polyps x 3    TYMPANOSTOMY TUBE PLACEMENT     UPPER GASTROINTESTINAL ENDOSCOPY  12/05/2009   UPPER GASTROINTESTINAL ENDOSCOPY  02/07/2009   WISDOM TOOTH EXTRACTION     WISDOM TOOTH EXTRACTION      Social History   Socioeconomic History   Marital status: Married    Spouse name: Not on file   Number of children: 1   Years of education: Not on file   Highest education level: GED or equivalent  Occupational History   Not on file  Tobacco Use   Smoking status: Former    Current packs/day: 0.00    Average packs/day: 0.5 packs/day for 12.0 years (6.0 ttl pk-yrs)    Types: Cigarettes    Start date: 05/05/2004    Quit date: 05/05/2016    Years since quitting: 7.7    Passive exposure: Past   Smokeless tobacco: Never  Vaping Use   Vaping status: Never Used  Substance and Sexual Activity   Alcohol use: No    Alcohol/week: 0.0 standard drinks of alcohol   Drug use: No   Sexual activity: Yes    Birth control/protection: Condom  Other Topics Concern   Not on file  Social History Narrative   Not on file   Social Drivers of Health   Financial Resource Strain: Low Risk  (08/23/2023)   Overall Financial Resource Strain (CARDIA)    Difficulty of Paying Living Expenses: Not very hard  Food Insecurity: No Food Insecurity (08/23/2023)   Hunger Vital Sign    Worried About Running Out of Food in the Last Year: Never true    Ran Out of Food in the Last Year: Never true  Transportation Needs: No Transportation Needs (08/23/2023)   PRAPARE - Administrator, Civil Service (Medical): No    Lack of Transportation (Non-Medical): No  Physical Activity: Sufficiently Active (08/23/2023)   Exercise Vital Sign    Days of Exercise per Week: 5 days    Minutes of Exercise per Session: 120 min  Stress: No Stress Concern Present (08/23/2023)   [employer] of Occupational Health - Occupational Stress Questionnaire    Feeling of Stress : Only a little   Social Connections: Moderately Isolated (08/23/2023)   Social Connection and Isolation Panel    Frequency of Communication with Friends and Family: More than three times a week    Frequency of Social Gatherings with Friends and Family: Twice a week    Attends Religious Services: Never    Database administrator or Organizations: No    Attends Engineer, structural: Not on file    Marital Status: Married  Catering manager Violence: Not At Risk (02/06/2021)   Humiliation, Afraid, Rape, and Kick questionnaire    Fear of Current or Ex-Partner: No    Emotionally Abused: No    Physically Abused: No    Sexually Abused: No    Outpatient Encounter Medications as of 02/03/2024  Medication Sig   acetaminophen  (TYLENOL ) 500 MG tablet Take 1,000 mg by mouth every 8 (eight) hours as needed for moderate pain. TAKES IN LIQUID FORM   doxycycline  (VIBRAMYCIN ) 25 MG/5ML SUSR Take 20 mLs (  100 mg total) by mouth 2 (two) times daily for 10 days.   esomeprazole  (NEXIUM ) 40 MG capsule Take 1 capsule (40 mg total) by mouth daily before breakfast.   famotidine  (PEPCID ) 40 MG tablet Take 40 mg by mouth daily.   fluticasone  (FLONASE ) 50 MCG/ACT nasal spray Place 2 sprays into both nostrils daily.   linaclotide  (LINZESS ) 72 MCG capsule Take 1 capsule (72 mcg total) by mouth daily before breakfast.   [DISCONTINUED] magic mouthwash (nystatin , lidocaine , diphenhydrAMINE , alum & mag hydroxide) suspension Swish and swallow 5 mLs 3 (three) times daily.   [DISCONTINUED] methylPREDNISolone  (MEDROL  DOSEPAK) 4 MG TBPK tablet Take taper as directed in package instructions   No facility-administered encounter medications on file as of 02/03/2024.    Allergies  Allergen Reactions   Ferrous Sulfate  Hives    Per patient she broke out in hives with Ferrous Sulfate  solution/ liquid form.    Percocet [Oxycodone -Acetaminophen ] Hives and Itching   Prilosec [Omeprazole ] Other (See Comments)    Stomach cramp   Amoxicillin   Rash    Has patient had a PCN reaction causing immediate rash, facial/tongue/throat swelling, SOB or lightheadedness with hypotension: Yes -mild rash Has patient had a PCN reaction causing severe rash involving mucus membranes or skin necrosis: No Has patient had a PCN reaction that required hospitalization: No Has patient had a PCN reaction occurring within the last 10 years: Yes If all of the above answers are NO, then may proceed with Cephalosporin use. Has tolerated ceftriaxone  & cephalexin      Pertinent ROS per HPI, otherwise unremarkable      Objective:  BP (!) 143/93   Pulse 73   Temp 97.9 F (36.6 C)   Ht 5' 1 (1.549 m)   Wt 218 lb 3.2 oz (99 kg)   SpO2 98%   BMI 41.23 kg/m    Wt Readings from Last 3 Encounters:  02/03/24 218 lb 3.2 oz (99 kg)  10/06/23 216 lb (98 kg)  09/20/23 220 lb 12.8 oz (100.2 kg)    Physical Exam Vitals and nursing note reviewed.  Constitutional:      General: She is not in acute distress.    Appearance: Normal appearance. She is well-developed. She is obese. She is not ill-appearing, toxic-appearing or diaphoretic.  HENT:     Head: Normocephalic and atraumatic.     Right Ear: Tympanic membrane is erythematous and bulging. Tympanic membrane is not perforated.     Left Ear: Tympanic membrane is not erythematous or bulging.     Nose: Congestion present.     Right Turbinates: Enlarged.     Left Turbinates: Enlarged.     Right Sinus: Maxillary sinus tenderness present. No frontal sinus tenderness.     Left Sinus: Maxillary sinus tenderness present. No frontal sinus tenderness.     Mouth/Throat:     Lips: Pink.     Mouth: Mucous membranes are moist.     Pharynx: Postnasal drip present. No oropharyngeal exudate or posterior oropharyngeal erythema.  Eyes:     Conjunctiva/sclera: Conjunctivae normal.     Pupils: Pupils are equal, round, and reactive to light.  Cardiovascular:     Rate and Rhythm: Normal rate and regular rhythm.     Heart  sounds: Normal heart sounds.  Pulmonary:     Effort: Pulmonary effort is normal.     Breath sounds: Normal breath sounds.  Musculoskeletal:     Right lower leg: No edema.     Left lower leg: No edema.  Skin:    General: Skin is warm and dry.     Capillary Refill: Capillary refill takes less than 2 seconds.  Neurological:     General: No focal deficit present.     Mental Status: She is alert and oriented to person, place, and time.  Psychiatric:        Mood and Affect: Mood normal.        Behavior: Behavior normal. Behavior is cooperative.        Thought Content: Thought content normal.        Judgment: Judgment normal.     Results for orders placed or performed during the hospital encounter of 10/04/23  Pregnancy, urine   Collection Time: 10/04/23  8:26 AM  Result Value Ref Range   Preg Test, Ur NEGATIVE NEGATIVE       Pertinent labs & imaging results that were available during my care of the patient were reviewed by me and considered in my medical decision making.  Assessment & Plan:  Veronika was seen today for ear pain, nasal congestion, headache and sore throat.  Diagnoses and all orders for this visit:  Acute non-recurrent maxillary sinusitis -     fluticasone (FLONASE) 50 MCG/ACT nasal spray; Place 2 sprays into both nostrils daily. -     doxycycline (VIBRAMYCIN) 25 MG/5ML SUSR; Take 20 mLs (100 mg total) by mouth 2 (two) times daily for 10 days.  Elevated blood pressure reading without diagnosis of hypertension Likely due to over the counter cold and sinus medications.   Non-recurrent acute suppurative otitis media of right ear without spontaneous rupture of tympanic membrane -     doxycycline (VIBRAMYCIN) 25 MG/5ML SUSR; Take 20 mLs (100 mg total) by mouth 2 (two) times daily for 10 days.        Acute maxillary sinusitis Acute maxillary sinusitis with symptoms persisting for almost two weeks, including facial congestion, throat drainage, sore throat, ear pain,  and headache. Likely related to a dental issue requiring a root canal. Examination reveals infection in the left maxillary sinus and right ear. No fever reported. - Prescribe doxycycline 100 mg liquid form, twice daily for 10 days due to allergy to amoxicillin and esophageal dysphagia - Prescribe Flonase, one spray in each nostril every morning - Advise to avoid over-the-counter decongestants - Encourage increased water intake  Elevated blood pressure Elevated blood pressure likely secondary to over-the-counter cold medications containing stimulants. - Monitor blood pressure two to three times a week at random times - If blood pressure remains elevated, schedule an appointment with Tiffany for further evaluation          Continue all other maintenance medications.  Follow up plan: Return if symptoms worsen or fail to improve.   Continue healthy lifestyle choices, including diet (rich in fruits, vegetables, and lean proteins, and low in salt and simple carbohydrates) and exercise (at least 30 minutes of moderate physical activity daily).  Educational handout given for sinus infection, DASH diet  The above assessment and management plan was discussed with the patient. The patient verbalized understanding of and has agreed to the management plan. Patient is aware to call the clinic if they develop any new symptoms or if symptoms persist or worsen. Patient is aware when to return to the clinic for a follow-up visit. Patient educated on when it is appropriate to go to the emergency department.   Rosaline Bruns, FNP-C [facility] [phone]

## 2024-02-03 NOTE — Patient Instructions (Signed)

## 2024-02-18 MED ORDER — FLUCONAZOLE 150 MG PO TABS: 150.0000 mg | ORAL_TABLET | Freq: Once | ORAL | 0 refills | Status: AC

## 2024-02-18 NOTE — Addendum Note (Signed)
 Addended by: SEVERA ROCK HERO on: 02/18/2024 04:02 PM   Modules accepted: Orders

## 2024-02-24 ENCOUNTER — Ambulatory Visit: Payer: Self-pay

## 2024-02-24 NOTE — Telephone Encounter (Signed)
Advised going to ER

## 2024-02-24 NOTE — Telephone Encounter (Signed)
 FYI Only or Action Required?: FYI only for provider.  Patient was last seen in primary care on 02/03/2024 by Severa Rock HERO, FNP.  Called Nurse Triage reporting Hip Pain.  Symptoms began several years ago.  Interventions attempted: Nothing.  Symptoms are: rapidly worsening.  Triage Disposition: Go to ED Now (Notify PCP)  Patient/caregiver understands and will follow disposition?: Yes     Copied from CRM [id number]. Topic: Clinical - Red Word Triage >> Feb 24, 2024  8:31 AM Willma R wrote: [facility] that prompted transfer to Nurse Triage: Patient has had pain in her right hip for some time and it has been getting increasing worse. Has pain when she walks. Reason for Disposition  [1] SEVERE pain (e.g., excruciating, unable to do any normal activities) AND [2] fever  Answer Assessment - Initial Assessment Questions 1. LOCATION and RADIATION: Where is the pain located? Does the pain spread (shoot) anywhere else?     Right hip pain 2. QUALITY: What does the pain feel like?  (e.g., sharp, dull, aching, burning)     aches 3. SEVERITY: How bad is the pain? What does it keep you from doing?   (Scale 1-10; or mild, moderate, severe)     Constant mild to severe 4. ONSET: When did the pain start? Does it come and go, or is it there all the time?     2 years ago 5. WORK OR EXERCISE: Has there been any recent work or exercise that involved this part of the body?      denies 6. CAUSE: What do you think is causing the hip pain?      States swollen and has a knot in hip 7. AGGRAVATING FACTORS: What makes the hip pain worse? (e.g., walking, climbing stairs, running)     Walking states hx ddd and has done physical therapy. 8. OTHER SYMPTOMS: Do you have any other symptoms? (e.g., back pain, pain shooting down leg,  fever, rash)     Back pain  Protocols used: Hip Pain-A-AH

## 2024-03-06 ENCOUNTER — Ambulatory Visit

## 2024-03-09 ENCOUNTER — Ambulatory Visit: Admitting: Family Medicine

## 2024-03-13 ENCOUNTER — Ambulatory Visit (INDEPENDENT_AMBULATORY_CARE_PROVIDER_SITE_OTHER)

## 2024-03-13 ENCOUNTER — Encounter: Payer: Self-pay | Admitting: Family Medicine

## 2024-03-13 ENCOUNTER — Ambulatory Visit: Admitting: Family Medicine

## 2024-03-13 VITALS — BP 116/76 | HR 64 | Temp 99.0°F | Ht 61.0 in | Wt 215.0 lb

## 2024-03-13 DIAGNOSIS — R232 Flushing: Secondary | ICD-10-CM

## 2024-03-13 DIAGNOSIS — R1319 Other dysphagia: Secondary | ICD-10-CM

## 2024-03-13 DIAGNOSIS — G8929 Other chronic pain: Secondary | ICD-10-CM

## 2024-03-13 DIAGNOSIS — R61 Generalized hyperhidrosis: Secondary | ICD-10-CM

## 2024-03-13 DIAGNOSIS — J0140 Acute pansinusitis, unspecified: Secondary | ICD-10-CM | POA: Diagnosis not present

## 2024-03-13 DIAGNOSIS — M25551 Pain in right hip: Secondary | ICD-10-CM | POA: Diagnosis not present

## 2024-03-13 DIAGNOSIS — Z23 Encounter for immunization: Secondary | ICD-10-CM | POA: Diagnosis not present

## 2024-03-13 DIAGNOSIS — R5383 Other fatigue: Secondary | ICD-10-CM

## 2024-03-13 MED ORDER — CEFDINIR 250 MG/5ML PO SUSR
300.0000 mg | Freq: Two times a day (BID) | ORAL | 0 refills | Status: AC
Start: 2024-03-13 — End: 2024-03-23

## 2024-03-13 NOTE — Progress Notes (Unsigned)
 [id number]   Acute Office Visit  Subjective:     Patient ID: Mckenzie Cooley, female    DOB: 07-25-1990, 33 y.o.   MRN: [id number]  Chief Complaint  Patient presents with   right hip pain    X 2 years, getting progressively Needs final HPV vaccination    HPI  History of Present Illness   Mckenzie Cooley is a 33 year old female who presents with persistent right hip pain.  Right hip pain - Persistent right hip pain for two years, progressively worsening over the last few months - Pain is intense and localized to the joint area - Pain is exacerbated by walking - Pain prevents raising the leg, sitting, or lying on the affected hip - No numbness or tingling - Occasional itching along the side of the leg - No prior medical evaluation or diagnostic imaging  Sinonasal and otologic symptoms - Sinus infection and ear pain persisting for approximately one month - Continued sinus congestion, ear itching, and headaches - White spots observed in the ear by her husband  Genitourinary and infectious symptoms - Urinary tract infection developed after taking doxycycline  for a dental infection, leading to discontinuation of the antibiotic - Single dose of fluconazole  resolved the urinary tract infection - Continued fatigue and diminished libido  Menstrual irregularity and vasomotor symptoms - Night sweats and hot flashes for the past one to two months - Wakes up drenched in sweat and feels unable to get up for a few minutes - Irregular menstrual cycle: no period in August, menses in September - Not using birth control since last child - Mother experienced early menopause  Dermatologic reaction to antibiotics - History of mild rash with amoxicillin  in early twenties, described as red, itchy dots in a few places  Constitutional and appetite changes - No nausea - Excessive sweating during the day - Increased appetite over the last two days       ROS As per HPI.      Objective:     BP 116/76   Pulse 64   Temp 99 F (37.2 C)   Ht 5' 1 (1.549 m)   Wt 215 lb (97.5 kg)   SpO2 98%   BMI 40.62 kg/m  {Vitals History (Optional):23777}  Physical Exam  ***  No results found for any visits on 03/13/24.      Assessment & Plan:   [name] was seen today for right hip pain.  Diagnoses and all orders for this visit:  Chronic right hip pain -     DG HIP UNILAT W OR W/O PELVIS 2-3 VIEWS RIGHT; Future  Acute non-recurrent pansinusitis -     cefdinir  (OMNICEF ) 250 MG/5ML suspension; Take 6 mLs (300 mg total) by mouth 2 (two) times daily for 10 days.  Esophageal dysphagia -     cefdinir  (OMNICEF ) 250 MG/5ML suspension; Take 6 mLs (300 mg total) by mouth 2 (two) times daily for 10 days.  Other fatigue -     CMP14+EGFR -     Anemia Profile B -     Thyroid  Panel With TSH -     Thyroid  Peroxidase Antibodies (TPO) (REFL)-Quest -     QuantiFERON-TB Gold Plus -     Anti-TPO Ab (RDL)  Night sweats -     CMP14+EGFR -     Anemia Profile B -     Thyroid  Panel With TSH -     Thyroid  Peroxidase Antibodies (TPO) (REFL)-Quest -     QuantiFERON-TB  Gold Plus -     QuantiFERON-TB Gold Plus -     Anti-TPO Ab (RDL)  Hot flashes -     Anti-TPO Ab (RDL)  Encounter for administration of vaccine -     HPV 9-valent vaccine,Recombinat   Assessment and Plan    Right hip pain Chronic right hip pain with swelling and palpable knot, exacerbated by movement. No prior imaging or specialist evaluation. - Order right hip x-ray. - Refer to orthopedic specialist.  Acute sinusitis Persistent sinus symptoms with ear pain, congestion, and headaches. Previous doxycycline  discontinued due to adverse effects. Omnicef  chosen due to mild rash history with amoxicillin . - Prescribe Omnicef  (cefdinir ) 300 mg, 6 mL twice a day for 10 days in liquid form.  Night sweats and hot flashes, evaluation in progress Night sweats and hot flashes possibly due to infection, early menopause, or thyroid   dysfunction. - Order blood work including thyroid  function tests, iron, B12, folate, kidney and liver function tests. - Order tuberculosis screening.  Fatigue, evaluation in progress Persistent fatigue possibly related to recent antibiotic use and sinus infection. - Order blood work including thyroid  function tests, iron, B12, folate, kidney and liver function tests.  Adverse reaction to amoxicillin  History of mild rash with amoxicillin , alternative antibiotics considered. - Prescribe Omnicef  (cefdinir ) as an alternative to amoxicillin .  General Health Maintenance Due for HPV vaccination and routine physical examination in March. - Administer HPV vaccine today.       No orders of the defined types were placed in this encounter.   No follow-ups on file.  Annabella CHRISTELLA Search, FNP

## 2024-03-14 LAB — ANEMIA PROFILE B
Basophils Absolute: 0.1 x10E3/uL (ref 0.0–0.2)
Basos: 1 %
EOS (ABSOLUTE): 0.1 x10E3/uL (ref 0.0–0.4)
Eos: 1 %
Ferritin: 68 ng/mL (ref 15–150)
Folate: 6.7 ng/mL (ref >3.0)
Hematocrit: 43.2 % (ref 34.0–46.6)
Hemoglobin: 14.3 g/dL (ref 11.1–15.9)
Immature Grans (Abs): 0 x10E3/uL (ref 0.0–0.1)
Immature Granulocytes: 0 %
Iron Saturation: 15 % (ref 15–55)
Iron: 47 ug/dL (ref 27–159)
Lymphocytes Absolute: 2 x10E3/uL (ref 0.7–3.1)
Lymphs: 26 %
MCH: 28.4 pg (ref 26.6–33.0)
MCHC: 33.1 g/dL (ref 31.5–35.7)
MCV: 86 fL (ref 79–97)
Monocytes Absolute: 0.7 x10E3/uL (ref 0.1–0.9)
Monocytes: 9 %
Neutrophils Absolute: 5 x10E3/uL (ref 1.4–7.0)
Neutrophils: 63 %
Platelets: 336 x10E3/uL (ref 150–450)
RBC: 5.03 x10E6/uL (ref 3.77–5.28)
RDW: 13 % (ref 11.7–15.4)
Retic Ct Pct: 1.5 % (ref 0.6–2.6)
Total Iron Binding Capacity: 309 ug/dL (ref 250–450)
UIBC: 262 ug/dL (ref 131–425)
Vitamin B-12: 333 pg/mL (ref 232–1245)
WBC: 7.9 x10E3/uL (ref 3.4–10.8)

## 2024-03-14 LAB — CMP14+EGFR
ALT: 23 IU/L (ref 0–32)
AST: 19 IU/L (ref 0–40)
Albumin: 4.3 g/dL (ref 3.9–4.9)
Alkaline Phosphatase: 70 IU/L (ref 41–116)
BUN/Creatinine Ratio: 10 (ref 9–23)
BUN: 9 mg/dL (ref 6–20)
Bilirubin Total: 0.4 mg/dL (ref 0.0–1.2)
CO2: 22 mmol/L (ref 20–29)
Calcium: 9.4 mg/dL (ref 8.7–10.2)
Chloride: 103 mmol/L (ref 96–106)
Creatinine, Ser: 0.94 mg/dL (ref 0.57–1.00)
Globulin, Total: 2.7 g/dL (ref 1.5–4.5)
Glucose: 93 mg/dL (ref 70–99)
Potassium: 4.6 mmol/L (ref 3.5–5.2)
Sodium: 139 mmol/L (ref 134–144)
Total Protein: 7 g/dL (ref 6.0–8.5)
eGFR: 82 mL/min/1.73 (ref >59)

## 2024-03-14 LAB — THYROID PANEL WITH TSH
Free Thyroxine Index: 1.8 (ref 1.2–4.9)
T3 Uptake Ratio: 25 % (ref 24–39)
T4, Total: 7 ug/dL (ref 4.5–12.0)
TSH: 1.17 u[IU]/mL (ref 0.450–4.500)

## 2024-03-15 ENCOUNTER — Encounter: Payer: Self-pay | Admitting: Family Medicine

## 2024-03-16 ENCOUNTER — Ambulatory Visit: Payer: Self-pay | Admitting: Family Medicine

## 2024-03-16 DIAGNOSIS — G8929 Other chronic pain: Secondary | ICD-10-CM

## 2024-03-20 ENCOUNTER — Ambulatory Visit: Admitting: Obstetrics & Gynecology

## 2024-03-20 ENCOUNTER — Encounter: Payer: Self-pay | Admitting: Obstetrics & Gynecology

## 2024-03-20 ENCOUNTER — Other Ambulatory Visit (HOSPITAL_COMMUNITY)
Admission: RE | Admit: 2024-03-20 | Discharge: 2024-03-20 | Disposition: A | Discharge status: Home / Self Care | Source: Ambulatory Visit | Attending: Obstetrics & Gynecology | Admitting: Obstetrics & Gynecology

## 2024-03-20 VITALS — BP 132/85 | HR 56 | Ht 60.0 in | Wt 216.0 lb

## 2024-03-20 DIAGNOSIS — Z1151 Encounter for screening for human papillomavirus (HPV): Secondary | ICD-10-CM | POA: Diagnosis not present

## 2024-03-20 DIAGNOSIS — Z01419 Encounter for gynecological examination (general) (routine) without abnormal findings: Secondary | ICD-10-CM | POA: Diagnosis not present

## 2024-03-20 DIAGNOSIS — N814 Uterovaginal prolapse, unspecified: Secondary | ICD-10-CM

## 2024-03-20 DIAGNOSIS — B372 Candidiasis of skin and nail: Secondary | ICD-10-CM | POA: Diagnosis not present

## 2024-03-20 DIAGNOSIS — Z124 Encounter for screening for malignant neoplasm of cervix: Secondary | ICD-10-CM

## 2024-03-20 MED ORDER — NYSTATIN 100000 UNIT/GM EX POWD: CUTANEOUS | 11 refills | Status: AC

## 2024-03-20 NOTE — Progress Notes (Signed)
 Subjective:     Mckenzie Cooley is a 33 y.o. female here for a routine exam.  Patient's last menstrual period was 03/02/2024 (exact date). [id number] Birth Control Method:  none Menstrual Calendar(currently): pretty regular last few months  Current complaints: discomfort with intercourse.     Current acute medical issues:  none   Recent Gynecologic History Patient's last menstrual period was 03/02/2024 (exact date). Last Pap: 01/2021,  normal Last mammogram: na  Past Medical History:  Diagnosis Date   Acid reflux    Anemia    Bloating    Childhood asthma    Eczema    Esophageal dysmotility    History of blood transfusion    Positive urine pregnancy test 12/14/2018   Preeclampsia    Seasonal allergies     Past Surgical History:  Procedure Laterality Date   BIOPSY  02/26/2021   Procedure: BIOPSY;  Surgeon: Golda Claudis PENNER, MD;  Location: [facility];  Service: Endoscopy;;  Esophageal biopsy   carpal tunnel Left 01/30/2021   CARPAL TUNNEL RELEASE Right 03/27/2021   CESAREAN SECTION N/A 01/02/2017   Procedure: CESAREAN SECTION;  Surgeon: Lorence Ozell CROME, MD;  Location: [facility];  Service: Obstetrics;  Laterality: N/A;   CHOLECYSTECTOMY  2011   Dr.Byerly   COLONOSCOPY WITH PROPOFOL  N/A 05/21/2022   Procedure: COLONOSCOPY WITH PROPOFOL ;  Surgeon: Eartha Angelia Sieving, MD;  Location: [facility];  Service: Gastroenterology;  Laterality: N/A;  2:00pm, asa 2   ESOPHAGEAL DILATION N/A 02/26/2021   Procedure: ESOPHAGEAL DILATION;  Surgeon: Golda Claudis PENNER, MD;  Location: [facility];  Service: Endoscopy;  Laterality: N/A;   ESOPHAGEAL DILATION N/A 08/19/2022   Procedure: ESOPHAGEAL DILATION;  Surgeon: Eartha Angelia Sieving, MD;  Location: [facility];  Service: Gastroenterology;  Laterality: N/A;   ESOPHAGOGASTRODUODENOSCOPY N/A 10/26/2014   Procedure: ESOPHAGOGASTRODUODENOSCOPY (EGD);  Surgeon: Claudis PENNER Golda, MD;  Location: [facility];  Service:  Endoscopy;  Laterality: N/A;   ESOPHAGOGASTRODUODENOSCOPY N/A 10/06/2023   Procedure: EGD (ESOPHAGOGASTRODUODENOSCOPY);  Surgeon: Eartha Angelia, Sieving, MD;  Location: [facility];  Service: Gastroenterology;  Laterality: N/A;  10:30AM;ASA 1   ESOPHAGOGASTRODUODENOSCOPY (EGD) WITH PROPOFOL  N/A 02/26/2021   Procedure: ESOPHAGOGASTRODUODENOSCOPY (EGD) WITH PROPOFOL ;  Surgeon: Golda Claudis PENNER, MD;  Location: [facility];  Service: Endoscopy;  Laterality: N/A;   ESOPHAGOGASTRODUODENOSCOPY (EGD) WITH PROPOFOL  N/A 08/19/2022   Procedure: ESOPHAGOGASTRODUODENOSCOPY (EGD) WITH PROPOFOL ;  Surgeon: Eartha Angelia Sieving, MD;  Location: [facility];  Service: Gastroenterology;  Laterality: N/A;  200pm, asa 2   POLYPECTOMY  02/26/2021   Procedure: POLYPECTOMY;  Surgeon: Golda Claudis PENNER, MD;  Location: [facility];  Service: Endoscopy;;  Gastric Body Polyps x 3    TYMPANOSTOMY TUBE PLACEMENT     UPPER GASTROINTESTINAL ENDOSCOPY  12/05/2009   UPPER GASTROINTESTINAL ENDOSCOPY  02/07/2009   WISDOM TOOTH EXTRACTION     WISDOM TOOTH EXTRACTION      OB History     Gravida  5   Para  1   Term  1   Preterm      AB  4   Living  1      SAB  4   IAB  0   Ectopic  0   Multiple  0   Live Births  1           Social History   Socioeconomic History   Marital status: Married    Spouse name: Not on file   Number of  children: 1   Years of education: Not on file   Highest education level: GED or equivalent  Occupational History   Not on file  Tobacco Use   Smoking status: Former    Current packs/day: 0.00    Average packs/day: 0.5 packs/day for 12.0 years (6.0 ttl pk-yrs)    Types: Cigarettes    Start date: 05/05/2004    Quit date: 05/05/2016    Years since quitting: 7.8    Passive exposure: Past   Smokeless tobacco: Never  Vaping Use   Vaping status: Never Used  Substance and Sexual Activity   Alcohol use: No    Alcohol/week: 0.0 standard drinks of alcohol    Drug use: No   Sexual activity: Yes    Birth control/protection: Condom  Other Topics Concern   Not on file  Social History Narrative   Not on file   Social Drivers of Health   Financial Resource Strain: Low Risk  (03/20/2024)   Overall Financial Resource Strain (CARDIA)    Difficulty of Paying Living Expenses: Not hard at all  Food Insecurity: No Food Insecurity (03/20/2024)   Hunger Vital Sign    Worried About Running Out of Food in the Last Year: Never true    Ran Out of Food in the Last Year: Never true  Transportation Needs: No Transportation Needs (03/20/2024)   PRAPARE - Administrator, Civil Service (Medical): No    Lack of Transportation (Non-Medical): No  Physical Activity: Sufficiently Active (03/20/2024)   Exercise Vital Sign    Days of Exercise per Week: 5 days    Minutes of Exercise per Session: 30 min  Stress: No Stress Concern Present (03/20/2024)   [employer] of Occupational Health - Occupational Stress Questionnaire    Feeling of Stress: Not at all  Social Connections: Moderately Isolated (03/20/2024)   Social Connection and Isolation Panel    Frequency of Communication with Friends and Family: More than three times a week    Frequency of Social Gatherings with Friends and Family: Once a week    Attends Religious Services: Never    Database administrator or Organizations: No    Attends Engineer, structural: Never    Marital Status: Married    Family History  Problem Relation Age of Onset   Hypertension Mother    Heart disease Father    Cancer - Cervical Maternal Aunt    Kidney disease Maternal Grandmother    Renal Disease Maternal Grandmother    Eczema Daughter      Current Outpatient Medications:    acetaminophen  (TYLENOL ) 500 MG tablet, Take 1,000 mg by mouth every 8 (eight) hours as needed for moderate pain. TAKES IN LIQUID FORM, Disp: , Rfl:    cefdinir  (OMNICEF ) 250 MG/5ML suspension, Take 6 mLs (300 mg total) by mouth 2  (two) times daily for 10 days., Disp: 120 mL, Rfl: 0   esomeprazole  (NEXIUM ) 40 MG capsule, Take 1 capsule (40 mg total) by mouth daily before breakfast., Disp: 30 capsule, Rfl: 11   famotidine  (PEPCID ) 40 MG tablet, Take 40 mg by mouth daily., Disp: , Rfl:    linaclotide  (LINZESS ) 72 MCG capsule, Take 1 capsule (72 mcg total) by mouth daily before breakfast., Disp: 90 capsule, Rfl: 3   nystatin  (MYCOSTATIN /NYSTOP ) powder, Daily at bedtime, Disp: 15 g, Rfl: 11   fluticasone  (FLONASE ) 50 MCG/ACT nasal spray, Place 2 sprays into both nostrils daily. (Patient not taking: Reported on 03/20/2024), Disp: 16 g,  Rfl: 6  Review of Systems  Review of Systems  Constitutional: Negative for fever, chills, weight loss, malaise/fatigue and diaphoresis.  HENT: Negative for hearing loss, ear pain, nosebleeds, congestion, sore throat, neck pain, tinnitus and ear discharge.   Eyes: Negative for blurred vision, double vision, photophobia, pain, discharge and redness.  Respiratory: Negative for cough, hemoptysis, sputum production, shortness of breath, wheezing and stridor.   Cardiovascular: Negative for chest pain, palpitations, orthopnea, claudication, leg swelling and PND.  Gastrointestinal: negative for abdominal pain. Negative for heartburn, nausea, vomiting, diarrhea, constipation, blood in stool and melena.  Genitourinary: Negative for dysuria, urgency, frequency, hematuria and flank pain.  Musculoskeletal: Negative for myalgias, back pain, joint pain and falls.  Skin: Negative for itching and rash.  Neurological: Negative for dizziness, tingling, tremors, sensory change, speech change, focal weakness, seizures, loss of consciousness, weakness and headaches.  Endo/Heme/Allergies: Negative for environmental allergies and polydipsia. Does not bruise/bleed easily.  Psychiatric/Behavioral: Negative for depression, suicidal ideas, hallucinations, memory loss and substance abuse. The patient is not nervous/anxious  and does not have insomnia.        Objective:  Blood pressure 132/85, pulse (!) 56, height 5' (1.524 m), weight 216 lb (98 kg), last menstrual period 03/02/2024.   Physical Exam  Vitals reviewed. Constitutional: She is oriented to person, place, and time. She appears well-developed and well-nourished.  HENT:  Head: Normocephalic and atraumatic.        Right Ear: External ear normal.  Left Ear: External ear normal.  Nose: Nose normal.  Mouth/Throat: Oropharynx is clear and moist.  Eyes: Conjunctivae and EOM are normal. Pupils are equal, round, and reactive to light. Right eye exhibits no discharge. Left eye exhibits no discharge. No scleral icterus.  Neck: Normal range of motion. Neck supple. No tracheal deviation present. No thyromegaly present.  Cardiovascular: Normal rate, regular rhythm, normal heart sounds and intact distal pulses.  Exam reveals no gallop and no friction rub.   No murmur heard. Respiratory: Effort normal and breath sounds normal. No respiratory distress. She has no wheezes. She has no rales. She exhibits no tenderness.  GI: Soft. Bowel sounds are normal. She exhibits no distension and no mass. There is no tenderness. There is no rebound and no guarding.  Genitourinary:  Breasts no masses skin changes or nipple changes bilaterally      Vulva is normal without lesions Vagina is pink moist without discharge Cervix normal in appearance and pap is done, a bit of descent Grade Uterus is normal size shape and contour Adnexa is negative with normal sized ovaries   Musculoskeletal: Normal range of motion. She exhibits no edema and no tenderness.  Neurological: She is alert and oriented to person, place, and time. She has normal reflexes. She displays normal reflexes. No cranial nerve deficit. She exhibits normal muscle tone. Coordination normal.  Skin: Skin is warm and dry. No rash noted. No erythema. No pallor.  Psychiatric: She has a normal mood and affect. Her behavior  is normal. Judgment and thought content normal.       Medications Ordered at today's visit: Meds ordered this encounter  Medications   nystatin  (MYCOSTATIN /NYSTOP ) powder    Sig: Daily at bedtime    Dispense:  15 g    Refill:  11    Other orders placed at today's visit: No orders of the defined types were placed in this encounter.    ASSESSMENT + PLAN:    ICD-10-CM   1. Well woman exam with routine gynecological  exam  Z01.419     2. Routine Papanicolaou smear  Z12.4 Cytology - PAP    Cervicovaginal ancillary only    3. Uterine prolapse, etiology of bump dyspareunia  N81.4     4. Yeast dermatitis: pannus  B37.2           Return in about 3 years (around 03/21/2027), or if symptoms worsen or fail to improve, for yearly.

## 2024-03-20 NOTE — Addendum Note (Signed)
 Addended by: RANDINE ARNULFO MATSU on: 03/20/2024 12:16 PM   Modules accepted: Orders

## 2024-03-21 LAB — CERVICOVAGINAL ANCILLARY ONLY
Bacterial Vaginitis (gardnerella): NEGATIVE
Candida Glabrata: NEGATIVE
Candida Vaginitis: NEGATIVE
Chlamydia: NEGATIVE
Comment: NEGATIVE
Comment: NEGATIVE
Comment: NEGATIVE
Comment: NEGATIVE
Comment: NEGATIVE
Comment: NORMAL
Neisseria Gonorrhea: NEGATIVE
Trichomonas: NEGATIVE

## 2024-03-22 ENCOUNTER — Ambulatory Visit: Admitting: Family Medicine

## 2024-03-22 LAB — CYTOLOGY - PAP
Comment: NEGATIVE
Diagnosis: NEGATIVE
High risk HPV: NEGATIVE

## 2024-03-27 ENCOUNTER — Encounter: Payer: Self-pay | Admitting: Nurse Practitioner

## 2024-03-27 ENCOUNTER — Ambulatory Visit: Admitting: Nurse Practitioner

## 2024-03-27 VITALS — BP 116/80 | HR 59 | Ht 61.0 in | Wt 218.0 lb

## 2024-03-27 DIAGNOSIS — E041 Nontoxic single thyroid nodule: Secondary | ICD-10-CM | POA: Diagnosis not present

## 2024-03-27 DIAGNOSIS — Z8349 Family history of other endocrine, nutritional and metabolic diseases: Secondary | ICD-10-CM

## 2024-03-27 LAB — QUANTIFERON-TB GOLD PLUS
QuantiFERON Mitogen Value: >10 [IU]/mL
QuantiFERON Nil Value: 0.04 [IU]/mL
QuantiFERON TB1 Ag Value: 0.04 [IU]/mL
QuantiFERON TB2 Ag Value: 0.04 [IU]/mL
QuantiFERON-TB Gold Plus: NEGATIVE

## 2024-03-27 LAB — ANTI-TPO AB (RDL): Anti-TPO Ab (RDL): <9 [IU]/mL (ref <9.0)

## 2024-03-27 NOTE — Patient Instructions (Signed)
 Thyroid Nodule  A thyroid nodule is an isolated growth of thyroid cells that forms a lump in the thyroid gland. The thyroid gland is a butterfly-shaped gland found in the lower front of the neck. It sends chemical messengers (hormones) through the blood to all parts of the body. These hormones are important in regulating body temperature and helping the body use energy. Thyroid nodules are common. Most are not cancerous (are benign). You may have one nodule or several nodules. There are different types of thyroid nodules. They include nodules that: Grow and fill with fluid (thyroid cysts). Produce too much thyroid hormone (hot nodules or hyperthyroid). Produce no thyroid hormone (cold nodules or hypothyroid). Form from cancer cells (thyroid cancers). What are the causes? In most cases, the cause of thyroid nodules is not known. What increases the risk? The following factors may make you more likely to develop thyroid nodules: Age. Thyroid nodules are more common in people who are older than 45 years. Female gender. A family history that includes: Thyroid nodules. Pheochromocytoma. Thyroid carcinoma. Hyperparathyroidism. Certain thyroid diseases, such as Hashimoto's thyroiditis. Lack of iodine in your diet. A history of head and neck radiation, such as from cancer treatments. Type 2 diabetes. What are the signs or symptoms? In many cases, there are no symptoms. If you have symptoms, they may include: A lump in your lower neck. Feeling pressure, fullness, or a tickle in your throat. Pain in your neck, jaw, or ear. Having trouble swallowing or breathing. Hot nodules may cause: Weight loss. Warm, flushed skin. Feeling hot. Feeling nervous. A rapid or irregular heartbeat. Cold nodules may cause: Weight gain. Dry skin. Hair loss, brittle hair, or both. Feeling cold. Fatigue. Thyroid cancer nodules may cause: Hard nodules that can be felt along the thyroid  gland. Hoarseness. Lumps in the tissue (lymph nodes) near your thyroid gland. How is this diagnosed? A thyroid nodule may be felt by your health care provider during a physical exam. This condition may also be diagnosed based on your symptoms. You may also have tests, including: Blood tests to check how well your thyroid is working. An ultrasound. This may be done to confirm the diagnosis. A biopsy. This involves taking a sample from the nodule and looking at it under a microscope. A thyroid scan. This test creates an image of the thyroid gland using a radioactive tracer. Imaging tests such as an MRI or CT scan. These may be done if: A nodule is large. A nodule is blocking your airway. Cancer is suspected. How is this treated? Treatment depends on the cause and size of your nodule or nodules. If a nodule is benign, treatment may not be necessary. Your health care provider may monitor the nodule to see if it goes away without treatment. If a nodule continues to grow, is cancerous, or does not go away, treatment may be needed. Treatment may include: Having a cystic nodule drained with a needle. Ablation therapy. In this treatment, alcohol is injected into the area of the nodule to destroy the cells. Ablation with heat may also be used. This is called thermal ablation. Radioactive iodine. In this treatment, radioactive iodine is given as a pill or liquid that you drink. This substance causes the thyroid nodule to shrink. Surgery to remove the nodule or nodules. Part or all of your thyroid gland may also need to be removed. Medicines to treat hyperthyroidism. Follow these instructions at home: Pay attention to any changes in your thyroid nodule or nodules. Take over-the-counter  and prescription medicines only as told by your health care provider. Keep all follow-up visits. This is important. Contact a health care provider if: You have trouble sleeping. You have muscle weakness. You have  significant weight loss without changing your eating habits. You feel nervous. You have trouble swallowing. You have increased swelling. You have a rapid or irregular heartbeat. Get help right away if: You have chest pain. You faint or lose consciousness. Your nodule makes it hard for you to breathe. These symptoms may be an emergency. Get help right away. Call 911. Do not wait to see if the symptoms will go away. Do not drive yourself to the hospital. Summary A thyroid nodule is an isolated growth of thyroid cells that forms a lump in your thyroid gland. Thyroid nodules are common. Most are not cancerous. Your health care provider may monitor the nodule to see if it goes away without treatment. If a nodule continues to grow, is cancerous, or does not go away, treatment may be needed. Treatment depends on the cause and size of your nodule or nodules. This information is not intended to replace advice given to you by your health care provider. Make sure you discuss any questions you have with your health care provider. Document Revised: 04/21/2021 Document Reviewed: 04/21/2021 Elsevier Patient Education  2024 [employer].

## 2024-03-27 NOTE — Progress Notes (Signed)
 Endocrinology Consult Note 03/27/24    ---------------------------------------------------------------------------------------------------------------------- Subjective    Past Medical History:  Diagnosis Date   Acid reflux    Anemia    Bloating    Childhood asthma    Eczema    Esophageal dysmotility    History of blood transfusion    Positive urine pregnancy test 12/14/2018   Preeclampsia    Seasonal allergies     Past Surgical History:  Procedure Laterality Date   BIOPSY  02/26/2021   Procedure: BIOPSY;  Surgeon: Golda Claudis PENNER, MD;  [facility];  Service: Endoscopy;;  Esophageal biopsy   carpal tunnel Left 01/30/2021   CARPAL TUNNEL RELEASE Right 03/27/2021   CESAREAN SECTION N/A 01/02/2017   Procedure: CESAREAN SECTION;  Surgeon: Lorence Ozell CROME, MD;  Location: [facility];  Service: Obstetrics;  Laterality: N/A;   CHOLECYSTECTOMY  2011   Dr.Byerly   COLONOSCOPY WITH PROPOFOL  N/A 05/21/2022   Procedure: COLONOSCOPY WITH PROPOFOL ;  Surgeon: Eartha Angelia Sieving, MD;  [facility]: [facility];  Service: Gastroenterology;  Laterality: N/A;  2:00pm, asa 2   ESOPHAGEAL DILATION N/A 02/26/2021   Procedure: ESOPHAGEAL DILATION;  Surgeon: Golda Claudis PENNER, MD;  [facility];  Service: Endoscopy;  Laterality: N/A;   ESOPHAGEAL DILATION N/A 08/19/2022   Procedure: ESOPHAGEAL DILATION;  Surgeon: Eartha Angelia Sieving, MD;  [facility];  Service: Gastroenterology;  Laterality: N/A;   ESOPHAGOGASTRODUODENOSCOPY N/A 10/26/2014   Procedure: ESOPHAGOGASTRODUODENOSCOPY (EGD);  Surgeon: Claudis PENNER Golda, MD;  [facility];  Service: Endoscopy;  Laterality: N/A;   ESOPHAGOGASTRODUODENOSCOPY N/A 10/06/2023   Procedure: EGD (ESOPHAGOGASTRODUODENOSCOPY);  Surgeon: Eartha Angelia, Sieving, MD;  [facility];  Service: Gastroenterology;  Laterality: N/A;  10:30AM;ASA 1   ESOPHAGOGASTRODUODENOSCOPY (EGD) WITH PROPOFOL  N/A  02/26/2021   Procedure: ESOPHAGOGASTRODUODENOSCOPY (EGD) WITH PROPOFOL ;  Surgeon: Golda Claudis PENNER, MD;  [facility];  Service: Endoscopy;  Laterality: N/A;   ESOPHAGOGASTRODUODENOSCOPY (EGD) WITH PROPOFOL  N/A 08/19/2022   Procedure: ESOPHAGOGASTRODUODENOSCOPY (EGD) WITH PROPOFOL ;  Surgeon: Eartha Angelia Sieving, MD;  [facility];  Service: Gastroenterology;  Laterality: N/A;  200pm, asa 2   POLYPECTOMY  02/26/2021   Procedure: POLYPECTOMY;  Surgeon: Golda Claudis PENNER, MD;  [facility];  Service: Endoscopy;;  Gastric Body Polyps x 3    TYMPANOSTOMY TUBE PLACEMENT     UPPER GASTROINTESTINAL ENDOSCOPY  12/05/2009   UPPER GASTROINTESTINAL ENDOSCOPY  02/07/2009   WISDOM TOOTH EXTRACTION     WISDOM TOOTH EXTRACTION      Social History   Socioeconomic History   Marital status: Married    Spouse name: Not on file   Number of children: 1   Years of education: Not on file   Highest education level: GED or equivalent  Occupational History   Not on file  Tobacco Use   Smoking status: Former    Current packs/day: 0.00    Average packs/day: 0.5 packs/day for 12.0 years (6.0 ttl pk-yrs)    Types: Cigarettes    Start date: 05/05/2004    Quit date: 05/05/2016    Years since quitting: 7.8    Passive exposure: Past   Smokeless tobacco: Never  Vaping Use   Vaping status: Never Used  Substance and Sexual Activity   Alcohol use: No    Alcohol/week: 0.0 standard drinks of alcohol   Drug use: No   Sexual activity: Yes    Birth control/protection: Condom  Other Topics Concern   Not on  file  Social History Narrative   Not on file   Social Drivers of Health   Financial Resource Strain: Low Risk  (03/20/2024)   Overall Financial Resource Strain (CARDIA)    Difficulty of Paying Living Expenses: Not hard at all  Food Insecurity: No Food Insecurity (03/20/2024)   Hunger Vital Sign    Worried About Running Out of Food in the Last Year: Never true    Ran Out  of Food in the Last Year: Never true  Transportation Needs: No Transportation Needs (03/20/2024)   PRAPARE - Administrator, Civil Service (Medical): No    Lack of Transportation (Non-Medical): No  Physical Activity: Sufficiently Active (03/20/2024)   Exercise Vital Sign    Days of Exercise per Week: 5 days    Minutes of Exercise per Session: 30 min  Stress: No Stress Concern Present (03/20/2024)   [employer] of Occupational Health - Occupational Stress Questionnaire    Feeling of Stress: Not at all  Social Connections: Moderately Isolated (03/20/2024)   Social Connection and Isolation Panel    Frequency of Communication with Friends and Family: More than three times a week    Frequency of Social Gatherings with Friends and Family: Once a week    Attends Religious Services: Never    Database administrator or Organizations: No    Attends Banker Meetings: Never    Marital Status: Married  Catering manager Violence: Not At Risk (03/20/2024)   Humiliation, Afraid, Rape, and Kick questionnaire    Fear of Current or Ex-Partner: No    Emotionally Abused: No    Physically Abused: No    Sexually Abused: No    Current Outpatient Medications on File Prior to Visit  Medication Sig Dispense Refill   acetaminophen  (TYLENOL ) 500 MG tablet Take 1,000 mg by mouth every 8 (eight) hours as needed for moderate pain. TAKES IN LIQUID FORM     esomeprazole  (NEXIUM ) 40 MG capsule Take 1 capsule (40 mg total) by mouth daily before breakfast. 30 capsule 11   famotidine  (PEPCID ) 40 MG tablet Take 40 mg by mouth daily.     linaclotide  (LINZESS ) 72 MCG capsule Take 1 capsule (72 mcg total) by mouth daily before breakfast. 90 capsule 3   fluticasone  (FLONASE ) 50 MCG/ACT nasal spray Place 2 sprays into both nostrils daily. (Patient not taking: Reported on 03/27/2024) 16 g 6   nystatin  (MYCOSTATIN /NYSTOP ) powder Daily at bedtime (Patient not taking: Reported on 03/27/2024) 15 g 11   No  current facility-administered medications on file prior to visit.      HPI   BRAYLEA BRANCATO is a 33 y.o.-year-old female, referred by her PCP, Annabella Search, FNP , for evaluation for multinodular goiter.  Thyroid  U/S: 09/09/23 CLINICAL DATA:  Thyroid  nodule   EXAM: THYROID  ULTRASOUND   TECHNIQUE: Ultrasound examination of the thyroid  gland and adjacent soft tissues was performed.   COMPARISON:  03/20/2022   FINDINGS: Parenchymal Echotexture: Mildly heterogenous   Isthmus: 2 mm   Right lobe: 5.0 x 1.5 x 1.7 cm   Left lobe: 5.2 x 1.4 x 1.4 cm   _________________________________________________________   Estimated total number of nodules >/= 1 cm: 1   Number of spongiform nodules >/=  2 cm not described below (TR1): 0   Number of mixed cystic and solid nodules >/= 1.5 cm not described below (TR2): 0   _________________________________________________________   Nodule # 1:   Location: Isthmus; left   Maximum size:  1.5 cm; Other 2 dimensions: 1.2 x 0.8 cm   Composition: solid/almost completely solid (2)   Echogenicity: hypoechoic (2)   Shape: not taller-than-wide (0)   Margins: ill-defined (0)   Echogenic foci: none (0)   ACR TI-RADS total points: 4.   ACR TI-RADS risk category: TR4 (4-6 points).   ACR TI-RADS recommendations:   **Given size (>/= 1.5 cm) and appearance, fine needle aspiration of this moderately suspicious nodule should be considered based on TI-RADS criteria.   _________________________________________________________   No additional thyroid  abnormality. Normal vascularity. No regional adenopathy.   IMPRESSION: Slightly larger 1.5 cm left isthmus TR 4 nodule now meets criteria for biopsy as above.   The above is in keeping with the ACR TI-RADS recommendations - J Am Coll Radiol 2017;14:587-595.   I reviewed pt's thyroid  tests: Lab Results  Component Value Date   TSH 1.170 03/13/2024   TSH 1.100 08/26/2023   TSH 1.270  10/09/2022   TSH 1.630 06/17/2021   TSH 1.130 04/14/2021   TSH 1.150 10/30/2020   TSH 1.640 03/28/2019   TSH 1.960 07/20/2017   TSH 3.687 10/26/2014   FREET4 1.07 08/26/2023     Pt c/o: - fatigue - heat intolerance-waking up in sweats intermittently for several weeks - dysphagia - painful swallowing - hoarseness - feeling nodule in neck  Pt denies - choking - SOB with lying down  She notes her sister has thyroid  problems (thinks under-active). No FH of thyroid  cancer. No h/o radiation tx to head or neck.  No seaweed or kelp. No recent contrast studies. No steroid use. No herbal supplements. No Biotin supplements or Hair, Skin and Nails vitamins.  Pt also has a history of GERD, anxiety, iron deficiency anemia, ADHD, bipolar disorder, IBS, carpal tunnel, migraines.  Review of systems  Constitutional: + Minimally fluctuating body weight,  current Body mass index is 41.19 kg/m. , + fatigue, + subjective hyperthermia, no subjective hypothermia Eyes: no blurry vision, no xerophthalmia ENT: no sore throat, + nodules palpated in throat- says this waxes and wanes, + dysphagia/odynophagia, + hoarseness Cardiovascular: no chest pain, no shortness of breath, no palpitations, no leg swelling Respiratory: no cough, no shortness of breath Gastrointestinal: no nausea/vomiting/diarrhea Musculoskeletal: no muscle/joint aches Skin: no rashes, no hyperemia Neurological: no tremors, no numbness, no tingling, no dizziness Psychiatric: no depression, no anxiety    ---------------------------------------------------------------------------------------------------------------------- Objective    BP 116/80 (BP Location: Right Arm, Patient Position: Sitting)   Pulse (!) 59   Ht 5' 1 (1.549 m)   Wt 218 lb (98.9 kg)   LMP 03/02/2024 (Exact Date)   BMI 41.19 kg/m    BP Readings from Last 3 Encounters:  03/27/24 116/80  03/20/24 132/85  03/13/24 116/76    Wt Readings from Last 3  Encounters:  03/27/24 218 lb (98.9 kg)  03/20/24 216 lb (98 kg)  03/13/24 215 lb (97.5 kg)     Physical Exam- Limited  Constitutional:  Body mass index is 41.19 kg/m. , not in acute distress, normal state of mind Eyes:  EOMI, no exophthalmos Neck: Supple Thyroid : + mild gross goiter Cardiovascular: RRR, + murmur (says she had one as a child but was told it had resolved), rubs, or gallops, no edema Respiratory: Adequate breathing efforts, no crackles, rales, rhonchi, or wheezing Musculoskeletal: no gross deformities, strength intact in all four extremities, no gross restriction of joint movements Skin:  no rashes, no hyperemia Neurological: no tremor with outstretched hands, DTR to BLE normal    FNA  of left isthmus nodule 09/22/23 CYTOLOGY - NON PAP  CASE: [id number]  PATIENT: Mckenzie Cooley  Non-Gynecological Cytology Report      Clinical History: Left isthmus nodule 1.22 x .82 x 1.54 cm TIRAD 4  nodule 1  Specimen Submitted:  A. THYROID , LML ISTHMUS, FINE NEEDLE ASPIRATION    FINAL MICROSCOPIC DIAGNOSIS:  - Scant follicular epithelium present (Bethesda category I)   SPECIMEN ADEQUACY:  Satisfactory but limited for evaluation, scant cellularity   ----------------------------------------------------------------------------------------------------------------------  ASSESSMENT / PLAN:  1. Thyroid  Nodule 2. Family history of thyroid  dysfunction  - I reviewed the images of her thyroid  ultrasound along with the patient. She did have FNA which showed scan epithelium present, thus malignancy could not be ruled out.  Will plan to repeat thyroid  ultrasound by March 2026 to assess for changes in nodule and determine at that point whether repeat FNA is necessary.  Will also check more comprehensive thyroid  panel, had some antibodies recently checked, but not resulted in the computer.  Will call patient with results and next steps.   *I did recommend patient follow up with  PCP regarding murmur heard on exam today, as this is a new finding (previously told it had resolved).  She does note some recent uncontrolled BP issues as well.    Follow Up Plan: Return labs today, will call with results and next steps, for Thyroid  follow up, Previsit labs.    I spent 45 minutes in the care of the patient today including review of labs from Thyroid  Function, CMP, and other relevant labs ; imaging/biopsy records (current and previous including abstractions from other facilities); face-to-face time discussing  her lab results and symptoms, medications doses, her options of short and long term treatment based on the latest standards of care / guidelines;   and documenting the encounter.  Levon JONELLE Lunger  participated in the discussions, expressed understanding, and voiced agreement with the above plans.  All questions were answered to her satisfaction. she is encouraged to contact clinic should she have any questions or concerns prior to her return visit.    Benton Rio, [facility] [address] [place], [place] [address] Phone: [phone] Fax: [phone]

## 2024-03-28 ENCOUNTER — Ambulatory Visit: Payer: Self-pay | Admitting: Nurse Practitioner

## 2024-03-28 LAB — T4, FREE: Free T4: 1 ng/dL (ref 0.82–1.77)

## 2024-03-28 LAB — TSH: TSH: 1.21 u[IU]/mL (ref 0.450–4.500)

## 2024-03-28 LAB — VITAMIN D 25 HYDROXY (VIT D DEFICIENCY, FRACTURES): Vit D, 25-Hydroxy: 33.3 ng/mL (ref 30.0–100.0)

## 2024-03-28 LAB — THYROGLOBULIN ANTIBODY: Thyroglobulin Antibody: <1 [IU]/mL (ref 0.0–0.9)

## 2024-03-28 LAB — T3, FREE: T3, Free: 3.4 pg/mL (ref 2.0–4.4)

## 2024-03-28 LAB — THYROID PEROXIDASE ANTIBODY: Thyroperoxidase Ab SerPl-aCnc: <9 [IU]/mL (ref 0–34)

## 2024-03-28 NOTE — Progress Notes (Signed)
 FYI: I sent mychart message going over thyroid labs.

## 2024-04-05 ENCOUNTER — Encounter (INDEPENDENT_AMBULATORY_CARE_PROVIDER_SITE_OTHER): Payer: Self-pay | Admitting: Gastroenterology

## 2024-04-06 ENCOUNTER — Ambulatory Visit: Admitting: Family Medicine

## 2024-04-13 ENCOUNTER — Encounter: Payer: Self-pay | Admitting: Family Medicine

## 2024-04-13 ENCOUNTER — Ambulatory Visit (INDEPENDENT_AMBULATORY_CARE_PROVIDER_SITE_OTHER): Admitting: Family Medicine

## 2024-04-13 VITALS — BP 144/75 | HR 48 | Temp 98.4°F | Ht 61.0 in | Wt 214.8 lb

## 2024-04-13 DIAGNOSIS — H00014 Hordeolum externum left upper eyelid: Secondary | ICD-10-CM | POA: Diagnosis not present

## 2024-04-13 DIAGNOSIS — R011 Cardiac murmur, unspecified: Secondary | ICD-10-CM | POA: Diagnosis not present

## 2024-04-13 DIAGNOSIS — E041 Nontoxic single thyroid nodule: Secondary | ICD-10-CM | POA: Diagnosis not present

## 2024-04-13 DIAGNOSIS — R002 Palpitations: Secondary | ICD-10-CM

## 2024-04-13 DIAGNOSIS — R030 Elevated blood-pressure reading, without diagnosis of hypertension: Secondary | ICD-10-CM

## 2024-04-13 NOTE — Progress Notes (Signed)
 Acute Office Visit  Subjective:     Patient ID: Mckenzie Cooley, female    DOB: July 20, 1990, 34 y.o.   MRN: [id number]  Chief Complaint  Patient presents with   Palpitations    HPI  History of Present Illness   Mckenzie Cooley is a 33 year old female who presents for evaluation of a heart murmur and elevated blood pressure.  Cardiac murmur and palpitations - Heart murmur detected during recent endocrinology visit - History of childhood heart murmur, reportedly resolved - Evaluated by cardiology in 2019 for sharp chest pain; echocardiogram at that time was normal - Palpitations described as 'fluttering' occurring approximately three times per week, typically brief in duration - Palpitations often triggered by stress or physical activity at work - Occasional mild shortness of breath associated with palpitations - No associated chest pain with palpitations  Elevated blood pressure - Blood pressure fluctuating over recent months - Normal blood pressure readings in September and early October - Elevated blood pressure in August and during today's visit  Fatigue and exertional symptoms - Persistent fatigue, described as 'always tired,' especially after work - No significant shortness of breath during regular activities - Fatigue with exertion  Ocular symptoms - Stye on right eye causing tenderness - Right eye watering - Warm compresses used for symptom management       ROS As per HPI.      Objective:    BP (!) 144/75   Pulse (!) 48   Temp 98.4 F (36.9 C) (Temporal)   Ht 5' 1 (1.549 m)   Wt 214 lb 12.8 oz (97.4 kg)   SpO2 100%   BMI 40.59 kg/m  BP Readings from Last 3 Encounters:  04/13/24 (!) 144/75  03/27/24 116/80  03/20/24 132/85      Physical Exam Vitals and nursing note reviewed.  Constitutional:      General: She is not in acute distress.    Appearance: She is obese. She is not ill-appearing, toxic-appearing or diaphoretic.  Eyes:     General:  Lids are everted, no foreign bodies appreciated. Vision grossly intact. Gaze aligned appropriately.        Left eye: Hordeolum present.No foreign body or discharge.     Extraocular Movements: Extraocular movements intact.  Cardiovascular:     Rate and Rhythm: Normal rate and regular rhythm.     Heart sounds: Murmur heard.     Systolic murmur is present with a grade of 3/6.  Pulmonary:     Effort: Pulmonary effort is normal. No respiratory distress.     Breath sounds: Normal breath sounds. No wheezing, rhonchi or rales.  Musculoskeletal:     Cervical back: Neck supple.     Right lower leg: No edema.     Left lower leg: No edema.  Skin:    General: Skin is warm and dry.  Neurological:     General: No focal deficit present.     Mental Status: She is alert and oriented to person, place, and time.  Psychiatric:        Mood and Affect: Mood normal.        Behavior: Behavior normal.     No results found for any visits on 04/13/24.      Assessment & Plan:   Mckenzie Cooley was seen today for palpitations.  Diagnoses and all orders for this visit:  Palpitations -     EKG 12-Lead -     Ambulatory referral to Cardiology  Heart murmur -  Ambulatory referral to Cardiology  Elevated blood pressure reading without diagnosis of hypertension  Hordeolum externum of left upper eyelid  Thyroid  nodule  Assessment and Plan    Palpitations and heart murmur EKG today with sinus brady. Referral to cardiology for further evaluation.  - Refer to cardiology for heart murmur and palpitations evaluation.  Elevated blood pressure without a diagnosis of hypertension - Instruct to monitor blood pressure at home once daily when relaxed and send readings via MyChart. - Educated on proper blood pressure measurement technique. - Discussed factors affecting blood pressure such as stress, illness, and caffeine intake.  Stye of left upper eyelid No signs of infection.  - Apply warm compress to eyelid  three times daily. - Monitor for infection signs and report if present.     Thyroid  nodule Continue follow up with endocrinology. Has US  planned for March and potentially repeat biopsy.   Return to office for new or worsening symptoms, or if symptoms persist.   The patient indicates understanding of these issues and agrees with the plan.  Mckenzie CHRISTELLA Search, FNP

## 2024-04-13 NOTE — Addendum Note (Signed)
 Addended by: JOESPH ANNABELLA HERO on: 04/13/2024 04:12 PM   Modules accepted: Level of Service

## 2024-04-27 DIAGNOSIS — M5416 Radiculopathy, lumbar region: Secondary | ICD-10-CM | POA: Diagnosis not present

## 2024-05-14 ENCOUNTER — Encounter: Payer: Self-pay | Admitting: Family Medicine

## 2024-05-14 ENCOUNTER — Encounter: Payer: Self-pay | Admitting: Nurse Practitioner

## 2024-05-15 ENCOUNTER — Other Ambulatory Visit: Payer: Self-pay | Admitting: Nurse Practitioner

## 2024-05-15 DIAGNOSIS — E041 Nontoxic single thyroid nodule: Secondary | ICD-10-CM

## 2024-05-15 DIAGNOSIS — Z8349 Family history of other endocrine, nutritional and metabolic diseases: Secondary | ICD-10-CM

## 2024-05-26 ENCOUNTER — Ambulatory Visit (HOSPITAL_COMMUNITY): Admission: RE | Admit: 2024-05-26 | Discharge: 2024-05-26 | Attending: Nurse Practitioner

## 2024-05-26 DIAGNOSIS — Z8349 Family history of other endocrine, nutritional and metabolic diseases: Secondary | ICD-10-CM | POA: Diagnosis not present

## 2024-05-26 DIAGNOSIS — E041 Nontoxic single thyroid nodule: Secondary | ICD-10-CM | POA: Diagnosis not present

## 2024-05-30 ENCOUNTER — Ambulatory Visit: Payer: Self-pay | Admitting: Nurse Practitioner

## 2024-05-30 DIAGNOSIS — E041 Nontoxic single thyroid nodule: Secondary | ICD-10-CM

## 2024-05-30 NOTE — Progress Notes (Signed)
 FYI: I sent mychart message going over thyroid ultrasound results.

## 2024-05-30 NOTE — Progress Notes (Unsigned)
 Jennefer Ester PARAS, MD  Baldwin Rosella L PROCEDURE / BIOPSY REVIEW Date: 05/30/24  Requested Biopsy site: Thyroid  nodule 1 (isthmus) Reason for request: Bethesda I Imaging review: Best seen on Thyroid  US  05/26/24  Decision: Approved Imaging modality to perform: Ultrasound Schedule with: No sedation / Local anesthetic Schedule for: Any VIR  Additional comments: none  Please contact me with questions, concerns, or if issue pertaining to this request arise.  Ester PARAS Jennefer, MD [facility]       Previous Messages    ----- Message ----- From: Baldwin Rosella CROME Sent: 05/30/2024   9:49 AM EST To: Rosella CROME Baldwin; Taryn F Rigney, RT; Ir Proc* Subject: US  FNA BX THYROID  1ST LESION AFIRMA            Procedure :US  FNA BX THYROID  1ST LESION AFIRMA  Reason :growing 1.7 cm isthmus nodule- previously biopsied but not enough epithelial tissue present to rule out malignancy Dx: Thyroid  nodule [E04.1 (ICD-10-CM)]    History :US  THYROID   Provider: Therisa Benton PARAS, NP

## 2024-06-09 ENCOUNTER — Ambulatory Visit (HOSPITAL_COMMUNITY)

## 2024-06-21 ENCOUNTER — Ambulatory Visit: Admitting: Cardiology

## 2024-06-28 ENCOUNTER — Other Ambulatory Visit: Payer: Self-pay | Admitting: Adult Health

## 2024-06-28 ENCOUNTER — Encounter: Payer: Self-pay | Admitting: *Deleted

## 2024-06-28 ENCOUNTER — Ambulatory Visit (INDEPENDENT_AMBULATORY_CARE_PROVIDER_SITE_OTHER): Admitting: *Deleted

## 2024-06-28 DIAGNOSIS — Z3201 Encounter for pregnancy test, result positive: Secondary | ICD-10-CM

## 2024-06-28 LAB — POCT URINE PREGNANCY: Preg Test, Ur: POSITIVE — AB

## 2024-06-28 MED ORDER — ONDANSETRON HCL 4 MG PO TABS: 4.0000 mg | ORAL_TABLET | Freq: Three times a day (TID) | ORAL | 1 refills | Status: AC | PRN

## 2024-06-28 NOTE — Progress Notes (Signed)
" ° °  NURSE VISIT- PREGNANCY CONFIRMATION   SUBJECTIVE:  Mckenzie Cooley is a 34 y.o. [phone] female at Unknown by uncertain LMP of No LMP recorded (lmp unknown). Patient is pregnant. Here for pregnancy confirmation.  Home pregnancy test: positive x 2  She reports nausea.  She is not taking prenatal vitamins.  Pt thinks she had a period around 10/18 but also had some bleeding around thanksgiving. Will get HCG level to confirm dates so that ultrasound can be scheduled.   OBJECTIVE:  LMP  (LMP Unknown)   Appears well, in no apparent distress  Results for orders placed or performed in visit on 06/28/24 (from the past 24 hours)  POCT urine pregnancy   Collection Time: 06/28/24  8:56 AM  Result Value Ref Range   Preg Test, Ur Positive (A) Negative    ASSESSMENT: Positive pregnancy test, Unknown by LMP    PLAN: Schedule for dating ultrasound in pending HCG  Prenatal vitamins: plans to begin OTC ASAP   Nausea medicines: requested-note routed to [facility] to send prescription   OB packet given: Yes  Alan LITTIE Fischer  06/28/2024 8:57 AM  "

## 2024-06-28 NOTE — Progress Notes (Signed)
 Rx zofran

## 2024-06-29 ENCOUNTER — Ambulatory Visit: Payer: Self-pay | Admitting: Adult Health

## 2024-06-29 DIAGNOSIS — Z3201 Encounter for pregnancy test, result positive: Secondary | ICD-10-CM

## 2024-06-29 LAB — BETA HCG QUANT (REF LAB): hCG Quant: 84 m[IU]/mL

## 2024-07-01 LAB — BETA HCG QUANT (REF LAB): hCG Quant: 163 m[IU]/mL

## 2024-07-10 ENCOUNTER — Ambulatory Visit (HOSPITAL_COMMUNITY)
Admission: RE | Admit: 2024-07-10 | Discharge: 2024-07-10 | Disposition: A | Discharge status: Home / Self Care | Source: Ambulatory Visit | Attending: Nurse Practitioner | Admitting: Nurse Practitioner

## 2024-07-10 NOTE — Progress Notes (Signed)
 Called patient due to late arrival. Pt stated she cancelled procedure in her mychart due to finding out she was pregnant. US  staff made aware.

## 2024-07-18 ENCOUNTER — Ambulatory Visit: Payer: Self-pay

## 2024-07-18 ENCOUNTER — Ambulatory Visit

## 2024-07-18 NOTE — Telephone Encounter (Signed)
 FYI Only or Action Required?: FYI only for provider: ED advised.  Patient was last seen in primary care on 04/13/2024 by Joesph Annabella HERO, FNP.  Called Nurse Triage reporting Urinary Frequency and Pelvic Pain.  Symptoms began several days ago.  Interventions attempted: Nothing.  Symptoms are: gradually worsening.  Triage Disposition: Go to ED Now (or PCP Triage)  Patient/caregiver understands and will follow disposition?: No, refuses disposition

## 2024-07-18 NOTE — Telephone Encounter (Signed)
 Ed advised

## 2024-07-18 NOTE — Telephone Encounter (Signed)
" ° ° ° ° °  Reason for Triage:  PT has Possible UTI issue/severe pelvic and back pain for 3 days.   Reason for Disposition  Side (flank) or lower back pain present  Answer Assessment - Initial Assessment Questions Pt states she has a uti x 3 days. [facility] that [facility] is closer and she would rather just do the testing there. She states she'd like to keep her baby safe since the roads aren't great and she is [redacted] weeks pregnant. She states she is having lower back pain, vaginal pain, pelvic pain, cloudy urine, lots of pain and pressure.  Due to low back pain recommendations are for pt to go to the ER. Pt refused states she just wants to come in to give a urine sample. [facility] notified and stated they have no available appts for today. Rn did return call to pt to notify her of this and recommend ER again. During initial recommendation RN did give rationale.  Due to ER recommendation, not all triage questions answered.     1. SEVERITY: How bad is the pain?        2. FREQUENCY: How many times have you had painful urination today?       3. PATTERN: Is pain present every time you urinate or just sometimes?       4. ONSET: When did the painful urination start?      3 days 5. FEVER: Do you have a fever? If Yes, ask: What is your temperature, how was it measured, and when did it start?      6. CAUSE: What do you think is causing the painful urination?      uti 7. OTHER SYMPTOMS: Do you have any other symptoms? (e.g., flank or back pain, contractions, blood in urine)     Back pain, pelvic pain, vaginal pain/pressure, cloudy urine 8. PREGNANCY: How many weeks pregnant are you?     7 weeks  9. EDD: What date are you expecting to deliver?  Protocols used: Pregnancy - Urination Pain-A-AH  "

## 2024-07-20 ENCOUNTER — Other Ambulatory Visit: Payer: Self-pay | Admitting: Adult Health

## 2024-07-20 DIAGNOSIS — O3680X0 Pregnancy with inconclusive fetal viability, not applicable or unspecified: Secondary | ICD-10-CM

## 2024-07-20 DIAGNOSIS — Z789 Other specified health status: Secondary | ICD-10-CM

## 2024-07-25 ENCOUNTER — Encounter: Payer: Self-pay | Admitting: Women's Health

## 2024-07-25 ENCOUNTER — Encounter: Payer: Self-pay | Admitting: Radiology

## 2024-07-25 ENCOUNTER — Ambulatory Visit: Admitting: Radiology

## 2024-07-25 ENCOUNTER — Other Ambulatory Visit: Payer: Self-pay | Admitting: Women's Health

## 2024-07-25 ENCOUNTER — Ambulatory Visit

## 2024-07-25 VITALS — BP 157/94

## 2024-07-25 DIAGNOSIS — O10919 Unspecified pre-existing hypertension complicating pregnancy, unspecified trimester: Secondary | ICD-10-CM | POA: Insufficient documentation

## 2024-07-25 DIAGNOSIS — O3680X0 Pregnancy with inconclusive fetal viability, not applicable or unspecified: Secondary | ICD-10-CM

## 2024-07-25 DIAGNOSIS — Z789 Other specified health status: Secondary | ICD-10-CM

## 2024-07-25 DIAGNOSIS — O161 Unspecified maternal hypertension, first trimester: Secondary | ICD-10-CM | POA: Diagnosis not present

## 2024-07-25 DIAGNOSIS — Z3A08 8 weeks gestation of pregnancy: Secondary | ICD-10-CM

## 2024-07-25 DIAGNOSIS — Z0130 Encounter for examination of blood pressure without abnormal findings: Secondary | ICD-10-CM

## 2024-07-25 MED ORDER — LABETALOL HCL 200 MG PO TABS: 200.0000 mg | ORAL_TABLET | Freq: Two times a day (BID) | ORAL | 3 refills | Status: AC

## 2024-07-25 NOTE — Progress Notes (Signed)
 US : GA = unknown LMP  (Dating US ) Anteverted uterus with single viable early IUP.  GS intact within upper mid uterine cavity CRL = 16.5 mm = 8+0 weeks,  Yolk Sac = 5.5 mm, FHR = 166 bpm Normal ov's,  Neg adnexal regions - neg CDS - no free fluid present EDD = 03-06-25 by early US 

## 2024-07-25 NOTE — Progress Notes (Addendum)
" ° °  NURSE VISIT- BLOOD PRESSURE CHECK  SUBJECTIVE:  Mckenzie Cooley is a 34 y.o. [phone] female here for BP check. She is [redacted]w[redacted]d pregnant  Patient having elevated BP readings at home brought home cuff and compared to office cuff. Patient states she does not have a history of hypertension or hypertensive disorders in pregnancy.   HYPERTENSION ROS:  Pregnant/postpartum:  Severe headaches that don't go away with tylenol /other medicines: No  Visual changes (seeing spots/double/blurred vision) No  Severe pain under right breast breast or in center of upper chest No  Severe nausea/vomiting No  Taking medicines as instructed not applicable   OBJECTIVE:  BP (!) 157/94 (BP Location: Left Arm) Comment: home cuff  LMP  (LMP Unknown) Comment: unsure of exact lmp   EDD by early US  = 03-06-25  171/97 right arm home cuff, 153/71 left arm office cuff, 153/84 right arm office cuff. Appearance alert, well appearing, and in no distress.  ASSESSMENT: Pregnancy [redacted]w[redacted]d  blood pressure check  PLAN: Discussed with Luke Fetters, CNM, WHNP   Recommendations: new prescription will be sent  NEEDS TO BE CRUSHABLE OR CHEWABLE CAN NOT SWALLOW PILLS Follow-up: 1 week for BP Check   Aleck FORBES Blase  07/25/2024 12:01 PM  "

## 2024-08-01 ENCOUNTER — Ambulatory Visit

## 2024-08-22 ENCOUNTER — Encounter: Admitting: *Deleted

## 2024-08-22 ENCOUNTER — Encounter: Admitting: Women's Health

## 2024-08-28 ENCOUNTER — Other Ambulatory Visit (HOSPITAL_COMMUNITY)
